# Patient Record
Sex: Female | Born: 1955 | Race: White | Hispanic: No | Smoking: Never smoker
Health system: Southern US, Community
[De-identification: ages and names within clinical notes are randomized; demographics above are authoritative.]

## PROBLEM LIST (undated history)

## (undated) DIAGNOSIS — K219 Gastro-esophageal reflux disease without esophagitis: Secondary | ICD-10-CM

## (undated) DIAGNOSIS — Z8619 Personal history of other infectious and parasitic diseases: Secondary | ICD-10-CM

## (undated) DIAGNOSIS — R000 Tachycardia, unspecified: Secondary | ICD-10-CM

## (undated) DIAGNOSIS — J45909 Unspecified asthma, uncomplicated: Secondary | ICD-10-CM

## (undated) DIAGNOSIS — K449 Diaphragmatic hernia without obstruction or gangrene: Secondary | ICD-10-CM

## (undated) DIAGNOSIS — E079 Disorder of thyroid, unspecified: Secondary | ICD-10-CM

## (undated) DIAGNOSIS — B269 Mumps without complication: Secondary | ICD-10-CM

## (undated) DIAGNOSIS — I471 Supraventricular tachycardia, unspecified: Secondary | ICD-10-CM

## (undated) DIAGNOSIS — G2581 Restless legs syndrome: Secondary | ICD-10-CM

## (undated) DIAGNOSIS — E663 Overweight: Secondary | ICD-10-CM

## (undated) DIAGNOSIS — F419 Anxiety disorder, unspecified: Secondary | ICD-10-CM

## (undated) DIAGNOSIS — D649 Anemia, unspecified: Secondary | ICD-10-CM

## (undated) DIAGNOSIS — N816 Rectocele: Secondary | ICD-10-CM

## (undated) DIAGNOSIS — B019 Varicella without complication: Secondary | ICD-10-CM

## (undated) DIAGNOSIS — F32A Depression, unspecified: Secondary | ICD-10-CM

## (undated) DIAGNOSIS — K5790 Diverticulosis of intestine, part unspecified, without perforation or abscess without bleeding: Secondary | ICD-10-CM

## (undated) DIAGNOSIS — E739 Lactose intolerance, unspecified: Secondary | ICD-10-CM

## (undated) DIAGNOSIS — E782 Mixed hyperlipidemia: Secondary | ICD-10-CM

## (undated) DIAGNOSIS — F329 Major depressive disorder, single episode, unspecified: Secondary | ICD-10-CM

## (undated) DIAGNOSIS — R05 Cough: Secondary | ICD-10-CM

## (undated) DIAGNOSIS — A7740 Ehrlichiosis, unspecified: Secondary | ICD-10-CM

## (undated) DIAGNOSIS — E559 Vitamin D deficiency, unspecified: Secondary | ICD-10-CM

## (undated) DIAGNOSIS — K8020 Calculus of gallbladder without cholecystitis without obstruction: Secondary | ICD-10-CM

## (undated) DIAGNOSIS — G4700 Insomnia, unspecified: Secondary | ICD-10-CM

## (undated) DIAGNOSIS — C4492 Squamous cell carcinoma of skin, unspecified: Secondary | ICD-10-CM

## (undated) DIAGNOSIS — K635 Polyp of colon: Secondary | ICD-10-CM

## (undated) DIAGNOSIS — L659 Nonscarring hair loss, unspecified: Secondary | ICD-10-CM

## (undated) DIAGNOSIS — F4323 Adjustment disorder with mixed anxiety and depressed mood: Secondary | ICD-10-CM

## (undated) DIAGNOSIS — F418 Other specified anxiety disorders: Secondary | ICD-10-CM

## (undated) HISTORY — DX: Anxiety disorder, unspecified: F41.9

## (undated) HISTORY — DX: Nonscarring hair loss, unspecified: L65.9

## (undated) HISTORY — DX: Calculus of gallbladder without cholecystitis without obstruction: K80.20

## (undated) HISTORY — DX: Depression, unspecified: F32.A

## (undated) HISTORY — PX: SKIN SURGERY: SHX2413

## (undated) HISTORY — DX: Cough: R05

## (undated) HISTORY — DX: Diaphragmatic hernia without obstruction or gangrene: K44.9

## (undated) HISTORY — DX: Diverticulosis of intestine, part unspecified, without perforation or abscess without bleeding: K57.90

## (undated) HISTORY — DX: Gastro-esophageal reflux disease without esophagitis: K21.9

## (undated) HISTORY — DX: Mumps without complication: B26.9

## (undated) HISTORY — DX: Lactose intolerance, unspecified: E73.9

## (undated) HISTORY — DX: Anemia, unspecified: D64.9

## (undated) HISTORY — DX: Unspecified asthma, uncomplicated: J45.909

## (undated) HISTORY — DX: Squamous cell carcinoma of skin, unspecified: C44.92

## (undated) HISTORY — DX: Restless legs syndrome: G25.81

## (undated) HISTORY — DX: Adjustment disorder with mixed anxiety and depressed mood: F43.23

## (undated) HISTORY — DX: Personal history of other infectious and parasitic diseases: Z86.19

## (undated) HISTORY — PX: OTHER SURGICAL HISTORY: SHX169

## (undated) HISTORY — DX: Varicella without complication: B01.9

## (undated) HISTORY — DX: Overweight: E66.3

## (undated) HISTORY — DX: Ehrlichiosis, unspecified: A77.40

## (undated) HISTORY — PX: APPENDECTOMY: SHX54

## (undated) HISTORY — DX: Other specified anxiety disorders: F41.8

## (undated) HISTORY — DX: Polyp of colon: K63.5

## (undated) HISTORY — DX: Rectocele: N81.6

## (undated) HISTORY — PX: WISDOM TOOTH EXTRACTION: SHX21

## (undated) HISTORY — PX: INCONTINENCE SURGERY: SHX676

## (undated) HISTORY — DX: Insomnia, unspecified: G47.00

## (undated) HISTORY — DX: Major depressive disorder, single episode, unspecified: F32.9

## (undated) HISTORY — DX: Tachycardia, unspecified: R00.0

## (undated) HISTORY — DX: Disorder of thyroid, unspecified: E07.9

## (undated) HISTORY — DX: Mixed hyperlipidemia: E78.2

## (undated) HISTORY — PX: CHOLECYSTECTOMY: SHX55

## (undated) HISTORY — DX: Vitamin D deficiency, unspecified: E55.9

---
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
## 2004-12-04 HISTORY — PX: ESOPHAGOGASTRODUODENOSCOPY: SHX1529

## 2004-12-04 HISTORY — PX: COLONOSCOPY: SHX174

## 2005-02-28 ENCOUNTER — Other Ambulatory Visit: Admission: RE | Admit: 2005-02-28 | Discharge: 2005-02-28 | Payer: Self-pay | Admitting: Obstetrics and Gynecology

## 2005-12-04 DIAGNOSIS — K449 Diaphragmatic hernia without obstruction or gangrene: Secondary | ICD-10-CM

## 2005-12-04 HISTORY — DX: Diaphragmatic hernia without obstruction or gangrene: K44.9

## 2005-12-04 LAB — HM COLONOSCOPY

## 2006-03-22 ENCOUNTER — Encounter (INDEPENDENT_AMBULATORY_CARE_PROVIDER_SITE_OTHER): Payer: Self-pay | Admitting: *Deleted

## 2006-03-22 ENCOUNTER — Ambulatory Visit (HOSPITAL_BASED_OUTPATIENT_CLINIC_OR_DEPARTMENT_OTHER): Admission: RE | Admit: 2006-03-22 | Discharge: 2006-03-22 | Payer: Self-pay | Admitting: Orthopedic Surgery

## 2006-03-28 ENCOUNTER — Other Ambulatory Visit: Admission: RE | Admit: 2006-03-28 | Discharge: 2006-03-28 | Payer: Self-pay | Admitting: Obstetrics and Gynecology

## 2006-04-09 ENCOUNTER — Ambulatory Visit: Payer: Self-pay | Admitting: Internal Medicine

## 2006-04-18 ENCOUNTER — Ambulatory Visit (HOSPITAL_COMMUNITY): Payer: Self-pay | Admitting: Oncology

## 2006-04-18 ENCOUNTER — Encounter: Admission: RE | Admit: 2006-04-18 | Discharge: 2006-04-18 | Payer: Self-pay | Admitting: Oncology

## 2006-05-04 ENCOUNTER — Ambulatory Visit: Payer: Self-pay | Admitting: Internal Medicine

## 2006-05-04 ENCOUNTER — Observation Stay (HOSPITAL_COMMUNITY): Admission: AD | Admit: 2006-05-04 | Discharge: 2006-05-04 | Payer: Self-pay | Admitting: Internal Medicine

## 2006-05-28 ENCOUNTER — Ambulatory Visit: Payer: Self-pay | Admitting: Internal Medicine

## 2006-06-20 ENCOUNTER — Encounter: Admission: RE | Admit: 2006-06-20 | Discharge: 2006-06-20 | Payer: Self-pay | Admitting: Oncology

## 2006-07-30 ENCOUNTER — Observation Stay (HOSPITAL_COMMUNITY): Admission: EM | Admit: 2006-07-30 | Discharge: 2006-07-30 | Payer: Self-pay | Admitting: Emergency Medicine

## 2006-09-26 ENCOUNTER — Ambulatory Visit: Payer: Self-pay | Admitting: Internal Medicine

## 2006-11-30 ENCOUNTER — Encounter (INDEPENDENT_AMBULATORY_CARE_PROVIDER_SITE_OTHER): Payer: Self-pay | Admitting: *Deleted

## 2006-11-30 ENCOUNTER — Ambulatory Visit (HOSPITAL_COMMUNITY): Admission: RE | Admit: 2006-11-30 | Discharge: 2006-11-30 | Payer: Self-pay | Admitting: Obstetrics and Gynecology

## 2006-12-26 ENCOUNTER — Encounter (HOSPITAL_COMMUNITY): Admission: RE | Admit: 2006-12-26 | Discharge: 2007-01-25 | Payer: Self-pay | Admitting: Oncology

## 2006-12-26 ENCOUNTER — Ambulatory Visit (HOSPITAL_COMMUNITY): Payer: Self-pay | Admitting: Oncology

## 2007-07-23 ENCOUNTER — Encounter: Admission: RE | Admit: 2007-07-23 | Discharge: 2007-07-23 | Payer: Self-pay | Admitting: Internal Medicine

## 2007-12-16 ENCOUNTER — Encounter: Admission: RE | Admit: 2007-12-16 | Discharge: 2007-12-16 | Payer: Self-pay | Admitting: Rheumatology

## 2008-10-12 ENCOUNTER — Ambulatory Visit: Payer: Self-pay | Admitting: Internal Medicine

## 2008-11-03 HISTORY — PX: ABDOMINAL HYSTERECTOMY: SHX81

## 2008-11-10 ENCOUNTER — Ambulatory Visit (HOSPITAL_COMMUNITY): Admission: RE | Admit: 2008-11-10 | Discharge: 2008-11-11 | Payer: Self-pay | Admitting: Obstetrics and Gynecology

## 2008-11-10 ENCOUNTER — Encounter (INDEPENDENT_AMBULATORY_CARE_PROVIDER_SITE_OTHER): Payer: Self-pay | Admitting: Obstetrics and Gynecology

## 2008-11-19 ENCOUNTER — Telehealth (INDEPENDENT_AMBULATORY_CARE_PROVIDER_SITE_OTHER): Payer: Self-pay | Admitting: *Deleted

## 2008-11-25 ENCOUNTER — Encounter: Payer: Self-pay | Admitting: Pulmonary Disease

## 2009-02-04 ENCOUNTER — Ambulatory Visit: Payer: Self-pay | Admitting: Pulmonary Disease

## 2009-02-04 DIAGNOSIS — G2581 Restless legs syndrome: Secondary | ICD-10-CM | POA: Insufficient documentation

## 2009-02-04 DIAGNOSIS — G4710 Hypersomnia, unspecified: Secondary | ICD-10-CM | POA: Insufficient documentation

## 2009-02-04 DIAGNOSIS — G4726 Circadian rhythm sleep disorder, shift work type: Secondary | ICD-10-CM | POA: Insufficient documentation

## 2009-02-09 LAB — CONVERTED CEMR LAB
ALT: 18 units/L (ref 0–35)
AST: 19 units/L (ref 0–37)
Bilirubin, Direct: 0.1 mg/dL (ref 0.0–0.3)
CO2: 28 meq/L (ref 19–32)
Calcium: 9.4 mg/dL (ref 8.4–10.5)
Chloride: 107 meq/L (ref 96–112)
Eosinophils Relative: 1.1 % (ref 0.0–5.0)
Ferritin: 44.2 ng/mL (ref 10.0–291.0)
Folate: 8.6 ng/mL
GFR calc non Af Amer: 112 mL/min
Iron: 64 ug/dL (ref 42–145)
Monocytes Relative: 4.3 % (ref 3.0–12.0)
Neutrophils Relative %: 51.2 % (ref 43.0–77.0)
Platelets: 271 10*3/uL (ref 150–400)
RDW: 12.3 % (ref 11.5–14.6)
Sodium: 142 meq/L (ref 135–145)
Total Bilirubin: 0.7 mg/dL (ref 0.3–1.2)
Transferrin: 265.3 mg/dL (ref 212.0–360.0)
Vitamin B-12: 428 pg/mL (ref 211–911)
WBC: 7.8 10*3/uL (ref 4.5–10.5)

## 2009-02-10 ENCOUNTER — Encounter: Payer: Self-pay | Admitting: Pulmonary Disease

## 2009-02-10 ENCOUNTER — Ambulatory Visit (HOSPITAL_BASED_OUTPATIENT_CLINIC_OR_DEPARTMENT_OTHER): Admission: RE | Admit: 2009-02-10 | Discharge: 2009-02-10 | Payer: Self-pay | Admitting: Pulmonary Disease

## 2009-02-24 ENCOUNTER — Ambulatory Visit: Payer: Self-pay | Admitting: Pulmonary Disease

## 2009-03-16 ENCOUNTER — Telehealth (INDEPENDENT_AMBULATORY_CARE_PROVIDER_SITE_OTHER): Payer: Self-pay | Admitting: *Deleted

## 2011-04-18 NOTE — Procedures (Signed)
NAME:  [name] Jensen, KEERAN NO.:  [id number]   MEDICAL RECORD NO.:  [id number]          PATIENT TYPE:  OUT   LOCATION:  SLEEP CENTER                 FACILITY:  [facility]   PHYSICIAN:  Coralyn Helling, MD        DATE OF BIRTH:  11-Jan-1956   DATE OF STUDY:  02/10/2009                            NOCTURNAL POLYSOMNOGRAM   REFERRING PHYSICIAN:  Coralyn Helling, MD   INDICATIONS:  [name] is a 55 year old female who has a history of  sleep disruption, excessive daytime sleepiness, and restless leg  syndrome.  She is referred to the sleep lab for evaluation of  hypersomnia with obstructive sleep apnea.   Height is 5 feet 5 inches, weight is 157 pounds, BMI is 26, and neck  size is 13.5 inches.   MEDICATIONS:  Restoril, Klonopin, nadolol, Estrace, vitamin D, fish oil,  VESIcare, and prenatal vitamins.  The patient took a Restoril, Klonopin,  nadolol, and Estrace on the night of the study at 8 o'clock.   EPWORTH SCORE:  10.   SLEEP ARCHITECTURE:  Total recording time is 377 minutes.  Total sleep  time is 331 minutes.  Sleep efficiency was 88%.  Sleep latency is 27  minutes.  REM latency is 134 minutes.  The study was notable for lack of  slow wave sleep and a reduction in the percentage of REM sleep.  The  patient slept in both supine and nonsupine positions.   RESPIRATORY DATA:  The average respiratory rate was 14.  Occasional  snoring was noted by the technician.  The overall apnea-hypopnea index  was 1.3.  The respiratory disturbance index was 5.1.  There was 1  central apneic event.  The remainder event were obstructive in nature.  The REM apnea-hypopnea index was 5.  The non-REM apnea-hypopnea index  was 1.  The supine apnea-hypopnea index was 0.6.  The non-supine apnea-  hypopnea index was 1.6.   OXYGEN DATA:  The baseline oxygenation was 90%.  The oxygen saturation  nadir was 89%.  The patient spent a total of 0.7 minutes with an oxygen  saturation less than 90%.   CARDIAC DATA:  The average heart rate was 79 and the rhythm strip showed  normal sinus rhythm with occasional PVCs.   MOVEMENT/PARASOMNIA:  The periodic limb movement index was 0.7.  The  patient had no resting trips.   IMPRESSION:  This study shows evidence for mild sleep-disordered  breathing with a respiratory disturbance index of 5.1.  This did appear  to be somewhat more prominent during REM sleep.   What I would recommend is that the patient to be counseled with regard  to the importance of diet, exercise, and weight reduction.  If these  interventions were unsuccessful, then consideration could be given to  having her undergo additional therapy for sleep disordered breathing  with either CPAP therapy, oral appliance or surgical intervention.   She had a increase in the spindle densities seen on the EEG and this may  be related to her medication use.   If the patient continues to have difficulties with excessive daytime  sleepiness, then consideration may need to be given to having  a return  to the sleep lab to undergo a multiple sleep latency test.      Coralyn Helling, MD  Diplomat, American Board of Sleep Medicine  Electronically Signed     VS/MEDQ  D:  02/24/2009 11:17:06  T:  02/25/2009 01:31:25  Job:  [id number]

## 2011-04-18 NOTE — Op Note (Signed)
[name] Jensen, [name] Jensen           ACCOUNT NO.:  [id number]   MEDICAL RECORD NO.:  [id number]          PATIENT TYPE:  OIB   LOCATION:  9315                          FACILITY:  [facility]   PHYSICIAN:  Martina Sinner, MD DATE OF BIRTH:  09-16-1956   DATE OF PROCEDURE:  DATE OF DISCHARGE:                               OPERATIVE REPORT   PREOPERATIVE DIAGNOSES:  1. Uterine prolapse.  2. Mild cystocele.  3. Rectocele.  4. Stress urinary incontinence.   POSTOPERATIVE DIAGNOSES:  1. Uterine prolapse.  2. Mild cystocele.  3. Rectocele.  4. Stress urinary incontinence.   PROCEDURE:  1. Primary surgeon; Martina Sinner, MD, on sling cystourethropexy      plus cystoscopy; assistant, Stann Mainland. Vincente Poli, MD  2. Laparoscopic-assisted transvaginal hysterectomy; primary surgeon,      Marcelino Duster L. Vincente Poli, MD; I did not assist.  3. Rectocele repair; Marcelle Overlie, MD, primary surgeon; assistant,      Alfredo Martinez, MD   [name] underwent a laparoscopically assisted transvaginal  hysterectomy by Dr. Marcelle Overlie.  Under anesthesia, her cuff was  quite well supported, and there was a mild grade 2 cystocele.  After the  uterus was removed, which was rather bulky, it did not surprise me that  she had very minimal defect.  There was no question she has elasticity  to her tissues, but she did not have a central defect to imbricate.  We  therefore closed the cuff, and Dr. Vincente Poli did a culdoplasty, which even  reduced any potential cystocele further anteriorly.   Following this, I emptied the bladder.  I made two 1-cm incisions 1.5 cm  lateral to the midline in the suprapubic area.  They were made one  fingerbreadth above the symphysis pubis.  I made a 2-cm incision  overlying the mid urethra after instilling a lidocaine-epinephrine  mixture.  I made a thick flap and dissected sharply to urethrovesical  angle bilaterally.   With the bladder emptying, I passed the [facility]  needle on top of along the  back of symphysis pubis, parallel to the midline and onto the pulp of my  index finger.  I then cystoscoped the patient.  There was no entrance  into the bladder.  There was no deflection of the bladder with movement  of the needles.  There was no injury to the urethra.  There was  excellent efflux bilaterally.   The [facility] sling was attached and with the bladder emptied brought up to  the retropubic space.  I tensioned over the fat part of the Kelly clamp  using my usual technique.  I cut below the blue dot to irrigate the  sheaths and removed the sheaths.  I double-checked and was very happy  with the position and tension of the [facility] sling.  I closed the vaginal  wall incision with running 2-0 Vicryl on a CT1 needle.  I cut the sling  below the skin and closed the skin with 4-0 Vicryl.   Following this once again, there was excellent support anteriorly.  She  did not require cystocele repair.  The rectocele repair was  then  performed and will be dictated by Dr. Marcelle Overlie.  I assisted in  this.  Vaginal pack was inserted.  Leg position was good.           ______________________________  Martina Sinner, MD  Electronically Signed     SAM/MEDQ  D:  11/10/2008  T:  11/10/2008  Job:  [id number]

## 2011-04-18 NOTE — H&P (Signed)
[name] Jensen, [name] Jensen           ACCOUNT NO.:  [id number]   MEDICAL RECORD NO.:  [id number]          PATIENT TYPE:   LOCATION:                                FACILITY:  [facility]   PHYSICIAN:  Michelle L. Grewal, M.D.DATE OF BIRTH:  1956-03-29   DATE OF ADMISSION:  11/10/2008  DATE OF DISCHARGE:                              HISTORY & PHYSICAL   This is for a procedure on November 10, 2008 at [facility] at 7:30  in the morning.   HISTORY OF PRESENT ILLNESS:  This patient is a 55 year old gravida 0  para 2, who presents for LAVH, BSO and anterior and posterior repair.  Dr. McDiarmid is going to perform anterior repair and a vaginal sling.  She complains of past symptomatic cystocele and rectocele if she  increases pressure.  She has a history of endometrial ablation.   On exam, the patient was noted to have pelvic relaxation and  subsequently is here for surgery.  She also reports splinting with bowel  movements.   Medical history is unremarkable.   SURGICAL HISTORY:  She had tonsillectomy and adenoidectomy in 1972,  appendectomy and uterine ablation in 2007, and ablation for SVT in 2007.   Medications include;  1. Nadolol 20 mg daily.  2. Restoril 30 mg at bedtime.  3. Clonazepam 1.5 mg at bedtime.  4. Vitamin D 1000 units b.i.d.   She is allergic to IVP DYE.   SOCIAL HISTORY:  She is not a smoker.  She denies alcohol.   Review of systems is positive for fatigue, insomnia and hot flashes.   On exam, height 5 feet 4-3/4 inches, weight 171.2, and BP 122/80.  Lungs  clear to auscultation bilaterally.  Cardiac regular rate and rhythm.  Breast soft, nontender, no masses.  Pelvic exam, external genitalia  within normal limits.  Vagina, she has a grade 2 to 3 cystocele, grade 2  to 3 cervical/uterine prolapse, grade 2 to 3 rectocele.  Uterus is  small.   IMPRESSION:  Pelvic relaxation.   PLAN:  We will proceed with LAVH, BSO and anterior and posterior repair.  Dr.  McDiarmid will perform anterior repair with pubovaginal sling.  Risk  and benefits were reviewed with the patient.      Michelle L. Vincente Poli, M.D.  Electronically Signed     MLG/MEDQ  D:  11/09/2008  T:  11/10/2008  Job:  [id number]

## 2011-04-18 NOTE — Op Note (Signed)
[name] Jensen, [name] Jensen           ACCOUNT NO.:  [id number]   MEDICAL RECORD NO.:  [id number]          PATIENT TYPE:  OIB   LOCATION:  9315                          FACILITY:  [facility]   PHYSICIAN:  Michelle L. Grewal, M.D.DATE OF BIRTH:  26-Jan-1956   DATE OF PROCEDURE:  11/10/2008  DATE OF DISCHARGE:                               OPERATIVE REPORT   PREOPERATIVE DIAGNOSES:  Pelvic relaxation with uterine prolapse and  symptomatic rectocele.   POSTOPERATIVE DIAGNOSES:  Pelvic relaxation with uterine prolapse and  symptomatic rectocele.   PROCEDURE:  laparoscopic-assisted vaginal hysterectomy and bilateral  salpingo-oophorectomy, posterior repair, and removal of the skin tag.   SURGEON:  Michelle L. Vincente Poli, MD   ASSISTANTLorin Picket A. MacDiarmid, MD   ANESTHESIA:  General.   FINDINGS:  Grade 2 rectocele and grade 2 uterine prolapse.   SPECIMENS:  Uterus, cervix, tubes, and ovaries sent to Pathology.   ESTIMATED BLOOD LOSS:  500 mL.   COMPLICATIONS:  None.   DRAINS:  Foley catheter and vaginal packing.   DESCRIPTION OF PROCEDURE:  The patient was taken to the operating room.  She was intubated.  She was prepped and draped in the usual sterile  fashion.  Foley catheter was inserted and the uterine manipulator was  inserted.  The patient was noted to have a grade 2 rectocele and a grade  2 uterine prolapse.  Attention was turned to the abdomen.  A small  incision was made with a scalpel down the umbilicus.  The Veress needle  was inserted.  Pneumoperitoneum was performed.  An 11-mm trocar was  inserted and the laparoscope was introduced through the trocar sheath.  The patient was placed in Trendelenburg position.  A 5-mm trocar was  placed directly under suprapubic visualization.  The pelvis was  inspected.  The uterus was appeared to be normal size.  Ovaries and  tubes were normal.  No adhesions or no evidence of endometriosis was  noted.  An atraumatic grasper was used to elevate  the right tube and  ovary.  The ureter was identified in normal location.  Peristalsis was  noted.  The MCL instrument was placed just beneath the infundibular  pelvic ligament and carried down to the round ligament.  This was done  with good hemostasis.  Careful attention was used to note that the  ureter was well away from our clamps.  This was done on the left side in  identical fashion.  Bilateral ureteral peristalsis was noted after we  did this.  We then converted vaginally.  At this point, Dr. Sherron Monday  scrubbed in as my assistant.  A weighted speculum was placed in the  vagina.  A circumferential incision was made around the cervix.  The  posterior cul-de-sac was entered sharply using Mayo scissors and the  anterior cul-de-sac was entered sharply using Metzenbaum scissors.  The  uterosacral cardinal ligaments were clamped on either side.  Each  pedicle was clamped and suture ligated using 0-Vicryl suture.  We walked  our way up the broad ligament staying close beside the cervix and the  uterus.  Each pedicle was clamped  and suture ligated using 0-Vicryl  suture.  Once I reached the level of the fundus, the uterus was  retroflexed, the remainder of the broad ligament was clamped on either  side using curved Heaney clamps, and they were suture ligated using 0-  Vicryl suture.  The specimen was removed.  There was one small bleeder  near the left side of the vaginal cuff in the 3 o'clock position around  right-angle clamp, which was used to elevate the bleeder and it was  secured with a free tie of 0-Vicryl suture.  The vaginal cuff was closed  from 3-9 o'clock in a running-locked stitch.  A McCall culdoplasty was  performed using 0-Vicryl suture.  The remainder of the cuff was closed  anterior to posterior using 0-Vicryl running-locked stitch.  Hemostasis  was excellent.  At this point, Dr. Sherron Monday and I switched places.  He  examined the patient and felt she did not need  anterior repair because  she had excellent suspension of her vaginal cuff.  He performed a  pubovaginal sling and cystoscopy and that will be dictated by him.  At  the conclusion of this, we then changed and I became the primary surgeon  again.  The patient had a symptomatic grade 2 rectocele.  There was a  small skin tag that the patient wanted to have removed near the right  side of her rectal opening about 2 cm away from the rectum.  I did  remove that with Metzenbaum, as it appeared to be just a small benign  skin tag.  The Allis clamps were placed at the introitus at 5 and 7  o'clock.  A V-shaped incision was made with a scalpel.  Using Strully  scissors, I made a midline incision at the posterior wall of the vagina.  The rectocele was reduced using sharp and blunt dissection.  An 0-Vicryl  suture was used to reapproximate the rectovaginal fascia in the midline  using interrupted.  The redundant vaginal epithelium was trimmed and  then the vaginal epithelium was closed using 0-Vicryl in a running-  locked stitch.  The perineum was then closed using same 0-Vicryl.  At  this point, vaginal packing with Estrace cream was inserted into the  vagina.  I then changed my gloves and went back up into the abdomen and  irrigated the pelvis.  There was one small area of oozing near the right  side of the vaginal cuff, which then was hemostatic after I used  Kleppingers and cauterized the area.  I observed this area under low-  intraperitoneal pressure.  Interceed was placed at the vaginal cuff  hopefully to help with minimizing adhesion formation in the future.  The  pneumoperitoneum was released.  The incisions were closed using  Dermabond skin adhesive.  All sponge, lap, and instrument counts were  correct x2.  The patient went to the recovery room in excellent  condition.      Michelle L. Vincente Poli, M.D.  Electronically Signed     MLG/MEDQ  D:  11/10/2008  T:  11/10/2008  Job:  [id number]

## 2011-04-18 NOTE — Assessment & Plan Note (Signed)
[facility]                         ELECTROPHYSIOLOGY OFFICE NOTE   AVONNA, IRIBE                  MRN:          [id number]  DATE:10/12/2008                            DOB:          March 13, 1956    HISTORY OF PRESENT ILLNESS:  Ms. [name] Jensen returns today for followup.  She is a very pleasant middle-aged woman with a history of symptomatic  SVT, status post ablation as well as hypertension and noncardiac chest  pain who returns today for followup.  The patient had recurrent  palpitations following her ablation, which was found to be sinus  tachycardia.  She has been otherwise stable and I have not had to see  her for 2 years.  She continues to be very active and walks on a daily  basis.  She has pending hysterectomy in the next several weeks and is  here today for preoperative evaluation.  She also notes that she has had  some increase snoring according to her daughter and there is a question  of sleep apnea.   PHYSICAL EXAMINATION:  VITAL SIGNS:  Her blood pressure is 140/90, the  pulse 103 and regular, respirations were 18, and the weight was 171  pounds NECK:  Revealed no jugular venous distention.  LUNGS:  Clear bilaterally to auscultation.  No wheezes, rales, or  rhonchi were present.  CARDIOVASCULAR:  Revealed regular rate and rhythm.  Normal S1 and S2.  EXTREMITIES:  Demonstrated no edema.  ABDOMEN:  Soft and nontender.   EKG demonstrates sinus tachycardia at 103 beats per minute.  This is not  appreciably different from prior ECGs.   IMPRESSION:  1. Supraventricular tachycardia status post ablation.  2. Hypertension, now well-controlled.  3. 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   DISCUSSION:  Overall, Ms. Barman is stable.  Her palpitations are well  controlled.  At the present time she would be considered at low risk for  major cardiovascular complications from pending  hysterectomy.     Doylene Canning. Ladona Ridgel, MD  Electronically Signed    GWT/MedQ  DD: 10/12/2008  DT: 10/13/2008  Job #: [id number]   cc:   Marcelino Duster L. Vincente Poli, M.D.

## 2011-04-21 NOTE — Op Note (Signed)
[name] Jensen, [name] Jensen           ACCOUNT NO.:  [id number]   MEDICAL RECORD NO.:  [id number]          PATIENT TYPE:  AMB   LOCATION:  SDC                           FACILITY:  [facility]   PHYSICIAN:  Michelle L. Grewal, M.D.DATE OF BIRTH:  06-05-1956   DATE OF PROCEDURE:  11/30/2006  DATE OF DISCHARGE:                               OPERATIVE REPORT   PREOPERATIVE DIAGNOSIS:  Menorrhagia, endometrial polyp.   POSTOPERATIVE DIAGNOSIS:  Menorrhagia, endometrial polyp.   PROCEDURE:  Dilatation and curettage, diagnostic hysteroscopy,  ThermaChoice endometrial ablation.   SURGEON:  Dr. Vincente Poli   ANESTHESIA:  MAC with local.   SPECIMENS:  Uterine curettings.   ESTIMATED BLOOD LOSS:  Was minimal.   COMPLICATIONS:  None.   PROCEDURE:  The patient was taken to the operating room. She was given  anesthesia.  She was placed in the lithotomy position.  She was prepped  and draped in the usual sterile fashion.  In-and-out catheter was used  to empty the bladder.  The speculum was inserted to the vagina and  cervix was grasped with tenaculum.  Paracervical block was performed in  standard fashion. Cervical internal os was gently dilated with Shawnie Pons  dilators.  The diagnostic hysteroscope was inserted with excellent  visualization the entire endometrium. Endometrial cavity was visualized.  There is some polypoid areas emanating from the posterior wall of the  uterus.  The hysteroscope was removed and sharp curette was inserted and  the uterus was thoroughly curetted of all tissue which appeared to be  polypoid in nature.  It was sent to pathology.  The hysteroscope was  inserted back into the uterus and the uterine cavity was clean.  The  hysteroscope was removed.  A ThermaChoice III was inserted and a  ThermaChoice endometrial ablation was performed for 8 minutes according  to the manufacturer's specifications. At the end of the procedure the  intact balloon was removed. All sponge, lap and  instrument counts were  correct x2.  There was minimal bleeding noted.  The patient is given  Toradol and went to recovery room in stable condition.      Michelle L. Vincente Poli, M.D.  Electronically Signed     MLG/MEDQ  D:  11/30/2006  T:  11/30/2006  Job:  [id number]

## 2011-04-21 NOTE — Discharge Summary (Signed)
[name], [name] NO.:  [id number]   MEDICAL RECORD NO.:  [id number]          PATIENT TYPE:  INP   LOCATION:  2023                         FACILITY:  [facility]   PHYSICIAN:  Doylene Canning. Ladona Ridgel, M.D.  DATE OF BIRTH:  [birth year]   DATE OF ADMISSION:  05/04/2006  DATE OF DISCHARGE:  05/04/2006                                 DISCHARGE SUMMARY   ALLERGIES:  IV DYE, which has caused in the past anaphylaxis.   PRINCIPAL DIAGNOSES:  1.  Discharging day of electrophysiology study radiofrequency catheter      ablation of atrioventricular nodal reentry tachycardia.  2.  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      1.  Increased frequency and symptomatology in the last few months.      2.  Sudden onset/chest pain/shortness of breath/anxiety/presyncope.  3.  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  4.  Stress study June 2006:  No ischemia, ejection fraction 64%.   SECONDARY DIAGNOSES:  1.  History of anemia.  2.  History of skin biopsies.  3.  Status post appendectomy.   PROCEDURE:  May 04, 2006:  Electrophysiology study radiofrequency catheter  ablation of easily-inducible AV nodal reentry tachycardia, successful slow P-  wave modification with single radiofrequency catheter ablation.  The patient  observed 30 minutes without recurrence of SVT.   BRIEF HISTORY:  [name] Jensen is a 55 year old female.  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   She has history of tachypalpitations that have begun when she was in her  late 96s.  Over the last couple of years her episodes have become  increasingly frequent and last about an hour.  They typically start and stop  suddenly.  In the past she has had Cardizem to terminate them.   The patient notes that when she goes into supraventricular tachycardia she  has chest pressure, shortness of breath, and feels somewhat anxious.   She  also notes near-syncope but has not had frank syncope.   The patient has alopecia with beta blockers, especially marked with atenolol  which was discontinued.  She is presently on Cardizem and has added back her  Toprol-XL due to increased frequency and marked symptomatology with these  episodes.   HOSPITAL COURSE:  The patient entered [facility] electively May 04, 2006.  She underwent successful electrophysiology study radiofrequency  catheter ablation of her AV nodal reentry tachycardia.  She has had no post  procedural complications and wishes to discharge the same day.  This can be  done.  She will discharge at about 1900 hours this evening.  Her medications  have been modified.  She will stop taking diltiazem.  She continues on  Toprol-XL 100 mg daily but she will call the office in the next 2-3 weeks in  an effort to have the dosage reduced even further.  She also takes a  Restoril 30 mg at bedtime and Tandem Plus twice daily.  She is to start  enteric-coated aspirin 325 mg  daily for a 6-week period.  She is asked not  to drive for the next 2 days, not to lift anything heavier than 10 pounds  for the next 2 weeks.  Discharge diet is a low sodium and low cholesterol  diet.  If she plans dental work, even teeth cleaning, up through October  2007 she is to call [phone] for antibiotic coverage.  She has followup at  [facility], [address] Thursday, May 31, 2006, at  10:30 a.m. to see Dr. Ladona Ridgel.   LABORATORY STUDIES THIS ADMISSION:  Complete blood count:  White cells 7.5,  hemoglobin 11.8, hematocrit 35.9, platelets of 331.  The PT is 13.7, INR  1.0, PTT is 28.  Serum electrolytes:  Sodium 138, potassium 3.9, chloride  106, carbonate 26, glucose 105, BUN is 10, creatinine is 0.8.      [name] Jensen, P.A.    ______________________________  Doylene Canning. Ladona Ridgel, M.D.    GM/MEDQ  D:  05/04/2006  T:  05/05/2006  Job:  [id number]   cc:    [facility]

## 2011-04-21 NOTE — Op Note (Signed)
NAMEZEPHYR, SAUSEDO           ACCOUNT NO.:  [id number]   MEDICAL RECORD NO.:  [id number]          PATIENT TYPE:  AMB   LOCATION:  DSC                          FACILITY:  [facility]   PHYSICIAN:  Katy Fitch. Sypher, M.D. DATE OF BIRTH:  1956-03-15   DATE OF PROCEDURE:  03/22/2006  DATE OF DISCHARGE:                                 OPERATIVE REPORT   PREOPERATIVE DIAGNOSES:  1.  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  2.  Three-millimeter-wide melanotic streak, left small toenail, lateral      aspect.   POSTOPERATIVE DIAGNOSES:  1.  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  2.  Three-millimeter-wide melanotic streak, left small toenail, lateral      aspect.   OPERATION:  1.  En bloc resection of right long finger dorsal intermediate and ventral      nail fold for biopsy of melanotic lesion.  2.  Lateral flap slide reconstruction of right long finger nail, that is,      adjacent tissue transfer and reconstruction of right long finger nail.  3.  Resection of lateral 40% of left small toenail including nail plate,      dorsal intermediate and ventral nail fold for en bloc biopsy of      melanotic nail lesion, left small toe.   OPERATING SURGEON:  Katy Fitch. Sypher, M.D.   ASSISTANT:  Molly Maduro Dasnoit P.A.-C.   ANESTHESIA:  General by LMA.   SUPERVISING ANESTHESIOLOGIST:  Zenon Mayo, M.D.   INDICATIONS:  [name] Jensen is a 55 year old woman referred by Dr. Campbell Stall for evaluation of melanotic streaks in her right long fingernail and  left small toe.   While reading the [facility] Journal, she was informed that the new onset  of melanotic streaks with a cause for concern of possible melanoma in the  nail matrix.   Mrs. Brandner identified the development of melanotic streaks in her right  long fingernail and her left  small toe during the past 2 years.  She  consulted Dr. 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  She had no  typical-appearing macule or papule at the nail fold consistent with a deep  melanoma.  She had a similar 3-mm-wide streak in her left small toenail.  There was no macule or papule present in the nail fold.   Dr. Danella Deis recommended a full-thickness biopsy, in that it has been shown  the dermatologic ligature that one cannot discern a benign verses a  malignant melanotic lesion.   Mrs. Sinning was sent for detailed upper extremity orthopedic consult and  requested that we also biopsy her toe.   We agreed to proceed with en bloc resection of her dorsal intermediate and  ventral nail fold of the right long finger and her left small  toe.  We  recommended immediate reconstruction of her long fingernail, given the fact  that should this turn out to be a melanotic lesion, we will need to perform  a wide resection and ablate the nail.   Preoperatively, we a lengthy discussion regarding the likelihood that she  would have a chronic nail deformity of the right long finger.  With  reconstruction, we will hope that this is no more than a thin streak;  however, at times an unsightly ridge or gap in the nail plate can occur,  despite our best efforts at reconstruction, given the width of her biopsy  required.   With respect to the toe as the melanotic lesion involved the lateral 40% of  the toenail, I recommended simple ablation of that portion of the small  toenail.   After informed consent, she is brought to the operating room at this time.   Preoperatively, questions were invited and answered.   PROCEDURE:  Zniyah Midkiff was brought to the operating room and placed in  supine position upon the operating table.   Following the  induction of general anesthesia by LMA technique, the right  hand and arm and left leg and foot were prepped with Betadine soaping  solution and sterilely draped.  A pneumatic tourniquet was applied to the  proximal calf on the left lower extremity and a pneumatic tourniquet was  applied to the proximal right brachium.   Following elevation and exsanguination of the left foot, the arterial  tourniquet on the proximal calf was inflated to 250 mmHg.  The procedure  commenced with tracing of an en bloc resection including the entire dorsal  intermediate and ventral nail fold proximally to level of the distal  interphalangeal joint of the small toe.  In the toe biopsy, we resected the  nail plate along with the matrix the full length of the nail plate.   A wedge resection of the distal toe pulp allowed primary closure of the  defect created.  Care was taken to remove the biopsy specimen as a single  block all the way to the subperiosteal level on the toe distal phalanx.   The specimen was passed off in formalin and the wound margins undermined,  followed by primary repair of the defect utilizing multiple interrupted  mattress sutures of 4-0 nylon.   A cosmetic-appearing small toenail was achieved.   The toe was then dressed with Xeroflo, sterile gauze and a Coban dressing  secured around the ankle.  0.25% Marcaine was infiltrated at the base of the  toe for postoperative analgesia.   The tourniquet was released with immediate capillary refill to all toes.   Attention was then directed to the right upper extremity biopsy.   We changed gloves and used a new set of instruments for the second  procedure.   The right long finger was exsanguinated with a gauze wrap and a half-inch  Penrose drain was used over the proximal phalangeal segment as a digital  tourniquet.  We did not remove the nail plate, but mapped out a full- thickness biopsy of the dorsal intermediate and ventral nail fold  extending  distally approximately 60% the length of the ventral nail fold.  These  incisions were fashioned with about a 0.5-mm margin through normal tissue.   These were taken sharply with a fresh 15 blade and followed distally.  Once  the region of the lesion was outlined and incised to the level of the distal  phalangeal subperiosteal  region, the nail plate was removed with a Market researcher and the distal portion of the ventral nail matrix resected to the  level of the distal phalanx.   An en bloc biopsy of the entire nail fold and ventral nail matrix was  subsequently passed off in formalin and labeled as a proper finger biopsy.   The ulnar aspect of the nail matrix was then mobilized as a lateral slid  flap with a relaxing incision on the ulnar aspect of the fingertip just  above the mid-lateral line and the entire nail mechanism was transposed just  short of 4 mm in a radial direction, followed by primary closure of the  ventral intermediate and dorsal nail fold in layers.  6-0 chromic was  utilized to reconstruct the nail fold.   A cosmetic-appearing nail was achieved.   The nail plate was appropriately thinned, cleaned of all soft tissues and  replaced to prevent scarring of the dorsal and ventral nail folds.   The finger was then dressed with Xeroflo, sterile gauze and Coban.  A 0.25%  Marcaine block was placed at the metacarpal head level for postoperative  analgesia.   Ms. Rewis tolerated surgery and anesthesia well.  After release of the  tourniquet, her foot was elevated above her heart while we performed the  finger biopsy.   Her hand will be elevated postoperatively.   For aftercare, she is provided prescriptions for Percocet 5 mg one or two  tablets p.o. q.4-6 h. p.r.n. pain, 24 tablets without refill.  Also, she is  provided Keflex 500 mg one p.o. q.8 h. x4 days as a prophylactic antibiotic  to prevent postoperative infection.      Katy Fitch Sypher,  M.D.  Electronically Signed     RVS/MEDQ  D:  03/22/2006  T:  03/23/2006  Job:  [id number]   cc:   Hope M. Danella Deis, M.D.  Fax: [phone]

## 2011-04-21 NOTE — Discharge Summary (Signed)
[name] Jensen, [name] Jensen NO.:  [id number]   MEDICAL RECORD NO.:  [id number]          PATIENT TYPE:  INP   LOCATION:  4703                         FACILITY:  [facility]   PHYSICIAN:  Madaline Savage, M.D.DATE OF BIRTH:  12/26/1955   DATE OF ADMISSION:  07/30/2006  DATE OF DISCHARGE:  07/30/2006                                 DISCHARGE SUMMARY   DISCHARGE DIAGNOSES:  1. Chest pain, negative myocardial infarction.      a.     Patent coronary arteries with ejection fraction 55% to 65%.  2. History of paroxysmal supraventricular tachycardia with syncope, status      post ablation.  3. History of IVP DYE ALLERGY.   DISCHARGE CONDITION:  Improved.   PROCEDURES:  July 30, 2006:  Combined left heart cath by Dr. Madaline Savage.   DISCHARGE MEDICATIONS:  Same as admit medicines.  Zetia 10 daily, Restoril  15 h.s. p.r.n., Digitek 0.125 daily, verapamil 240 daily.   DISCHARGE INSTRUCTIONS:  Diet:  As desired.  Activity:  Right groin cath site care.  Follow up with Dr. Elsie Lincoln.   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  It had improved by the 27th but continued chest tightness.  She took  chewable aspirin and felt a little better.  She has no known history of  cardiac disease, electrical yes, but not coronary artery disease, and never  had chest pains such as this previously.  She does have a history of PSVT  with near syncope.  Recent PSVT ablation by Dr. Lewayne Bunting.  She also has  hypertension.   Because of the severe ness of her chest pain and the continued tightness,  she had been seen in the office by Dr. Elsie Lincoln.  Was admitted to [facility] through the ER for a cardiac catheterization on July 30, 2006.   PAST MEDICAL HISTORY:  As stated.   FAMILY HISTORY:  Father died at 78 suddenly.  Mother and brothers are alive  and well.   SOCIAL HISTORY:   REVIEW OF SYSTEMS:  See H&P.   PHYSICAL EXAMINATION:  At discharge, blood pressure 124/64, pulse 54, temp  96.7.  Right groin stable without hematoma.   LABORATORY DATA:  Hemoglobin 12.0, hematocrit 34.8, wbc 11.9, platelets 361,  neutrophils 55, lymphs 39, monos 5, eosinophils 1.  __________  Pro time  15.3, INR of 1.2, PTT 24, sodium 140, potassium 3.0.  Chloride 108, CO2 of  23, glucose 87, BUN 11, creatinine 0.7, calcium 8.8, total protein 6.3,  albumin 3.5, AST 20, ALT 24, ALP 59, total bili 0.9, CK 93, MB 2.4, troponin  I 0.01.   HOSPITAL COURSE:  Patient was admitted from the office for chest pain.  She  was started on Heparin and nitroglycerin, was given Solu-Medrol prior to the  cath to protect her  with her IV dye allergy.  Underwent cardiac cath by Dr.  Elsie Lincoln, was found to have patent course.  She had been admitted to the  hospital but stayed less than 24 hours going from the cath lab to short, I  believe, short-stay center, where she was discharged from later on the same  day.  She was stable and would followup with Dr. Elsie Lincoln and Dr. Ladona Ridgel as  outpatient.      Darcella Gasman. Annie Paras, N.P.    ______________________________  Madaline Savage, M.D.    LRI/MEDQ  D:  09/11/2006  T:  09/12/2006  Job:  [id number]   cc:   Doylene Canning. Ladona Ridgel, MD

## 2011-04-21 NOTE — Cardiovascular Report (Signed)
[name] Jensen, [name] Jensen NO.:  [id number]   MEDICAL RECORD NO.:  [id number]          PATIENT TYPE:  INP   LOCATION:  2864                         FACILITY:  [facility]   PHYSICIAN:  Madaline Savage, M.D.DATE OF BIRTH:  18-Jun-1956   DATE OF PROCEDURE:  07/30/2006  DATE OF DISCHARGE:  07/30/2006                              CARDIAC CATHETERIZATION   PROCEDURES PERFORMED:  1. Selective coronary angiography by Judkins technique.  2. Retrograde left heart catheterization.  3. Left ventricular angiography.  4. Abdominal aortography with nonselective visualization of both renal      arteries.  5. Successful Angio-Seal closure of the right femoral artery.   COMPLICATIONS:  None.   ENTRY SITE:  Right femoral.   DYE USED:  Omnipaque (The patient had a history of DYE allergy dating back  25 years.  She was premedicated with IV hydration and Mucomyst prior to the  procedure.)   PATIENT PROFILE:  The patient is a 55 year old divorced white female who has  had chest pain since yesterday, severe, radiating up into her jaw and  throughout her anterior chest, pressure-like in nature, better after she  took five aspirin.  This morning the pain has not been quite so severe but  there is a residual tightness in the chest and the patient came to work as  she normally did today and complained of these symptoms.  An EKG was done  and showed no evidence of ischemic change and was basically normal.  The  patient has risk factors that include a father who died at age 24  unexpectedly and the patient has a history of some hyperlipidemia.  The  patient was transferred from the [place] office of [facility] to [facility] where she entered as an outpatient  and was ultimately brought to the catheterization laboratory.  This  procedure was performed electively through the right femoral artery without  complications.  Because of the results noted, the  patient will be eligible  for outpatient discharge in approximately 2 hours after she reaches the  holding area.   RESULTS:  Pressures:  The central aortic pressure was 130/70, mean of 100.  Left ventricular pressure 130/4, end-diastolic pressure 14.  No aortic valve  gradient was noted by pullback technique.   ANGIOGRAPHIC RESULTS:  The patient had no visible pericardial, coronary or  valvular calcifications.  The left ventricular angiogram showed normal  contractility of all wall segments.  I estimate ejection fraction at between  55% and 65%.  No mitral regurgitation was seen.  Aortic root looked normal.   Left main coronary artery was medium in length, medium in diameter, and  showed no evidence of any irregularities or stenosis.   LAD coursed to the cardiac apex.  A septal perforator branch arose  proximally adjacent to a medium-size diagonal branch.  There was a second  diagonal branch more distally along the course of the LAD.  No lesions were  seen in the LAD system including septals,  LAD itself, or two diagonals.   Left circumflex coronary artery gave rise to one large obtuse marginal  branch which bifurcated distally.  There was a second, much smaller obtuse  marginal branch and the circumflex itself terminated as a posterolateral  branch.  No lesions were seen throughout the entirety of the left circumflex  coronary arterial tree.   Right coronary artery was large and dominant.  It was entered with an no-  torque catheter and was difficult to intubate with a standard Judkins  catheter or with a multipurpose A2 catheter.  The no-torque catheter did  seem to seek out the vessel quite easily.   The right coronary artery was large and dominant, giving rise to a pulmonary  conus sinus node branch and two acute marginal branches.  The entirety of  this very large RCA was normal including a medium-size posterior descending  branch and a smaller but still sizable posterolateral  branch.   Abdominal aortography showed no evidence of abdominal aorta pathology and  both renal arteries were widely patent.  Abdominal aortography was performed  because of the history of hypertension.  The superior mesenteric artery also  looked fairly normal.  The common iliacs appeared unremarkable.   FINAL DIAGNOSES:  1. Chest pain of a little over 24 hours duration, sounding anginal.  2. History of paroxysmal supraventricular tachycardia requiring SVT      ablation earlier this year by Dr. Lewayne Bunting.  3. History of hypertension.  4. Angiographically patent coronary arteries.  5. Normal left ventricular systolic function.  6. Hypertension with normal renal arteries.   PLAN:  The patient's antihypertensive agent will be up-titrated as needed.  She is reassured about her coronary artery status.  The patient has a  history of reflux and this episode may relate to reflux so a proton pump  inhibitor will be advised.           ______________________________  Madaline Savage, M.D.     WHG/MEDQ  D:  07/30/2006  T:  07/31/2006  Job:  [id number]   cc:   [facility]  Doylene Canning. Ladona Ridgel, MD  Cardiac Cath Lab, [facility]

## 2011-04-21 NOTE — Assessment & Plan Note (Signed)
[name] Jensen                           ELECTROPHYSIOLOGY OFFICE NOTE   VALTA, DILLON                  MRN:          [id number]  DATE:09/26/2006                            DOB:          March 18, 1956    [name] Jensen returns today for follow-up.  She is a very pleasant 55 year old  woman with a history of SVT who underwent electrophysiologic study and  catheter ablation several months ago.  Since then, she has been bothered by  sinus tachycardia and has had to undergo increasing doses of sinus and AV  nodal blocking drugs.  She is now on digoxin 0.125 daily and Verapamil 240  daily.  The patient notes that she has been exercising regularly and as a  consequence, has lost almost 15 pounds.  She states that her palpitations  have improved, although not completely gone.  She would like to decrease  some of her medications.   PHYSICAL EXAMINATION:  GENERAL APPEARANCE:  She is a pleasant, well-  appearing woman in no acute distress.  VITAL SIGNS:  Blood pressure was stable at 134/85, pulse 83 and regular,  respirations 18, weight 165 pounds.  NECK:  No jugular venous distension.  LUNGS:  Clear to auscultation bilaterally.  CARDIOVASCULAR:  Regular rate and rhythm with normal S1 and S2.  There were  no murmurs, rubs, or gallops.  EXTREMITIES:  No clubbing, cyanosis, or edema   The EKG demonstrates sinus rhythm with normal axis and intervals.   IMPRESSION:  1. Supraventricular tachycardia status post catheter ablation.  2. Symptomatic palpitations and sinus tachycardia.  3. Hypertension.   DISCUSSION:  [name] Jensen is actually stable today and is improved since we  last saw her.  Today I have asked that she discontinue her digoxin.  Following this, she will be allowed to decrease her dose of Verapamil and I  have asked that she decrease this on a slow basis, going  from 240 to 180 to 120 daily.  If she is tolerant of all of this, then will  plan  on seeing her back on a p.r.n. basis.  If she has recurrent problems,  then I will be happy to see her after that.            ______________________________  Doylene Canning. Ladona Ridgel, MD   GWT/MedQ DD:  09/26/2006 DT:  09/27/2006 Job #:  [id number]   cc:   Madaline Savage, M.D.

## 2011-04-21 NOTE — Op Note (Signed)
[name] Jensen, CHANDRAN NO.:  [id number]   MEDICAL RECORD NO.:  [id number]          PATIENT TYPE:  INP   LOCATION:  2023                         FACILITY:  [facility]   PHYSICIAN:  Doylene Canning. Ladona Ridgel, M.D.  DATE OF BIRTH:  04-12-1956   DATE OF PROCEDURE:  05/04/2006  DATE OF DISCHARGE:                                 OPERATIVE REPORT   PROCEDURE PERFORMED:  Electrophysiologic study and RF catheter ablation of  AV node reentrant tachycardia.   INTRODUCTION:  The patient is a 55 year old woman with a history of  recurrent tachy palpitations for many years who, despite medical therapy,  had recurrent symptoms.  She had documented SVT at rates of up to 200 beats  per minute.  She is now referred for electrophysiologic study and catheter  ablation.   PROCEDURE:  After informed consent was obtained, the patient was taken  diagnostic EP lab in the fasting state.  After the usual preparation and  draping, intravenous fentanyl and midazolam was given for sedation.  A 6-  [place] hexapolar catheter was inserted percutaneously into the right jugular  vein and advanced to the coronary sinus.  A 5-French quadripolar catheter  was inserted percutaneously in the right femoral vein and advanced to the  His bundle region.  A 5-French quadripolar catheter was inserted  percutaneously in the right femoral vein advanced through RV apex.  A 7-  French quadripolar catheter was inserted percutaneously in the right femoral  vein and advanced to right atrium.  After measuring the basic intervals,  rapid ventricular pacing was carried out from the RV apex and stepwise  decreased down to 330 milliseconds where VA Wenckebach was observed.  During  rapid ventricular pacing, the atrial activation was midline and decremental.  Next, programmed ventricular stimulation was carried out from the RV apex  with a base drive cycle length of 098 milliseconds.  The S1 and S2 interval  was stepwise decreased  down to 260 milliseconds where ventricular  refractoriness was observed.  During programmed ventricular stimulation, the  atrial activation was again midline and decremental.  Next, programmed  atrial stimulation was carried out from the coronary sinus at a base drive  cycle 119 milliseconds.  At S1 and S2 coupling interval of 500/300 there was  inducible SVT.  Mapping was subsequently carried out and this demonstrated  AV node reentrant tachycardia at a cycle length of 370 milliseconds.  This  tachycardia was characterized by a midline atrial activation with the His A  being activated earliest, followed by the CS Prox, followed by the CS mid  and distal.  PVCs were placed the time of His bundle refractoriness and this  did not pre-excite the atrium.  Ventricular pacing demonstrated VAV  conduction sequence.  All the above was diagnostic of AV node reentrant  tachycardia.  Tachycardia could be terminated either with rapid ventricular  pacing at 300 milliseconds or rapid atrial pacing.  At this point, the 7-  [place] quadripolar ablation catheter was maneuvered into the region of  Koch's triangle.  At [facility] triangle site 9 approximately parallel with the  CS os, a  single RF energy application was delivered to this region.  During  RF energy application, there was accelerated junctional rhythm.  Following  the catheter ablation, additional rapid atrial and ventricular pacing was  carried out both from the coronary sinus as well as from the high right  atrium which resulted in no inducible SVT.  There were intermittent echo  beats noted following ablation.  The patient was observed 30 minutes and  during this time had recurrent attempts to re-induce the tachycardia which  demonstrated no inducibility.  The catheter was then removed.  Hemostasis  was assured and the patient was returned to her room in satisfactory  condition.   COMPLICATIONS:  There were no immediate procedure  complications.   RESULTS:  A.  Baseline ECG:  The baseline ECG demonstrates normal sinus  rhythm with normal axis and intervals.  There was no ventricular pre-  excitation noted on the baseline ECG.  B.  Baseline intervals:  Sinus node cycle length was 628 milliseconds, the  AH interval 87 milliseconds, the HV interval 44 milliseconds.  Following  ablation there were no change in the intervals.  C.  Rapid ventricular pacing:  Rapid atrial pacing was carried out from the  RV apex, demonstrating a VA Wenckebach cycle length of 330 milliseconds.  During rapid ventricular pacing, the atrial activation was midline and  decremental and there was no inducible SVT.  D.  Programmed ventricular stimulation:  Programmed ventricular fibrillation  was carried out from the RV apex at base drive cycle length 045  milliseconds.  The S1 and S2 interval was stepwise decreased down to 260  milliseconds where ventricular refractoriness was observed.  During  programmed ventricular stimulation the atrial activation was again midline  and decremental and there was no inducible SVT.  E.  Rapid atrial pacing:  Rapid atrial pacing was carried out from the  coronary sinus as well as the heart atrium at pacing  cycle length of 600  milliseconds and stepwise decreased down to 370 milliseconds where AV  Wenckebach was observed.  This was following catheter ablation.  Prior to  this, rapid atrial pacing resulted in the initiation of SVT at pacing cycle  length of 360 milliseconds.  F.  Programmed atrial stimulation:  Programmed atrial stimulation was  carried out from the coronary sinus as well as the right atrium at base  drive cycle length of 409 milliseconds.  The S1, S2 interval was stepwise  decreased from 440 milliseconds down to 300 milliseconds where SVT was  induced.  Following catheter ablation, additional programmed atrial stimulation was carried out down to 320 milliseconds resulting in the AV  node ERP.   During programmed atrial stimulation following ablation, there  are rare intermittent echo beats but no inducible SVT.  G.  Arrhythmias observed:  1.  AV node reentry tachycardia initiation rapid atrial pacing, programmed      atrial stimulation, and catheter manipulation, the duration was      sustained, cycle length 370 milliseconds, method of termination was with      rapid ventricular pacing.      1.  Mapping:  Mapping of Koch's triangle demonstrated the usual size and          orientation.          1.  RF energy application:  A single RF energy application was              delivered to site 9 and Koch's triangle resulting in accelerated  junctional rhythm.  Following catheter ablation, there was no              residual inducible SVT.   CONCLUSION:  This study demonstrates successful electrophysiology study and  RF catheter ablation of AV node reentrant tachycardia with successful slow  pathway modification of which was carried out in Koch's triangle rendering  the tachycardia not inducible and resulting in very minimal, if any,  residual slow pathway conduction.           ______________________________  Doylene Canning. Ladona Ridgel, M.D.     GWT/MEDQ  D:  05/04/2006  T:  05/04/2006  Job:  [id number]   cc:   Dani Gobble, MD  Fax: [phone]   Madaline Savage, M.D.  Fax: [phone]

## 2011-09-08 LAB — CBC
HCT: 31.4 % — ABNORMAL LOW (ref 36.0–46.0)
Hemoglobin: 10.4 g/dL — ABNORMAL LOW (ref 12.0–15.0)
MCHC: 34.5 g/dL (ref 30.0–36.0)
MCHC: 35.2 g/dL (ref 30.0–36.0)
MCV: 85.7 fL (ref 78.0–100.0)
MCV: 87.2 fL (ref 78.0–100.0)
Platelets: 279 10*3/uL (ref 150–400)
Platelets: 289 10*3/uL (ref 150–400)
RBC: 3.42 MIL/uL — ABNORMAL LOW (ref 3.87–5.11)
RBC: 4.53 MIL/uL (ref 3.87–5.11)
RDW: 12.9 % (ref 11.5–15.5)
WBC: 21.1 10*3/uL — ABNORMAL HIGH (ref 4.0–10.5)
WBC: 8.3 10*3/uL (ref 4.0–10.5)

## 2011-09-08 LAB — COMPREHENSIVE METABOLIC PANEL
AST: 35 U/L (ref 0–37)
Albumin: 3.2 g/dL — ABNORMAL LOW (ref 3.5–5.2)
BUN: 8 mg/dL (ref 6–23)
Calcium: 7.9 mg/dL — ABNORMAL LOW (ref 8.4–10.5)
Chloride: 102 mEq/L (ref 96–112)
Creatinine, Ser: 0.59 mg/dL (ref 0.4–1.2)
GFR calc Af Amer: >60 mL/min (ref >60)
Total Bilirubin: 0.7 mg/dL (ref 0.3–1.2)
Total Protein: 5.8 g/dL — ABNORMAL LOW (ref 6.0–8.3)

## 2011-09-08 LAB — CARDIAC PANEL(CRET KIN+CKTOT+MB+TROPI)
CK, MB: 1.4 ng/mL (ref 0.3–4.0)
Relative Index: INVALID (ref 0.0–2.5)
Total CK: 67 U/L (ref 7–177)

## 2011-09-08 LAB — TYPE AND SCREEN
ABO/RH(D): A POS
Antibody Screen: NEGATIVE

## 2011-09-08 LAB — PROTIME-INR: Prothrombin Time: 13.2 seconds (ref 11.6–15.2)

## 2011-09-08 LAB — APTT: aPTT: 26 seconds (ref 24–37)

## 2011-09-23 ENCOUNTER — Emergency Department (INDEPENDENT_AMBULATORY_CARE_PROVIDER_SITE_OTHER): Payer: No Typology Code available for payment source

## 2011-09-23 ENCOUNTER — Encounter: Payer: Self-pay | Admitting: Emergency Medicine

## 2011-09-23 ENCOUNTER — Emergency Department (HOSPITAL_BASED_OUTPATIENT_CLINIC_OR_DEPARTMENT_OTHER)
Admission: EM | Admit: 2011-09-23 | Discharge: 2011-09-23 | Disposition: A | Discharge status: Home / Self Care | Payer: No Typology Code available for payment source | Attending: Emergency Medicine | Admitting: Emergency Medicine

## 2011-09-23 DIAGNOSIS — IMO0002 Reserved for concepts with insufficient information to code with codable children: Secondary | ICD-10-CM | POA: Insufficient documentation

## 2011-09-23 DIAGNOSIS — Y92410 Unspecified street and highway as the place of occurrence of the external cause: Secondary | ICD-10-CM | POA: Insufficient documentation

## 2011-09-23 DIAGNOSIS — S46919A Strain of unspecified muscle, fascia and tendon at shoulder and upper arm level, unspecified arm, initial encounter: Secondary | ICD-10-CM

## 2011-09-23 DIAGNOSIS — S161XXA Strain of muscle, fascia and tendon at neck level, initial encounter: Secondary | ICD-10-CM

## 2011-09-23 DIAGNOSIS — M542 Cervicalgia: Secondary | ICD-10-CM

## 2011-09-23 DIAGNOSIS — M25559 Pain in unspecified hip: Secondary | ICD-10-CM

## 2011-09-23 DIAGNOSIS — Z79899 Other long term (current) drug therapy: Secondary | ICD-10-CM | POA: Insufficient documentation

## 2011-09-23 DIAGNOSIS — M5030 Other cervical disc degeneration, unspecified cervical region: Secondary | ICD-10-CM

## 2011-09-23 DIAGNOSIS — S139XXA Sprain of joints and ligaments of unspecified parts of neck, initial encounter: Secondary | ICD-10-CM | POA: Insufficient documentation

## 2011-09-23 DIAGNOSIS — M25519 Pain in unspecified shoulder: Secondary | ICD-10-CM

## 2011-09-23 HISTORY — DX: Supraventricular tachycardia, unspecified: I47.10

## 2011-09-23 HISTORY — DX: Supraventricular tachycardia: I47.1

## 2011-09-23 MED ORDER — CYCLOBENZAPRINE HCL 10 MG PO TABS: 10.0000 mg | ORAL_TABLET | Freq: Two times a day (BID) | ORAL | Status: AC | PRN

## 2011-09-23 MED ORDER — IBUPROFEN 800 MG PO TABS: 800.0000 mg | ORAL_TABLET | Freq: Once | ORAL | Status: DC

## 2011-09-23 MED ORDER — KETOROLAC TROMETHAMINE 60 MG/2ML IM SOLN
60.0000 mg | Freq: Once | INTRAMUSCULAR | Status: AC
  Administered 2011-09-23: 60 mg via INTRAMUSCULAR
  Filled 2011-09-23: qty 2

## 2011-09-23 MED ORDER — OXYCODONE-ACETAMINOPHEN 5-325 MG PO TABS: 2.0000 | ORAL_TABLET | Freq: Once | ORAL | Status: DC
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
## 2011-09-23 NOTE — ED Notes (Signed)
D/ced ambulatory with friend(stated it felt better to walk)

## 2011-12-05 LAB — HM MAMMOGRAPHY

## 2012-12-04 DIAGNOSIS — A7740 Ehrlichiosis, unspecified: Secondary | ICD-10-CM

## 2012-12-04 HISTORY — DX: Ehrlichiosis, unspecified: A77.40

## 2013-05-18 ENCOUNTER — Emergency Department (HOSPITAL_COMMUNITY)
Admission: EM | Admit: 2013-05-18 | Discharge: 2013-05-18 | Disposition: A | Discharge status: Home / Self Care | Payer: No Typology Code available for payment source | Attending: Emergency Medicine | Admitting: Emergency Medicine

## 2013-05-18 ENCOUNTER — Encounter (HOSPITAL_COMMUNITY): Payer: Self-pay | Admitting: Emergency Medicine

## 2013-05-18 DIAGNOSIS — R5383 Other fatigue: Secondary | ICD-10-CM | POA: Insufficient documentation

## 2013-05-18 DIAGNOSIS — R51 Headache: Secondary | ICD-10-CM | POA: Insufficient documentation

## 2013-05-18 DIAGNOSIS — R5381 Other malaise: Secondary | ICD-10-CM | POA: Insufficient documentation

## 2013-05-18 DIAGNOSIS — A6920 Lyme disease, unspecified: Secondary | ICD-10-CM | POA: Insufficient documentation

## 2013-05-18 DIAGNOSIS — Z7982 Long term (current) use of aspirin: Secondary | ICD-10-CM | POA: Insufficient documentation

## 2013-05-18 DIAGNOSIS — R110 Nausea: Secondary | ICD-10-CM | POA: Insufficient documentation

## 2013-05-18 LAB — CBC WITH DIFFERENTIAL/PLATELET
Basophils Relative: 5 % — ABNORMAL HIGH (ref 0–1)
Eosinophils Relative: 1 % (ref 0–5)
Hemoglobin: 12.3 g/dL (ref 12.0–15.0)
Lymphs Abs: 3.7 10*3/uL (ref 0.7–4.0)
MCH: 28.3 pg (ref 26.0–34.0)
MCV: 82 fL (ref 78.0–100.0)
Monocytes Relative: 14 % — ABNORMAL HIGH (ref 3–12)
Neutrophils Relative %: 30 % — ABNORMAL LOW (ref 43–77)
Platelets: 172 10*3/uL (ref 150–400)
RBC: 4.34 MIL/uL (ref 3.87–5.11)
WBC: 7.6 10*3/uL (ref 4.0–10.5)

## 2013-05-18 LAB — COMPREHENSIVE METABOLIC PANEL
AST: 289 U/L — ABNORMAL HIGH (ref 0–37)
Albumin: 3.5 g/dL (ref 3.5–5.2)
BUN: 8 mg/dL (ref 6–23)
CO2: 26 mEq/L (ref 19–32)
Glucose, Bld: 116 mg/dL — ABNORMAL HIGH (ref 70–99)
Potassium: 3.6 mEq/L (ref 3.5–5.1)
Sodium: 138 mEq/L (ref 135–145)
Total Protein: 6.7 g/dL (ref 6.0–8.3)

## 2013-05-18 LAB — URINALYSIS, ROUTINE W REFLEX MICROSCOPIC
Glucose, UA: NEGATIVE mg/dL
Hgb urine dipstick: NEGATIVE
Specific Gravity, Urine: 1.006 (ref 1.005–1.030)

## 2013-05-18 LAB — RAPID HIV SCREEN (WH-MAU): Rapid HIV Screen: NONREACTIVE

## 2013-05-18 LAB — CG4 I-STAT (LACTIC ACID): Lactic Acid, Venous: 1.15 mmol/L (ref 0.5–2.2)

## 2013-05-18 MED ORDER — KETOROLAC TROMETHAMINE 30 MG/ML IJ SOLN
15.0000 mg | Freq: Once | INTRAMUSCULAR | Status: AC
  Administered 2013-05-18: 15 mg via INTRAVENOUS
  Filled 2013-05-18: qty 1

## 2013-05-18 MED ORDER — OXYCODONE HCL 5 MG PO CAPS: 5.0000 mg | ORAL_CAPSULE | Freq: Four times a day (QID) | ORAL | Status: DC | PRN

## 2013-05-18 MED ORDER — ONDANSETRON HCL 4 MG PO TABS: 4.0000 mg | ORAL_TABLET | Freq: Four times a day (QID) | ORAL | Status: DC

## 2013-05-18 MED ORDER — DOXYCYCLINE HYCLATE 100 MG PO CAPS: 100.0000 mg | ORAL_CAPSULE | Freq: Two times a day (BID) | ORAL | Status: DC

## 2013-05-18 MED ORDER — DOXYCYCLINE HYCLATE 100 MG PO TABS
100.0000 mg | ORAL_TABLET | Freq: Once | ORAL | Status: AC
  Administered 2013-05-18: 100 mg via ORAL
  Filled 2013-05-18: qty 1

## 2013-05-18 MED ORDER — SODIUM CHLORIDE 0.9 % IV BOLUS (SEPSIS)
1000.0000 mL | Freq: Once | INTRAVENOUS | Status: AC
  Administered 2013-05-18: 1000 mL via INTRAVENOUS

## 2013-05-18 MED ORDER — ONDANSETRON HCL 4 MG/2ML IJ SOLN
4.0000 mg | Freq: Once | INTRAMUSCULAR | Status: AC
  Administered 2013-05-18: 4 mg via INTRAVENOUS
  Filled 2013-05-18: qty 2

## 2013-05-18 NOTE — ED Notes (Signed)
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 She was bitten by a tick mid-May. Tmax was 102.5 on Monday night

## 2013-05-18 NOTE — ED Provider Notes (Signed)
History     CSN: [id number]  Arrival date & time 05/18/13  0930   First MD Initiated Contact with Patient 05/18/13 [phone]      Chief Complaint  Patient presents with  . Fever    (Consider location/radiation/quality/duration/timing/severity/associated sxs/prior treatment) HPI Comments: Pt saw her PCP earlier this week and had labs done for routine health maintenance and found to have leukopenia with WBC count of 2.4 but o/w normal labs.  However fever has persisted for the last 7 days usually 2 episodes of fever per day.  Myalgias and HA worsening.  Patient is a 57 y.o. female presenting with fever. The history is provided by the patient.  Fever Max temp prior to arrival:  102.4 Temp source:  Oral Severity:  Moderate Onset quality:  Gradual Duration:  7 days Timing:  Intermittent Progression:  Waxing and waning Chronicity:  New Relieved by:  Acetaminophen Worsened by:  Nothing tried Associated symptoms: chills, headaches, myalgias and nausea   Associated symptoms: no chest pain, no confusion, no congestion, no cough, no dysuria, no rash, no rhinorrhea, no sore throat and no vomiting   Associated symptoms comment:  One episode of loose stool 5 days ago and none since Risk factors: no hx of cancer, no immunosuppression and no recent travel   Risk factors comment:  Toward the end of may pt found a tick on her leg and pulled it off.  was not there longer than 12 hours.   Past Medical History  Diagnosis Date  . SVT (supraventricular tachycardia)     Past Surgical History  Procedure Laterality Date  . Abdominal hysterectomy      History reviewed. No pertinent family history.  History  Substance Use Topics  . Smoking status: Not on file  . Smokeless tobacco: Not on file  . Alcohol Use: No    OB History   Grav Para Term Preterm Abortions TAB SAB Ect Mult Living                  Review of Systems  Constitutional: Positive for fever, chills and fatigue. Negative for  appetite change.  HENT: Negative for congestion, sore throat and rhinorrhea.   Eyes: Negative for photophobia and redness.  Respiratory: Negative for cough and shortness of breath.   Cardiovascular: Negative for chest pain.  Gastrointestinal: Positive for nausea. Negative for vomiting and abdominal pain.  Genitourinary: Negative for dysuria.  Musculoskeletal: Positive for myalgias. Negative for joint swelling.  Skin: Negative for rash.  Neurological: Positive for headaches.  Psychiatric/Behavioral: Negative for confusion.  All other systems reviewed and are negative.    Allergies  Dilaudid  Home Medications   Current Outpatient Rx  Name  Route  Sig  Dispense  Refill  . aspirin (ASPIRIN EC) 81 MG EC tablet   Oral   Take 81 mg by mouth daily.           Marland Kitchen estrogen-methylTESTOSTERone (ESTRATEST HS) 0.625-1.25 MG per tablet   Oral   Take 1 tablet by mouth daily.           . nadolol (CORGARD) 40 MG tablet   Oral   Take 40 mg by mouth daily.           . vitamin E 400 UNIT capsule   Oral   Take 400 Units by mouth daily.             BP 146/76  Pulse 74  Temp(Src) 98.6 F (37 C) (Oral)  Resp 23  SpO2 98%  Physical Exam  Nursing note and vitals reviewed. Constitutional: She is oriented to person, place, and time. She appears well-developed and well-nourished. No distress.  HENT:  Head: Normocephalic and atraumatic.  Mouth/Throat: Oropharynx is clear and moist.  Eyes: Conjunctivae and EOM are normal. Pupils are equal, round, and reactive to light.  Neck: Normal range of motion. Neck supple. No Brudzinski's sign and no Kernig's sign noted.  Cardiovascular: Normal rate, regular rhythm and intact distal pulses.   No murmur heard. Pulmonary/Chest: Effort normal and breath sounds normal. No respiratory distress. She has no wheezes. She has no rales.  Abdominal: Soft. She exhibits no distension. There is no tenderness. There is no rebound and no guarding.   Musculoskeletal: Normal range of motion. She exhibits no edema and no tenderness.  Neurological: She is alert and oriented to person, place, and time.  Skin: Skin is warm and dry. No rash noted. No erythema. There is pallor.  Psychiatric: She has a normal mood and affect. Her behavior is normal.    ED Course  Procedures (including critical care time)  Labs Reviewed  CBC WITH DIFFERENTIAL - Abnormal; Notable for the following:    HCT 35.6 (*)    Neutrophils Relative % 30 (*)    Lymphocytes Relative 50 (*)    Monocytes Relative 14 (*)    Basophils Relative 5 (*)    Monocytes Absolute 1.1 (*)    Basophils Absolute 0.4 (*)    All other components within normal limits  COMPREHENSIVE METABOLIC PANEL - Abnormal; Notable for the following:    Glucose, Bld 116 (*)    AST 289 (*)    ALT 267 (*)    Alkaline Phosphatase 416 (*)    All other components within normal limits  URINALYSIS, ROUTINE W REFLEX MICROSCOPIC  RAPID HIV SCREEN ([facility])  LYME DISEASE DNA BY PCR([facility])  HEPATITIS PANEL, ACUTE  CG4 I-STAT ([facility])   No results found.   1. Lyme disease       MDM   Patient presents with one week of fever up to 102 with headache, chills and fatigue. She states approximately 3-4 weeks ago she was in [place] and found to take on her leg. She was fine and 2-3 weeks later is when the fever developed.  She saw her PCP last week and had a CBC, CMP thyroid studies and cholesterol that was routine per her doctor's office and was tapped found to have a leukopenia of 2.4 but otherwise normal labs. She also had one day of loose stool and that was tested and only showed rare white blood cells.  Patient states she's been tested for HIV approximately 4 years ago and it was normal. She is not sexually active and has had no needle sticks. She presented today due to fever being persistent and worsening headache. She denies any nausea or vomiting or rash. Vital signs are stable. Concern for  a non-spotted fever versus Lyme disease as patient was in [place]. No meningeal signs on exam full range of motion of her neck and normal mental status. Currently afebrile.  CBC, CMP, UA, Lyme titers sent. Patient given IV fluids.  3:11 PM Normal except for new transaminitis and atypical lymphocytes.  HIV and hepatitis sent.  Discussed LP but pt refused.  Pt given doxy and IVF.  Vomited after having doxy.  Pt denies any hx of herpes and no signs of encephalitis due to altered mental status.      [employer] [facility],  MD 05/18/13 1606

## 2013-05-18 NOTE — ED Notes (Signed)
MD at bedside. 

## 2013-05-18 NOTE — ED Notes (Signed)
Phlebotomy at bedside.

## 2013-05-19 LAB — HEPATITIS PANEL, ACUTE
HCV Ab: NEGATIVE
Hep A IgM: NEGATIVE
Hepatitis B Surface Ag: NEGATIVE

## 2013-05-21 ENCOUNTER — Telehealth (HOSPITAL_COMMUNITY): Payer: Self-pay | Admitting: Emergency Medicine

## 2013-05-22 ENCOUNTER — Telehealth (HOSPITAL_COMMUNITY): Payer: Self-pay | Admitting: Emergency Medicine

## 2013-07-08 ENCOUNTER — Telehealth (HOSPITAL_COMMUNITY): Payer: Self-pay | Admitting: *Deleted

## 2013-07-24 ENCOUNTER — Encounter (HOSPITAL_COMMUNITY): Payer: Self-pay | Admitting: Emergency Medicine

## 2013-07-24 ENCOUNTER — Emergency Department (HOSPITAL_COMMUNITY)
Admission: EM | Admit: 2013-07-24 | Discharge: 2013-07-24 | Disposition: A | Discharge status: Home / Self Care | Payer: No Typology Code available for payment source | Attending: Emergency Medicine | Admitting: Emergency Medicine

## 2013-07-24 DIAGNOSIS — Z79899 Other long term (current) drug therapy: Secondary | ICD-10-CM | POA: Insufficient documentation

## 2013-07-24 DIAGNOSIS — Z7982 Long term (current) use of aspirin: Secondary | ICD-10-CM | POA: Insufficient documentation

## 2013-07-24 DIAGNOSIS — R000 Tachycardia, unspecified: Secondary | ICD-10-CM | POA: Insufficient documentation

## 2013-07-24 DIAGNOSIS — R5381 Other malaise: Secondary | ICD-10-CM | POA: Insufficient documentation

## 2013-07-24 DIAGNOSIS — R42 Dizziness and giddiness: Secondary | ICD-10-CM | POA: Insufficient documentation

## 2013-07-24 DIAGNOSIS — T426X1A Poisoning by other antiepileptic and sedative-hypnotic drugs, accidental (unintentional), initial encounter: Secondary | ICD-10-CM | POA: Insufficient documentation

## 2013-07-24 DIAGNOSIS — Y939 Activity, unspecified: Secondary | ICD-10-CM | POA: Insufficient documentation

## 2013-07-24 DIAGNOSIS — R51 Headache: Secondary | ICD-10-CM | POA: Insufficient documentation

## 2013-07-24 DIAGNOSIS — Y929 Unspecified place or not applicable: Secondary | ICD-10-CM | POA: Insufficient documentation

## 2013-07-24 MED ORDER — SODIUM CHLORIDE 0.9 % IV BOLUS (SEPSIS)
500.0000 mL | Freq: Once | INTRAVENOUS | Status: AC
  Administered 2013-07-24: 500 mL via INTRAVENOUS

## 2013-07-24 NOTE — ED Notes (Signed)
Per EMS - pt took 2 lamictal pills at 9am then again at 11am on accident, pt became tachycardic and anxious. EMS administered 50 of charcoal. HR 102 BP 122/84 100% on room air.

## 2013-07-24 NOTE — ED Notes (Signed)
Pt reports the initial dizziness and blurred vision has cleared up.

## 2013-07-24 NOTE — ED Provider Notes (Signed)
CSN: [id number]     Arrival date & time 07/24/13  1234 History     First MD Initiated Contact with Patient 07/24/13 1242     Chief Complaint  Patient presents with  . Drug Overdose   (Consider location/radiation/quality/duration/timing/severity/associated sxs/prior Treatment) HPI 57 year old female with a history of anxiety and questionable mood disorder presents following ingestion of additional Lamictal.  Patient reports that she took 2, 200 mg Lamictal tablets this morning and in inadvertently took 2 additional ones at 11 AM.  She reports that she felt weak, was unable to walk, had headache and dizziness, and felt tachycardic.  She then called EMS.  She was given charcoal in route with some improvement symptoms.  In ED, patient continued to endorse the above symptoms.  She reported she felt "ataxic" and dizzy. she denied chest pain, shortness of breath, Abdominal pain.   Past Medical History  Diagnosis Date  . SVT (supraventricular tachycardia)    Past Surgical History  Procedure Laterality Date  . Abdominal hysterectomy    . Cholecystectomy    . Appendectomy     No family history on file. History  Substance Use Topics  . Smoking status: Never Smoker   . Smokeless tobacco: Not on file  . Alcohol Use: No   OB History   Grav Para Term Preterm Abortions TAB SAB Ect Mult Living                 Review of Systems  Constitutional: Negative for fever and chills.  Respiratory: Negative for shortness of breath.   Cardiovascular: Negative for chest pain.  Gastrointestinal: Negative for nausea, vomiting and abdominal pain.  Genitourinary: Negative.   Skin: Negative for rash.  Neurological: Positive for dizziness and weakness.  Psychiatric/Behavioral:       Anxiety    Allergies  Dilaudid  Home Medications   Current Outpatient Rx  Name  Route  Sig  Dispense  Refill  . albuterol (PROVENTIL HFA;VENTOLIN HFA) 108 (90 BASE) MCG/ACT inhaler   Inhalation   Inhale 2 puffs  into the lungs every 6 (six) hours as needed for wheezing.         Marland Kitchen aspirin (ASPIRIN EC) 81 MG EC tablet   Oral   Take 81 mg by mouth at bedtime.          . beclomethasone (QVAR) 80 MCG/ACT inhaler   Inhalation   Inhale 1 puff into the lungs as needed (shotness of breath).          . clonazePAM (KLONOPIN) 1 MG tablet   Oral   Take 1 mg by mouth at bedtime as needed for anxiety.          . lamoTRIgine (LAMICTAL) 200 MG tablet   Oral   Take 400 mg by mouth every morning.          . nadolol (CORGARD) 20 MG tablet   Oral   Take 20 mg by mouth at bedtime.          . temazepam (RESTORIL) 30 MG capsule   Oral   Take 30 mg by mouth at bedtime as needed for sleep.          BP 118/65  Pulse 117  Temp(Src) 98.5 F (36.9 C)  Resp 26  SpO2 95% Physical Exam  Constitutional: She is oriented to person, place, and time. She appears well-developed and well-nourished.  HENT:  Head: Normocephalic and atraumatic.  Eyes: EOM are normal.  Cardiovascular: Normal rate and  regular rhythm.   No murmur heard. Pulmonary/Chest: Effort normal and breath sounds normal. No respiratory distress. She has no wheezes. She has no rales.  Abdominal: Soft. Bowel sounds are normal. She exhibits no distension. There is no tenderness.  Musculoskeletal: She exhibits no edema.  Neurological: She is alert and oriented to person, place, and time.  Skin: Skin is warm and dry.  Psychiatric:  Appears anxious   ED Course   Procedures (including critical care time) EKG  Date: 07/24/2013  Rate: 101  Rhythm: sinus tachycardia  QRS Axis: normal  Intervals: normal  ST/T Wave abnormalities: normal  Conduction Disutrbances:none  Old EKG Reviewed: None available  Labs Reviewed - No data to display No results found. No diagnosis found.  MDM  57 year old female who presents following ingestion of 800 mg of Lamictal.   - EKG normal. - Patient currently having symptoms, primarily dizziness and  ataxia. - Will give NS bolus x 1. - Will contact Poison control.   1600 - Patient feeling better.  Patient able to ambulate without assistance. Patient stable; will discharge home with close follow up. I spoke with poison control who recommended support care and discharge home once patient was back at baseline.    Tommie Sams, DO 07/24/13 1612

## 2013-07-24 NOTE — ED Provider Notes (Signed)
I saw and evaluated the patient, reviewed the resident's note and I agree with the findings and plan. Pt with accidentally took twice her normal dose of Lamictal with ataxia and vertigo after. Symptoms have now resolved mostly. Discussed with poison control. Supportive treatment recommended. Return precautions given.   Loren Racer, MD 07/24/13 [phone]

## 2013-07-24 NOTE — ED Notes (Signed)
Pt reports she took her lamictal this morning and then couldn't remember if she took it so went ahead and took it again, reports she has been very stressed out because her horse just died. Reports immediately she got blurred vision and dizziness then call ems. sts now she is having a bad HA. Pt in nad, skin warm and dry, resp e/u. Denies vomiting after taking the charcoal.

## 2013-07-24 NOTE — ED Notes (Signed)
Patient verbalizes understanding of discharge instructions 

## 2013-08-08 ENCOUNTER — Telehealth (HOSPITAL_COMMUNITY): Payer: Self-pay | Admitting: Emergency Medicine

## 2013-08-08 NOTE — ED Notes (Signed)
Call from pt regarding being billed for EKG and states didn't have machine brought in to room to be done.  Wanted to know if monitors in Pod E had that capability.  Talked w/Charge RN Lequita Halt and she stated the monitors did have the capability.  EKG for specified date in EPIC.  Pt informed.

## 2014-02-13 ENCOUNTER — Ambulatory Visit: Payer: PRIVATE HEALTH INSURANCE | Admitting: Cardiovascular Disease

## 2014-06-12 ENCOUNTER — Encounter: Payer: Self-pay | Admitting: Family Medicine

## 2014-06-12 ENCOUNTER — Ambulatory Visit (INDEPENDENT_AMBULATORY_CARE_PROVIDER_SITE_OTHER): Payer: 59 | Admitting: Family Medicine

## 2014-06-12 VITALS — BP 102/78 | HR 82 | Temp 98.5°F | Ht 65.0 in | Wt 182.1 lb

## 2014-06-12 DIAGNOSIS — E739 Lactose intolerance, unspecified: Secondary | ICD-10-CM | POA: Insufficient documentation

## 2014-06-12 DIAGNOSIS — J45909 Unspecified asthma, uncomplicated: Secondary | ICD-10-CM | POA: Insufficient documentation

## 2014-06-12 DIAGNOSIS — G4726 Circadian rhythm sleep disorder, shift work type: Secondary | ICD-10-CM

## 2014-06-12 DIAGNOSIS — B019 Varicella without complication: Secondary | ICD-10-CM | POA: Insufficient documentation

## 2014-06-12 DIAGNOSIS — R5383 Other fatigue: Secondary | ICD-10-CM

## 2014-06-12 DIAGNOSIS — K219 Gastro-esophageal reflux disease without esophagitis: Secondary | ICD-10-CM

## 2014-06-12 DIAGNOSIS — N816 Rectocele: Secondary | ICD-10-CM

## 2014-06-12 DIAGNOSIS — F4323 Adjustment disorder with mixed anxiety and depressed mood: Secondary | ICD-10-CM

## 2014-06-12 DIAGNOSIS — Z0000 Encounter for general adult medical examination without abnormal findings: Secondary | ICD-10-CM

## 2014-06-12 DIAGNOSIS — R000 Tachycardia, unspecified: Secondary | ICD-10-CM

## 2014-06-12 DIAGNOSIS — B269 Mumps without complication: Secondary | ICD-10-CM | POA: Insufficient documentation

## 2014-06-12 DIAGNOSIS — C4492 Squamous cell carcinoma of skin, unspecified: Secondary | ICD-10-CM

## 2014-06-12 DIAGNOSIS — R5381 Other malaise: Secondary | ICD-10-CM

## 2014-06-12 DIAGNOSIS — N8111 Cystocele, midline: Secondary | ICD-10-CM

## 2014-06-12 DIAGNOSIS — Z8619 Personal history of other infectious and parasitic diseases: Secondary | ICD-10-CM | POA: Insufficient documentation

## 2014-06-12 DIAGNOSIS — G4730 Sleep apnea, unspecified: Secondary | ICD-10-CM

## 2014-06-12 DIAGNOSIS — F418 Other specified anxiety disorders: Secondary | ICD-10-CM

## 2014-06-12 DIAGNOSIS — R635 Abnormal weight gain: Secondary | ICD-10-CM

## 2014-06-12 DIAGNOSIS — R945 Abnormal results of liver function studies: Secondary | ICD-10-CM

## 2014-06-12 DIAGNOSIS — E079 Disorder of thyroid, unspecified: Secondary | ICD-10-CM

## 2014-06-12 DIAGNOSIS — R519 Headache, unspecified: Secondary | ICD-10-CM | POA: Insufficient documentation

## 2014-06-12 DIAGNOSIS — R51 Headache: Secondary | ICD-10-CM

## 2014-06-12 DIAGNOSIS — IMO0002 Reserved for concepts with insufficient information to code with codable children: Secondary | ICD-10-CM

## 2014-06-12 DIAGNOSIS — G2581 Restless legs syndrome: Secondary | ICD-10-CM

## 2014-06-12 DIAGNOSIS — E063 Autoimmune thyroiditis: Secondary | ICD-10-CM | POA: Insufficient documentation

## 2014-06-12 DIAGNOSIS — D649 Anemia, unspecified: Secondary | ICD-10-CM | POA: Insufficient documentation

## 2014-06-12 DIAGNOSIS — K769 Liver disease, unspecified: Secondary | ICD-10-CM

## 2014-06-12 DIAGNOSIS — G4733 Obstructive sleep apnea (adult) (pediatric): Secondary | ICD-10-CM

## 2014-06-12 HISTORY — DX: Other specified anxiety disorders: F41.8

## 2014-06-12 HISTORY — DX: Lactose intolerance, unspecified: E73.9

## 2014-06-12 HISTORY — DX: Adjustment disorder with mixed anxiety and depressed mood: F43.23

## 2014-06-12 HISTORY — DX: Tachycardia, unspecified: R00.0

## 2014-06-12 HISTORY — DX: Squamous cell carcinoma of skin, unspecified: C44.92

## 2014-06-12 HISTORY — DX: Rectocele: N81.6

## 2014-06-12 MED ORDER — ESCITALOPRAM OXALATE 10 MG PO TABS: ORAL_TABLET | ORAL | Status: DC

## 2014-06-12 MED ORDER — CLONAZEPAM 1 MG PO TABS: 1.0000 mg | ORAL_TABLET | Freq: Every evening | ORAL | Status: DC | PRN

## 2014-06-12 MED ORDER — TEMAZEPAM 30 MG PO CAPS: 30.0000 mg | ORAL_CAPSULE | Freq: Every evening | ORAL | Status: DC | PRN

## 2014-06-12 MED ORDER — OMEPRAZOLE 20 MG PO CPDR: 20.0000 mg | DELAYED_RELEASE_CAPSULE | Freq: Every day | ORAL | Status: DC

## 2014-06-12 NOTE — Progress Notes (Signed)
Pre visit review using our clinic review tool, if applicable. No additional management support is needed unless otherwise documented below in the visit note. 

## 2014-06-12 NOTE — Assessment & Plan Note (Signed)
Recurrent s/p TAH and cystocele repair as well

## 2014-06-12 NOTE — Patient Instructions (Signed)
Preventive Care for Adults A healthy lifestyle and preventive care can promote health and wellness. Preventive health guidelines for women include the following key practices.  A routine yearly physical is a good way to check with your health care provider about your health and preventive screening. It is a chance to share any concerns and updates on your health and to receive a thorough exam.  Visit your dentist for a routine exam and preventive care every 6 months. Brush your teeth twice a day and floss once a day. Good oral hygiene prevents tooth decay and gum disease.  The frequency of eye exams is based on your age, health, family medical history, use of contact lenses, and other factors. Follow your health care provider's recommendations for frequency of eye exams.  Eat a healthy diet. Foods like vegetables, fruits, whole grains, low-fat dairy products, and lean protein foods contain the nutrients you need without too many calories. Decrease your intake of foods high in solid fats, added sugars, and salt. Eat the right amount of calories for you.Get information about a proper diet from your health care provider, if necessary.  Regular physical exercise is one of the most important things you can do for your health. Most adults should get at least 150 minutes of moderate-intensity exercise (any activity that increases your heart rate and causes you to sweat) each week. In addition, most adults need muscle-strengthening exercises on 2 or more days a week.  Maintain a healthy weight. The body mass index (BMI) is a screening tool to identify possible weight problems. It provides an estimate of body fat based on height and weight. Your health care provider can find your BMI, and can help you achieve or maintain a healthy weight.For adults 20 years and older:  A BMI below 18.5 is considered underweight.  A BMI of 18.5 to 24.9 is normal.  A BMI of 25 to 29.9 is considered overweight.  A BMI of  30 and above is considered obese.  Maintain normal blood lipids and cholesterol levels by exercising and minimizing your intake of saturated fat. Eat a balanced diet with plenty of fruit and vegetables. Blood tests for lipids and cholesterol should begin at age 52 and be repeated every 5 years. If your lipid or cholesterol levels are high, you are over 50, or you are at high risk for heart disease, you may need your cholesterol levels checked more frequently.Ongoing high lipid and cholesterol levels should be treated with medicines if diet and exercise are not working.  If you smoke, find out from your health care provider how to quit. If you do not use tobacco, do not start.  Lung cancer screening is recommended for adults aged 37-80 years who are at high risk for developing lung cancer because of a history of smoking. A yearly low-dose CT scan of the lungs is recommended for people who have at least a 30-pack-year history of smoking and are a current smoker or have quit within the past 15 years. A pack year of smoking is smoking an average of 1 pack of cigarettes a day for 1 year (for example: 1 pack a day for 30 years or 2 packs a day for 15 years). Yearly screening should continue until the smoker has stopped smoking for at least 15 years. Yearly screening should be stopped for people who develop a health problem that would prevent them from having lung cancer treatment.  If you are pregnant, do not drink alcohol. If you are breastfeeding,  be very cautious about drinking alcohol. If you are not pregnant and choose to drink alcohol, do not have more than 1 drink per day. One drink is considered to be 12 ounces (355 mL) of beer, 5 ounces (148 mL) of wine, or 1.5 ounces (44 mL) of liquor.  Avoid use of street drugs. Do not share needles with anyone. Ask for help if you need support or instructions about stopping the use of drugs.  High blood pressure causes heart disease and increases the risk of  stroke. Your blood pressure should be checked at least every 1 to 2 years. Ongoing high blood pressure should be treated with medicines if weight loss and exercise do not work.  If you are 75-52 years old, ask your health care provider if you should take aspirin to prevent strokes.  Diabetes screening involves taking a blood sample to check your fasting blood sugar level. This should be done once every 3 years, after age 15, if you are within normal weight and without risk factors for diabetes. Testing should be considered at a younger age or be carried out more frequently if you are overweight and have at least 1 risk factor for diabetes.  Breast cancer screening is essential preventive care for women. You should practice "breast self-awareness." This means understanding the normal appearance and feel of your breasts and may include breast self-examination. Any changes detected, no matter how small, should be reported to a health care provider. Women in their 58s and 30s should have a clinical breast exam (CBE) by a health care provider as part of a regular health exam every 1 to 3 years. After age 16, women should have a CBE every year. Starting at age 53, women should consider having a mammogram (breast X-ray test) every year. Women who have a family history of breast cancer should talk to their health care provider about genetic screening. Women at a high risk of breast cancer should talk to their health care providers about having an MRI and a mammogram every year.  Breast cancer gene (BRCA)-related cancer risk assessment is recommended for women who have family members with BRCA-related cancers. BRCA-related cancers include breast, ovarian, tubal, and peritoneal cancers. Having family members with these cancers may be associated with an increased risk for harmful changes (mutations) in the breast cancer genes BRCA1 and BRCA2. Results of the assessment will determine the need for genetic counseling and  BRCA1 and BRCA2 testing.  Routine pelvic exams to screen for cancer are no longer recommended for nonpregnant women who are considered low risk for cancer of the pelvic organs (ovaries, uterus, and vagina) and who do not have symptoms. Ask your health care provider if a screening pelvic exam is right for you.  If you have had past treatment for cervical cancer or a condition that could lead to cancer, you need Pap tests and screening for cancer for at least 20 years after your treatment. If Pap tests have been discontinued, your risk factors (such as having a new sexual partner) need to be reassessed to determine if screening should be resumed. Some women have medical problems that increase the chance of getting cervical cancer. In these cases, your health care provider may recommend more frequent screening and Pap tests.  The HPV test is an additional test that may be used for cervical cancer screening. The HPV test looks for the virus that can cause the cell changes on the cervix. The cells collected during the Pap test can be  tested for HPV. The HPV test could be used to screen women aged 47 years and older, and should be used in women of any age who have unclear Pap test results. After the age of 36, women should have HPV testing at the same frequency as a Pap test.  Colorectal cancer can be detected and often prevented. Most routine colorectal cancer screening begins at the age of 38 years and continues through age 58 years. However, your health care provider may recommend screening at an earlier age if you have risk factors for colon cancer. On a yearly basis, your health care provider may provide home test kits to check for hidden blood in the stool. Use of a small camera at the end of a tube, to directly examine the colon (sigmoidoscopy or colonoscopy), can detect the earliest forms of colorectal cancer. Talk to your health care provider about this at age 64, when routine screening begins. Direct  exam of the colon should be repeated every 5-10 years through age 21 years, unless early forms of pre-cancerous polyps or small growths are found.  People who are at an increased risk for hepatitis B should be screened for this virus. You are considered at high risk for hepatitis B if:  You were born in a country where hepatitis B occurs often. Talk with your health care provider about which countries are considered high risk.  Your parents were born in a high-risk country and you have not received a shot to protect against hepatitis B (hepatitis B vaccine).  You have HIV or AIDS.  You use needles to inject street drugs.  You live with, or have sex with, someone who has Hepatitis B.  You get hemodialysis treatment.  You take certain medicines for conditions like cancer, organ transplantation, and autoimmune conditions.  Hepatitis C blood testing is recommended for all people born from [birth year] through 1965 and any individual with known risks for hepatitis C.  Practice safe sex. Use condoms and avoid high-risk sexual practices to reduce the spread of sexually transmitted infections (STIs). STIs include gonorrhea, chlamydia, syphilis, trichomonas, herpes, HPV, and human immunodeficiency virus (HIV). Herpes, HIV, and HPV are viral illnesses that have no cure. They can result in disability, cancer, and death.  You should be screened for sexually transmitted illnesses (STIs) including gonorrhea and chlamydia if:  You are sexually active and are younger than 24 years.  You are older than 24 years and your health care provider tells you that you are at risk for this type of infection.  Your sexual activity has changed since you were last screened and you are at an increased risk for chlamydia or gonorrhea. Ask your health care provider if you are at risk.  If you are at risk of being infected with HIV, it is recommended that you take a prescription medicine daily to prevent HIV infection. This is  called preexposure prophylaxis (PrEP). You are considered at risk if:  You are a heterosexual woman, are sexually active, and are at increased risk for HIV infection.  You take drugs by injection.  You are sexually active with a partner who has HIV.  Talk with your health care provider about whether you are at high risk of being infected with HIV. If you choose to begin PrEP, you should first be tested for HIV. You should then be tested every 3 months for as long as you are taking PrEP.  Osteoporosis is a disease in which the bones lose minerals and strength  with aging. This can result in serious bone fractures or breaks. The risk of osteoporosis can be identified using a bone density scan. Women ages 65 years and over and women at risk for fractures or osteoporosis should discuss screening with their health care providers. Ask your health care provider whether you should take a calcium supplement or vitamin D to reduce the rate of osteoporosis.  Menopause can be associated with physical symptoms and risks. Hormone replacement therapy is available to decrease symptoms and risks. You should talk to your health care provider about whether hormone replacement therapy is right for you.  Use sunscreen. Apply sunscreen liberally and repeatedly throughout the day. You should seek shade when your shadow is shorter than you. Protect yourself by wearing long sleeves, pants, a wide-brimmed hat, and sunglasses year round, whenever you are outdoors.  Once a month, do a whole body skin exam, using a mirror to look at the skin on your back. Tell your health care provider of new moles, moles that have irregular borders, moles that are larger than a pencil eraser, or moles that have changed in shape or color.  Stay current with required vaccines (immunizations).  Influenza vaccine. All adults should be immunized every year.  Tetanus, diphtheria, and acellular pertussis (Td, Tdap) vaccine. Pregnant women should  receive 1 dose of Tdap vaccine during each pregnancy. The dose should be obtained regardless of the length of time since the last dose. Immunization is preferred during the 27th-36th week of gestation. An adult who has not previously received Tdap or who does not know her vaccine status should receive 1 dose of Tdap. This initial dose should be followed by tetanus and diphtheria toxoids (Td) booster doses every 10 years. Adults with an unknown or incomplete history of completing a 3-dose immunization series with Td-containing vaccines should begin or complete a primary immunization series including a Tdap dose. Adults should receive a Td booster every 10 years.  Varicella vaccine. An adult without evidence of immunity to varicella should receive 2 doses or a second dose if she has previously received 1 dose. Pregnant females who do not have evidence of immunity should receive the first dose after pregnancy. This first dose should be obtained before leaving the health care facility. The second dose should be obtained 4-8 weeks after the first dose.  Human papillomavirus (HPV) vaccine. Females aged 13-26 years who have not received the vaccine previously should obtain the 3-dose series. The vaccine is not recommended for use in pregnant females. However, pregnancy testing is not needed before receiving a dose. If a female is found to be pregnant after receiving a dose, no treatment is needed. In that case, the remaining doses should be delayed until after the pregnancy. Immunization is recommended for any person with an immunocompromised condition through the age of 26 years if she did not get any or all doses earlier. During the 3-dose series, the second dose should be obtained 4-8 weeks after the first dose. The third dose should be obtained 24 weeks after the first dose and 16 weeks after the second dose.  Zoster vaccine. One dose is recommended for adults aged 60 years or older unless certain conditions are  present.  Measles, mumps, and rubella (MMR) vaccine. Adults born before 1957 generally are considered immune to measles and mumps. Adults born in 1957 or later should have 1 or more doses of MMR vaccine unless there is a contraindication to the vaccine or there is laboratory evidence of immunity to   each of the three diseases. A routine second dose of MMR vaccine should be obtained at least 28 days after the first dose for students attending postsecondary schools, health care workers, or international travelers. People who received inactivated measles vaccine or an unknown type of measles vaccine during 1963-1967 should receive 2 doses of MMR vaccine. People who received inactivated mumps vaccine or an unknown type of mumps vaccine before 1979 and are at high risk for mumps infection should consider immunization with 2 doses of MMR vaccine. For females of childbearing age, rubella immunity should be determined. If there is no evidence of immunity, females who are not pregnant should be vaccinated. If there is no evidence of immunity, females who are pregnant should delay immunization until after pregnancy. Unvaccinated health care workers born before 1957 who lack laboratory evidence of measles, mumps, or rubella immunity or laboratory confirmation of disease should consider measles and mumps immunization with 2 doses of MMR vaccine or rubella immunization with 1 dose of MMR vaccine.  Pneumococcal 13-valent conjugate (PCV13) vaccine. When indicated, a person who is uncertain of her immunization history and has no record of immunization should receive the PCV13 vaccine. An adult aged 19 years or older who has certain medical conditions and has not been previously immunized should receive 1 dose of PCV13 vaccine. This PCV13 should be followed with a dose of pneumococcal polysaccharide (PPSV23) vaccine. The PPSV23 vaccine dose should be obtained at least 8 weeks after the dose of PCV13 vaccine. An adult aged 19  years or older who has certain medical conditions and previously received 1 or more doses of PPSV23 vaccine should receive 1 dose of PCV13. The PCV13 vaccine dose should be obtained 1 or more years after the last PPSV23 vaccine dose.  Pneumococcal polysaccharide (PPSV23) vaccine. When PCV13 is also indicated, PCV13 should be obtained first. All adults aged 65 years and older should be immunized. An adult younger than age 65 years who has certain medical conditions should be immunized. Any person who resides in a nursing home or long-term care facility should be immunized. An adult smoker should be immunized. People with an immunocompromised condition and certain other conditions should receive both PCV13 and PPSV23 vaccines. People with human immunodeficiency virus (HIV) infection should be immunized as soon as possible after diagnosis. Immunization during chemotherapy or radiation therapy should be avoided. Routine use of PPSV23 vaccine is not recommended for American Indians, [place] Natives, or people younger than 65 years unless there are medical conditions that require PPSV23 vaccine. When indicated, people who have unknown immunization and have no record of immunization should receive PPSV23 vaccine. One-time revaccination 5 years after the first dose of PPSV23 is recommended for people aged 19-64 years who have chronic kidney failure, nephrotic syndrome, asplenia, or immunocompromised conditions. People who received 1-2 doses of PPSV23 before age 65 years should receive another dose of PPSV23 vaccine at age 65 years or later if at least 5 years have passed since the previous dose. Doses of PPSV23 are not needed for people immunized with PPSV23 at or after age 65 years.  Meningococcal vaccine. Adults with asplenia or persistent complement component deficiencies should receive 2 doses of quadrivalent meningococcal conjugate (MenACWY-D) vaccine. The doses should be obtained at least 2 months apart.  Microbiologists working with certain meningococcal bacteria, military recruits, people at risk during an outbreak, and people who travel to or live in countries with a high rate of meningitis should be immunized. A first-year college student up through age   21 years who is living in a residence hall should receive a dose if she did not receive a dose on or after her 16th birthday. Adults who have certain high-risk conditions should receive one or more doses of vaccine.  Hepatitis A vaccine. Adults who wish to be protected from this disease, have certain high-risk conditions, work with hepatitis A-infected animals, work in hepatitis A research labs, or travel to or work in countries with a high rate of hepatitis A should be immunized. Adults who were previously unvaccinated and who anticipate close contact with an international adoptee during the first 60 days after arrival in the [place] from a country with a high rate of hepatitis A should be immunized.  Hepatitis B vaccine. Adults who wish to be protected from this disease, have certain high-risk conditions, may be exposed to blood or other infectious body fluids, are household contacts or sex partners of hepatitis B positive people, are clients or workers in certain care facilities, or travel to or work in countries with a high rate of hepatitis B should be immunized.  Haemophilus influenzae type b (Hib) vaccine. A previously unvaccinated person with asplenia or sickle cell disease or having a scheduled splenectomy should receive 1 dose of Hib vaccine. Regardless of previous immunization, a recipient of a hematopoietic stem cell transplant should receive a 3-dose series 6-12 months after her successful transplant. Hib vaccine is not recommended for adults with HIV infection. Preventive Services / Frequency Ages 43 to 39years  Blood pressure check.** / Every 1 to 2 years.  Lipid and cholesterol check.** / Every 5 years beginning at age  75.  Clinical breast exam.** / Every 3 years for women in their 32s and 74s.  BRCA-related cancer risk assessment.** / For women who have family members with a BRCA-related cancer (breast, ovarian, tubal, or peritoneal cancers).  Pap test.** / Every 2 years from ages 65 through 91. Every 3 years starting at age 34 through age 93 or 72 with a history of 3 consecutive normal Pap tests.  HPV screening.** / Every 3 years from ages 46 through ages 53 to 26 with a history of 3 consecutive normal Pap tests.  Hepatitis C blood test.** / For any individual with known risks for hepatitis C.  Skin self-exam. / Monthly.  Influenza vaccine. / Every year.  Tetanus, diphtheria, and acellular pertussis (Tdap, Td) vaccine.** / Consult your health care provider. Pregnant women should receive 1 dose of Tdap vaccine during each pregnancy. 1 dose of Td every 10 years.  Varicella vaccine.** / Consult your health care provider. Pregnant females who do not have evidence of immunity should receive the first dose after pregnancy.  HPV vaccine. / 3 doses over 6 months, if 70 and younger. The vaccine is not recommended for use in pregnant females. However, pregnancy testing is not needed before receiving a dose.  Measles, mumps, rubella (MMR) vaccine.** / You need at least 1 dose of MMR if you were born in 1957 or later. You may also need a 2nd dose. For females of childbearing age, rubella immunity should be determined. If there is no evidence of immunity, females who are not pregnant should be vaccinated. If there is no evidence of immunity, females who are pregnant should delay immunization until after pregnancy.  Pneumococcal 13-valent conjugate (PCV13) vaccine.** / Consult your health care provider.  Pneumococcal polysaccharide (PPSV23) vaccine.** / 1 to 2 doses if you smoke cigarettes or if you have certain conditions.  Meningococcal vaccine.** /  1 dose if you are age 70 to 51 years and a [place] living in a residence hall, or have one of several medical conditions, you need to get vaccinated against meningococcal disease. You may also need additional booster doses.  Hepatitis A vaccine.** / Consult your health care provider.  Hepatitis B vaccine.** / Consult your health care provider.  Haemophilus influenzae type b (Hib) vaccine.** / Consult your health care provider. Ages 40 to 64years  Blood pressure check.** / Every 1 to 2 years.  Lipid and cholesterol check.** / Every 5 years beginning at age 58 years.  Lung cancer screening. / Every year if you are aged 56-80 years and have a 30-pack-year history of smoking and currently smoke or have quit within the past 15 years. Yearly screening is stopped once you have quit smoking for at least 15 years or develop a health problem that would prevent you from having lung cancer treatment.  Clinical breast exam.** / Every year after age 35 years.  BRCA-related cancer risk assessment.** / For women who have family members with a BRCA-related cancer (breast, ovarian, tubal, or peritoneal cancers).  Mammogram.** / Every year beginning at age 109 years and continuing for as long as you are in good health. Consult with your health care provider.  Pap test.** / Every 3 years starting at age 44 years through age 94 or 70 years with a history of 3 consecutive normal Pap tests.  HPV screening.** / Every 3 years from ages 109 years through ages 50 to 30 years with a history of 3 consecutive normal Pap tests.  Fecal occult blood test (FOBT) of stool. / Every year beginning at age 73 years and continuing until age 59 years. You may not need to do this test if you get a colonoscopy every 10 years.  Flexible sigmoidoscopy or colonoscopy.** / Every 5 years for a flexible sigmoidoscopy or every 10 years for a colonoscopy beginning at age 68 years and continuing until age 12 years.  Hepatitis C blood test.** / For all people born from [birth year] through  1965 and any individual with known risks for hepatitis C.  Skin self-exam. / Monthly.  Influenza vaccine. / Every year.  Tetanus, diphtheria, and acellular pertussis (Tdap/Td) vaccine.** / Consult your health care provider. Pregnant women should receive 1 dose of Tdap vaccine during each pregnancy. 1 dose of Td every 10 years.  Varicella vaccine.** / Consult your health care provider. Pregnant females who do not have evidence of immunity should receive the first dose after pregnancy.  Zoster vaccine.** / 1 dose for adults aged 2 years or older.  Measles, mumps, rubella (MMR) vaccine.** / You need at least 1 dose of MMR if you were born in 1957 or later. You may also need a 2nd dose. For females of childbearing age, rubella immunity should be determined. If there is no evidence of immunity, females who are not pregnant should be vaccinated. If there is no evidence of immunity, females who are pregnant should delay immunization until after pregnancy.  Pneumococcal 13-valent conjugate (PCV13) vaccine.** / Consult your health care provider.  Pneumococcal polysaccharide (PPSV23) vaccine.** / 1 to 2 doses if you smoke cigarettes or if you have certain conditions.  Meningococcal vaccine.** / Consult your health care provider.  Hepatitis A vaccine.** / Consult your health care provider.  Hepatitis B vaccine.** / Consult your health care provider.  Haemophilus influenzae type b (Hib) vaccine.** / Consult your health care provider. Ages 48 years  and over  Blood pressure check.** / Every 1 to 2 years.  Lipid and cholesterol check.** / Every 5 years beginning at age 84 years.  Lung cancer screening. / Every year if you are aged 50-80 years and have a 30-pack-year history of smoking and currently smoke or have quit within the past 15 years. Yearly screening is stopped once you have quit smoking for at least 15 years or develop a health problem that would prevent you from having lung cancer  treatment.  Clinical breast exam.** / Every year after age 24 years.  BRCA-related cancer risk assessment.** / For women who have family members with a BRCA-related cancer (breast, ovarian, tubal, or peritoneal cancers).  Mammogram.** / Every year beginning at age 14 years and continuing for as long as you are in good health. Consult with your health care provider.  Pap test.** / Every 3 years starting at age 17 years through age 31 or 74 years with 3 consecutive normal Pap tests. Testing can be stopped between 65 and 70 years with 3 consecutive normal Pap tests and no abnormal Pap or HPV tests in the past 10 years.  HPV screening.** / Every 3 years from ages 30 years through ages 70 or 28 years with a history of 3 consecutive normal Pap tests. Testing can be stopped between 65 and 70 years with 3 consecutive normal Pap tests and no abnormal Pap or HPV tests in the past 10 years.  Fecal occult blood test (FOBT) of stool. / Every year beginning at age 64 years and continuing until age 92 years. You may not need to do this test if you get a colonoscopy every 10 years.  Flexible sigmoidoscopy or colonoscopy.** / Every 5 years for a flexible sigmoidoscopy or every 10 years for a colonoscopy beginning at age 73 years and continuing until age 39 years.  Hepatitis C blood test.** / For all people born from [birth year] through 1963 and any individual with known risks for hepatitis C.  Osteoporosis screening.** / A one-time screening for women ages 35 years and over and women at risk for fractures or osteoporosis.  Skin self-exam. / Monthly.  Influenza vaccine. / Every year.  Tetanus, diphtheria, and acellular pertussis (Tdap/Td) vaccine.** / 1 dose of Td every 10 years.  Varicella vaccine.** / Consult your health care provider.  Zoster vaccine.** / 1 dose for adults aged 59 years or older.  Pneumococcal 13-valent conjugate (PCV13) vaccine.** / Consult your health care provider.  Pneumococcal  polysaccharide (PPSV23) vaccine.** / 1 dose for all adults aged 8 years and older.  Meningococcal vaccine.** / Consult your health care provider.  Hepatitis A vaccine.** / Consult your health care provider.  Hepatitis B vaccine.** / Consult your health care provider.  Haemophilus influenzae type b (Hib) vaccine.** / Consult your health care provider. ** Family history and personal history of risk and conditions may change your health care provider's recommendations. Document Released: 01/16/2002 Document Revised: 11/25/2013 Document Reviewed: 04/17/2011 [facility] Patient Information 2015 [place], [place]. This information is not intended to replace advice given to you by your health care provider. Make sure you discuss any questions you have with your health care provider.

## 2014-06-12 NOTE — Assessment & Plan Note (Signed)
Worsening snoring, apnea, fatigue, depression and weight gain, will refer to pulmonology for consideration

## 2014-06-12 NOTE — Assessment & Plan Note (Addendum)
Check TSH today C/l hair loss, dry skin, fatigue, emotional lability, IBS, weight fluctuations

## 2014-06-12 NOTE — Assessment & Plan Note (Signed)
Needs surveillance Referred to derm

## 2014-06-12 NOTE — Assessment & Plan Note (Signed)
recurrent

## 2014-06-12 NOTE — Assessment & Plan Note (Signed)
Avoids dairy

## 2014-06-12 NOTE — Assessment & Plan Note (Signed)
Will start Lexapro and reassess at next visit.

## 2014-06-12 NOTE — Assessment & Plan Note (Signed)
Intermittent episodes at night with some sweating has set herself up with cardiology has an appt with Dr Harrington Challenger next month. Minimize caffeine and stressors

## 2014-06-12 NOTE — Assessment & Plan Note (Signed)
Avoid offending foods, start probiotics. Do not eat large meals in late evening and consider raising head of bed. Doing well with Omeprazole

## 2014-06-12 NOTE — Assessment & Plan Note (Signed)
May use Temazepam prn

## 2014-06-12 NOTE — Assessment & Plan Note (Signed)
Worse with anemia and low ferritin, using Clonazepam as needed

## 2014-06-12 NOTE — Progress Notes (Signed)
Patient ID: [name] Jensen, female   DOB: 06-30-1956, 58 y.o.   MRN: [id number] SHERI GATCHEL [id number] [birth year] 06/12/2014      Progress Note-Follow Up  Subjective  Chief Complaint  Chief Complaint  Patient presents with  . Establish Care    new patient    HPI  Patient is a 58 year old female in today for routine medical care. She is in today to establish care and has a very, daily medical history. She has a history of Hashimoto's thyroiditis. She reports normal thyroid blood work but fine-needle aspiration confirmed Hashimoto's. Last ultrasound was about 4 years ago. Over the last year she has lost her course of 23 years and is struggling with severe depression and anxiety. Has 6 significant fatigue and anhedonia. Denies suicidal ideation. She's been struggling with intermittent episodes of weight loss and weight gain. His hair loss dry skin and irritable bowel symptoms. She was taking Lamictal but stopped because it was not helpful. She's been struggling with tachycardia and episodes of diaphoresis at night. Has a long history of intermittent restless leg syndrome which is worse when she has apnea. She notes she's been snoring worse, struggling with headaches, struggling with sore throat, fatigue and malaise. Is using Prilosec for heartburn. Sees Dr Willey Blade for gastroenterology  Past Medical History  Diagnosis Date  . SVT (supraventricular tachycardia)   . Thyroid disease   . Chicken pox as a child  . Mumps as a child  . Hiatal hernia 2007  . Rectocele 06/12/2014  . 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 RLS (restless legs syndrome)   . Anemia     h/o low ferritin  . Anxiety   . Depression   . Asthma     environmental triggers  . GERD (gastroesophageal reflux disease)   . History of viral illness   . Lactose intolerance 06/12/2014  . Mild obstructive sleep apnea 06/12/2014    Patient reports this was diagnosed years ago after a  respiratory arrest s/p sedation for TAH  . Tachycardia 06/12/2014  . Adjustment reaction with anxiety and depression 06/12/2014  . Depression with anxiety 06/12/2014    Past Surgical History  Procedure Laterality Date  . Cholecystectomy    . Appendectomy    . Tonsillectomy  1972    2ith adenoid  . Wisdom tooth extraction  58 yrs old  . Abdominal hysterectomy  11-2008    total with cystocele, rectocele repair  . Skin surgery      right arm scc    Family History  Problem Relation Age of Onset  . Hypertension Mother   . Hypertension Father   . Hypertension Sister   . Diabetes Sister     type 2  . Fibromyalgia Sister   . GI Bleed Maternal Grandmother   . Atrial fibrillation Maternal Grandmother   . Heart disease Maternal Grandfather   . Diabetes Paternal Grandmother   . Stroke Paternal Grandfather   . Proteinuria Sister     History   Social History  . Marital Status: Single    Spouse Name: N/A    Number of Children: N/A  . Years of Education: N/A   Occupational History  . Not on file.   Social History Main Topics  . Smoking status: Never Smoker   . Smokeless tobacco: Not on file  . Alcohol Use: No  . Drug Use: No  . Sexual Activity: Not on file  Comment: lives by self with 4 dogs works at [employer], avoids dairy   Other Topics Concern  . Not on file   Social History Narrative  . No narrative on file    Current Outpatient Prescriptions on File Prior to Visit  Medication Sig Dispense Refill  . albuterol (PROVENTIL HFA;VENTOLIN HFA) 108 (90 BASE) MCG/ACT inhaler Inhale 2 puffs into the lungs every 6 (six) hours as needed for wheezing.      Marland Kitchen aspirin (ASPIRIN EC) 81 MG EC tablet Take 81 mg by mouth at bedtime.       . beclomethasone (QVAR) 80 MCG/ACT inhaler Inhale 1 puff into the lungs as needed (shotness of breath).       . lamoTRIgine (LAMICTAL) 200 MG tablet Take 300 mg by mouth every morning.        No current facility-administered medications on file  prior to visit.    Allergies  Allergen Reactions  . Dilaudid [Hydromorphone Hcl]     Respiratory , cardiac     Review of Systems  Review of Systems  Constitutional: Positive for malaise/fatigue and diaphoresis. Negative for fever and chills.  HENT: Negative for congestion, hearing loss and nosebleeds.   Eyes: Negative for discharge.  Respiratory: Negative for cough, sputum production, shortness of breath and wheezing.   Cardiovascular: Positive for palpitations. Negative for chest pain and leg swelling.  Gastrointestinal: Positive for diarrhea and constipation. Negative for heartburn, nausea, vomiting, abdominal pain and blood in stool.  Genitourinary: Negative for dysuria, urgency, frequency and hematuria.  Musculoskeletal: Negative for back pain, falls and myalgias.  Skin: Negative for rash.  Neurological: Positive for headaches. Negative for dizziness, tremors, sensory change, focal weakness, loss of consciousness and weakness.  Endo/Heme/Allergies: Negative for polydipsia. Does not bruise/bleed easily.  Psychiatric/Behavioral: Negative for depression and suicidal ideas. The patient is not nervous/anxious and does not have insomnia.     Objective  BP 102/78  Pulse 82  Temp(Src) 98.5 F (36.9 C) (Oral)  Ht 5\' 5"  (1.651 m)  Wt 182 lb 1.3 oz (82.591 kg)  BMI 30.30 kg/m2  SpO2 97%  Physical Exam  Physical Exam  Constitutional: She is oriented to person, place, and time and well-developed, well-nourished, and in no distress. No distress.  HENT:  Head: Normocephalic and atraumatic.  Right Ear: External ear normal.  Left Ear: External ear normal.  Nose: Nose normal.  Mouth/Throat: Oropharynx is clear and moist. No oropharyngeal exudate.  Eyes: Conjunctivae are normal. Pupils are equal, round, and reactive to light. Right eye exhibits no discharge. Left eye exhibits no discharge. No scleral icterus.  Neck: Normal range of motion. Neck supple. No thyromegaly present.   Cardiovascular: Normal rate, regular rhythm, normal heart sounds and intact distal pulses.   No murmur heard. Pulmonary/Chest: Effort normal and breath sounds normal. No respiratory distress. She has no wheezes. She has no rales.  Abdominal: Soft. Bowel sounds are normal. She exhibits no distension and no mass. There is no tenderness.  Musculoskeletal: Normal range of motion. She exhibits no edema and no tenderness.  Lymphadenopathy:    She has no cervical adenopathy.  Neurological: She is alert and oriented to person, place, and time. She has normal reflexes. No cranial nerve deficit. Coordination normal.  Skin: Skin is warm and dry. No rash noted. She is not diaphoretic.  Psychiatric: Mood, memory and affect normal.    No results found for this basename: TSH   Lab Results  Component Value Date   WBC 7.6 05/18/2013  HGB 12.3 05/18/2013   HCT 35.6* 05/18/2013   MCV 82.0 05/18/2013   PLT 172 05/18/2013   Lab Results  Component Value Date   CREATININE 0.54 05/18/2013   BUN 8 05/18/2013   NA 138 05/18/2013   K 3.6 05/18/2013   CL 102 05/18/2013   CO2 26 05/18/2013   Lab Results  Component Value Date   ALT 267* 05/18/2013   AST 289* 05/18/2013   ALKPHOS 416* 05/18/2013   BILITOT 0.5 05/18/2013     Assessment & Plan  SCC (squamous cell carcinoma) Needs surveillance Referred to derm  Cystocele recurrent  RESTLESS LEG SYNDROME Worse with anemia and low ferritin, using Clonazepam as needed   Thyroid disease Check TSH today C/l hair loss, dry skin, fatigue, emotional lability, IBS, weight fluctuations  Lactose intolerance Avoids dairy  Mild obstructive sleep apnea Worsening snoring, apnea, fatigue, depression and weight gain, will refer to pulmonology for consideration  Rectocele Recurrent s/p TAH and cystocele repair as well  Tachycardia Intermittent episodes at night with some sweating has set herself up with cardiology has an appt with Dr Harrington Challenger next month. 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   GERD (gastroesophageal reflux disease) Avoid offending foods, start probiotics. Do not eat large meals in late evening and consider raising head of bed. Doing well with Omeprazole

## 2014-06-17 ENCOUNTER — Ambulatory Visit (HOSPITAL_BASED_OUTPATIENT_CLINIC_OR_DEPARTMENT_OTHER)
Admission: RE | Admit: 2014-06-17 | Discharge: 2014-06-17 | Disposition: A | Discharge status: Home / Self Care | Payer: 59 | Source: Ambulatory Visit | Attending: Family Medicine | Admitting: Family Medicine

## 2014-06-17 ENCOUNTER — Other Ambulatory Visit (INDEPENDENT_AMBULATORY_CARE_PROVIDER_SITE_OTHER): Payer: 59

## 2014-06-17 DIAGNOSIS — R945 Abnormal results of liver function studies: Secondary | ICD-10-CM

## 2014-06-17 DIAGNOSIS — R635 Abnormal weight gain: Secondary | ICD-10-CM

## 2014-06-17 DIAGNOSIS — Z0000 Encounter for general adult medical examination without abnormal findings: Secondary | ICD-10-CM

## 2014-06-17 DIAGNOSIS — R51 Headache: Secondary | ICD-10-CM

## 2014-06-17 DIAGNOSIS — R5381 Other malaise: Secondary | ICD-10-CM

## 2014-06-17 DIAGNOSIS — K769 Liver disease, unspecified: Secondary | ICD-10-CM | POA: Insufficient documentation

## 2014-06-17 DIAGNOSIS — E042 Nontoxic multinodular goiter: Secondary | ICD-10-CM | POA: Insufficient documentation

## 2014-06-17 DIAGNOSIS — L659 Nonscarring hair loss, unspecified: Secondary | ICD-10-CM | POA: Insufficient documentation

## 2014-06-17 DIAGNOSIS — E063 Autoimmune thyroiditis: Secondary | ICD-10-CM | POA: Insufficient documentation

## 2014-06-17 DIAGNOSIS — G4730 Sleep apnea, unspecified: Secondary | ICD-10-CM | POA: Insufficient documentation

## 2014-06-17 DIAGNOSIS — R5383 Other fatigue: Secondary | ICD-10-CM

## 2014-06-17 DIAGNOSIS — R000 Tachycardia, unspecified: Secondary | ICD-10-CM | POA: Insufficient documentation

## 2014-06-17 LAB — RENAL FUNCTION PANEL
Albumin: 4.4 g/dL (ref 3.5–5.2)
BUN: 12 mg/dL (ref 6–23)
CHLORIDE: 104 meq/L (ref 96–112)
CO2: 25 mEq/L (ref 19–32)
Calcium: 9.5 mg/dL (ref 8.4–10.5)
Creatinine, Ser: 0.6 mg/dL (ref 0.4–1.2)
GFR: 112.11 mL/min (ref >60.00)
GLUCOSE: 112 mg/dL — AB (ref 70–99)
PHOSPHORUS: 3 mg/dL (ref 2.3–4.6)
POTASSIUM: 3.8 meq/L (ref 3.5–5.1)
SODIUM: 140 meq/L (ref 135–145)

## 2014-06-17 LAB — HEPATIC FUNCTION PANEL
ALK PHOS: 92 U/L (ref 39–117)
ALT: 27 U/L (ref 0–35)
AST: 23 U/L (ref 0–37)
Albumin: 4.4 g/dL (ref 3.5–5.2)
BILIRUBIN DIRECT: 0.1 mg/dL (ref 0.0–0.3)
BILIRUBIN TOTAL: 0.5 mg/dL (ref 0.2–1.2)
Total Protein: 7.7 g/dL (ref 6.0–8.3)

## 2014-06-17 LAB — CBC
HCT: 39.2 % (ref 36.0–46.0)
Hemoglobin: 13.4 g/dL (ref 12.0–15.0)
MCHC: 34 g/dL (ref 30.0–36.0)
MCV: 83.7 fl (ref 78.0–100.0)
Platelets: 310 10*3/uL (ref 150.0–400.0)
RBC: 4.69 Mil/uL (ref 3.87–5.11)
RDW: 13.2 % (ref 11.5–15.5)
WBC: 6.7 10*3/uL (ref 4.0–10.5)

## 2014-06-17 LAB — LIPID PANEL
CHOL/HDL RATIO: 4
Cholesterol: 216 mg/dL — ABNORMAL HIGH (ref 0–200)
HDL: 54.3 mg/dL (ref >39.00)
LDL Cholesterol: 124 mg/dL — ABNORMAL HIGH (ref 0–99)
NonHDL: 161.7
TRIGLYCERIDES: 187 mg/dL — AB (ref 0.0–149.0)
VLDL: 37.4 mg/dL (ref 0.0–40.0)

## 2014-06-17 LAB — TSH: TSH: 1.09 u[IU]/mL (ref 0.35–4.50)

## 2014-06-19 ENCOUNTER — Telehealth: Payer: Self-pay | Admitting: Physician Assistant

## 2014-06-19 DIAGNOSIS — E063 Autoimmune thyroiditis: Secondary | ICD-10-CM

## 2014-06-19 NOTE — Telephone Encounter (Signed)
Message copied by Raiford Noble on Fri Jun 19, 2014  3:46 PM ------      Message from: Rockwell Germany      Created: Fri Jun 19, 2014  3:40 PM      Regarding: Referral       Patient informed, understood & agreed; she is waiting on her records from [place] to get to our clinic, she has Hashimoto's Disease & has had [3] Needle Aspiration Bx[s] prior and would like to request appointment with Dr. Cruzita Lederer at [facility] preference per patient request]/SLS                  ----- Message -----         From: Leeanne Rio, PA-C         Sent: 06/17/2014   6:27 PM           To: Rockwell Germany, [place]            Answering for Dr. Charlett Blake who is out of town.  Labs are good overall.  Triglycerides are mildly elevated.  She should take a fish oil supplement if she is not already doing so.  Her thyroid function was normal.  Her US shows a multinodular goiter.  I know she has a history of thyroid disease but no details are in the chart.  I recommend she let us set her up with an Endocrinologist so that she can get further evaluation and treatment.  Please let me know if she is amenable to this.       ------

## 2014-06-19 NOTE — Telephone Encounter (Signed)
Referral placed.

## 2014-06-22 ENCOUNTER — Telehealth: Payer: Self-pay | Admitting: Family Medicine

## 2014-06-22 NOTE — Telephone Encounter (Signed)
Patient left message requesting to have Hemoglobin A1C added to her blood work from last week so that she will not have to be stuck again, states diabetes runs in her family and she would like to have this checked ASAP

## 2014-06-22 NOTE — Telephone Encounter (Signed)
Please advise. If ok to add, what dx code do we use?

## 2014-06-22 NOTE — Telephone Encounter (Signed)
Ok dx is hyperglycemia

## 2014-06-22 NOTE — Telephone Encounter (Signed)
Add on request form faxed to [facility] lab.

## 2014-06-23 ENCOUNTER — Ambulatory Visit (INDEPENDENT_AMBULATORY_CARE_PROVIDER_SITE_OTHER): Payer: 59 | Admitting: Internal Medicine

## 2014-06-23 ENCOUNTER — Ambulatory Visit: Payer: 59

## 2014-06-23 VITALS — BP 142/72 | HR 98 | Ht 65.0 in | Wt 181.0 lb

## 2014-06-23 DIAGNOSIS — R7309 Other abnormal glucose: Secondary | ICD-10-CM

## 2014-06-23 DIAGNOSIS — R079 Chest pain, unspecified: Secondary | ICD-10-CM

## 2014-06-23 LAB — HEMOGLOBIN A1C: Hgb A1c MFr Bld: 6.1 % (ref 4.6–6.5)

## 2014-06-23 NOTE — Patient Instructions (Signed)
Your physician has requested that you have a stress echocardiogram. For further information please visit [email or web address]. Please follow instruction sheet as given.  Your physician recommends that you continue on your current medications as directed. Please refer to the Current Medication list given to you today.  Your physician recommends that you schedule a follow-up appointment in: TBD  Thank you for choosing [facility]!!

## 2014-06-23 NOTE — Progress Notes (Signed)
HPI Patient is a 58 yo who presents for first time  Hx of SVT  S/p ablation 5 years aog   Remained tachy after  Inappropriate ST  Recomm b blocker On [place]   Found to have multinodular goiter ? Hashimotos Went to [place] chest tightness Here at work had some squeezing at work  Stressful About 1 month ago.      Took NTG at work  [employer]  2nd one went away  Occasionally has had at home  Walking  Had squeezing WIll stop after exercise will feel squeezing and fatigue  Was doing kickboxing.   Has woken up at night  Uncomfortable  Lasted  Last spell of tightness was walking dog briskly.     Verapamil for PVCs/ trigeminy  (LBB morphology)  Has not helped   Having daily    Allergies  Allergen Reactions  . Dilaudid [Hydromorphone Hcl]     Respiratory , cardiac     Current Outpatient Prescriptions  Medication Sig Dispense Refill  . albuterol (PROVENTIL HFA;VENTOLIN HFA) 108 (90 BASE) MCG/ACT inhaler Inhale 2 puffs into the lungs every 6 (six) hours as needed for wheezing.      Marland Kitchen aspirin (ASPIRIN EC) 81 MG EC tablet Take 81 mg by mouth at bedtime.       . beclomethasone (QVAR) 80 MCG/ACT inhaler Inhale 1 puff into the lungs as needed (shotness of breath).       . clonazePAM (KLONOPIN) 1 MG tablet Take 1 tablet (1 mg total) by mouth at bedtime as needed for anxiety.  30 tablet  1  . escitalopram (LEXAPRO) 10 MG tablet 1/2 tabs daily x 7 days then 1 tab po daily  30 tablet  3  . omeprazole (PRILOSEC) 20 MG capsule Take 1 capsule (20 mg total) by mouth daily.  90 capsule  3  . temazepam (RESTORIL) 30 MG capsule Take 1 capsule (30 mg total) by mouth at bedtime as needed for sleep.  30 capsule  1  . VERAPAMIL HCL PO Take 30 mg by mouth 2 (two) times daily.       No current facility-administered medications for this visit.    Past Medical History  Diagnosis Date  . SVT (supraventricular tachycardia)   . Thyroid disease   . Chicken pox as a child  . Mumps as a child  . Hiatal  hernia 2007  . Rectocele 06/12/2014  . 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 RLS (restless legs syndrome)   . Anemia     h/o low ferritin  . Anxiety   . Depression   . Asthma     environmental triggers  . GERD (gastroesophageal reflux disease)   . History of viral illness   . Lactose intolerance 06/12/2014  . 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 Tachycardia 06/12/2014  . Adjustment reaction with anxiety and depression 06/12/2014  . Depression with anxiety 06/12/2014    Past Surgical History  Procedure Laterality Date  . Cholecystectomy    . Appendectomy    . Tonsillectomy  1972    2ith adenoid  . Wisdom tooth extraction  58 yrs old  . Abdominal hysterectomy  11-2008    total with cystocele, rectocele repair  . Skin surgery      right arm scc    Family History  Problem Relation Age  of Onset  . Hypertension Mother   . Hypertension Father   . Hypertension Sister   . Diabetes Sister     type 2  . Fibromyalgia Sister   . GI Bleed Maternal Grandmother   . Atrial fibrillation Maternal Grandmother   . Heart disease Maternal Grandfather   . Diabetes Paternal Grandmother   . Stroke Paternal Grandfather   . Proteinuria Sister     History   Social History  . Marital Status: Divorced    Spouse Name: N/A    Number of Children: N/A  . Years of Education: N/A   Occupational History  . Not on file.   Social History Main Topics  . Smoking status: Never Smoker   . Smokeless tobacco: Not on file  . Alcohol Use: No  . Drug Use: No  . Sexual Activity: Not on file     Comment: lives by self with 4 dogs works at [employer], avoids dairy   Other Topics Concern  . Not on file   Social History Narrative  . No narrative on file    Review of Systems:  All systems reviewed.  They are negative to the above problem except as previously  stated.  Vital Signs: BP 142/72  Pulse 98  Ht 5\' 5"  (1.651 m)  Wt 181 lb (82.101 kg)  BMI 30.12 kg/m2  Physical Exam patinet is a 58 yo in NAD HEENT:  Normocephalic, atraumatic. EOMI, PERRLA.  Neck: JVP is normal.  No bruits.  Lungs: clear to auscultation. No rales no wheezes.  Heart: Regular rate and rhythm. Normal S1, S2. No S3.   No significant murmurs. PMI not displaced.  Abdomen:  Supple, nontender. Normal bowel sounds. No masses. No hepatomegaly.  Extremities:   Good distal pulses throughout. No lower extremity edema.  Musculoskeletal :moving all extremities.  Neuro:   alert and oriented x3.  CN II-XII grossly intact.  EKG:  March 9 :  ST 112 bpm  Occasional PVC  Nonspecific st t wave changes   Rhythm strip:  Trigeminy (LBBB morphology) Assessment and Plan: Patient is a 47 oy who presents for evaluation of palpitaotns.  She has had SVT and is s/p ablation.  After, she had  inapprop ST and was treated with b blocker Now with palpitations and tele showing PVCs (LBB morphology) ALso she is having spells of chest tightness which is new.  Not associated with palpitaitons.  In additon she has a multinodular goiter  Endocrine appt is pending  Recomm  WOuld recomm a stress echo to evaluate baseline LV size and function  Look at diastolic properties  Treadmill will see if ectopy increases Immed post stress echo to eval wall motion and also diastolic properties.  Patint again has appt with endo  WIll go next wk.

## 2014-06-24 ENCOUNTER — Encounter (HOSPITAL_COMMUNITY): Payer: Self-pay

## 2014-06-24 ENCOUNTER — Ambulatory Visit (HOSPITAL_COMMUNITY)
Admission: RE | Admit: 2014-06-24 | Discharge: 2014-06-24 | Disposition: A | Discharge status: Home / Self Care | Payer: 59 | Source: Ambulatory Visit | Attending: Internal Medicine | Admitting: Internal Medicine

## 2014-06-24 DIAGNOSIS — R072 Precordial pain: Secondary | ICD-10-CM

## 2014-06-24 DIAGNOSIS — R079 Chest pain, unspecified: Secondary | ICD-10-CM | POA: Insufficient documentation

## 2014-06-24 NOTE — Progress Notes (Signed)
Stress Lab Nurses Notes - [name] Jensen  [name] Jensen 06/24/2014 Reason for doing test: Arrhythmia/Palpitations and Chest Pain Type of test: Stress Echo Nurse performing test: Carvel Getting, RN Nuclear Medicine Tech: Not Applicable Echo Tech: Frederich Balding MD performing test: Wardell Heath Family MD: [facility] [address] Test explained and consent signed: Yes.   IV started: No IV started Symptoms: None Treatment/Intervention: None Reason test stopped: protocol completed and reached target HR After recovery IV was: No IV Patient to return to Nuc. Med at : Patient discharged: Home Patient's Condition upon discharge was: stable Comments: Patient walked 10 minutes on TM during stress echo. Rest HR was 101 and resting BP 128/79[place] Hr 171 and BP was 149/54 . Symptoms resolved during recovery [name]

## 2014-06-24 NOTE — Progress Notes (Signed)
*  PRELIMINARY RESULTS* Echocardiogram 2D Echocardiogram has been performed.  [name] 06/24/2014, 11:46 AM

## 2014-06-26 ENCOUNTER — Other Ambulatory Visit: Payer: Self-pay | Admitting: *Deleted

## 2014-06-26 ENCOUNTER — Telehealth: Payer: Self-pay | Admitting: *Deleted

## 2014-06-26 MED ORDER — METOPROLOL SUCCINATE ER 50 MG PO TB24: 50.0000 mg | ORAL_TABLET | Freq: Every day | ORAL | Status: DC

## 2014-06-26 NOTE — Telephone Encounter (Signed)
Spoke to patient concerning lab/test results/instructions from provider. Patient understood.    

## 2014-06-26 NOTE — Telephone Encounter (Signed)
Message copied by Truett Mainland on Fri Jun 26, 2014  9:23 AM ------      Message from: Dorris Carnes V      Created: Fri Jun 26, 2014  8:32 AM       Spoke to patient about echo      Very good exercise capacity      Mild diastolic dysfunction at baseline  Did not change significantly.      No signif arrhythmia.            Recom: Wait for endo visit  If thyroid not way off would sched for Holter monitor to quantify PVC burden.      She says she felt better on metoprolol in past.  Would recomm Toprol XL 50         Take 1/2 to start  D/C diltiazem ------

## 2014-06-29 ENCOUNTER — Ambulatory Visit (INDEPENDENT_AMBULATORY_CARE_PROVIDER_SITE_OTHER): Payer: 59 | Admitting: Internal Medicine

## 2014-06-29 ENCOUNTER — Encounter: Payer: Self-pay | Admitting: Internal Medicine

## 2014-06-29 VITALS — BP 136/90 | HR 103 | Temp 98.4°F | Resp 12 | Ht 64.5 in | Wt 185.0 lb

## 2014-06-29 DIAGNOSIS — E063 Autoimmune thyroiditis: Secondary | ICD-10-CM

## 2014-06-29 DIAGNOSIS — R000 Tachycardia, unspecified: Secondary | ICD-10-CM

## 2014-06-29 DIAGNOSIS — E042 Nontoxic multinodular goiter: Secondary | ICD-10-CM | POA: Insufficient documentation

## 2014-06-29 DIAGNOSIS — R635 Abnormal weight gain: Secondary | ICD-10-CM

## 2014-06-29 NOTE — Patient Instructions (Signed)
Please collect the 24 h urine for the cortisol level.  Patient information (Up-to-Date): Collection of a 24-hour urine specimen  - You should collect every drop of urine during each 24-hour period. It does not matter how much or little urine is passed each time, as long as every drop is collected. - Begin the urine collection in the morning after you wake up, after you have emptied your bladder for the first time. - Urinate (empty the bladder) for the first time and flush it down the toilet. Note the exact time (eg, 6:15 AM). You will begin the urine collection at this time. - Collect every drop of urine during the day and night in an empty collection bottle. Store the bottle at room temperature or in the refrigerator. - If you need to have a bowel movement, any urine passed with the bowel movement should be collected. Try not to include feces with the urine collection. If feces does get mixed in, do not try to remove the feces from the urine collection bottle. - Finish by collecting the first urine passed the next morning, adding it to the collection bottle. This should be within ten minutes before or after the time of the first morning void on the first day (which was flushed). In this example, you would try to void between 6:05 and 6:25 on the second day. - If you need to urinate one hour before the final collection time, drink a full glass of water so that you can void again at the appropriate time. If you have to urinate 20 minutes before, try to hold the urine until the proper time. - Please note the exact time of the final collection, even if it is not the same time as when collection began on day 1. - The bottle(s) may be kept at room temperature for a day or two, but should be kept cool or refrigerated for longer periods of time.  Please consider the following ways to cut down carbs and fat and increase fiber and micronutrients in your diet:  - substitute whole grain for white bread or  pasta - substitute brown rice for white rice - substitute 90-calorie flat bread pieces for slices of bread when possible - substitute sweet potatoes or yams for white potatoes - substitute humus for margarine - substitute tofu for cheese when possible - substitute almond or rice milk for regular milk (would not drink soy milk daily due to concern for soy estrogen influence on breast cancer risk) - substitute dark chocolate for other sweets when possible - substitute water - can add lemon or orange slices for taste - for diet sodas (artificial sweeteners will trick your body that you can eat sweets without getting calories and will lead you to overeating and weight gain in the long run) - do not skip breakfast or other meals (this will slow down the metabolism and will result in more weight gain over time)  - can try smoothies made from fruit and almond/rice milk in am instead of regular breakfast - can also try old-fashioned (not instant) oatmeal made with almond/rice milk in am - order the dressing on the side when eating salad at a restaurant (pour less than half of the dressing on the salad) - eat as little meat as possible - can try juicing, but should not forget that juicing will get rid of the fiber, so would alternate with eating raw veg./fruits or drinking smoothies - use as little oil as possible, even when using  olive oil - can dress a salad with a mix of balsamic vinegar and lemon juice, for e.g. - use agave nectar, stevia sugar, or regular sugar rather than artificial sweateners - steam or broil/roast veggies  - snack on veggies/fruit/nuts (unsalted, preferably) when possible, rather than processed foods - reduce or eliminate aspartame in diet (it is in diet sodas, chewing gum, etc) Read the labels!  Try to read Dr. Janene Harvey book: "Program for Reversing Diabetes" for the vegan concept and other ideas for healthy eating.  Plant-based diet materials: - Lectures (you  tube):  Alyssa Grove: "Breaking the Food Seduction"  Doug Lisle: "How to Lose Weight, without Losing Your Mind"  Shari Heritage: "What is Insulin Resistance" https://www.woods-mathews.com/ - Documentaries:  [place] over [employer], Sick and Nearly Dead  The [employer] of the [employer] and undernourished - Books:  Alyssa Grove: "Program for Reversing Diabetes"  Heath Gold: "The [place] Study"  Norma Fredrickson: "Supermarket Vegan" (cookbook) - Facebook pages:   Forks versus Knives  Vegucated  [place] Matters - Healthy nutrition info websites:  https://www.martin.info/

## 2014-06-29 NOTE — Progress Notes (Signed)
Patient ID: [name] Jensen, female   DOB: [birth year], 58 y.o.   MRN: [id number]   HPI  [name] Jensen is a 58 y.o.-year-old female, referred by Dr. Dorris Carnes, in consultation for Hashimoto thyroiditis (? If pt's tachycardia can be related to her thyroid ds).  Pt. has been dx with Hashimoto's disease in 2010. She had a thyroid U/S >> small thyroid nodules >> one was Bx'ed >> lymphocytic. The thyroid U/S was repeated (06/17/2014) >> small nodules were again noticed.  She was on Levothyroxine >> for the Hashimoto's ds >> stopped some time ago. TFTs have always been normal.  I reviewed pt's thyroid tests: Lab Results  Component Value Date   TSH 1.09 06/17/2014    Pt denies feeling nodules in neck, hoarseness, has some dysphagia/no odynophagia, SOB with lying down.  Pt describes: - + weight gain and loss, but mostly weight gain - + tachycardia - up to 135 bpm - + hot flushes at night (head and neck) - + fatigue - + constipation/+ diarrhea - with dairy - + dry skin - + hair falling - + anxiety/ +depression She has no FH of thyroid disorders. No FH of thyroid cancer.  No h/o radiation tx to head or neck. No recent use of iodine supplements.  I reviewed her chart and she also has a history of ehrlichiosis - tx with 21 days of Doxycycline. She had TAH + BSO 5 years ago. She had ablation for SVT (Dr Lovena Le). Last week she had a stress echogram >> diastolic dysfunction. She had a 24h cortisol level that was normal 5 years ago.  She will have a sleep study soon.  ROS: Constitutional: see HPI Eyes: no blurry vision, no xerophthalmia ENT: + sore throat, no nodules palpated in throat, + dysphagia/no odynophagia, no hoarseness Cardiovascular: no CP/+ SOB/+ palpitations/no leg swelling Respiratory: + cough/+ SOB/+ wheezing Gastrointestinal: + N/no V/no D/+C/+ heartburn Musculoskeletal: no muscle/+ joint aches (fingers) Skin: no rashes, + hair loss Neurological: no  tremors/numbness/tingling/dizziness Psychiatric: no depression/anxiety, + HA + low libido  Past Medical History  Diagnosis Date  . SVT (supraventricular tachycardia)   . Thyroid disease   . Chicken pox as a child  . Mumps as a child  . Hiatal hernia 2007  . Rectocele 06/12/2014  . 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 RLS (restless legs syndrome)   . Anemia     h/o low ferritin  . Anxiety   . Depression   . Asthma     environmental triggers  . GERD (gastroesophageal reflux disease)   . History of viral illness   . Lactose intolerance 06/12/2014  . 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 Tachycardia 06/12/2014  . Adjustment reaction with anxiety and depression 06/12/2014  . Depression with anxiety 06/12/2014   Past Surgical History  Procedure Laterality Date  . Cholecystectomy    . Appendectomy    . Tonsillectomy  1972    2ith adenoid  . Wisdom tooth extraction  58 yrs old  . Abdominal hysterectomy  11-2008    total with cystocele, rectocele repair  . Skin surgery      right arm scc   History   Social History  . Marital Status: Divorced    Spouse Name: N/A    Number of Children: 2   Occupational History  . RN cardiology -  [employer]   Social History Main Topics  . Smoking status: Never Smoker   . Smokeless tobacco: Not on file  . Alcohol Use: No  . Drug Use: No  . Sexual Activity: Not on file     Comment: lives by self with 4 dogs works at [employer], avoids dairy   Current Outpatient Prescriptions on File Prior to Visit  Medication Sig Dispense Refill  . albuterol (PROVENTIL HFA;VENTOLIN HFA) 108 (90 BASE) MCG/ACT inhaler Inhale 2 puffs into the lungs every 6 (six) hours as needed for wheezing.      Marland Kitchen aspirin (ASPIRIN EC) 81 MG EC tablet Take 81 mg by mouth at bedtime.       . beclomethasone (QVAR) 80 MCG/ACT inhaler Inhale  1 puff into the lungs as needed (shotness of breath).       . clonazePAM (KLONOPIN) 1 MG tablet Take 1 tablet (1 mg total) by mouth at bedtime as needed for anxiety.  30 tablet  1  . escitalopram (LEXAPRO) 10 MG tablet 1/2 tabs daily x 7 days then 1 tab po daily  30 tablet  3  . metoprolol succinate (TOPROL-XL) 50 MG 24 hr tablet Take 1 tablet (50 mg total) by mouth daily. Take with or immediately following a meal.  90 tablet  3  . omeprazole (PRILOSEC) 20 MG capsule Take 1 capsule (20 mg total) by mouth daily.  90 capsule  3  . temazepam (RESTORIL) 30 MG capsule Take 1 capsule (30 mg total) by mouth at bedtime as needed for sleep.  30 capsule  1   No current facility-administered medications on file prior to visit.   Allergies  Allergen Reactions  . Dilaudid [Hydromorphone Hcl]     Respiratory , cardiac    Family History  Problem Relation Age of Onset  . Hypertension Mother   . Hypertension Father   . Hypertension Sister   . Diabetes Sister     type 2  . Fibromyalgia Sister   . GI Bleed Maternal Grandmother   . Atrial fibrillation Maternal Grandmother   . Heart disease Maternal Grandfather   . Diabetes Paternal Grandmother   . Stroke Paternal Grandfather   . Proteinuria Sister    PE: BP 136/90  Pulse 103  Temp(Src) 98.4 F (36.9 C) (Oral)  Resp 12  Ht 5' 4.5" (1.638 m)  Wt 185 lb (83.915 kg)  BMI 31.28 kg/m2  SpO2 97% Wt Readings from Last 3 Encounters:  06/29/14 185 lb (83.915 kg)  06/23/14 181 lb (82.101 kg)  06/12/14 182 lb 1.3 oz (82.591 kg)   Constitutional: overweight, in NAD Eyes: PERRLA, EOMI, no exophthalmos ENT: moist mucous membranes, + symmetric thyromegaly, no cervical lymphadenopathy Cardiovascular: tachycardia, RR, No MRG Respiratory: CTA B Gastrointestinal: abdomen soft, NT, ND, BS+ Musculoskeletal: no deformities, strength intact in all 4 Skin: moist, warm, no rashes Neurological: no tremor with outstretched hands, DTR normal in all  4  ASSESSMENT: 1. Euthyroid Hashimoto's thyroiditis  2. Multiple thyroid nodules:  Right thyroid lobe Measurements: Normal in size measuring 5.0 x 1.1 x 1.7 cm.  Right, mid, posterior - 0.4 x 0.4 x 0.4 cm - hypoechoic, likely solid.  Right, inferior, medial - 0.3 x 0.3 x 0.2 cm - hypoechoic, likely solid.   Left thyroid lobe Measurements: Normal in size measuring 4.3 x 1.1 x 1.3 cm.  Left, superior, lateral - 0.3 x 0.2 x 0.2 cm - hypoechoic, solid  Left, inferior- 0.3 x 0.2 x 0.2 cm-hypoechoic, likely  solid   Isthmus Thickness: Normal in size measuring 0.3 cm in diameter. No discrete nodules are identified within the thyroid isthmus.   Lymphadenopathy None visualized.  IMPRESSION:  Findings compatible with multi nodular goiter. None of the measured  thyroid nodules currently meet imaging criteria to recommend  percutaneous sampling.  Electronically Signed  By: Sandi Mariscal M.D.  On: 06/17/2014 10:40   PLAN:  1. Patient with Hashimoto's thyroiditis, with persistently normal thyroid function tests; not on tx with Levothyroxine - We discussed about what the dx of Hashimoto's thyroiditis entails and the fact that having a higher titer of the antibodies can trigger periods in which she may feel slightly hypothyroid or have dysphagia, but unusual to have hyperthyroid sxs. She appears to have both hypo- and hyper-thyroid sxs but, with normal TFTs I doubt that they are related. We discussed that, since her TFTs are normal, there is no tx other than Ibuprofen when the thyroid becomes more inflamed. - we discussed about other possible causes for her weight gain, tachycardia, hot flushes: menopause is definitely the most obvious culprit, but we can also recheck her for Cushing's sd., although this is lower yield. Anxiety is another possibility - she appears anxious today. She is taking medication for this.  - we also discussed about diet and I suggested to try a vegan diet for at least 3 weeks for  a cleanse and see if she feels better after this - she agrees to try - given instructions - I will let her know about the urinary cortisol (this is apparently not covered by her insurance...) - she will follow with her PCP Re: at least annual thyroid tests and return as needed.  2. Multiple thyroid nodules - we reviewed the U/S report - none of the nodules are worrisome or large enough to be Bx'ed. - I assured the pt that these nodule would not be able to cause her sxs.

## 2014-07-13 ENCOUNTER — Ambulatory Visit: Payer: PRIVATE HEALTH INSURANCE | Admitting: Internal Medicine

## 2014-08-05 ENCOUNTER — Ambulatory Visit: Payer: 59 | Admitting: Internal Medicine

## 2014-08-05 ENCOUNTER — Encounter: Payer: Self-pay | Admitting: Pulmonary Disease

## 2014-08-05 ENCOUNTER — Encounter: Payer: Self-pay | Admitting: Internal Medicine

## 2014-08-05 ENCOUNTER — Ambulatory Visit (INDEPENDENT_AMBULATORY_CARE_PROVIDER_SITE_OTHER): Payer: 59 | Admitting: Pulmonary Disease

## 2014-08-05 VITALS — BP 140/94 | HR 120 | Temp 98.7°F | Ht 65.0 in | Wt 187.8 lb

## 2014-08-05 DIAGNOSIS — G4733 Obstructive sleep apnea (adult) (pediatric): Secondary | ICD-10-CM

## 2014-08-05 NOTE — Assessment & Plan Note (Signed)
The patient is having significant sleep disruption on top of her usual chronic insomnia. She feels that her restless leg syndrome is well controlled on her current medications, and her history is very suspicious for clinically significant obstructive sleep apnea. She has gained 40 pounds over the last 9 months, and describes significant daytime sleepiness. She will need to have another sleep study for diagnosis, and the patient is agreeable to this approach.

## 2014-08-05 NOTE — Patient Instructions (Signed)
Will schedule for a sleep study, and arrange followup once the results are available.  

## 2014-08-05 NOTE — Progress Notes (Signed)
Subjective:    Patient ID: [name] Jensen, female    DOB: 11/18/1956, 58 y.o.   MRN: [id number]  HPI The patient is a 58 year old female who I've been asked to see for worsening sleep issues. The patient has a history of chronic insomnia that has been long-standing but stable, as well as the restless leg syndrome that she feels is well controlled currently. Most recently, her daughter has noticed that she is having choking and gasping arousals, as well as loud snoring. The patient has also had choking arousals when catnapping, and has frequent awakenings at night. She is not rested in the mornings upon arising. She notes some sleep pressure at work with inactivity, but stays extremely busy overall. However, she has profound sleepiness when she gets home in the evening and sits down. The patient states that her weight is up 30 pounds over the last 9 months, and her Epworth score today is 13. It should be noted that she had a sleep study in 2009 that did not show sleep apnea, with an AHI of 1.4 events per hour. She also had a sleep study in 2010 that was also normal with an AHI of 1.3. However, she has gained significant weight since that time, and is now much more symptomatic.   Sleep Questionnaire What time do you typically go to bed?( Between what hours) 9-10pm 9-10pm at 1506 on 08/05/14 by Lilli Few, CMA How long does it take you to fall asleep? 2 hours 2 hours at 1506 on 08/05/14 by Lilli Few, CMA How many times during the night do you wake up? What time do you get out of bed to start your day? 0630 0630 at 1506 on 08/05/14 by Lilli Few, CMA Do you drive or operate heavy machinery in your occupation? No No at 1506 on 08/05/14 by Lilli Few, CMA How much has your weight changed (up or down) over the past two years? (In pounds) 40 lb (18.144 kg) 40 lb (18.144 kg) at 1506 on 08/05/14 by Lilli Few, CMA Have you ever had a sleep study  before? Yes Yes at 1506 on 08/05/14 by Lilli Few, CMA If yes, [facility] 1506 on 08/05/14 by Lilli Few, CMA If yes, date of study? 2008 2008 at 1506 on 08/05/14 by Lilli Few, CMA Do you currently use CPAP? No No at 1506 on 08/05/14 by Lilli Few, CMA If so, what pressure? Do you wear oxygen at any time? No No at 1506 on 08/05/14 by Lilli Few, CMA   Review of Systems  Constitutional: Positive for unexpected weight change. Negative for fever.  HENT: Positive for sore throat. Negative for congestion, dental problem, ear pain, nosebleeds, postnasal drip, rhinorrhea, sinus pressure, sneezing and trouble swallowing.   Eyes: Negative for redness and itching.  Respiratory: Positive for cough. Negative for chest tightness, shortness of breath and wheezing.   Cardiovascular: Negative for palpitations and leg swelling.  Gastrointestinal: Negative for nausea and vomiting.  Genitourinary: Negative for dysuria.  Musculoskeletal: Negative for joint swelling.  Skin: Negative for rash.  Neurological: Positive for headaches.  Hematological: Does not bruise/bleed easily.  Psychiatric/Behavioral: Negative for dysphoric mood. The patient is nervous/anxious.        Objective:   Physical Exam Constitutional:  Overweight female, no acute distress  HENT:  Nares patent without discharge  Oropharynx without exudate, palate and uvula are normal  Eyes:  Perrla, eomi, no scleral icterus  Neck:  No JVD, no TMG  Cardiovascular:  Tachy noted, regular rhythm, no rubs or gallops.  No murmurs        Intact distal pulses  Pulmonary :  Normal breath sounds, no stridor or respiratory distress   No rales, rhonchi, or wheezing  Abdominal:  Soft, nondistended, bowel sounds present.  No tenderness noted.   Musculoskeletal:  No lower extremity edema noted.  Lymph Nodes:  No cervical lymphadenopathy  noted  Skin:  No cyanosis noted  Neurologic:  Alert, appropriate, moves all 4 extremities without obvious deficit.         Assessment & Plan:

## 2014-08-19 ENCOUNTER — Encounter: Payer: Self-pay | Admitting: Family

## 2014-08-19 ENCOUNTER — Other Ambulatory Visit: Payer: Self-pay | Admitting: *Deleted

## 2014-08-19 ENCOUNTER — Encounter: Payer: 59 | Admitting: Family

## 2014-08-19 ENCOUNTER — Ambulatory Visit (HOSPITAL_BASED_OUTPATIENT_CLINIC_OR_DEPARTMENT_OTHER)
Admission: RE | Admit: 2014-08-19 | Discharge: 2014-08-19 | Disposition: A | Discharge status: Home / Self Care | Payer: 59 | Source: Ambulatory Visit | Attending: Family | Admitting: Family

## 2014-08-19 ENCOUNTER — Ambulatory Visit (INDEPENDENT_AMBULATORY_CARE_PROVIDER_SITE_OTHER): Payer: 59 | Admitting: Family

## 2014-08-19 VITALS — BP 124/78 | HR 68 | Temp 97.9°F | Resp 16 | Ht 65.0 in | Wt 189.6 lb

## 2014-08-19 DIAGNOSIS — F418 Other specified anxiety disorders: Secondary | ICD-10-CM

## 2014-08-19 DIAGNOSIS — E669 Obesity, unspecified: Secondary | ICD-10-CM | POA: Insufficient documentation

## 2014-08-19 DIAGNOSIS — Z1231 Encounter for screening mammogram for malignant neoplasm of breast: Secondary | ICD-10-CM | POA: Diagnosis present

## 2014-08-19 DIAGNOSIS — Z0000 Encounter for general adult medical examination without abnormal findings: Secondary | ICD-10-CM

## 2014-08-19 DIAGNOSIS — E663 Overweight: Secondary | ICD-10-CM

## 2014-08-19 DIAGNOSIS — K219 Gastro-esophageal reflux disease without esophagitis: Secondary | ICD-10-CM

## 2014-08-19 DIAGNOSIS — G2581 Restless legs syndrome: Secondary | ICD-10-CM

## 2014-08-19 DIAGNOSIS — H8110 Benign paroxysmal vertigo, unspecified ear: Secondary | ICD-10-CM

## 2014-08-19 DIAGNOSIS — F341 Dysthymic disorder: Secondary | ICD-10-CM

## 2014-08-19 DIAGNOSIS — R635 Abnormal weight gain: Secondary | ICD-10-CM

## 2014-08-19 DIAGNOSIS — H8113 Benign paroxysmal vertigo, bilateral: Secondary | ICD-10-CM

## 2014-08-19 HISTORY — DX: Overweight: E66.3

## 2014-08-19 MED ORDER — BUPROPION HCL ER (XL) 150 MG PO TB24: 150.0000 mg | ORAL_TABLET | Freq: Every day | ORAL | Status: DC

## 2014-08-19 MED ORDER — TEMAZEPAM 30 MG PO CAPS: 30.0000 mg | ORAL_CAPSULE | Freq: Every evening | ORAL | Status: DC | PRN

## 2014-08-19 MED ORDER — OMEPRAZOLE 40 MG PO CPDR: 40.0000 mg | DELAYED_RELEASE_CAPSULE | Freq: Every day | ORAL | Status: DC

## 2014-08-19 NOTE — Assessment & Plan Note (Signed)
Requesting rx for 40mg  once daily.

## 2014-08-19 NOTE — Assessment & Plan Note (Signed)
Uncontrolled. Trial of wellbutrin.

## 2014-08-19 NOTE — Telephone Encounter (Signed)
Pt came back after appt requesting refill of temazepam. Last Rx 06/12/14, #30 x 1 refill. Please advise.

## 2014-08-19 NOTE — Progress Notes (Signed)
Pre visit review using our clinic review tool, if applicable. No additional management support is needed unless otherwise documented below in the visit note. 

## 2014-08-19 NOTE — Patient Instructions (Signed)
You will be contacted about your referral for PT Vestibular rehab for vertigo, GI referral for colonoscopy and bone density. Let us know if you have not heard back in 1 week about these referrals. Complete lab work prior to leaving. Schedule mammogram on the first floor in imaging. Start wellbutrin. Follow up with Dr. Charlett Blake in 1 month.

## 2014-08-19 NOTE — Telephone Encounter (Signed)
Rx called to [facility].

## 2014-08-19 NOTE — Assessment & Plan Note (Signed)
Hx of thyroiditis, but her TFT's are normal and she is following with Endo. I actually think she is probably not eating enough calories a day. Advise 1200 calories/day, continue regular exercise.

## 2014-08-19 NOTE — Telephone Encounter (Signed)
OK to send 30 tabs zero refills.  

## 2014-08-19 NOTE — Assessment & Plan Note (Signed)
Uncontrolled. She is agreeable to referral to vestibular rehab.

## 2014-08-19 NOTE — Addendum Note (Signed)
Addended by: Modena Morrow D on: 08/19/2014 04:01 PM   Modules accepted: Orders

## 2014-08-19 NOTE — Progress Notes (Addendum)
Subjective:    Patient ID: [name] Jensen, female    DOB: 30-Jan-1956, 58 y.o.   MRN: [id number]  HPI  Ms. Brinkman is a 58 yr old female who presents today for cpx.  Patient presents today for complete physical. She is also due for follow up of her depression.  She has some concerns today as well.  Immunizations: will obtain flu shot from work. Diet: reports healthy diet, has cut out carbs Exercise:  3-4 times a week Colonoscopy: was told 5 yr follow up in 2007 Dexa: due Pap Smear: total hysterectomy Mammogram: due  Depression with anxiety- last visit she was started on lexapro. Did not note a significant improvement in her symptoms on lexapro and felt that it was causing her to have weight gain so she stopped.  Has hx inappropriate tachycardia. Reports that she had been having palpitations which she felt were anxiety related but she is now on extended release diltiazem. Reports HR is now normal and she is no longer having palpitations.  Having a lot of stress at work.  Not sleeping well. She is scheduled to have a sleep study. Also has RLS. + irritability. Reports that she was on lamictal in the past. She was also taking klonopin as needed. Denies SI/HI.   Vertigo- reports that she has vertigo with laying and sitting. No significant improvement with meclizine. Wants to know what else she can do for this.   Weight gain- eating 900 calories a day.    Wt Readings from Last 3 Encounters:  08/19/14 189 lb 9.6 oz (86.002 kg)  08/05/14 187 lb 12.8 oz (85.186 kg)  06/29/14 185 lb (83.915 kg)    Review of Systems    see HPI  Past Medical History  Diagnosis Date  . SVT (supraventricular tachycardia)   . Thyroid disease   . Chicken pox as a child  . Mumps as a child  . Hiatal hernia 2007  . Rectocele 06/12/2014  . 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 RLS (restless legs syndrome)   . Anemia     h/o low ferritin  . Anxiety   . Depression   .  Asthma     environmental triggers  . GERD (gastroesophageal reflux disease)   . History of viral illness   . Lactose intolerance 06/12/2014  . Tachycardia 06/12/2014  . Adjustment reaction with anxiety and depression 06/12/2014  . Depression with anxiety 06/12/2014    History   Social History  . Marital Status: Divorced    Spouse Name: N/A    Number of Children: N/A  . Years of Education: N/A   Occupational History  . RN [employer]   Social History Main Topics  . Smoking status: Never Smoker   . Smokeless tobacco: Not on file  . Alcohol Use: No  . Drug Use: No  . Sexual Activity: Not on file     Comment: lives by self with 4 dogs works at [employer], avoids dairy   Other Topics Concern  . Not on file   Social History Narrative  . No narrative on file    Past Surgical History  Procedure Laterality Date  . Cholecystectomy    . Appendectomy    . Tonsillectomy  1972    2ith adenoid  . Wisdom tooth extraction  58 yrs old  . Abdominal hysterectomy  11-2008    total with cystocele, rectocele repair  . Skin surgery  right arm scc    Family History  Problem Relation Age of Onset  . Hypertension Mother   . Hypertension Father   . Hypertension Sister   . Diabetes Sister     type 2  . Fibromyalgia Sister   . GI Bleed Maternal Grandmother   . Atrial fibrillation Maternal Grandmother   . Heart disease Maternal Grandfather   . Diabetes Paternal Grandmother   . Stroke Paternal Grandfather   . Proteinuria Sister     Allergies  Allergen Reactions  . Dilaudid [Hydromorphone Hcl]     Respiratory , cardiac     Current Outpatient Prescriptions on File Prior to Visit  Medication Sig Dispense Refill  . albuterol (PROVENTIL HFA;VENTOLIN HFA) 108 (90 BASE) MCG/ACT inhaler Inhale 2 puffs into the lungs every 6 (six) hours as needed for wheezing.      Marland Kitchen aspirin (ASPIRIN EC) 81 MG EC tablet Take 81 mg by mouth at bedtime.       . beclomethasone (QVAR) 80 MCG/ACT inhaler  Inhale 1 puff into the lungs as needed (shotness of breath).       . clonazePAM (KLONOPIN) 1 MG tablet Take 1 tablet (1 mg total) by mouth at bedtime as needed for anxiety.  30 tablet  1  . diltiazem (CARDIZEM CD) 120 MG 24 hr capsule Take 120 mg by mouth daily.      Marland Kitchen omeprazole (PRILOSEC) 20 MG capsule Take 1 capsule (20 mg total) by mouth daily.  90 capsule  3  . temazepam (RESTORIL) 30 MG capsule Take 1 capsule (30 mg total) by mouth at bedtime as needed for sleep.  30 capsule  1   No current facility-administered medications on file prior to visit.    BP 124/78  Pulse 68  Temp(Src) 97.9 F (36.6 C) (Oral)  Resp 16  Ht 5\' 5"  (1.651 m)  Wt 189 lb 9.6 oz (86.002 kg)  BMI 31.55 kg/m2  SpO2 99%    Objective:   Physical Exam  Physical Exam  Constitutional: She is oriented to person, place, and time. She appears well-developed and well-nourished. No distress.  HENT:  Head: Normocephalic and atraumatic.  Right Ear: Tympanic membrane and ear canal normal.  Left Ear: Tympanic membrane and ear canal normal.  Mouth/Throat: Oropharynx is clear and moist.  Eyes: Pupils are equal, round, and reactive to light. No scleral icterus.  Neck: Normal range of motion. No thyromegaly present.  Cardiovascular: Normal rate and regular rhythm.   No murmur heard. Pulmonary/Chest: Effort normal and breath sounds normal. No respiratory distress. He has no wheezes. She has no rales. She exhibits no tenderness.  Abdominal: Soft. Bowel sounds are normal. He exhibits no distension and no mass. There is no tenderness. There is no rebound and no guarding.  Musculoskeletal: She exhibits no edema.  Lymphadenopathy:    She has no cervical adenopathy.  Neurological: She is alert and oriented to person, place, and time.  She exhibits normal muscle tone. Coordination normal.  Skin: Skin is warm and dry.  Psychiatric: She has a normal mood and affect. Her behavior is normal. Judgment and thought content normal.    Breasts: Examined lying Right: Without masses, retractions, discharge or axillary adenopathy.  Left: Without masses, retractions, discharge or axillary adenopathy.  Pelvic: deferred.  (s/p TAH/BSO)        Assessment & Plan:         Assessment & Plan:  Pt with hx of vitamin D deficiency and at increased risk for  osteoporosis.  Will obtain dexa to evaluate bone density.

## 2014-08-19 NOTE — Assessment & Plan Note (Signed)
Discussed healthy diet, exercise, weight loss.  Refer for bone density, mammogram, colo,

## 2014-08-20 ENCOUNTER — Telehealth: Payer: Self-pay | Admitting: Family

## 2014-08-20 DIAGNOSIS — E559 Vitamin D deficiency, unspecified: Secondary | ICD-10-CM

## 2014-08-20 LAB — URINALYSIS, ROUTINE W REFLEX MICROSCOPIC
BILIRUBIN URINE: NEGATIVE
KETONES UR: NEGATIVE
Leukocytes, UA: NEGATIVE
Nitrite: NEGATIVE
PH: 7 (ref 5.0–8.0)
Specific Gravity, Urine: 1.015 (ref 1.000–1.030)
TOTAL PROTEIN, URINE-UPE24: NEGATIVE
URINE GLUCOSE: NEGATIVE
Urobilinogen, UA: 0.2 (ref 0.0–1.0)

## 2014-08-20 LAB — VITAMIN D 25 HYDROXY (VIT D DEFICIENCY, FRACTURES): VITD: 25.95 ng/mL — ABNORMAL LOW (ref 30.00–100.00)

## 2014-08-20 MED ORDER — VITAMIN D (ERGOCALCIFEROL) 1.25 MG (50000 UNIT) PO CAPS: 50000.0000 [IU] | ORAL_CAPSULE | ORAL | Status: DC

## 2014-08-20 NOTE — Telephone Encounter (Signed)
Please contact pt and let her know that vitamin D is low.  Please start weekly vitamin D rx and plan to repeat vitamin D level in 3 months, dx vit d deficiency.

## 2014-08-21 NOTE — Telephone Encounter (Signed)
Notified pt and she voices understanding. She will call back in 3 months to schedule lab appt. Future lab order entered.

## 2014-08-21 NOTE — Telephone Encounter (Signed)
Pt called, call back @ [phone] and call around 10:00 am

## 2014-08-21 NOTE — Telephone Encounter (Signed)
Left message on voicemail to return my call.  

## 2014-08-24 ENCOUNTER — Ambulatory Visit (INDEPENDENT_AMBULATORY_CARE_PROVIDER_SITE_OTHER)
Admission: RE | Admit: 2014-08-24 | Discharge: 2014-08-24 | Disposition: A | Discharge status: Home / Self Care | Payer: 59 | Source: Ambulatory Visit | Attending: Family Medicine | Admitting: Family Medicine

## 2014-08-24 DIAGNOSIS — E559 Vitamin D deficiency, unspecified: Secondary | ICD-10-CM

## 2014-08-24 DIAGNOSIS — Z13820 Encounter for screening for osteoporosis: Secondary | ICD-10-CM

## 2014-08-24 DIAGNOSIS — Z0000 Encounter for general adult medical examination without abnormal findings: Secondary | ICD-10-CM

## 2014-08-31 ENCOUNTER — Encounter: Payer: Self-pay | Admitting: Family

## 2014-09-15 ENCOUNTER — Other Ambulatory Visit: Payer: Self-pay | Admitting: Family

## 2014-09-15 NOTE — Telephone Encounter (Signed)
Temazepam rx printed and forwarded to PRovider for signature.

## 2014-09-22 ENCOUNTER — Telehealth: Payer: Self-pay | Admitting: Internal Medicine

## 2014-09-23 ENCOUNTER — Ambulatory Visit: Payer: 59 | Admitting: Family

## 2014-09-25 ENCOUNTER — Encounter: Payer: Self-pay | Admitting: Nurse Practitioner

## 2014-09-28 ENCOUNTER — Ambulatory Visit (INDEPENDENT_AMBULATORY_CARE_PROVIDER_SITE_OTHER): Payer: 59 | Admitting: Family Medicine

## 2014-09-28 ENCOUNTER — Encounter: Payer: Self-pay | Admitting: Family Medicine

## 2014-09-28 VITALS — BP 126/59 | HR 93 | Temp 98.2°F | Ht 70.0 in | Wt 186.0 lb

## 2014-09-28 DIAGNOSIS — R197 Diarrhea, unspecified: Secondary | ICD-10-CM

## 2014-09-28 DIAGNOSIS — R1084 Generalized abdominal pain: Secondary | ICD-10-CM

## 2014-09-28 DIAGNOSIS — K219 Gastro-esophageal reflux disease without esophagitis: Secondary | ICD-10-CM

## 2014-09-28 DIAGNOSIS — R000 Tachycardia, unspecified: Secondary | ICD-10-CM

## 2014-09-28 DIAGNOSIS — G4733 Obstructive sleep apnea (adult) (pediatric): Secondary | ICD-10-CM

## 2014-09-28 LAB — CBC
HCT: 41.3 % (ref 36.0–46.0)
HEMOGLOBIN: 13.6 g/dL (ref 12.0–15.0)
MCHC: 33 g/dL (ref 30.0–36.0)
MCV: 84.1 fl (ref 78.0–100.0)
PLATELETS: 343 10*3/uL (ref 150.0–400.0)
RBC: 4.91 Mil/uL (ref 3.87–5.11)
RDW: 13.4 % (ref 11.5–15.5)
WBC: 8.8 10*3/uL (ref 4.0–10.5)

## 2014-09-28 LAB — RENAL FUNCTION PANEL
Albumin: 4 g/dL (ref 3.5–5.2)
BUN: 11 mg/dL (ref 6–23)
CHLORIDE: 106 meq/L (ref 96–112)
CO2: 18 meq/L — AB (ref 19–32)
Calcium: 9.6 mg/dL (ref 8.4–10.5)
Creatinine, Ser: 0.7 mg/dL (ref 0.4–1.2)
GFR: 91.94 mL/min (ref >60.00)
Glucose, Bld: 95 mg/dL (ref 70–99)
Phosphorus: 3.9 mg/dL (ref 2.3–4.6)
Potassium: 3.8 mEq/L (ref 3.5–5.1)
Sodium: 141 mEq/L (ref 135–145)

## 2014-09-28 LAB — HEPATIC FUNCTION PANEL
ALK PHOS: 109 U/L (ref 39–117)
ALT: 24 U/L (ref 0–35)
AST: 22 U/L (ref 0–37)
Albumin: 4 g/dL (ref 3.5–5.2)
Bilirubin, Direct: 0 mg/dL (ref 0.0–0.3)
Total Bilirubin: 0.3 mg/dL (ref 0.2–1.2)
Total Protein: 8.3 g/dL (ref 6.0–8.3)

## 2014-09-28 MED ORDER — RANITIDINE HCL 300 MG PO TABS: 300.0000 mg | ORAL_TABLET | Freq: Every evening | ORAL | Status: DC | PRN

## 2014-09-28 MED ORDER — PANTOPRAZOLE SODIUM 40 MG PO TBEC: 40.0000 mg | DELAYED_RELEASE_TABLET | Freq: Every day | ORAL | Status: DC

## 2014-09-28 MED ORDER — MONTELUKAST SODIUM 10 MG PO TABS: 10.0000 mg | ORAL_TABLET | Freq: Every evening | ORAL | Status: DC | PRN

## 2014-09-28 MED ORDER — HYOSCYAMINE SULFATE 0.125 MG SL SUBL: 0.1250 mg | SUBLINGUAL_TABLET | SUBLINGUAL | Status: DC | PRN

## 2014-09-28 NOTE — Patient Instructions (Signed)
Encouraged increased rest and hydration, add probiotics Digestive Advantage by SchiffUpper Respiratory Infection, Adult An upper respiratory infection (URI) is also sometimes known as the common cold. The upper respiratory tract includes the nose, sinuses, throat, trachea, and bronchi. Bronchi are the airways leading to the lungs. Most people improve within 1 week, but symptoms can last up to 2 weeks. A residual cough may last even longer.  CAUSES Many different viruses can infect the tissues lining the upper respiratory tract. The tissues become irritated and inflamed and often become very moist. Mucus production is also common. A cold is contagious. You can easily spread the virus to others by oral contact. This includes kissing, sharing a glass, coughing, or sneezing. Touching your mouth or nose and then touching a surface, which is then touched by another person, can also spread the virus. SYMPTOMS  Symptoms typically develop 1 to 3 days after you come in contact with a cold virus. Symptoms vary from person to person. They may include:  Runny nose.  Sneezing.  Nasal congestion.  Sinus irritation.  Sore throat.  Loss of voice (laryngitis).  Cough.  Fatigue.  Muscle aches.  Loss of appetite.  Headache.  Low-grade fever. DIAGNOSIS  You might diagnose your own cold based on familiar symptoms, since most people get a cold 2 to 3 times a year. Your caregiver can confirm this based on your exam. Most importantly, your caregiver can check that your symptoms are not due to another disease such as strep throat, sinusitis, pneumonia, asthma, or epiglottitis. Blood tests, throat tests, and X-rays are not necessary to diagnose a common cold, but they may sometimes be helpful in excluding other more serious diseases. Your caregiver will decide if any further tests are required. RISKS AND COMPLICATIONS  You may be at risk for a more severe case of the common cold if you smoke cigarettes, have  chronic heart disease (such as heart failure) or lung disease (such as asthma), or if you have a weakened immune system. The very young and very old are also at risk for more serious infections. Bacterial sinusitis, middle ear infections, and bacterial pneumonia can complicate the common cold. The common cold can worsen asthma and chronic obstructive pulmonary disease (COPD). Sometimes, these complications can require emergency medical care and may be life-threatening. PREVENTION  The best way to protect against getting a cold is to practice good hygiene. Avoid oral or hand contact with people with cold symptoms. Wash your hands often if contact occurs. There is no clear evidence that vitamin C, vitamin E, echinacea, or exercise reduces the chance of developing a cold. However, it is always recommended to get plenty of rest and practice good nutrition. TREATMENT  Treatment is directed at relieving symptoms. There is no cure. Antibiotics are not effective, because the infection is caused by a virus, not by bacteria. Treatment may include:  Increased fluid intake. Sports drinks offer valuable electrolytes, sugars, and fluids.  Breathing heated mist or steam (vaporizer or shower).  Eating chicken soup or other clear broths, and maintaining good nutrition.  Getting plenty of rest.  Using gargles or lozenges for comfort.  Controlling fevers with ibuprofen or acetaminophen as directed by your caregiver.  Increasing usage of your inhaler if you have asthma. Zinc gel and zinc lozenges, taken in the first 24 hours of the common cold, can shorten the duration and lessen the severity of symptoms. Pain medicines may help with fever, muscle aches, and throat pain. A variety of non-prescription medicines  are available to treat congestion and runny nose. Your caregiver can make recommendations and may suggest nasal or lung inhalers for other symptoms.  HOME CARE INSTRUCTIONS   Only take over-the-counter or  prescription medicines for pain, discomfort, or fever as directed by your caregiver.  Use a warm mist humidifier or inhale steam from a shower to increase air moisture. This may keep secretions moist and make it easier to breathe.  Drink enough water and fluids to keep your urine clear or pale yellow.  Rest as needed.  Return to work when your temperature has returned to normal or as your caregiver advises. You may need to stay home longer to avoid infecting others. You can also use a face mask and careful hand washing to prevent spread of the virus. SEEK MEDICAL CARE IF:   After the first few days, you feel you are getting worse rather than better.  You need your caregiver's advice about medicines to control symptoms.  You develop chills, worsening shortness of breath, or brown or red sputum. These may be signs of pneumonia.  You develop yellow or brown nasal discharge or pain in the face, especially when you bend forward. These may be signs of sinusitis.  You develop a fever, swollen neck glands, pain with swallowing, or white areas in the back of your throat. These may be signs of strep throat. SEEK IMMEDIATE MEDICAL CARE IF:   You have a fever.  You develop severe or persistent headache, ear pain, sinus pain, or chest pain.  You develop wheezing, a prolonged cough, cough up blood, or have a change in your usual mucus (if you have chronic lung disease).  You develop sore muscles or a stiff neck. Document Released: 05/16/2001 Document Revised: 02/12/2012 Document Reviewed: 02/25/2014 [facility] Patient Information 2015 [place], [place]. This information is not intended to replace advice given to you by your health care provider. Make sure you discuss any questions you have with your health care provider. , zinc such as Coldeze or Xicam. Treat fevers as needed. Consider Elderberry liquid,

## 2014-09-28 NOTE — Progress Notes (Signed)
Patient ID: [name] Jensen, female   DOB: Jul 29, 1956, 58 y.o.   MRN: [id number] DIANNIE WILLNER [id number] [birth year] 09/28/2014      Progress Note-Follow Up  Subjective  Chief Complaint  Chief Complaint  Patient presents with  . Follow-up    6 month    HPI  Patient is a 58 year old female in today for routine medical care. She has been struggling with diarrhea over this past month, saw Dr Tami Lin on October 12 and is improving. Has proceedw ith pulmonogy and sleep evaluation. No new complaints or illness. Denies CP/palp/SOB/HA/congestion/fevers/GI or GU c/o. Taking meds as prescribed  Past Medical History  Diagnosis Date  . SVT (supraventricular tachycardia)   . Thyroid disease   . Chicken pox as a child  . Mumps as a child  . Hiatal hernia 2007  . Rectocele 06/12/2014  . 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 RLS (restless legs syndrome)   . Anemia     h/o low ferritin  . Anxiety   . Depression   . Asthma     environmental triggers  . GERD (gastroesophageal reflux disease)   . History of viral illness   . Lactose intolerance 06/12/2014  . Tachycardia 06/12/2014  . Adjustment reaction with anxiety and depression 06/12/2014  . Depression with anxiety 06/12/2014    Past Surgical History  Procedure Laterality Date  . Cholecystectomy    . Appendectomy    . Tonsillectomy  1972    2ith adenoid  . Wisdom tooth extraction  58 yrs old  . Abdominal hysterectomy  11-2008    total with cystocele, rectocele repair  . Skin surgery      right arm scc    Family History  Problem Relation Age of Onset  . Hypertension Mother   . Hypertension Father   . Hypertension Sister   . Diabetes Sister     type 2  . Fibromyalgia Sister   . GI Bleed Maternal Grandmother   . Atrial fibrillation Maternal Grandmother   . Heart disease Maternal Grandfather   . Diabetes Paternal Grandmother   . Stroke Paternal Grandfather   . Proteinuria Sister      History   Social History  . Marital Status: Divorced    Spouse Name: N/A    Number of Children: N/A  . Years of Education: N/A   Occupational History  . RN [employer]   Social History Main Topics  . Smoking status: Never Smoker   . Smokeless tobacco: Not on file  . Alcohol Use: No  . Drug Use: No  . Sexual Activity: Not on file     Comment: lives by self with 4 dogs works at [employer], avoids dairy   Other Topics Concern  . Not on file   Social History Narrative  . No narrative on file    Current Outpatient Prescriptions on File Prior to Visit  Medication Sig Dispense Refill  . albuterol (PROVENTIL HFA;VENTOLIN HFA) 108 (90 BASE) MCG/ACT inhaler Inhale 2 puffs into the lungs every 6 (six) hours as needed for wheezing.      Marland Kitchen aspirin (ASPIRIN EC) 81 MG EC tablet Take 81 mg by mouth at bedtime.       . beclomethasone (QVAR) 80 MCG/ACT inhaler Inhale 1 puff into the lungs as needed (shotness of breath).       Marland Kitchen buPROPion (WELLBUTRIN XL) 150 MG 24 hr tablet TAKE 1 TABLET BY MOUTH DAILY.  30 tablet  2  . clonazePAM (KLONOPIN) 1 MG tablet Take 1 tablet (1 mg total) by mouth at bedtime as needed for anxiety.  30 tablet  1  . diltiazem (CARDIZEM CD) 120 MG 24 hr capsule Take 120 mg by mouth daily.      Marland Kitchen diltiazem (CARDIZEM) 60 MG tablet Take 60 mg by mouth 2 (two) times daily.      Marland Kitchen omeprazole (PRILOSEC) 40 MG capsule Take 1 capsule (40 mg total) by mouth daily.  30 capsule  3  . temazepam (RESTORIL) 30 MG capsule TAKE 1 CAPSULE BY MOUTH DAILY AT BEDTIME  30 capsule  0  . Vitamin D, Ergocalciferol, (DRISDOL) 50000 UNITS CAPS capsule Take 1 capsule (50,000 Units total) by mouth every 7 (seven) days.  12 capsule  0   No current facility-administered medications on file prior to visit.    Allergies  Allergen Reactions  . Dilaudid [Hydromorphone Hcl]     Respiratory , cardiac     Review of Systems  Review of Systems  Constitutional: Positive for malaise/fatigue.  Negative for fever.  HENT: Negative for congestion.   Eyes: Negative for discharge.  Respiratory: Negative for shortness of breath.   Cardiovascular: Negative for chest pain, palpitations and leg swelling.  Gastrointestinal: Negative for nausea, abdominal pain and diarrhea.  Genitourinary: Negative for dysuria.  Musculoskeletal: Negative for falls.  Skin: Negative for rash.  Neurological: Negative for loss of consciousness and headaches.  Endo/Heme/Allergies: Negative for polydipsia.  Psychiatric/Behavioral: Positive for depression. Negative for suicidal ideas. The patient is not nervous/anxious and does not have insomnia.     Objective  BP 126/59  Pulse 93  Temp(Src) 98.2 F (36.8 C) (Oral)  Ht 5\' 10"  (1.778 m)  Wt 186 lb (84.369 kg)  BMI 26.69 kg/m2  SpO2 93%  Physical Exam  Physical Exam  Constitutional: She is oriented to person, place, and time and well-developed, well-nourished, and in no distress. No distress.  HENT:  Head: Normocephalic and atraumatic.  Eyes: Conjunctivae are normal.  Neck: Neck supple. No thyromegaly present.  Cardiovascular: Normal rate, regular rhythm and normal heart sounds.   No murmur heard. Pulmonary/Chest: Effort normal and breath sounds normal. She has no wheezes.  Abdominal: She exhibits no distension and no mass.  Musculoskeletal: She exhibits no edema.  Lymphadenopathy:    She has no cervical adenopathy.  Neurological: She is alert and oriented to person, place, and time.  Skin: Skin is warm and dry. No rash noted. She is not diaphoretic.  Psychiatric: Memory, affect and judgment normal.    Lab Results  Component Value Date   TSH 1.09 06/17/2014   Lab Results  Component Value Date   WBC 6.7 06/17/2014   HGB 13.4 06/17/2014   HCT 39.2 06/17/2014   MCV 83.7 06/17/2014   PLT 310.0 06/17/2014   Lab Results  Component Value Date   CREATININE 0.6 06/17/2014   BUN 12 06/17/2014   NA 140 06/17/2014   K 3.8 06/17/2014   CL 104 06/17/2014    CO2 25 06/17/2014   Lab Results  Component Value Date   ALT 27 06/17/2014   AST 23 06/17/2014   ALKPHOS 92 06/17/2014   BILITOT 0.5 06/17/2014   Lab Results  Component Value Date   CHOL 216* 06/17/2014   Lab Results  Component Value Date   HDL 54.30 06/17/2014   Lab Results  Component Value Date   LDLCALC 124* 06/17/2014   Lab Results  Component Value Date  TRIG 187.0* 06/17/2014   Lab Results  Component Value Date   CHOLHDL 4 06/17/2014     Assessment & Plan  Tachycardia Mild, avoid caffeine and continue current meds.   GERD (gastroesophageal reflux disease) Avoid offending foods, start probiotics. Do not eat large meals in late evening and consider raising head of bed.   Diarrhea Continue with gastroenterology, sees Dr Tami Lin. Encouraged Benefiber and probiotics.  OSA (obstructive sleep apnea) Encouraged ongoing use of CPAP

## 2014-09-28 NOTE — Progress Notes (Signed)
Pre visit review using our clinic review tool, if applicable. No additional management support is needed unless otherwise documented below in the visit note. 

## 2014-10-02 ENCOUNTER — Encounter (HOSPITAL_BASED_OUTPATIENT_CLINIC_OR_DEPARTMENT_OTHER): Payer: 59

## 2014-10-04 ENCOUNTER — Encounter: Payer: Self-pay | Admitting: Internal Medicine

## 2014-10-04 DIAGNOSIS — Z800 Family history of malignant neoplasm of digestive organs: Secondary | ICD-10-CM | POA: Insufficient documentation

## 2014-10-04 DIAGNOSIS — K5900 Constipation, unspecified: Secondary | ICD-10-CM | POA: Insufficient documentation

## 2014-10-04 DIAGNOSIS — R197 Diarrhea, unspecified: Secondary | ICD-10-CM | POA: Insufficient documentation

## 2014-10-04 NOTE — Assessment & Plan Note (Signed)
Continue with gastroenterology, sees Dr Tami Lin. Encouraged Benefiber and probiotics.

## 2014-10-04 NOTE — Assessment & Plan Note (Signed)
Encouraged ongoing use of CPAP

## 2014-10-04 NOTE — Assessment & Plan Note (Signed)
Avoid offending foods, start probiotics. Do not eat large meals in late evening and consider raising head of bed.  

## 2014-10-04 NOTE — Assessment & Plan Note (Signed)
Mild, avoid caffeine and continue current meds.

## 2014-10-07 ENCOUNTER — Encounter: Payer: Self-pay | Admitting: Nurse Practitioner

## 2014-10-07 ENCOUNTER — Ambulatory Visit (INDEPENDENT_AMBULATORY_CARE_PROVIDER_SITE_OTHER): Payer: 59 | Admitting: Nurse Practitioner

## 2014-10-07 VITALS — BP 120/70 | HR 84 | Ht 65.0 in | Wt 189.2 lb

## 2014-10-07 DIAGNOSIS — Z8371 Family history of colonic polyps: Secondary | ICD-10-CM

## 2014-10-07 DIAGNOSIS — R109 Unspecified abdominal pain: Secondary | ICD-10-CM | POA: Insufficient documentation

## 2014-10-07 DIAGNOSIS — R1033 Periumbilical pain: Secondary | ICD-10-CM

## 2014-10-07 DIAGNOSIS — K219 Gastro-esophageal reflux disease without esophagitis: Secondary | ICD-10-CM

## 2014-10-07 DIAGNOSIS — R194 Change in bowel habit: Secondary | ICD-10-CM

## 2014-10-07 DIAGNOSIS — E739 Lactose intolerance, unspecified: Secondary | ICD-10-CM

## 2014-10-07 DIAGNOSIS — Z83719 Family history of colon polyps, unspecified: Secondary | ICD-10-CM

## 2014-10-07 DIAGNOSIS — R197 Diarrhea, unspecified: Secondary | ICD-10-CM

## 2014-10-07 MED ORDER — MOVIPREP 100 G PO SOLR: 1.0000 | Freq: Once | ORAL | Status: DC

## 2014-10-07 NOTE — Patient Instructions (Signed)
You have been scheduled for an endoscopy and colonoscopy. Please follow the written instructions given to you at your visit today. Please pick up your prep at the pharmacy within the next 1-3 days. If you use inhalers (even only as needed), please bring them with you on the day of your procedure. Your physician has requested that you go to www.startemmi.com(SENT EMAIL) and enter the access code given to you at your visit today. This web site gives a general overview about your procedure. However, you should still follow specific instructions given to you by our office regarding your preparation for the procedure.  Your physician has requested that you go to the basement for the following lab work before leaving today: [place] on reflux is below for your review _____________________________________________________________________  Gastroesophageal Reflux Disease, Adult Gastroesophageal reflux disease (GERD) happens when acid from your stomach flows up into the esophagus. When acid comes in contact with the esophagus, the acid causes soreness (inflammation) in the esophagus. Over time, GERD may create small holes (ulcers) in the lining of the esophagus. CAUSES   Increased body weight. This puts pressure on the stomach, making acid rise from the stomach into the esophagus.  Smoking. This increases acid production in the stomach.  Drinking alcohol. This causes decreased pressure in the lower esophageal sphincter (valve or ring of muscle between the esophagus and stomach), allowing acid from the stomach into the esophagus.  Late evening meals and a full stomach. This increases pressure and acid production in the stomach.  A malformed lower esophageal sphincter. Sometimes, no cause is found. SYMPTOMS   Burning pain in the lower part of the mid-chest behind the breastbone and in the mid-stomach area. This may occur twice a week or more often.  Trouble swallowing.  Sore  throat.  Dry cough.  Asthma-like symptoms including chest tightness, shortness of breath, or wheezing. DIAGNOSIS  Your caregiver may be able to diagnose GERD based on your symptoms. In some cases, X-rays and other tests may be done to check for complications or to check the condition of your stomach and esophagus. TREATMENT  Your caregiver may recommend over-the-counter or prescription medicines to help decrease acid production. Ask your caregiver before starting or adding any new medicines.  HOME CARE INSTRUCTIONS   Change the factors that you can control. Ask your caregiver for guidance concerning weight loss, quitting smoking, and alcohol consumption.  Avoid foods and drinks that make your symptoms worse, such as:  Caffeine or alcoholic drinks.  Chocolate.  Peppermint or mint flavorings.  Garlic and onions.  Spicy foods.  Citrus fruits, such as oranges, lemons, or limes.  Tomato-based foods such as sauce, chili, salsa, and pizza.  Fried and fatty foods.  Avoid lying down for the 3 hours prior to your bedtime or prior to taking a nap.  Eat small, frequent meals instead of large meals.  Wear loose-fitting clothing. Do not wear anything tight around your waist that causes pressure on your stomach.  Raise the head of your bed 6 to 8 inches with wood blocks to help you sleep. Extra pillows will not help.  Only take over-the-counter or prescription medicines for pain, discomfort, or fever as directed by your caregiver.  Do not take aspirin, ibuprofen, or other nonsteroidal anti-inflammatory drugs (NSAIDs). SEEK IMMEDIATE MEDICAL CARE IF:   You have pain in your arms, neck, jaw, teeth, or back.  Your pain increases or changes in intensity or duration.  You develop nausea, vomiting, or  sweating (diaphoresis).  You develop shortness of breath, or you faint.  Your vomit is green, yellow, black, or looks like coffee grounds or blood.  Your stool is red, bloody, or  black. These symptoms could be signs of other problems, such as heart disease, gastric bleeding, or esophageal bleeding. MAKE SURE YOU:   Understand these instructions.  Will watch your condition.  Will get help right away if you are not doing well or get worse. Document Released: 08/30/2005 Document Revised: 02/12/2012 Document Reviewed: 06/09/2011 [facility] Patient Information 2015 [place], [place]. This information is not intended to replace advice given to you by your health care provider. Make sure you discuss any questions you have with your health care provider.

## 2014-10-07 NOTE — Progress Notes (Signed)
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 Patient is here for evaluation of bowel changes, possibly irritable bowel syndrome. She gives a long history of frequent loose stools influenced by diet. She has been lactose intolerance since age 89. Consuming large portions of food often causes loose stool. Lately patient has been having intermittent loose stool despite avoidance of dairy products and meal portion control. Stool is not really malodorous, it is yellow without blood. No nocturnal diarrhea. She does have normal bowel movements as well. In addition to bowel changes patient reports mid lower abdominal pain unrelieved with defecation. Patient has always had some generalized abdominal pain prior to defecation but this pain is different and does not resolve with bowel movements. PCP tried her on Levsin but it doesn't help. Patient had a colonoscopy by [facility] December 2006. The exam was done for evaluation of constipation. Findings included internal and external hemorrhoids, a few diverticula, exam was otherwise normal. Patient was advised to have follow-up colonoscopy in 5 years but did not do so.  Patient also gives a history of long-standing GERD. After gaining an excessive amount of weight over last year her GERD symptoms have worsened. She took Prilosec for years, PCP recently changed her to Protonix and GERD symptoms are improving. She describes heartburn but is mainly concerned about regurgitation of gastric contents, including food particles. She goes to bed on an empty stomach. She has developed a cough, felt to be secondary to reflux. Patient had an upper endoscopy at the time of colonoscopy in 2006. Findings included a small hiatal hernia, otherwise normal exam  Past Medical History  Diagnosis Date  . SVT (supraventricular tachycardia)   . Thyroid disease   . Chicken pox as a child  . Mumps as a child  . Hiatal hernia  2007  . Rectocele 06/12/2014  . 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 RLS (restless legs syndrome)   . Anemia     h/o low ferritin  . Anxiety   . Depression   . Asthma     environmental triggers  . GERD (gastroesophageal reflux disease)   . History of viral illness   . Lactose intolerance 06/12/2014  . Tachycardia 06/12/2014  . Adjustment reaction with anxiety and depression 06/12/2014  . Depression with anxiety 06/12/2014  . Gallstones   . Tick bite [id number]    erhliciosis    Family History  Problem Relation Age of Onset  . Hypertension Mother   . Hypertension Father   . Hypertension Sister   . Diabetes Sister     type 2  . Fibromyalgia Sister   . GI Bleed Maternal Grandmother   . Atrial fibrillation Maternal Grandmother   . Heart disease Maternal Grandfather   . Diabetes Paternal Grandmother   . Stroke Paternal Grandfather   . Proteinuria Sister    History  Substance Use Topics  . Smoking status: Never Smoker   . Smokeless tobacco: Never Used  . Alcohol Use: No   Current Outpatient Prescriptions  Medication Sig Dispense Refill  . albuterol (PROVENTIL HFA;VENTOLIN HFA) 108 (90 BASE) MCG/ACT inhaler Inhale 2 puffs into the lungs every 6 (six) hours as needed for wheezing.    Marland Kitchen aspirin (ASPIRIN EC) 81 MG EC tablet Take 81 mg by mouth at bedtime.     . beclomethasone (QVAR) 80 MCG/ACT inhaler Inhale 1 puff into the lungs as needed (shotness of  breath).     . clonazePAM (KLONOPIN) 1 MG tablet Take 1 tablet (1 mg total) by mouth at bedtime as needed for anxiety. 30 tablet 1  . diltiazem (CARDIZEM CD) 120 MG 24 hr capsule Take 120 mg by mouth daily.    Marland Kitchen diltiazem (CARDIZEM) 60 MG tablet Take 60 mg by mouth 2 (two) times daily.    . hyoscyamine (LEVSIN SL) 0.125 MG SL tablet Place 1 tablet (0.125 mg total) under the tongue every 4 (four) hours as needed. 30 tablet 1  . montelukast (SINGULAIR) 10 MG tablet Take 1 tablet (10 mg total) by mouth at  bedtime as needed. 30 tablet 3  . pantoprazole (PROTONIX) 40 MG tablet Take 1 tablet (40 mg total) by mouth daily. 30 tablet 3  . ranitidine (ZANTAC) 300 MG tablet Take 1 tablet (300 mg total) by mouth at bedtime as needed for heartburn. 30 tablet 3  . temazepam (RESTORIL) 30 MG capsule TAKE 1 CAPSULE BY MOUTH DAILY AT BEDTIME 30 capsule 0  . Vitamin D, Ergocalciferol, (DRISDOL) 50000 UNITS CAPS capsule Take 1 capsule (50,000 Units total) by mouth every 7 (seven) days. 12 capsule 0   No current facility-administered medications for this visit.   Allergies  Allergen Reactions  . Dilaudid [Hydromorphone Hcl]     Respiratory , cardiac    Review of Systems: Positive for cough, fatigue, heart rhythm changes, night sweats and urine leakage. All other systems reviewed and negative except where noted in HPI.   Physical Exam: BP 120/70 mmHg  Pulse 84  Ht 5\' 5"  (1.651 m)  Wt 189 lb 3.2 oz (85.821 kg)  BMI 31.48 kg/m2 Constitutional: Pleasant,well-developed, white female in no acute distress. HEENT: Normocephalic and atraumatic. Conjunctivae are normal. No scleral icterus. Neck supple.  Cardiovascular: Normal rate, regular rhythm.  Pulmonary/chest: Effort normal and breath sounds normal. No wheezing, rales or rhonchi. Abdominal: Soft, nondistended, nontender. Bowel sounds active throughout. There are no masses palpable. No hepatomegaly. Extremities: no edema Lymphadenopathy: No cervical adenopathy noted. Neurological: Alert and oriented to person place and time. Skin: Skin is warm and dry. No rashes noted. Psychiatric: Normal mood and affect. Behavior is normal.   ASSESSMENT AND PLAN: 33. 58 year old female with long-standing intermittent cramp diarrhea, especially with consumption of large meals. she was diagnosed with lactose intolerance at age 62. Patient has had increased frequency of loose stools despite meal portion control and avoidance of lactose. She has mid lower abdominal pain  unrelieved with defecation and that is a new problem  No recent antibiotics. Doubt infectious etiology but still need to exclude C. Difficile. Will check also check O&P (she has several dogs).  For further evaluation patient will also be scheduled for colonoscopy. The risks, benefits, and alternatives to colonoscopy with possible biopsy and possible polypectomy were discussed with the patient and she consents to proceed.   2. Long-standing GERD. She has been quite symptomatic since gaining excessive weight over the last year. In addition to pyrosis patient complains of regurgitation of food particles, especially when lying flat. Symptoms have improved after changing from Prilosec to Protonix last week, She also takes an H2 blocker at night. We discussed antireflux measures, written literature given. We discussed need for weight loss to help improve GERD. Patient is concerned she may have an enlarging hiatal hernia (small hernia in 2006). For further evaluation of GERD will proceed with upper endoscopy. The benefits, risks, and potential complications of EGD with possible biopsies were discussed with the patient and she  agrees to proceed.   3. Family history of adenomatous colon polyps in mother.

## 2014-10-12 NOTE — Progress Notes (Signed)
Reviewed and agree with management plan.  Tegan Britain T. Cassidee Deats, MD FACG 

## 2014-10-22 ENCOUNTER — Other Ambulatory Visit: Payer: Self-pay | Admitting: Family Medicine

## 2014-10-22 NOTE — Telephone Encounter (Signed)
Last rx 09-15-14 quantity 30 with 0 refills  rx printed for md to sign and fax

## 2014-11-12 ENCOUNTER — Ambulatory Visit (AMBULATORY_SURGERY_CENTER): Payer: 59 | Admitting: Gastroenterology

## 2014-11-12 ENCOUNTER — Encounter: Payer: 59 | Admitting: Gastroenterology

## 2014-11-12 ENCOUNTER — Encounter: Payer: Self-pay | Admitting: Gastroenterology

## 2014-11-12 VITALS — BP 137/57 | HR 77 | Temp 99.3°F | Resp 18 | Ht 65.0 in | Wt 189.0 lb

## 2014-11-12 DIAGNOSIS — K219 Gastro-esophageal reflux disease without esophagitis: Secondary | ICD-10-CM

## 2014-11-12 DIAGNOSIS — K2950 Unspecified chronic gastritis without bleeding: Secondary | ICD-10-CM

## 2014-11-12 DIAGNOSIS — D122 Benign neoplasm of ascending colon: Secondary | ICD-10-CM

## 2014-11-12 DIAGNOSIS — R197 Diarrhea, unspecified: Secondary | ICD-10-CM

## 2014-11-12 DIAGNOSIS — R1033 Periumbilical pain: Secondary | ICD-10-CM

## 2014-11-12 DIAGNOSIS — R194 Change in bowel habit: Secondary | ICD-10-CM

## 2014-11-12 DIAGNOSIS — Z8371 Family history of colonic polyps: Secondary | ICD-10-CM

## 2014-11-12 DIAGNOSIS — D131 Benign neoplasm of stomach: Secondary | ICD-10-CM

## 2014-11-12 MED ORDER — SODIUM CHLORIDE 0.9 % IV SOLN: 500.0000 mL | INTRAVENOUS | Status: DC

## 2014-11-12 NOTE — Patient Instructions (Signed)
YOU HAD AN ENDOSCOPIC PROCEDURE TODAY AT THE [facility]: Refer to the procedure report that was given to you for any specific questions about what was found during the examination.  If the procedure report does not answer your questions, please call your gastroenterologist to clarify.  If you requested that your care partner not be given the details of your procedure findings, then the procedure report has been included in a sealed envelope for you to review at your convenience later.  YOU SHOULD EXPECT: Some feelings of bloating in the abdomen. Passage of more gas than usual.  Walking can help get rid of the air that was put into your GI tract during the procedure and reduce the bloating. If you had a lower endoscopy (such as a colonoscopy or flexible sigmoidoscopy) you may notice spotting of blood in your stool or on the toilet paper. If you underwent a bowel prep for your procedure, then you may not have a normal bowel movement for a few days.  DIET: Your first meal following the procedure should be a light meal and then it is ok to progress to your normal diet.  A half-sandwich or bowl of soup is an example of a good first meal.  Heavy or fried foods are harder to digest and may make you feel nauseous or bloated.  Likewise meals heavy in dairy and vegetables can cause extra gas to form and this can also increase the bloating.  Drink plenty of fluids but you should avoid alcoholic beverages for 24 hours.  ACTIVITY: Your care partner should take you home directly after the procedure.  You should plan to take it easy, moving slowly for the rest of the day.  You can resume normal activity the day after the procedure however you should NOT DRIVE or use heavy machinery for 24 hours (because of the sedation medicines used during the test).    SYMPTOMS TO REPORT IMMEDIATELY: A gastroenterologist can be reached at any hour.  During normal business hours, 8:30 AM to 5:00 PM Monday through Friday,  call [phone].  After hours and on weekends, please call the GI answering service at [phone] who will take a message and have the physician on call contact you.   Following lower endoscopy (colonoscopy or flexible sigmoidoscopy):  Excessive amounts of blood in the stool  Significant tenderness or worsening of abdominal pains  Swelling of the abdomen that is new, acute  Fever of 100F or higher  Following upper endoscopy (EGD)  Vomiting of blood or coffee ground material  New chest pain or pain under the shoulder blades  Painful or persistently difficult swallowing  New shortness of breath  Fever of 100F or higher  Black, tarry-looking stools  FOLLOW UP: If any biopsies were taken you will be contacted by phone or by letter within the next 1-3 weeks.  Call your gastroenterologist if you have not heard about the biopsies in 3 weeks.  Our staff will call the home number listed on your records the next business day following your procedure to check on you and address any questions or concerns that you may have at that time regarding the information given to you following your procedure. This is a courtesy call and so if there is no answer at the home number and we have not heard from you through the emergency physician on call, we will assume that you have returned to your regular daily activities without incident.  SIGNATURES/CONFIDENTIALITY: You and/or your care   partner have signed paperwork which will be entered into your electronic medical record.  These signatures attest to the fact that that the information above on your After Visit Summary has been reviewed and is understood.  Full responsibility of the confidentiality of this discharge information lies with you and/or your care-partner.   Resume medications. Information given given on polyps, diverticulosis,hemorrhoids, high fiber diet, Gerd and FODMAP DIET with discharge instructions.

## 2014-11-12 NOTE — Op Note (Signed)
[place]  [address] [place], [id number]   COLONOSCOPY PROCEDURE REPORT  PATIENT: [name] Jensen, [name] Jensen  MR#: [id number] BIRTHDATE: 03-21-1956 , 58  yrs. old GENDER: female ENDOSCOPIST: Ladene Artist, MD, [facility] REFERRED SL:HTDSK, Stacy PROCEDURE DATE:  11/12/2014 PROCEDURE:   Colonoscopy with snare polypectomy First Screening Colonoscopy - Avg.  risk and is 50 yrs.  old or older - No.  Prior Negative Screening - Now for repeat screening. N/A  History of Adenoma - Now for follow-up colonoscopy & has been > or = to 3 yrs.  N/A  Polyps Removed Today? Yes. ASA CLASS:   Class III INDICATIONS:unexplained diarrhea, periumbilical abdominal pain, and patient's family history of colon polyps. MEDICATIONS: Monitored anesthesia care and Propofol 450 mg IV DESCRIPTION OF PROCEDURE:   After the risks benefits and alternatives of the procedure were thoroughly explained, informed consent was obtained.  The digital rectal exam revealed no abnormalities of the rectum.   The LB [id number] and LB [id number]  endoscope was introduced through the anus and advanced to the terminal ileum which was intubated for a short distance. No adverse events experienced.   The quality of the prep was good, using MoviPrep  The instrument was then slowly withdrawn as the colon was fully examined.  COLON FINDINGS: A sessile polyp measuring 6 mm in size was found in the ascending colon.  A polypectomy was performed with a cold snare.  The resection was complete, the polyp tissue was completely retrieved and sent to histology.   There was mild diverticulosis noted in the sigmoid colon. The sigmoid colon was tortuous, possible adhesions.   The examined terminal ileum appeared to be normal.   The examination was otherwise normal.  Retroflexed views revealed internal Grade I hemorrhoids. The time to cecum=2 minutes 55 seconds.  Withdrawal time=11 minutes 40 seconds.  The scope  was withdrawn and the procedure completed. COMPLICATIONS: There were no immediate complications.  ENDOSCOPIC IMPRESSION: 1.   Sessile polyp in the ascending colon; polypectomy performed with a cold snare 2.   Mild diverticulosis in the sigmoid colon 3.   The examined terminal ileum appeared to be normal 4.   Grade l internal hemorrhoids  RECOMMENDATIONS: 1.  High fiber diet with liberal fluid intake. 2.  Repeat Colonoscopy in 5 years. 3.  Levsin 1-2 ac regularly and then q4h prn 4.  FODMAP diet 5.  Office appt in 1 month  eSigned:  Ladene Artist, MD, [facility] 2014-11-12 72:62:03.559

## 2014-11-12 NOTE — Op Note (Addendum)
[place]  [address] [place], [id number]   ENDOSCOPY PROCEDURE REPORT  PATIENT: Mystery, Schrupp  MR#: [id number] BIRTHDATE: September 07, 1956 , 58  yrs. old GENDER: female ENDOSCOPIST: Ladene Artist, MD, [facility] REFERRED BY:  Willette Alma PROCEDURE DATE:  11/12/2014 PROCEDURE:  EGD w/ biopsy and EGD w/ snare polypectomy ASA CLASS:     Class III INDICATIONS:  history of esophageal reflux, periumbilical abdominal pain and diarrhea. MEDICATIONS: Monitored anesthesia care, Residual sedation present, and Propofol 200 mg IV TOPICAL ANESTHETIC: none DESCRIPTION OF PROCEDURE: After the risks benefits and alternatives of the procedure were thoroughly explained, informed consent was obtained.  The LB [id number] endoscope was introduced through the mouth and advanced to the second portion of the duodenum , Without limitations.  The instrument was slowly withdrawn as the mucosa was fully examined.    STOMACH: A sessile polyp measuring 5 mm in size was found in the gastric antrum.  A polypectomy was performed with a cold snare. The resection was complete and the polyp tissue was completely retrieved.   The stomach otherwise appeared normal. DUODENUM: The duodenal mucosa showed no abnormalities in the bulb and 2nd part of the duodenum.  Cold forceps biopsies were taken in the bulb and second portion. ESOPHAGUS: The mucosa of the esophagus appeared normal.  Retroflexed views revealed a small hiatal hernia.  The scope was then withdrawn from the patient and the procedure completed.  COMPLICATIONS: There were no immediate complications.  ENDOSCOPIC IMPRESSION: 1.   Sessile polyp in the gastric antrum; polypectomy performed 2.   Small hiatal hernia 3.    Gastric antrum polyp; polypectomy by cold snare  RECOMMENDATIONS: 1.  Anti-reflux regimen long term 2.  Continue PPI and H2RA hs 3.  Await pathology results [  eSigned:  Ladene Artist, MD, [facility] 2014-11-12  81:82:[id number] Revised: 2014-11-12 15:52:00.268

## 2014-11-12 NOTE — Progress Notes (Signed)
Called to room to assist during endoscopic procedure.  Patient ID and intended procedure confirmed with present staff. Received instructions for my participation in the procedure from the performing physician.  

## 2014-11-12 NOTE — Progress Notes (Signed)
Patient awakening,vss,report to rn 

## 2014-11-13 ENCOUNTER — Telehealth: Payer: Self-pay | Admitting: *Deleted

## 2014-11-13 NOTE — Telephone Encounter (Signed)
No answer, message left for the patient on voicemail.

## 2014-11-18 ENCOUNTER — Encounter: Payer: Self-pay | Admitting: Gastroenterology

## 2014-11-19 ENCOUNTER — Encounter: Payer: 59 | Admitting: Internal Medicine

## 2014-11-30 ENCOUNTER — Other Ambulatory Visit: Payer: Self-pay | Admitting: Family Medicine

## 2014-12-01 ENCOUNTER — Ambulatory Visit (HOSPITAL_COMMUNITY)
Admission: RE | Admit: 2014-12-01 | Discharge: 2014-12-01 | Disposition: A | Discharge status: Home / Self Care | Payer: 59 | Source: Ambulatory Visit | Attending: Internal Medicine | Admitting: Internal Medicine

## 2014-12-01 ENCOUNTER — Telehealth: Payer: Self-pay | Admitting: *Deleted

## 2014-12-01 DIAGNOSIS — R000 Tachycardia, unspecified: Secondary | ICD-10-CM | POA: Diagnosis not present

## 2014-12-01 NOTE — Telephone Encounter (Signed)
Requesting Klonopin 1mg -Take 1 tablet by mouth at bedtime as needed for anxiety. Last refill:06/12/14;#30,1 Last OV:09/28/14 UDS;Not one on file Please advise.//AB/CMA

## 2014-12-01 NOTE — Progress Notes (Signed)
48 hour holter monitor in progress.

## 2014-12-01 NOTE — Telephone Encounter (Signed)
Per Dr. Harrington Challenger. Ordered 48 hr holter monitor for tachycardia. Orders in

## 2014-12-03 NOTE — Telephone Encounter (Signed)
Rx printed and signed and faxed to the pharmacy.//AB/CMA

## 2014-12-14 NOTE — Telephone Encounter (Signed)
Pt seen

## 2014-12-22 ENCOUNTER — Encounter (HOSPITAL_BASED_OUTPATIENT_CLINIC_OR_DEPARTMENT_OTHER): Payer: 59

## 2015-01-06 ENCOUNTER — Ambulatory Visit: Payer: 59 | Admitting: Gastroenterology

## 2015-01-13 ENCOUNTER — Ambulatory Visit (HOSPITAL_BASED_OUTPATIENT_CLINIC_OR_DEPARTMENT_OTHER): Payer: 59 | Attending: Pulmonary Disease | Admitting: Radiology

## 2015-01-13 VITALS — Ht 65.0 in | Wt 189.0 lb

## 2015-01-13 DIAGNOSIS — G4733 Obstructive sleep apnea (adult) (pediatric): Secondary | ICD-10-CM | POA: Insufficient documentation

## 2015-01-27 DIAGNOSIS — G4733 Obstructive sleep apnea (adult) (pediatric): Secondary | ICD-10-CM

## 2015-01-27 NOTE — Progress Notes (Signed)
Called pt and appt scheduled for 02/03/15

## 2015-01-27 NOTE — Progress Notes (Signed)
Pt needs ov to review sleep study results.  

## 2015-01-27 NOTE — Sleep Study (Signed)
   NAME: [name] Jensen DATE OF BIRTH:  05-15-1956 MEDICAL RECORD NUMBER [id number]  LOCATION: [facility]  PHYSICIAN: [place] OF STUDY: 01/13/2015  SLEEP STUDY TYPE: Nocturnal Polysomnogram               REFERRING PHYSICIAN: Clance, Armando Reichert, MD  INDICATION FOR STUDY: Hypersomnia with sleep apnea  EPWORTH SLEEPINESS SCORE:  12 HEIGHT: 5\' 5"  (165.1 cm)  WEIGHT: 189 lb (85.73 kg)    Body mass index is 31.45 kg/(m^2).  NECK SIZE: 16 in.  MEDICATIONS: Reviewed in the sleep record  SLEEP ARCHITECTURE: The patient had a total sleep time of 326 minutes with no slow-wave sleep and only 1.5 minutes of REM. Sleep onset latency was mildly prolonged at 38 minutes, and REM onset was greatly prolonged at 262 minutes. Sleep efficiency was mildly reduced at 85%.  RESPIRATORY DATA: The patient was found to have 5 apneas and 36 obstructive hypopneas, giving her an AHI of 7.5 events per hour. The events occurred in all body positions, and there was moderate snoring noted throughout.  OXYGEN DATA: There was transient oxygen desaturation as low as 84% with the patient's obstructive events  CARDIAC DATA: No clinically significant arrhythmias were noted  MOVEMENT/PARASOMNIA: The patient had 224 periodic limb movements, but only 1.3 per hour resulted in arousal or awakening. There were no abnormal behaviors seen.  IMPRESSION/ RECOMMENDATION:    1) mild obstructive sleep apnea/hypopnea syndrome, with an AHI of 8 events per hour and transient oxygen desaturation as low as 84%. Repeat minutes for this degree of sleep apnea can include a trial of weight loss alone, upper airway surgery, dental appliance, and finally CPAP. Clinical correlation is suggested.  2) large numbers of periodic limb movements, but very little sleep disruption during the night. Again, clinical correlation is suggested.   [place], American Board of Sleep  Medicine  ELECTRONICALLY SIGNED ON:  01/27/2015, 9:55 AM [place] PH: [phone]   FX: [phone] [place]

## 2015-02-03 ENCOUNTER — Ambulatory Visit: Payer: 59 | Admitting: Gastroenterology

## 2015-02-03 ENCOUNTER — Ambulatory Visit: Payer: 59 | Admitting: Pulmonary Disease

## 2015-02-17 ENCOUNTER — Telehealth: Payer: Self-pay | Admitting: Gastroenterology

## 2015-02-17 NOTE — Telephone Encounter (Signed)
-----   Message from [place] sent at 01/06/2015 10:42 AM EST ----- No bill this time but next time we will bill per Dr. Fuller Plan. ----- Message -----    From: Dow Adolph    Sent: 01/06/2015   8:20 AM      To: Marzella Schlein, CMA  Pt canceled because she just fell and hurt her foot.  Will CB to resch'd

## 2015-02-19 ENCOUNTER — Other Ambulatory Visit: Payer: Self-pay | Admitting: Family Medicine

## 2015-02-19 NOTE — Telephone Encounter (Signed)
Faxed hardcopy to [place] for clonazepam to [facility].  Did scheduled patient for 04/06/15 at 5:45.

## 2015-02-19 NOTE — Telephone Encounter (Signed)
I will let her have a one month supply but she has not been seen since October but will need appt for more

## 2015-04-06 ENCOUNTER — Encounter: Payer: Self-pay | Admitting: Family Medicine

## 2015-04-06 ENCOUNTER — Ambulatory Visit (INDEPENDENT_AMBULATORY_CARE_PROVIDER_SITE_OTHER): Payer: 59 | Admitting: Family Medicine

## 2015-04-06 VITALS — BP 132/70 | HR 83 | Temp 98.2°F | Ht 65.0 in | Wt 190.0 lb

## 2015-04-06 DIAGNOSIS — G4733 Obstructive sleep apnea (adult) (pediatric): Secondary | ICD-10-CM | POA: Diagnosis not present

## 2015-04-06 DIAGNOSIS — R000 Tachycardia, unspecified: Secondary | ICD-10-CM

## 2015-04-06 DIAGNOSIS — E782 Mixed hyperlipidemia: Secondary | ICD-10-CM | POA: Diagnosis not present

## 2015-04-06 DIAGNOSIS — K219 Gastro-esophageal reflux disease without esophagitis: Secondary | ICD-10-CM

## 2015-04-06 DIAGNOSIS — R739 Hyperglycemia, unspecified: Secondary | ICD-10-CM

## 2015-04-06 DIAGNOSIS — E063 Autoimmune thyroiditis: Secondary | ICD-10-CM

## 2015-04-06 MED ORDER — VENLAFAXINE HCL ER 75 MG PO CP24: 75.0000 mg | ORAL_CAPSULE | Freq: Every day | ORAL | Status: DC

## 2015-04-06 MED ORDER — VENLAFAXINE HCL ER 37.5 MG PO CP24: 37.5000 mg | ORAL_CAPSULE | Freq: Every day | ORAL | Status: DC

## 2015-04-06 MED ORDER — TEMAZEPAM 30 MG PO CAPS: ORAL_CAPSULE | ORAL | Status: DC

## 2015-04-06 MED ORDER — METHYLPREDNISOLONE 4 MG PO TBPK
ORAL_TABLET | ORAL | Status: DC
Start: 2015-04-06 — End: 2015-04-14

## 2015-04-06 MED ORDER — ARIPIPRAZOLE 2 MG PO TABS: 2.0000 mg | ORAL_TABLET | Freq: Every day | ORAL | Status: DC

## 2015-04-06 NOTE — Patient Instructions (Signed)
Salon Pas patches, or gel   Back Pain, Adult Low back pain is very common. About 1 in 5 people have back pain.The cause of low back pain is rarely dangerous. The pain often gets better over time.About half of people with a sudden onset of back pain feel better in just 2 weeks. About 8 in 10 people feel better by 6 weeks.  CAUSES Some common causes of back pain include:  Strain of the muscles or ligaments supporting the spine.  Wear and tear (degeneration) of the spinal discs.  Arthritis.  Direct injury to the back. DIAGNOSIS Most of the time, the direct cause of low back pain is not known.However, back pain can be treated effectively even when the exact cause of the pain is unknown.Answering your caregiver's questions about your overall health and symptoms is one of the most accurate ways to make sure the cause of your pain is not dangerous. If your caregiver needs more information, he or she may order lab work or imaging tests (X-rays or MRIs).However, even if imaging tests show changes in your back, this usually does not require surgery. HOME CARE INSTRUCTIONS For many people, back pain returns.Since low back pain is rarely dangerous, it is often a condition that people can learn to [facility] their own.   Remain active. It is stressful on the back to sit or stand in one place. Do not sit, drive, or stand in one place for more than 30 minutes at a time. Take short walks on level surfaces as soon as pain allows.Try to increase the length of time you walk each day.  Do not stay in bed.Resting more than 1 or 2 days can delay your recovery.  Do not avoid exercise or work.Your body is made to move.It is not dangerous to be active, even though your back may hurt.Your back will likely heal faster if you return to being active before your pain is gone.  Pay attention to your body when you bend and lift. Many people have less discomfortwhen lifting if they bend their knees, keep the  load close to their bodies,and avoid twisting. Often, the most comfortable positions are those that put less stress on your recovering back.  Find a comfortable position to sleep. Use a firm mattress and lie on your side with your knees slightly bent. If you lie on your back, put a pillow under your knees.  Only take over-the-counter or prescription medicines as directed by your caregiver. Over-the-counter medicines to reduce pain and inflammation are often the most helpful.Your caregiver may prescribe muscle relaxant drugs.These medicines help dull your pain so you can more quickly return to your normal activities and healthy exercise.  Put ice on the injured area.  Put ice in a plastic bag.  Place a towel between your skin and the bag.  Leave the ice on for 15-20 minutes, 03-04 times a day for the first 2 to 3 days. After that, ice and heat may be alternated to reduce pain and spasms.  Ask your caregiver about trying back exercises and gentle massage. This may be of some benefit.  Avoid feeling anxious or stressed.Stress increases muscle tension and can worsen back pain.It is important to recognize when you are anxious or stressed and learn ways to manage it.Exercise is a great option. SEEK MEDICAL CARE IF:  You have pain that is not relieved with rest or medicine.  You have pain that does not improve in 1 week.  You have new symptoms.  You are generally not feeling well. SEEK IMMEDIATE MEDICAL CARE IF:   You have pain that radiates from your back into your legs.  You develop new bowel or bladder control problems.  You have unusual weakness or numbness in your arms or legs.  You develop nausea or vomiting.  You develop abdominal pain.  You feel faint. Document Released: 11/20/2005 Document Revised: 05/21/2012 Document Reviewed: 03/24/2014 [facility] Patient Information 2015 [place], [place]. This information is not intended to replace advice given to you by your health  care provider. Make sure you discuss any questions you have with your health care provider.

## 2015-04-06 NOTE — Progress Notes (Signed)
Pre visit review using our clinic review tool, if applicable. No additional management support is needed unless otherwise documented below in the visit note. 

## 2015-04-09 ENCOUNTER — Telehealth: Payer: Self-pay | Admitting: Family Medicine

## 2015-04-09 NOTE — Telephone Encounter (Signed)
The patient has had one day of steroids and 1 abilify.  Her back pain is much better, But this morning BP is 150/92, heart rate 121 and experiencing SOB, she just feels bad.  She states she does see cardiology for tachycardia.  She suspects its the steroid causing this.  Advise please

## 2015-04-09 NOTE — Telephone Encounter (Signed)
Called the patient back informed of PCP's verbal instructions.  To take no steroids today 04/09/15, no caffeine and if SOB/chest pain needs to be seen and HR needs to come down.  Informed per PCP instructions to try in the morning 1 steroid pill and 1 in the evening.  Patient did verbalize an understanding of all instruction.

## 2015-04-11 ENCOUNTER — Encounter: Payer: Self-pay | Admitting: Family Medicine

## 2015-04-11 DIAGNOSIS — R739 Hyperglycemia, unspecified: Secondary | ICD-10-CM | POA: Insufficient documentation

## 2015-04-11 DIAGNOSIS — E782 Mixed hyperlipidemia: Secondary | ICD-10-CM

## 2015-04-11 HISTORY — DX: Mixed hyperlipidemia: E78.2

## 2015-04-11 NOTE — Assessment & Plan Note (Signed)
Rate controlled 

## 2015-04-11 NOTE — Assessment & Plan Note (Signed)
Avoid offending foods, start probiotics. Do not eat large meals in late evening and consider raising head of bed.  

## 2015-04-11 NOTE — Assessment & Plan Note (Signed)
hgba1c acceptable, minimize simple carbs. Increase exercise as tolerated. Continue current meds 

## 2015-04-11 NOTE — Assessment & Plan Note (Signed)
Encouraged heart healthy diet, increase exercise, avoid trans fats, consider a krill oil cap daily 

## 2015-04-11 NOTE — Assessment & Plan Note (Signed)
Using CPAP daily 

## 2015-04-11 NOTE — Progress Notes (Signed)
[name] Jensen  [id number] 1956/05/30 04/11/2015      Progress Note-Follow Up  Subjective  Chief Complaint  Chief Complaint  Patient presents with  . Follow-up    HPI  Patient is a 59 y.o. female in today for routine medical care. Patient is in today for follow-up. Is struggling with increased congestion and itchy watery eyes from allergies. Also notes she fell off a horse a couple of days ago and has had some low back pain ever since. No recent illness. Continues to struggle with restless leg syndrome at night but benzodiazepines seem to be helpful. Denies CP/palp/SOB/HA/congestion/fevers/GI or GU c/o. Taking meds as prescribed  Past Medical History  Diagnosis Date  . SVT (supraventricular tachycardia)   . Thyroid disease   . Chicken pox as a child  . Mumps as a child  . Hiatal hernia 2007  . Rectocele 06/12/2014  . 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 RLS (restless legs syndrome)   . Anemia     h/o low ferritin  . Anxiety   . Depression   . Asthma     environmental triggers  . GERD (gastroesophageal reflux disease)   . History of viral illness   . Lactose intolerance 06/12/2014  . Tachycardia 06/12/2014  . Adjustment reaction with anxiety and depression 06/12/2014  . Depression with anxiety 06/12/2014  . Gallstones   . Ehrlichiosis 1761  . Diverticulosis   . Sessile colonic polyp   . Hyperlipidemia, mixed 04/11/2015    Past Surgical History  Procedure Laterality Date  . Cholecystectomy    . Appendectomy    . Tonsillectomy  1972    2ith adenoid  . Wisdom tooth extraction  59 yrs old  . Abdominal hysterectomy  11-2008    total with cystocele, rectocele repair  . Skin surgery      right arm scc  . Colonoscopy  2006    diverticulosis, hemorrhoids ([place])  . Esophagogastroduodenoscopy  2006    small hiatus hernia ([place])  . Svt ablation    . Incontinence surgery      Family History  Problem Relation Age of Onset  .  Hypertension Mother   . Hypertension Father   . Hypertension Sister   . Diabetes Sister     type 2  . Fibromyalgia Sister   . GI Bleed Maternal Grandmother   . Atrial fibrillation Maternal Grandmother   . Heart disease Maternal Grandfather   . Diabetes Paternal Grandmother   . Stroke Paternal Grandfather   . Proteinuria Sister     History   Social History  . Marital Status: Divorced    Spouse Name: N/A  . Number of Children: 2  . Years of Education: N/A   Occupational History  . RN [employer]   Social History Main Topics  . Smoking status: Never Smoker   . Smokeless tobacco: Never Used  . Alcohol Use: No  . Drug Use: No  . Sexual Activity: Not on file     Comment: lives by self with 4 dogs works at [employer], avoids dairy   Other Topics Concern  . Not on file   Social History Narrative    Current Outpatient Prescriptions on File Prior to Visit  Medication Sig Dispense Refill  . albuterol (PROVENTIL HFA;VENTOLIN HFA) 108 (90 BASE) MCG/ACT inhaler Inhale 2 puffs into the lungs every 6 (six) hours as needed for wheezing.    Marland Kitchen aspirin (ASPIRIN EC) 81 MG  EC tablet Take 81 mg by mouth at bedtime.     . beclomethasone (QVAR) 80 MCG/ACT inhaler Inhale 1 puff into the lungs as needed (shotness of breath).     . clonazePAM (KLONOPIN) 1 MG tablet TAKE 1 TABLET BY MOUTH AT BEDTIME AS NEEDED FOR ANXIETY 30 tablet 0  . diltiazem (CARDIZEM CD) 120 MG 24 hr capsule Take 120 mg by mouth daily.    Marland Kitchen diltiazem (CARDIZEM) 60 MG tablet Take 60 mg by mouth 2 (two) times daily.    . pantoprazole (PROTONIX) 40 MG tablet TAKE 1 TABLET (40 MG TOTAL) BY MOUTH DAILY. 30 tablet 3  . ranitidine (ZANTAC) 300 MG tablet TAKE 1 TABLET (300 MG TOTAL) BY MOUTH AT BEDTIME AS NEEDED FOR HEARTBURN. 30 tablet 3   No current facility-administered medications on file prior to visit.    Allergies  Allergen Reactions  . Dilaudid [Hydromorphone Hcl]     Respiratory , cardiac     Review of  Systems  Review of Systems  Constitutional: Negative for fever and malaise/fatigue.  HENT: Negative for congestion.   Eyes: Negative for discharge.  Respiratory: Negative for shortness of breath.   Cardiovascular: Negative for chest pain, palpitations and leg swelling.  Gastrointestinal: Negative for nausea, abdominal pain and diarrhea.  Genitourinary: Negative for dysuria.  Musculoskeletal: Positive for back pain. Negative for falls.  Skin: Negative for rash.  Neurological: Negative for loss of consciousness and headaches.  Endo/Heme/Allergies: Negative for polydipsia.  Psychiatric/Behavioral: Negative for suicidal ideas. The patient is nervous/anxious. The patient does not have insomnia.     Objective  BP 132/70 mmHg  Pulse 83  Temp(Src) 98.2 F (36.8 C) (Oral)  Ht 5\' 5"  (1.651 m)  Wt 190 lb (86.183 kg)  BMI 31.62 kg/m2  SpO2 96%  Physical Exam  Physical Exam  Constitutional: She is oriented to person, place, and time and well-developed, well-nourished, and in no distress. No distress.  HENT:  Head: Normocephalic and atraumatic.  Eyes: Conjunctivae are normal.  Neck: Neck supple. No thyromegaly present.  Cardiovascular: Normal rate, regular rhythm and normal heart sounds.   No murmur heard. Pulmonary/Chest: Effort normal and breath sounds normal. She has no wheezes.  Abdominal: She exhibits no distension and no mass.  Musculoskeletal: She exhibits no edema.  Lymphadenopathy:    She has no cervical adenopathy.  Neurological: She is alert and oriented to person, place, and time.  Skin: Skin is warm and dry. No rash noted. She is not diaphoretic.  Psychiatric: Memory, affect and judgment normal.    Lab Results  Component Value Date   TSH 1.09 06/17/2014   Lab Results  Component Value Date   WBC 8.8 09/28/2014   HGB 13.6 09/28/2014   HCT 41.3 09/28/2014   MCV 84.1 09/28/2014   PLT 343.0 09/28/2014   Lab Results  Component Value Date   CREATININE 0.7  09/28/2014   BUN 11 09/28/2014   NA 141 09/28/2014   K 3.8 09/28/2014   CL 106 09/28/2014   CO2 18* 09/28/2014   Lab Results  Component Value Date   ALT 24 09/28/2014   AST 22 09/28/2014   ALKPHOS 109 09/28/2014   BILITOT 0.3 09/28/2014   Lab Results  Component Value Date   CHOL 216* 06/17/2014   Lab Results  Component Value Date   HDL 54.30 06/17/2014   Lab Results  Component Value Date   LDLCALC 124* 06/17/2014   Lab Results  Component Value Date   TRIG 187.0*  06/17/2014   Lab Results  Component Value Date   CHOLHDL 4 06/17/2014     Assessment & Plan  OSA (obstructive sleep apnea) Using CPAP daily   GERD (gastroesophageal reflux disease) Avoid offending foods, start probiotics. Do not eat large meals in late evening and consider raising head of bed.    Tachycardia Rate controlled    Hyperglycemia hgba1c acceptable, minimize simple carbs. Increase exercise as tolerated. Continue current meds   Hyperlipidemia, mixed Encouraged heart healthy diet, increase exercise, avoid trans fats, consider a krill oil cap daily

## 2015-04-14 ENCOUNTER — Encounter: Payer: Self-pay | Admitting: Physician Assistant

## 2015-04-14 ENCOUNTER — Ambulatory Visit (INDEPENDENT_AMBULATORY_CARE_PROVIDER_SITE_OTHER): Payer: 59 | Admitting: Physician Assistant

## 2015-04-14 VITALS — BP 140/80 | HR 108 | Temp 98.0°F

## 2015-04-14 DIAGNOSIS — M546 Pain in thoracic spine: Secondary | ICD-10-CM | POA: Diagnosis not present

## 2015-04-14 DIAGNOSIS — F418 Other specified anxiety disorders: Secondary | ICD-10-CM

## 2015-04-14 MED ORDER — ESCITALOPRAM OXALATE 20 MG PO TABS: ORAL_TABLET | ORAL | Status: DC

## 2015-04-14 MED ORDER — CELECOXIB 200 MG PO CAPS: 200.0000 mg | ORAL_CAPSULE | Freq: Two times a day (BID) | ORAL | Status: DC

## 2015-04-14 NOTE — Assessment & Plan Note (Addendum)
Discontinue Effexor. We'll begin Lexapro 10 mg daily for one week, before titrating up to 20 mg daily. Follow-up with PCP in 3-4 weeks. Potential side effects of SSRI medications discussed with patient.

## 2015-04-14 NOTE — Patient Instructions (Signed)
Please stop the Effexor.  Start the Lexapro, taking as directed. Stay well hydrated and get rest. Take the Celebrex as directed with food. Alternate ice and heat to area. Follow-up with Dr. Charlett Blake in 3-4 weeks.  Return sooner if needed.

## 2015-04-14 NOTE — Assessment & Plan Note (Signed)
Patient unable to tolerate Medrol Dosepak due to side effects. We'll stop this medication. Rx Celebrex twice daily. Avoid heavy lifting or over exertion. Topical Aspercreme or icy hot. Follow-up if symptoms are not improving.

## 2015-04-14 NOTE — Progress Notes (Signed)
Patient presents to clinic today c/o continued stabbing/burning back pain with muscle spasms. Was given Medrol pack at last visit but cannot tolerate due to palpitations. Denies trauma or injury.  Denies numbness, tingling or weakness.  Patient also having body spasms since starting the Effexor for depressed mood.   Past Medical History  Diagnosis Date  . SVT (supraventricular tachycardia)   . Thyroid disease   . Chicken pox as a child  . Mumps as a child  . Hiatal hernia 2007  . Rectocele 06/12/2014  . 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 RLS (restless legs syndrome)   . Anemia     h/o low ferritin  . Anxiety   . Depression   . Asthma     environmental triggers  . GERD (gastroesophageal reflux disease)   . History of viral illness   . Lactose intolerance 06/12/2014  . Tachycardia 06/12/2014  . Adjustment reaction with anxiety and depression 06/12/2014  . Depression with anxiety 06/12/2014  . Gallstones   . Ehrlichiosis 9211  . Diverticulosis   . Sessile colonic polyp   . Hyperlipidemia, mixed 04/11/2015    Current Outpatient Prescriptions on File Prior to Visit  Medication Sig Dispense Refill  . albuterol (PROVENTIL HFA;VENTOLIN HFA) 108 (90 BASE) MCG/ACT inhaler Inhale 2 puffs into the lungs every 6 (six) hours as needed for wheezing.    . ARIPiprazole (ABILIFY) 2 MG tablet Take 1 tablet (2 mg total) by mouth daily. 30 tablet 2  . aspirin (ASPIRIN EC) 81 MG EC tablet Take 81 mg by mouth at bedtime.     . beclomethasone (QVAR) 80 MCG/ACT inhaler Inhale 1 puff into the lungs as needed (shotness of breath).     . clonazePAM (KLONOPIN) 1 MG tablet TAKE 1 TABLET BY MOUTH AT BEDTIME AS NEEDED FOR ANXIETY 30 tablet 0  . diltiazem (CARDIZEM CD) 120 MG 24 hr capsule Take 120 mg by mouth daily.    Marland Kitchen diltiazem (CARDIZEM) 60 MG tablet Take 60 mg by mouth 2 (two) times daily.    . pantoprazole (PROTONIX) 40 MG tablet TAKE 1 TABLET (40 MG TOTAL) BY MOUTH  DAILY. 30 tablet 3  . ranitidine (ZANTAC) 300 MG tablet TAKE 1 TABLET (300 MG TOTAL) BY MOUTH AT BEDTIME AS NEEDED FOR HEARTBURN. 30 tablet 3  . temazepam (RESTORIL) 30 MG capsule TAKE 1 CAPSULE BY MOUTH DAILY AT BEDTIME 30 capsule 2   No current facility-administered medications on file prior to visit.    Allergies  Allergen Reactions  . Dilaudid [Hydromorphone Hcl]     Respiratory , cardiac     Family History  Problem Relation Age of Onset  . Hypertension Mother   . Hypertension Father   . Hypertension Sister   . Diabetes Sister     type 2  . Fibromyalgia Sister   . GI Bleed Maternal Grandmother   . Atrial fibrillation Maternal Grandmother   . Heart disease Maternal Grandfather   . Diabetes Paternal Grandmother   . Stroke Paternal Grandfather   . Proteinuria Sister     History   Social History  . Marital Status: Divorced    Spouse Name: N/A  . Number of Children: 2  . Years of Education: N/A   Occupational History  . RN [employer]   Social History Main Topics  . Smoking status: Never Smoker   . Smokeless tobacco: Never Used  . Alcohol Use: No  . Drug Use:  No  . Sexual Activity: Not on file     Comment: lives by self with 4 dogs works at [employer], avoids dairy   Other Topics Concern  . None   Social History Narrative   Review of Systems - See HPI.  All other ROS are negative.  BP 140/80 mmHg  Pulse 108  Temp(Src) 98 F (36.7 C)  Wt   SpO2 99%  Physical Exam  Constitutional: She is oriented to person, place, and time and well-developed, well-nourished, and in no distress.  HENT:  Head: Normocephalic and atraumatic.  Eyes: Conjunctivae are normal.  Cardiovascular: Normal rate, regular rhythm, normal heart sounds and intact distal pulses.   Pulmonary/Chest: Effort normal and breath sounds normal. No respiratory distress. She has no wheezes. She has no rales. She exhibits no tenderness.  Musculoskeletal: She exhibits tenderness.  Neurological: She  is alert and oriented to person, place, and time.  Skin: Skin is warm and dry. No rash noted.  Psychiatric: Affect normal.  Vitals reviewed.  No results found for this or any previous visit (from the past 2160 hour(s)).  Assessment/Plan: Depression with anxiety Discontinue Effexor. We'll begin Lexapro 10 mg daily for one week, before titrating up to 20 mg daily. Follow-up with PCP in 3-4 weeks. Potential side effects of SSRI medications discussed with patient.   Left-sided thoracic back pain Patient unable to tolerate Medrol Dosepak due to side effects. We'll stop this medication. Rx Celebrex twice daily. Avoid heavy lifting or over exertion. Topical Aspercreme or icy hot. Follow-up if symptoms are not improving.

## 2015-04-14 NOTE — Progress Notes (Signed)
Pre visit review using our clinic review tool, if applicable. No additional management support is needed unless otherwise documented below in the visit note. 

## 2015-04-15 ENCOUNTER — Telehealth: Payer: Self-pay | Admitting: Family Medicine

## 2015-04-15 ENCOUNTER — Other Ambulatory Visit (INDEPENDENT_AMBULATORY_CARE_PROVIDER_SITE_OTHER): Payer: 59

## 2015-04-15 DIAGNOSIS — R739 Hyperglycemia, unspecified: Secondary | ICD-10-CM | POA: Diagnosis not present

## 2015-04-15 DIAGNOSIS — I471 Supraventricular tachycardia: Secondary | ICD-10-CM | POA: Diagnosis not present

## 2015-04-15 DIAGNOSIS — E785 Hyperlipidemia, unspecified: Secondary | ICD-10-CM | POA: Diagnosis not present

## 2015-04-15 LAB — COMPREHENSIVE METABOLIC PANEL
ALT: 23 U/L (ref 0–35)
AST: 19 U/L (ref 0–37)
Albumin: 4.4 g/dL (ref 3.5–5.2)
Alkaline Phosphatase: 125 U/L — ABNORMAL HIGH (ref 39–117)
BUN: 14 mg/dL (ref 6–23)
CO2: 29 meq/L (ref 19–32)
CREATININE: 0.77 mg/dL (ref 0.40–1.20)
Calcium: 10 mg/dL (ref 8.4–10.5)
Chloride: 104 mEq/L (ref 96–112)
GFR: 82.21 mL/min (ref >60.00)
GLUCOSE: 115 mg/dL — AB (ref 70–99)
Potassium: 4.2 mEq/L (ref 3.5–5.1)
Sodium: 140 mEq/L (ref 135–145)
TOTAL PROTEIN: 7.5 g/dL (ref 6.0–8.3)
Total Bilirubin: 0.6 mg/dL (ref 0.2–1.2)

## 2015-04-15 LAB — LIPID PANEL
Cholesterol: 206 mg/dL — ABNORMAL HIGH (ref 0–200)
HDL: 72.4 mg/dL (ref >39.00)
LDL Cholesterol: 100 mg/dL — ABNORMAL HIGH (ref 0–99)
NONHDL: 133.6
TRIGLYCERIDES: 168 mg/dL — AB (ref 0.0–149.0)
Total CHOL/HDL Ratio: 3
VLDL: 33.6 mg/dL (ref 0.0–40.0)

## 2015-04-15 LAB — HEMOGLOBIN A1C: Hgb A1c MFr Bld: 6.2 % (ref 4.6–6.5)

## 2015-04-15 LAB — TSH: TSH: 1.19 u[IU]/mL (ref 0.35–4.50)

## 2015-04-15 NOTE — Telephone Encounter (Signed)
Patient was in the lab yesterday 04/14/15 and had blood drawn, but no order.  Please let me/Angie know what to order.thanks

## 2015-04-15 NOTE — Telephone Encounter (Signed)
Labs entered by the lab.

## 2015-04-17 ENCOUNTER — Encounter: Payer: Self-pay | Admitting: Family Medicine

## 2015-04-19 ENCOUNTER — Telehealth: Payer: Self-pay | Admitting: Family Medicine

## 2015-04-19 MED ORDER — BECLOMETHASONE DIPROPIONATE 80 MCG/ACT IN AERS: 1.0000 | INHALATION_SPRAY | RESPIRATORY_TRACT | Status: DC | PRN

## 2015-04-19 MED ORDER — ALBUTEROL SULFATE HFA 108 (90 BASE) MCG/ACT IN AERS: 2.0000 | INHALATION_SPRAY | Freq: Four times a day (QID) | RESPIRATORY_TRACT | Status: DC | PRN

## 2015-04-19 NOTE — Telephone Encounter (Signed)
Sent in inhalers, but no allergy eye drops on medication list.

## 2015-04-19 NOTE — Telephone Encounter (Signed)
Caller: [place], [place], at [facility] Ph#: [phone] or [phone]  Pt came in asking for refills on the following medications. They have never filled and have no information. albuterol (PROVENTIL HFA;VENTOLIN HFA) 108 (90 BASE) MCG/ACT inhaler beclomethasone (QVAR) 80 MCG/ACT inhaler & Allergy eye drop

## 2015-04-20 ENCOUNTER — Other Ambulatory Visit: Payer: Self-pay | Admitting: Family Medicine

## 2015-04-20 DIAGNOSIS — Z5181 Encounter for therapeutic drug level monitoring: Secondary | ICD-10-CM

## 2015-04-20 DIAGNOSIS — R109 Unspecified abdominal pain: Secondary | ICD-10-CM

## 2015-06-15 ENCOUNTER — Ambulatory Visit (INDEPENDENT_AMBULATORY_CARE_PROVIDER_SITE_OTHER): Payer: 59 | Admitting: Family Medicine

## 2015-06-15 ENCOUNTER — Encounter: Payer: Self-pay | Admitting: Family Medicine

## 2015-06-15 VITALS — BP 130/69 | HR 98 | Temp 96.0°F | Ht 65.0 in | Wt 203.1 lb

## 2015-06-15 DIAGNOSIS — R739 Hyperglycemia, unspecified: Secondary | ICD-10-CM

## 2015-06-15 DIAGNOSIS — E782 Mixed hyperlipidemia: Secondary | ICD-10-CM | POA: Diagnosis not present

## 2015-06-15 DIAGNOSIS — R000 Tachycardia, unspecified: Secondary | ICD-10-CM

## 2015-06-15 DIAGNOSIS — F418 Other specified anxiety disorders: Secondary | ICD-10-CM

## 2015-06-15 DIAGNOSIS — K219 Gastro-esophageal reflux disease without esophagitis: Secondary | ICD-10-CM | POA: Diagnosis not present

## 2015-06-15 DIAGNOSIS — G4733 Obstructive sleep apnea (adult) (pediatric): Secondary | ICD-10-CM

## 2015-06-15 MED ORDER — ESCITALOPRAM OXALATE 20 MG PO TABS: 10.0000 mg | ORAL_TABLET | Freq: Every day | ORAL | Status: DC

## 2015-06-15 MED ORDER — PANTOPRAZOLE SODIUM 40 MG PO TBEC: DELAYED_RELEASE_TABLET | ORAL | Status: DC

## 2015-06-15 MED ORDER — CLONAZEPAM 1 MG PO TABS: 0.5000 mg | ORAL_TABLET | Freq: Two times a day (BID) | ORAL | Status: DC | PRN

## 2015-06-15 MED ORDER — TEMAZEPAM 30 MG PO CAPS: ORAL_CAPSULE | ORAL | Status: DC

## 2015-06-15 MED ORDER — DESVENLAFAXINE SUCCINATE ER 50 MG PO TB24: 50.0000 mg | ORAL_TABLET | Freq: Every day | ORAL | Status: DC

## 2015-06-15 MED ORDER — RANITIDINE HCL 300 MG PO TABS: ORAL_TABLET | ORAL | Status: DC

## 2015-06-15 NOTE — Patient Instructions (Signed)

## 2015-06-15 NOTE — Progress Notes (Signed)
Pre visit review using our clinic review tool, if applicable. No additional management support is needed unless otherwise documented below in the visit note. 

## 2015-06-27 ENCOUNTER — Encounter: Payer: Self-pay | Admitting: Family Medicine

## 2015-06-27 NOTE — Assessment & Plan Note (Signed)
Encouraged heart healthy diet, increase exercise, avoid trans fats, consider a krill oil cap daily 

## 2015-06-27 NOTE — Progress Notes (Signed)
[name] Jensen  [id number] [birth year] 06/27/2015      Progress Note-Follow Up  Subjective  Chief Complaint  Chief Complaint  Patient presents with  . Follow-up    HPI  Patient is a 59 y.o. female in today for routine medical care. Patient is in today for follow up, is frustrated that Venlafaxine has not helped. No concerning side effects just no response. No suicidal or homicidal ideation. No recent illness. Well controlled, no changes to meds. Encouraged heart healthy diet such as the DASH diet and exercise as tolerated.   Past Medical History  Diagnosis Date  . SVT (supraventricular tachycardia)   . Thyroid disease   . Chicken pox as a child  . Mumps as a child  . Hiatal hernia 2007  . Rectocele 06/12/2014  . 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 RLS (restless legs syndrome)   . Anemia     h/o low ferritin  . Anxiety   . Depression   . Asthma     environmental triggers  . GERD (gastroesophageal reflux disease)   . History of viral illness   . Lactose intolerance 06/12/2014  . Tachycardia 06/12/2014  . Adjustment reaction with anxiety and depression 06/12/2014  . Depression with anxiety 06/12/2014  . Gallstones   . Ehrlichiosis 2831  . Diverticulosis   . Sessile colonic polyp   . Hyperlipidemia, mixed 04/11/2015    Past Surgical History  Procedure Laterality Date  . Cholecystectomy    . Appendectomy    . Tonsillectomy  1972    2ith adenoid  . Wisdom tooth extraction  59 yrs old  . Abdominal hysterectomy  11-2008    total with cystocele, rectocele repair  . Skin surgery      right arm scc  . Colonoscopy  2006    diverticulosis, hemorrhoids ([facility])  . Esophagogastroduodenoscopy  2006    small hiatus hernia ([facility])  . Svt ablation    . Incontinence surgery      Family History  Problem Relation Age of Onset  . Hypertension Mother   . Hypertension Father   . Hypertension Sister   . Diabetes Sister     type 2  .  Fibromyalgia Sister   . GI Bleed Maternal Grandmother   . Atrial fibrillation Maternal Grandmother   . Heart disease Maternal Grandfather   . Diabetes Paternal Grandmother   . Stroke Paternal Grandfather   . Proteinuria Sister     History   Social History  . Marital Status: Divorced    Spouse Name: N/A  . Number of Children: 2  . Years of Education: N/A   Occupational History  . RN [employer]   Social History Main Topics  . Smoking status: Never Smoker   . Smokeless tobacco: Never Used  . Alcohol Use: No  . Drug Use: No  . Sexual Activity: Not on file     Comment: lives by self with 4 dogs works at [employer], avoids dairy   Other Topics Concern  . Not on file   Social History Narrative    Current Outpatient Prescriptions on File Prior to Visit  Medication Sig Dispense Refill  . albuterol (PROVENTIL HFA;VENTOLIN HFA) 108 (90 BASE) MCG/ACT inhaler Inhale 2 puffs into the lungs every 6 (six) hours as needed for wheezing. 1 Inhaler 6  . aspirin (ASPIRIN EC) 81 MG EC tablet Take 81 mg by mouth at bedtime.     . beclomethasone (  QVAR) 80 MCG/ACT inhaler Inhale 1 puff into the lungs as needed (shotness of breath). 1 Inhaler 6  . diltiazem (CARDIZEM CD) 120 MG 24 hr capsule Take 120 mg by mouth daily.    Marland Kitchen diltiazem (CARDIZEM) 60 MG tablet Take 60 mg by mouth 2 (two) times daily.     No current facility-administered medications on file prior to visit.    Allergies  Allergen Reactions  . Dilaudid [Hydromorphone Hcl]     Respiratory , cardiac   . Effexor [Venlafaxine] Other (See Comments)    myalgia  . Abilify [Aripiprazole] Palpitations    Review of Systems  Review of Systems  Constitutional: Positive for malaise/fatigue. Negative for fever.  HENT: Negative for congestion.   Eyes: Negative for discharge.  Respiratory: Negative for shortness of breath.   Cardiovascular: Negative for chest pain, palpitations and leg swelling.  Gastrointestinal: Negative for nausea,  abdominal pain and diarrhea.  Genitourinary: Negative for dysuria.  Musculoskeletal: Positive for myalgias. Negative for falls.  Skin: Negative for rash.  Neurological: Negative for loss of consciousness and headaches.  Endo/Heme/Allergies: Negative for polydipsia.  Psychiatric/Behavioral: Positive for depression. Negative for suicidal ideas. The patient is nervous/anxious. The patient does not have insomnia.     Objective  BP 130/69 mmHg  Pulse 98  Temp(Src) 96 F (35.6 C) (Oral)  Ht 5\' 5"  (1.651 m)  Wt 203 lb 2 oz (92.137 kg)  BMI 33.80 kg/m2  SpO2 96%  Physical Exam  Physical Exam  Constitutional: She is oriented to person, place, and time and well-developed, well-nourished, and in no distress. No distress.  HENT:  Head: Normocephalic and atraumatic.  Eyes: Conjunctivae are normal.  Neck: Neck supple. No thyromegaly present.  Cardiovascular: Normal rate, regular rhythm and normal heart sounds.   No murmur heard. Pulmonary/Chest: Effort normal and breath sounds normal. She has no wheezes.  Abdominal: She exhibits no distension and no mass.  Musculoskeletal: She exhibits no edema.  Lymphadenopathy:    She has no cervical adenopathy.  Neurological: She is alert and oriented to person, place, and time.  Skin: Skin is warm and dry. No rash noted. She is not diaphoretic.  Psychiatric: Memory, affect and judgment normal.    Lab Results  Component Value Date   TSH 1.19 04/15/2015   Lab Results  Component Value Date   WBC 8.8 09/28/2014   HGB 13.6 09/28/2014   HCT 41.3 09/28/2014   MCV 84.1 09/28/2014   PLT 343.0 09/28/2014   Lab Results  Component Value Date   CREATININE 0.77 04/15/2015   BUN 14 04/15/2015   NA 140 04/15/2015   K 4.2 04/15/2015   CL 104 04/15/2015   CO2 29 04/15/2015   Lab Results  Component Value Date   ALT 23 04/15/2015   AST 19 04/15/2015   ALKPHOS 125* 04/15/2015   BILITOT 0.6 04/15/2015   Lab Results  Component Value Date   CHOL  206* 04/15/2015   Lab Results  Component Value Date   HDL 72.40 04/15/2015   Lab Results  Component Value Date   LDLCALC 100* 04/15/2015   Lab Results  Component Value Date   TRIG 168.0* 04/15/2015   Lab Results  Component Value Date   CHOLHDL 3 04/15/2015     Assessment & Plan  OSA (obstructive sleep apnea) Using CPAP routinely  Hyperlipidemia, mixed Encouraged heart healthy diet, increase exercise, avoid trans fats, consider a krill oil cap daily  GERD (gastroesophageal reflux disease) Avoid offending foods, start probiotics. Do  not eat large meals in late evening and consider raising head of bed.   Depression with anxiety Did not respond to Effexor agrees to try Pristiq. May use clonazepam prn

## 2015-06-27 NOTE — Assessment & Plan Note (Signed)
Did not respond to Effexor agrees to try Pristiq. May use clonazepam prn

## 2015-06-27 NOTE — Assessment & Plan Note (Signed)
Using CPAP routinely 

## 2015-06-27 NOTE — Assessment & Plan Note (Signed)
Avoid offending foods, start probiotics. Do not eat large meals in late evening and consider raising head of bed.  

## 2015-07-14 ENCOUNTER — Other Ambulatory Visit: Payer: Self-pay | Admitting: Internal Medicine

## 2015-07-28 ENCOUNTER — Other Ambulatory Visit (INDEPENDENT_AMBULATORY_CARE_PROVIDER_SITE_OTHER): Payer: 59

## 2015-07-28 ENCOUNTER — Telehealth: Payer: Self-pay

## 2015-07-28 DIAGNOSIS — R000 Tachycardia, unspecified: Secondary | ICD-10-CM | POA: Diagnosis not present

## 2015-07-28 DIAGNOSIS — R739 Hyperglycemia, unspecified: Secondary | ICD-10-CM | POA: Diagnosis not present

## 2015-07-28 DIAGNOSIS — R109 Unspecified abdominal pain: Secondary | ICD-10-CM

## 2015-07-28 DIAGNOSIS — Z5181 Encounter for therapeutic drug level monitoring: Secondary | ICD-10-CM | POA: Diagnosis not present

## 2015-07-28 DIAGNOSIS — E782 Mixed hyperlipidemia: Secondary | ICD-10-CM | POA: Diagnosis not present

## 2015-07-28 DIAGNOSIS — K219 Gastro-esophageal reflux disease without esophagitis: Secondary | ICD-10-CM

## 2015-07-28 DIAGNOSIS — R7989 Other specified abnormal findings of blood chemistry: Secondary | ICD-10-CM

## 2015-07-28 DIAGNOSIS — F418 Other specified anxiety disorders: Secondary | ICD-10-CM

## 2015-07-28 LAB — COMPREHENSIVE METABOLIC PANEL
ALT: 18 U/L (ref 0–35)
AST: 14 U/L (ref 0–37)
Albumin: 4.5 g/dL (ref 3.5–5.2)
Alkaline Phosphatase: 102 U/L (ref 39–117)
BUN: 17 mg/dL (ref 6–23)
CALCIUM: 9.7 mg/dL (ref 8.4–10.5)
CHLORIDE: 104 meq/L (ref 96–112)
CO2: 25 mEq/L (ref 19–32)
Creatinine, Ser: 0.71 mg/dL (ref 0.40–1.20)
GFR: 90.18 mL/min (ref >60.00)
Glucose, Bld: 116 mg/dL — ABNORMAL HIGH (ref 70–99)
POTASSIUM: 4.2 meq/L (ref 3.5–5.1)
Sodium: 139 mEq/L (ref 135–145)
Total Bilirubin: 0.3 mg/dL (ref 0.2–1.2)
Total Protein: 7.7 g/dL (ref 6.0–8.3)

## 2015-07-28 LAB — CBC
HEMATOCRIT: 42.3 % (ref 36.0–46.0)
Hemoglobin: 14.4 g/dL (ref 12.0–15.0)
MCHC: 34 g/dL (ref 30.0–36.0)
MCV: 83.3 fl (ref 78.0–100.0)
Platelets: 341 10*3/uL (ref 150.0–400.0)
RBC: 5.07 Mil/uL (ref 3.87–5.11)
RDW: 13.4 % (ref 11.5–15.5)
WBC: 7.6 10*3/uL (ref 4.0–10.5)

## 2015-07-28 LAB — LIPID PANEL
CHOL/HDL RATIO: 4
CHOLESTEROL: 189 mg/dL (ref 0–200)
HDL: 47.2 mg/dL (ref >39.00)
NonHDL: 142.01
Triglycerides: 227 mg/dL — ABNORMAL HIGH (ref 0.0–149.0)
VLDL: 45.4 mg/dL — AB (ref 0.0–40.0)

## 2015-07-28 LAB — LDL CHOLESTEROL, DIRECT: LDL DIRECT: 96 mg/dL

## 2015-07-28 LAB — MAGNESIUM: MAGNESIUM: 2.2 mg/dL (ref 1.5–2.5)

## 2015-07-28 LAB — VITAMIN D 25 HYDROXY (VIT D DEFICIENCY, FRACTURES): VITD: 15.88 ng/mL — ABNORMAL LOW (ref 30.00–100.00)

## 2015-07-28 LAB — HEMOGLOBIN A1C: Hgb A1c MFr Bld: 6 % (ref 4.6–6.5)

## 2015-07-28 LAB — TSH: TSH: 0.98 u[IU]/mL (ref 0.35–4.50)

## 2015-07-28 MED ORDER — VITAMIN D (ERGOCALCIFEROL) 1.25 MG (50000 UNIT) PO CAPS: 50000.0000 [IU] | ORAL_CAPSULE | ORAL | Status: DC

## 2015-07-28 NOTE — Telephone Encounter (Signed)
-----   Message from [name], MD sent at 07/28/2015 12:13 PM EDT ----- Labs look pretty good except vit D lower, have her start Vit D 50000 IU caps 1 cap po weekly x 12 weeks, disp #4 with 3 rf. Also start OTC 2000 IU daily.

## 2015-07-29 NOTE — Telephone Encounter (Signed)
Spoke with patient and she verbalized understanding to the below instructions, she has agreed to start the Vitamin D 50,000 and also take the 2,000 units as well. She wanted you to know that she has Mold in her office they have been moved to [employer], she thinks the cough that she has had is related to [facility] and she is requesting a Cxr. She has an apt next week.       KP

## 2015-08-03 ENCOUNTER — Encounter: Payer: Self-pay | Admitting: Family Medicine

## 2015-08-03 ENCOUNTER — Ambulatory Visit (INDEPENDENT_AMBULATORY_CARE_PROVIDER_SITE_OTHER): Payer: 59 | Admitting: Family Medicine

## 2015-08-03 VITALS — BP 132/84 | HR 82 | Temp 98.2°F | Ht 65.0 in | Wt 194.5 lb

## 2015-08-03 DIAGNOSIS — R05 Cough: Secondary | ICD-10-CM | POA: Diagnosis not present

## 2015-08-03 DIAGNOSIS — E782 Mixed hyperlipidemia: Secondary | ICD-10-CM

## 2015-08-03 DIAGNOSIS — R059 Cough, unspecified: Secondary | ICD-10-CM

## 2015-08-03 DIAGNOSIS — R739 Hyperglycemia, unspecified: Secondary | ICD-10-CM | POA: Diagnosis not present

## 2015-08-03 DIAGNOSIS — E559 Vitamin D deficiency, unspecified: Secondary | ICD-10-CM | POA: Diagnosis not present

## 2015-08-03 NOTE — Progress Notes (Signed)
Pre visit review using our clinic review tool, if applicable. No additional management support is needed unless otherwise documented below in the visit note. 

## 2015-08-03 NOTE — Patient Instructions (Addendum)
NOW company probiotics 10 strain daily, can order at [email or web address]  Cholesterol Cholesterol is a white, waxy, fat-like substance needed by your body in small amounts. The liver makes all the cholesterol you need. Cholesterol is carried from the liver by the blood through the blood vessels. Deposits of cholesterol (plaque) may build up on blood vessel walls. These make the arteries narrower and stiffer. Cholesterol plaques increase the risk for heart attack and stroke.  You cannot feel your cholesterol level even if it is very high. The only way to know it is high is with a blood test. Once you know your cholesterol levels, you should keep a record of the test results. Work with your health care provider to keep your levels in the desired range.  WHAT DO THE RESULTS MEAN?  Total cholesterol is a rough measure of all the cholesterol in your blood.   LDL is the so-called bad cholesterol. This is the type that deposits cholesterol in the walls of the arteries. You want this level to be low.   HDL is the good cholesterol because it cleans the arteries and carries the LDL away. You want this level to be high.  Triglycerides are fat that the body can either burn for energy or store. High levels are closely linked to heart disease.  WHAT ARE THE DESIRED LEVELS OF CHOLESTEROL?  Total cholesterol below 200.   LDL below 100 for people at risk, below 70 for those at very high risk.   HDL above 50 is good, above 60 is best.   Triglycerides below 150.  HOW CAN I LOWER MY CHOLESTEROL?  Diet. Follow your diet programs as directed by your health care provider.   Choose fish or white meat chicken and [place], roasted or baked. Limit fatty cuts of red meat, fried foods, and processed meats, such as sausage and lunch meats.   Eat lots of fresh fruits and vegetables.  Choose whole grains, beans, pasta, potatoes, and cereals.   Use only small amounts of olive, corn, or canola oils.   Avoid  butter, mayonnaise, shortening, or palm kernel oils.  Avoid foods with trans fats.   Drink skim or nonfat milk and eat low-fat or nonfat yogurt and cheeses. Avoid whole milk, cream, ice cream, egg yolks, and full-fat cheeses.   Healthy desserts include angel food cake, ginger snaps, animal crackers, hard candy, popsicles, and low-fat or nonfat frozen yogurt. Avoid pastries, cakes, pies, and cookies.   Exercise. Follow your exercise programs as directed by your health care provider.   A regular program helps decrease LDL and raise HDL.   A regular program helps with weight control.   Do things that increase your activity level like gardening, walking, or taking the stairs. Ask your health care provider about how you can be more active in your daily life.   Medicine. Take medicine only as directed by your health care provider.   Medicine may be prescribed by your health care provider to help lower cholesterol and decrease the risk for heart disease.   If you have several risk factors, you may need medicine even if your levels are normal. Document Released: 08/15/2001 Document Revised: 04/06/2014 Document Reviewed: 09/03/2013 [facility] Patient Information 2015 [place], [place]. This information is not intended to replace advice given to you by your health care provider. Make sure you discuss any questions you have with your health care provider.

## 2015-08-10 ENCOUNTER — Other Ambulatory Visit: Payer: Self-pay | Admitting: Family Medicine

## 2015-08-10 NOTE — Telephone Encounter (Signed)
Called left message to call back 

## 2015-08-10 NOTE — Telephone Encounter (Signed)
Can have to pick up by end of week. I will print

## 2015-08-10 NOTE — Telephone Encounter (Signed)
Patient unable to pickup on Friday.  Will hold on my desk until Friday 08/13/15 and will then fax to [facility].

## 2015-08-10 NOTE — Telephone Encounter (Signed)
Requesting: Clonazepam Contract none UDS  None Last OV  08/03/15 Last Refill   06/15/15 #60 with 1 refill  Please Advise

## 2015-08-11 ENCOUNTER — Other Ambulatory Visit: Payer: Self-pay | Admitting: Family Medicine

## 2015-08-11 NOTE — Telephone Encounter (Signed)
LOV 07/28/15  clonazePAM (KLONOPIN) 1 MG tablet [Pharmacy Med Name: CLONAZEPAM 1 MG TABLET 1 MG TAB]  TAKE 1/2-1 TABLET BY MOUTH 2 TIMES A DAY AS NEEDED FOR ANXIETY, Disp-60 tablet, R-1, Normal     Please advise.

## 2015-08-22 ENCOUNTER — Encounter: Payer: Self-pay | Admitting: Family Medicine

## 2015-08-22 DIAGNOSIS — E559 Vitamin D deficiency, unspecified: Secondary | ICD-10-CM

## 2015-08-22 DIAGNOSIS — R059 Cough, unspecified: Secondary | ICD-10-CM

## 2015-08-22 DIAGNOSIS — R05 Cough: Secondary | ICD-10-CM | POA: Insufficient documentation

## 2015-08-22 HISTORY — DX: Cough, unspecified: R05.9

## 2015-08-22 HISTORY — DX: Vitamin D deficiency, unspecified: E55.9

## 2015-08-22 NOTE — Assessment & Plan Note (Signed)
Encouraged heart healthy diet, increase exercise, avoid trans fats, consider a krill oil cap daily 

## 2015-08-22 NOTE — Assessment & Plan Note (Signed)
Likely multifactorial, encouraged to minimize heartburn. Avoid offending foods, start probiotics. Do not eat large meals in late evening and consider raising head of bed. Also check sputum cultures. Use Albuterol prn.

## 2015-08-22 NOTE — Progress Notes (Signed)
Subjective:    Patient ID: [name] Jensen, female    DOB: Jul 18, 1956, 59 y.o.   MRN: [id number]  Chief Complaint  Patient presents with  . Follow-up    8 week    HPI Patient is in today for follow-up. Continues to struggle with heartburn and a cough. She reports reflux symptoms of the throat roughly 5 times a week. No vomiting. Has a cough which is productive of clear phlegm intermittently. No fevers or chills. No other recent illness. Continues to struggle with stress and anxiety but finds it tolerable. Denies CP/palp/SOB/HA/congestion/fevers/GI or GU c/o. Taking meds as prescribed  Past Medical History  Diagnosis Date  . SVT (supraventricular tachycardia)   . Thyroid disease   . Chicken pox as a child  . Mumps as a child  . Hiatal hernia 2007  . Rectocele 06/12/2014  . 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 RLS (restless legs syndrome)   . Anemia     h/o low ferritin  . Anxiety   . Depression   . Asthma     environmental triggers  . GERD (gastroesophageal reflux disease)   . History of viral illness   . Lactose intolerance 06/12/2014  . Tachycardia 06/12/2014  . Adjustment reaction with anxiety and depression 06/12/2014  . Depression with anxiety 06/12/2014  . Gallstones   . Ehrlichiosis 7353  . Diverticulosis   . Sessile colonic polyp   . Hyperlipidemia, mixed 04/11/2015  . Vitamin D deficiency 08/22/2015  . Cough 08/22/2015    Past Surgical History  Procedure Laterality Date  . Cholecystectomy    . Appendectomy    . Tonsillectomy  1972    2ith adenoid  . Wisdom tooth extraction  59 yrs old  . Abdominal hysterectomy  11-2008    total with cystocele, rectocele repair  . Skin surgery      right arm scc  . Colonoscopy  2006    diverticulosis, hemorrhoids ([facility])  . Esophagogastroduodenoscopy  2006    small hiatus hernia ([facility])  . Svt ablation    . Incontinence surgery      Family History  Problem Relation Age of Onset  .  Hypertension Mother   . Hypertension Father   . Hypertension Sister   . Diabetes Sister     type 2  . Fibromyalgia Sister   . GI Bleed Maternal Grandmother   . Atrial fibrillation Maternal Grandmother   . Heart disease Maternal Grandfather   . Diabetes Paternal Grandmother   . Stroke Paternal Grandfather   . Proteinuria Sister     Social History   Social History  . Marital Status: Divorced    Spouse Name: N/A  . Number of Children: 2  . Years of Education: N/A   Occupational History  . RN [employer]   Social History Main Topics  . Smoking status: Never Smoker   . Smokeless tobacco: Never Used  . Alcohol Use: No  . Drug Use: No  . Sexual Activity: Not on file     Comment: lives by self with 4 dogs works at [employer], avoids dairy   Other Topics Concern  . Not on file   Social History Narrative    Outpatient Prescriptions Prior to Visit  Medication Sig Dispense Refill  . albuterol (PROVENTIL HFA;VENTOLIN HFA) 108 (90 BASE) MCG/ACT inhaler Inhale 2 puffs into the lungs every 6 (six) hours as needed for wheezing. 1 Inhaler 6  . aspirin (ASPIRIN  EC) 81 MG EC tablet Take 81 mg by mouth at bedtime.     . beclomethasone (QVAR) 80 MCG/ACT inhaler Inhale 1 puff into the lungs as needed (shotness of breath). 1 Inhaler 6  . CARTIA XT 120 MG 24 hr capsule TAKE 1 CAPSULE BY MOUTH ONCE DAILY 90 capsule 3  . cholecalciferol (VITAMIN D) 1000 UNITS tablet Take 1,000 Units by mouth daily.    Marland Kitchen desvenlafaxine (PRISTIQ) 50 MG 24 hr tablet Take 1 tablet (50 mg total) by mouth daily. 30 tablet 3  . diltiazem (CARDIZEM) 60 MG tablet Take 60 mg by mouth 2 (two) times daily.    Marland Kitchen escitalopram (LEXAPRO) 20 MG tablet Take 0.5 tablets (10 mg total) by mouth daily. Please take 1/2 tablet daily. 30 tablet 2  . pantoprazole (PROTONIX) 40 MG tablet TAKE 1 TABLET (40 MG TOTAL) BY MOUTH DAILY. 30 tablet 6  . ranitidine (ZANTAC) 300 MG tablet TAKE 1 TABLET (300 MG TOTAL) BY MOUTH AT BEDTIME AS NEEDED  FOR HEARTBURN. 30 tablet 6  . temazepam (RESTORIL) 30 MG capsule TAKE 1 CAPSULE BY MOUTH DAILY AT BEDTIME 30 capsule 2  . Vitamin D, Ergocalciferol, (DRISDOL) 50000 UNITS CAPS capsule Take 1 capsule (50,000 Units total) by mouth every 7 (seven) days. 4 capsule 3  . clonazePAM (KLONOPIN) 1 MG tablet Take 0.5-1 tablets (0.5-1 mg total) by mouth 2 (two) times daily as needed for anxiety. 60 tablet 1   No facility-administered medications prior to visit.    Allergies  Allergen Reactions  . Dilaudid [Hydromorphone Hcl]     Respiratory , cardiac   . Effexor [Venlafaxine] Other (See Comments)    myalgia  . Abilify [Aripiprazole] Palpitations    Review of Systems  Constitutional: Negative for fever and malaise/fatigue.  HENT: Negative for congestion.   Eyes: Negative for discharge.  Respiratory: Positive for cough. Negative for shortness of breath.   Cardiovascular: Negative for chest pain, palpitations and leg swelling.  Gastrointestinal: Negative for nausea and abdominal pain.  Genitourinary: Negative for dysuria.  Musculoskeletal: Negative for falls.  Skin: Negative for rash.  Neurological: Negative for loss of consciousness and headaches.  Endo/Heme/Allergies: Negative for environmental allergies.  Psychiatric/Behavioral: Negative for depression. The patient is nervous/anxious.        Objective:    Physical Exam  Constitutional: She is oriented to person, place, and time. She appears well-developed and well-nourished. No distress.  HENT:  Head: Normocephalic and atraumatic.  Nose: Nose normal.  Eyes: Right eye exhibits no discharge. Left eye exhibits no discharge.  Neck: Normal range of motion. Neck supple.  Cardiovascular: Normal rate and regular rhythm.   No murmur heard. Pulmonary/Chest: Effort normal and breath sounds normal.  Abdominal: Soft. Bowel sounds are normal. There is no tenderness.  Musculoskeletal: She exhibits no edema.  Neurological: She is alert and  oriented to person, place, and time.  Skin: Skin is warm and dry.  Psychiatric: She has a normal mood and affect.  Nursing note and vitals reviewed.   BP 132/84 mmHg  Pulse 82  Temp(Src) 98.2 F (36.8 C) (Oral)  Ht 5\' 5"  (1.651 m)  Wt 194 lb 8 oz (88.225 kg)  BMI 32.37 kg/m2  SpO2 97% Wt Readings from Last 3 Encounters:  08/03/15 194 lb 8 oz (88.225 kg)  06/15/15 203 lb 2 oz (92.137 kg)  04/06/15 190 lb (86.183 kg)     Lab Results  Component Value Date   WBC 7.6 07/28/2015   HGB 14.4 07/28/2015  HCT 42.3 07/28/2015   PLT 341.0 07/28/2015   GLUCOSE 116* 07/28/2015   CHOL 189 07/28/2015   TRIG 227.0* 07/28/2015   HDL 47.20 07/28/2015   LDLDIRECT 96.0 07/28/2015   LDLCALC 100* 04/15/2015   ALT 18 07/28/2015   AST 14 07/28/2015   NA 139 07/28/2015   K 4.2 07/28/2015   CL 104 07/28/2015   CREATININE 0.71 07/28/2015   BUN 17 07/28/2015   CO2 25 07/28/2015   TSH 0.98 07/28/2015   INR 1.0 11/09/2008   HGBA1C 6.0 07/28/2015    Lab Results  Component Value Date   TSH 0.98 07/28/2015   Lab Results  Component Value Date   WBC 7.6 07/28/2015   HGB 14.4 07/28/2015   HCT 42.3 07/28/2015   MCV 83.3 07/28/2015   PLT 341.0 07/28/2015   Lab Results  Component Value Date   NA 139 07/28/2015   K 4.2 07/28/2015   CO2 25 07/28/2015   GLUCOSE 116* 07/28/2015   BUN 17 07/28/2015   CREATININE 0.71 07/28/2015   BILITOT 0.3 07/28/2015   ALKPHOS 102 07/28/2015   AST 14 07/28/2015   ALT 18 07/28/2015   PROT 7.7 07/28/2015   ALBUMIN 4.5 07/28/2015   CALCIUM 9.7 07/28/2015   GFR 90.18 07/28/2015   Lab Results  Component Value Date   CHOL 189 07/28/2015   Lab Results  Component Value Date   HDL 47.20 07/28/2015   Lab Results  Component Value Date   LDLCALC 100* 04/15/2015   Lab Results  Component Value Date   TRIG 227.0* 07/28/2015   Lab Results  Component Value Date   CHOLHDL 4 07/28/2015   Lab Results  Component Value Date   HGBA1C 6.0 07/28/2015        Assessment & Plan:   Problem List Items Addressed This Visit    Vitamin D deficiency    Patient started on vit d 50000 IU weekly and 2000 IU daily, recheck in 12 weeks      Hyperlipidemia, mixed    Encouraged heart healthy diet, increase exercise, avoid trans fats, consider a krill oil cap daily      Hyperglycemia    hgba1c acceptable, minimize simple carbs. Increase exercise as tolerated. Continue current meds      Cough - Primary    Likely multifactorial, encouraged to minimize heartburn. Avoid offending foods, start probiotics. Do not eat large meals in late evening and consider raising head of bed. Also check sputum cultures. Use Albuterol prn.      Relevant Orders   Respiratory or Resp and Sputum Culture   DG Chest 2 View      I am having Ms. Starke maintain her aspirin, diltiazem, albuterol, beclomethasone, cholecalciferol, ranitidine, pantoprazole, temazepam, desvenlafaxine, escitalopram, CARTIA XT, and Vitamin D (Ergocalciferol).  No orders of the defined types were placed in this encounter.     Penni Homans, MD

## 2015-08-22 NOTE — Assessment & Plan Note (Signed)
hgba1c acceptable, minimize simple carbs. Increase exercise as tolerated. Continue current meds 

## 2015-08-22 NOTE — Assessment & Plan Note (Signed)
Patient started on vit d 50000 IU weekly and 2000 IU daily, recheck in 12 weeks

## 2015-09-15 ENCOUNTER — Ambulatory Visit (INDEPENDENT_AMBULATORY_CARE_PROVIDER_SITE_OTHER)
Admission: RE | Admit: 2015-09-15 | Discharge: 2015-09-15 | Disposition: A | Discharge status: Home / Self Care | Payer: 59 | Source: Ambulatory Visit | Attending: Family Medicine | Admitting: Family Medicine

## 2015-09-15 DIAGNOSIS — R05 Cough: Secondary | ICD-10-CM | POA: Diagnosis not present

## 2015-09-15 DIAGNOSIS — R059 Cough, unspecified: Secondary | ICD-10-CM

## 2015-09-16 ENCOUNTER — Other Ambulatory Visit: Payer: Self-pay | Admitting: Internal Medicine

## 2015-09-21 ENCOUNTER — Ambulatory Visit (INDEPENDENT_AMBULATORY_CARE_PROVIDER_SITE_OTHER): Payer: 59 | Admitting: Family Medicine

## 2015-09-21 ENCOUNTER — Other Ambulatory Visit: Payer: Self-pay

## 2015-09-21 ENCOUNTER — Encounter: Payer: Self-pay | Admitting: Family Medicine

## 2015-09-21 VITALS — BP 134/86 | HR 89 | Temp 98.0°F | Ht 65.0 in | Wt 198.4 lb

## 2015-09-21 DIAGNOSIS — R635 Abnormal weight gain: Secondary | ICD-10-CM

## 2015-09-21 DIAGNOSIS — E782 Mixed hyperlipidemia: Secondary | ICD-10-CM | POA: Diagnosis not present

## 2015-09-21 DIAGNOSIS — R739 Hyperglycemia, unspecified: Secondary | ICD-10-CM

## 2015-09-21 DIAGNOSIS — R059 Cough, unspecified: Secondary | ICD-10-CM

## 2015-09-21 DIAGNOSIS — K219 Gastro-esophageal reflux disease without esophagitis: Secondary | ICD-10-CM

## 2015-09-21 DIAGNOSIS — J4520 Mild intermittent asthma, uncomplicated: Secondary | ICD-10-CM

## 2015-09-21 DIAGNOSIS — E559 Vitamin D deficiency, unspecified: Secondary | ICD-10-CM | POA: Diagnosis not present

## 2015-09-21 DIAGNOSIS — R05 Cough: Secondary | ICD-10-CM

## 2015-09-21 MED ORDER — ALBUTEROL SULFATE HFA 108 (90 BASE) MCG/ACT IN AERS: 2.0000 | INHALATION_SPRAY | Freq: Four times a day (QID) | RESPIRATORY_TRACT | Status: DC | PRN

## 2015-09-21 MED ORDER — DILTIAZEM HCL 60 MG PO TABS: 60.0000 mg | ORAL_TABLET | Freq: Two times a day (BID) | ORAL | Status: DC

## 2015-09-21 MED ORDER — BECLOMETHASONE DIPROPIONATE 80 MCG/ACT IN AERS: 1.0000 | INHALATION_SPRAY | RESPIRATORY_TRACT | Status: DC | PRN

## 2015-09-21 MED ORDER — LORCASERIN HCL 10 MG PO TABS: 10.0000 mg | ORAL_TABLET | Freq: Two times a day (BID) | ORAL | Status: DC

## 2015-09-21 NOTE — Progress Notes (Signed)
Pre visit review using our clinic review tool, if applicable. No additional management support is needed unless otherwise documented below in the visit note. 

## 2015-09-21 NOTE — Patient Instructions (Signed)
DASH Eating Plan  DASH stands for "Dietary Approaches to Stop Hypertension." The DASH eating plan is a healthy eating plan that has been shown to reduce high blood pressure (hypertension). Additional health benefits may include reducing the risk of type 2 diabetes mellitus, heart disease, and stroke. The DASH eating plan may also help with weight loss.  WHAT DO I NEED TO KNOW ABOUT THE DASH EATING PLAN?  For the DASH eating plan, you will follow these general guidelines:  · Choose foods with a percent daily value for sodium of less than 5% (as listed on the food label).  · Use salt-free seasonings or herbs instead of table salt or sea salt.  · Check with your health care provider or pharmacist before using salt substitutes.  · Eat lower-sodium products, often labeled as "lower sodium" or "no salt added."  · Eat fresh foods.  · Eat more vegetables, fruits, and low-fat dairy products.  · Choose whole grains. Look for the word "whole" as the first word in the ingredient list.  · Choose fish and skinless chicken or turkey more often than red meat. Limit fish, poultry, and meat to 6 oz (170 g) each day.  · Limit sweets, desserts, sugars, and sugary drinks.  · Choose heart-healthy fats.  · Limit cheese to 1 oz (28 g) per day.  · Eat more home-cooked food and less restaurant, buffet, and fast food.  · Limit fried foods.  · Cook foods using methods other than frying.  · Limit canned vegetables. If you do use them, rinse them well to decrease the sodium.  · When eating at a restaurant, ask that your food be prepared with less salt, or no salt if possible.  WHAT FOODS CAN I EAT?  Seek help from a dietitian for individual calorie needs.  Grains  Whole grain or whole wheat bread. Brown rice. Whole grain or whole wheat pasta. Quinoa, bulgur, and whole grain cereals. Low-sodium cereals. Corn or whole wheat flour tortillas. Whole grain cornbread. Whole grain crackers. Low-sodium crackers.  Vegetables  Fresh or frozen vegetables  (raw, steamed, roasted, or grilled). Low-sodium or reduced-sodium tomato and vegetable juices. Low-sodium or reduced-sodium tomato sauce and paste. Low-sodium or reduced-sodium canned vegetables.   Fruits  All fresh, canned (in natural juice), or frozen fruits.  Meat and Other Protein Products  Ground beef (85% or leaner), grass-fed beef, or beef trimmed of fat. Skinless chicken or turkey. Ground chicken or turkey. Pork trimmed of fat. All fish and seafood. Eggs. Dried beans, peas, or lentils. Unsalted nuts and seeds. Unsalted canned beans.  Dairy  Low-fat dairy products, such as skim or 1% milk, 2% or reduced-fat cheeses, low-fat ricotta or cottage cheese, or plain low-fat yogurt. Low-sodium or reduced-sodium cheeses.  Fats and Oils  Tub margarines without trans fats. Light or reduced-fat mayonnaise and salad dressings (reduced sodium). Avocado. Safflower, olive, or canola oils. Natural peanut or almond butter.  Other  Unsalted popcorn and pretzels.  The items listed above may not be a complete list of recommended foods or beverages. Contact your dietitian for more options.  WHAT FOODS ARE NOT RECOMMENDED?  Grains  White bread. White pasta. White rice. Refined cornbread. Bagels and croissants. Crackers that contain trans fat.  Vegetables  Creamed or fried vegetables. Vegetables in a cheese sauce. Regular canned vegetables. Regular canned tomato sauce and paste. Regular tomato and vegetable juices.  Fruits  Dried fruits. Canned fruit in light or heavy syrup. Fruit juice.  Meat and Other Protein   Products  Fatty cuts of meat. Ribs, chicken wings, bacon, sausage, bologna, salami, chitterlings, fatback, hot dogs, bratwurst, and packaged luncheon meats. Salted nuts and seeds. Canned beans with salt.  Dairy  Whole or 2% milk, cream, half-and-half, and cream cheese. Whole-fat or sweetened yogurt. Full-fat cheeses or blue cheese. Nondairy creamers and whipped toppings. Processed cheese, cheese spreads, or cheese  curds.  Condiments  Onion and garlic salt, seasoned salt, table salt, and sea salt. Canned and packaged gravies. Worcestershire sauce. Tartar sauce. Barbecue sauce. Teriyaki sauce. Soy sauce, including reduced sodium. Steak sauce. Fish sauce. Oyster sauce. Cocktail sauce. Horseradish. Ketchup and mustard. Meat flavorings and tenderizers. Bouillon cubes. Hot sauce. Tabasco sauce. Marinades. Taco seasonings. Relishes.  Fats and Oils  Butter, stick margarine, lard, shortening, ghee, and bacon fat. Coconut, palm kernel, or palm oils. Regular salad dressings.  Other  Pickles and olives. Salted popcorn and pretzels.  The items listed above may not be a complete list of foods and beverages to avoid. Contact your dietitian for more information.  WHERE CAN I FIND MORE INFORMATION?  National Heart, Lung, and [employer] Institute: www.nhlbi.nih.gov/health/health-topics/topics/dash/     This information is not intended to replace advice given to you by your health care provider. Make sure you discuss any questions you have with your health care provider.     Document Released: 11/09/2011 Document Revised: 12/11/2014 Document Reviewed: 09/24/2013  Elsevier Interactive Patient Education ©2016 Elsevier Inc.

## 2015-09-22 ENCOUNTER — Telehealth: Payer: Self-pay

## 2015-09-22 NOTE — Telephone Encounter (Signed)
Manuela Schwartz from Pharmacy called,states Patient order for Qvar needs to be changed from PRN because it is not designed to be a PRN medication it is maintenance.

## 2015-09-22 NOTE — Telephone Encounter (Signed)
Please change prescription on QVar to ready 1 puff po qd and resubmit to pharmacy.

## 2015-09-23 MED ORDER — BECLOMETHASONE DIPROPIONATE 80 MCG/ACT IN AERS: 1.0000 | INHALATION_SPRAY | Freq: Every day | RESPIRATORY_TRACT | Status: DC

## 2015-09-23 NOTE — Addendum Note (Signed)
Addended by: Sharon Seller B on: 09/23/2015 07:20 AM   Modules accepted: Orders

## 2015-09-28 ENCOUNTER — Telehealth: Payer: Self-pay | Admitting: *Deleted

## 2015-09-28 NOTE — Telephone Encounter (Signed)
PA for Belviq initiated. Awaiting determination. JG//CMA

## 2015-09-30 NOTE — Telephone Encounter (Signed)
PA approved effective 09/28/2015 through 12/28/2015. Approval letter sent for scanning. JG//CMA

## 2015-10-03 ENCOUNTER — Encounter: Payer: Self-pay | Admitting: Family Medicine

## 2015-10-03 NOTE — Assessment & Plan Note (Signed)
hgba1c acceptable, minimize simple carbs. Increase exercise as tolerated.  

## 2015-10-03 NOTE — Assessment & Plan Note (Signed)
Encouraged DASH diet, decrease po intake and increase exercise as tolerated. Needs 7-8 hours of sleep nightly. Avoid trans fats, eat small, frequent meals every 4-5 hours with lean proteins, complex carbs and healthy fats. Minimize simple carbs, GMO foods. 

## 2015-10-03 NOTE — Assessment & Plan Note (Signed)
Encouraged heart healthy diet, increase exercise, avoid trans fats, consider a krill oil cap daily 

## 2015-10-03 NOTE — Assessment & Plan Note (Signed)
Tolerating hi dose supplements. Continue to monitor

## 2015-10-03 NOTE — Assessment & Plan Note (Signed)
Avoid offending foods, start probiotics. Do not eat large meals in late evening and consider raising head of bed.  

## 2015-10-03 NOTE — Assessment & Plan Note (Signed)
Refill given on Albuterol

## 2015-10-03 NOTE — Assessment & Plan Note (Signed)
Improving with treatments will refer if worsens once again

## 2015-10-03 NOTE — Progress Notes (Signed)
Subjective:    Patient ID: [name] Jensen, female    DOB: 1956-04-07, 59 y.o.   MRN: [id number]  Chief Complaint  Patient presents with  . Follow-up    HPI Patient is in today for follow-up. Is doing fairly well. Denies any new or acute complaints and cough is improving with multifactorial treatment. No recent illness. Notes some arthritic aches and pains but no new complaints. No redness or warmth, injury. Is tolerating high-dose vitamin D. No recent flare in asthma but is in need of an albuterol refill. Denies CP/palp/SOB/HA/congestion/fevers/GI or GU c/o. Taking meds as prescribed  Past Medical History  Diagnosis Date  . SVT (supraventricular tachycardia) ([place])   . Thyroid disease   . Chicken pox as a child  . Mumps as a child  . Hiatal hernia 2007  . Rectocele 06/12/2014  . 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 RLS (restless legs syndrome)   . Anemia     h/o low ferritin  . Anxiety   . Depression   . Asthma     environmental triggers  . GERD (gastroesophageal reflux disease)   . History of viral illness   . Lactose intolerance 06/12/2014  . Tachycardia 06/12/2014  . Adjustment reaction with anxiety and depression 06/12/2014  . Depression with anxiety 06/12/2014  . Gallstones   . Ehrlichiosis 4132  . Diverticulosis   . Sessile colonic polyp   . Hyperlipidemia, mixed 04/11/2015  . Vitamin D deficiency 08/22/2015  . Cough 08/22/2015    Past Surgical History  Procedure Laterality Date  . Cholecystectomy    . Appendectomy    . Tonsillectomy  1972    2ith adenoid  . Wisdom tooth extraction  59 yrs old  . Abdominal hysterectomy  11-2008    total with cystocele, rectocele repair  . Skin surgery      right arm scc  . Colonoscopy  2006    diverticulosis, hemorrhoids ([place])  . Esophagogastroduodenoscopy  2006    small hiatus hernia ([place])  . Svt ablation    . Incontinence surgery      Family History  Problem Relation Age of Onset   . Hypertension Mother   . Hypertension Father   . Hypertension Sister   . Diabetes Sister     type 2  . Fibromyalgia Sister   . GI Bleed Maternal Grandmother   . Atrial fibrillation Maternal Grandmother   . Heart disease Maternal Grandfather   . Diabetes Paternal Grandmother   . Stroke Paternal Grandfather   . Proteinuria Sister     Social History   Social History  . Marital Status: Divorced    Spouse Name: N/A  . Number of Children: 2  . Years of Education: N/A   Occupational History  . RN [employer]   Social History Main Topics  . Smoking status: Never Smoker   . Smokeless tobacco: Never Used  . Alcohol Use: No  . Drug Use: No  . Sexual Activity: Not on file     Comment: lives by self with 4 dogs works at [employer], avoids dairy   Other Topics Concern  . Not on file   Social History Narrative    Outpatient Prescriptions Prior to Visit  Medication Sig Dispense Refill  . aspirin (ASPIRIN EC) 81 MG EC tablet Take 81 mg by mouth at bedtime.     Marland Kitchen CARTIA XT 120 MG 24 hr capsule TAKE 1 CAPSULE BY MOUTH ONCE  DAILY 90 capsule 3  . cholecalciferol (VITAMIN D) 1000 UNITS tablet Take 1,000 Units by mouth daily.    . clonazePAM (KLONOPIN) 1 MG tablet TAKE 1/2-1 TABLET BY MOUTH 2 TIMES A DAY AS NEEDED FOR ANXIETY 60 tablet 1  . desvenlafaxine (PRISTIQ) 50 MG 24 hr tablet Take 1 tablet (50 mg total) by mouth daily. 30 tablet 3  . diltiazem (CARDIZEM) 60 MG tablet Take 1 tablet (60 mg total) by mouth 2 (two) times daily. 180 tablet 3  . escitalopram (LEXAPRO) 20 MG tablet Take 0.5 tablets (10 mg total) by mouth daily. Please take 1/2 tablet daily. 30 tablet 2  . ranitidine (ZANTAC) 300 MG tablet TAKE 1 TABLET (300 MG TOTAL) BY MOUTH AT BEDTIME AS NEEDED FOR HEARTBURN. 30 tablet 6  . temazepam (RESTORIL) 30 MG capsule TAKE 1 CAPSULE BY MOUTH DAILY AT BEDTIME 30 capsule 2  . Vitamin D, Ergocalciferol, (DRISDOL) 50000 UNITS CAPS capsule Take 1 capsule (50,000 Units total) by  mouth every 7 (seven) days. 4 capsule 3  . albuterol (PROVENTIL HFA;VENTOLIN HFA) 108 (90 BASE) MCG/ACT inhaler Inhale 2 puffs into the lungs every 6 (six) hours as needed for wheezing. 1 Inhaler 6  . beclomethasone (QVAR) 80 MCG/ACT inhaler Inhale 1 puff into the lungs as needed (shotness of breath). 1 Inhaler 6  . pantoprazole (PROTONIX) 40 MG tablet TAKE 1 TABLET (40 MG TOTAL) BY MOUTH DAILY. 30 tablet 6   No facility-administered medications prior to visit.    Allergies  Allergen Reactions  . Dilaudid [Hydromorphone Hcl]     Respiratory , cardiac   . Effexor [Venlafaxine] Other (See Comments)    myalgia  . Abilify [Aripiprazole] Palpitations    Review of Systems  Constitutional: Negative for fever and malaise/fatigue.  HENT: Negative for congestion.   Eyes: Negative for discharge.  Respiratory: Negative for shortness of breath.   Cardiovascular: Negative for chest pain, palpitations and leg swelling.  Gastrointestinal: Negative for nausea and abdominal pain.  Genitourinary: Negative for dysuria.  Musculoskeletal: Negative for falls.  Skin: Negative for rash.  Neurological: Negative for loss of consciousness and headaches.  Endo/Heme/Allergies: Negative for environmental allergies.  Psychiatric/Behavioral: Negative for depression. The patient is not nervous/anxious.        Objective:    Physical Exam  Constitutional: She is oriented to person, place, and time. She appears well-developed and well-nourished. No distress.  HENT:  Head: Normocephalic and atraumatic.  Nose: Nose normal.  Eyes: Right eye exhibits no discharge. Left eye exhibits no discharge.  Neck: Normal range of motion. Neck supple.  Cardiovascular: Normal rate and regular rhythm.   No murmur heard. Pulmonary/Chest: Effort normal and breath sounds normal.  Abdominal: Soft. Bowel sounds are normal. There is no tenderness.  Musculoskeletal: She exhibits no edema.  Neurological: She is alert and oriented to  person, place, and time.  Skin: Skin is warm and dry.  Psychiatric: She has a normal mood and affect.  Nursing note and vitals reviewed.   BP 134/86 mmHg  Pulse 89  Temp(Src) 98 F (36.7 C) (Oral)  Ht 5\' 5"  (1.651 m)  Wt 198 lb 6 oz (89.982 kg)  BMI 33.01 kg/m2  SpO2 98% Wt Readings from Last 3 Encounters:  09/21/15 198 lb 6 oz (89.982 kg)  08/03/15 194 lb 8 oz (88.225 kg)  06/15/15 203 lb 2 oz (92.137 kg)     Lab Results  Component Value Date   WBC 7.6 07/28/2015   HGB 14.4 07/28/2015  HCT 42.3 07/28/2015   PLT 341.0 07/28/2015   GLUCOSE 116* 07/28/2015   CHOL 189 07/28/2015   TRIG 227.0* 07/28/2015   HDL 47.20 07/28/2015   LDLDIRECT 96.0 07/28/2015   LDLCALC 100* 04/15/2015   ALT 18 07/28/2015   AST 14 07/28/2015   NA 139 07/28/2015   K 4.2 07/28/2015   CL 104 07/28/2015   CREATININE 0.71 07/28/2015   BUN 17 07/28/2015   CO2 25 07/28/2015   TSH 0.98 07/28/2015   INR 1.0 11/09/2008   HGBA1C 6.0 07/28/2015    Lab Results  Component Value Date   TSH 0.98 07/28/2015   Lab Results  Component Value Date   WBC 7.6 07/28/2015   HGB 14.4 07/28/2015   HCT 42.3 07/28/2015   MCV 83.3 07/28/2015   PLT 341.0 07/28/2015   Lab Results  Component Value Date   NA 139 07/28/2015   K 4.2 07/28/2015   CO2 25 07/28/2015   GLUCOSE 116* 07/28/2015   BUN 17 07/28/2015   CREATININE 0.71 07/28/2015   BILITOT 0.3 07/28/2015   ALKPHOS 102 07/28/2015   AST 14 07/28/2015   ALT 18 07/28/2015   PROT 7.7 07/28/2015   ALBUMIN 4.5 07/28/2015   CALCIUM 9.7 07/28/2015   GFR 90.18 07/28/2015   Lab Results  Component Value Date   CHOL 189 07/28/2015   Lab Results  Component Value Date   HDL 47.20 07/28/2015   Lab Results  Component Value Date   LDLCALC 100* 04/15/2015   Lab Results  Component Value Date   TRIG 227.0* 07/28/2015   Lab Results  Component Value Date   CHOLHDL 4 07/28/2015   Lab Results  Component Value Date   HGBA1C 6.0 07/28/2015         Assessment & Plan:   Problem List Items Addressed This Visit    Weight gain    Encouraged DASH diet, decrease po intake and increase exercise as tolerated. Needs 7-8 hours of sleep nightly. Avoid trans fats, eat small, frequent meals every 4-5 hours with lean proteins, complex carbs and healthy fats. Minimize simple carbs, GMO foods.      Vitamin D deficiency    Tolerating hi dose supplements. Continue to monitor      Hyperlipidemia, mixed    Encouraged heart healthy diet, increase exercise, avoid trans fats, consider a krill oil cap daily      Hyperglycemia - Primary    hgba1c acceptable, minimize simple carbs. Increase exercise as tolerated.      GERD (gastroesophageal reflux disease)    Avoid offending foods, start probiotics. Do not eat large meals in late evening and consider raising head of bed.       Cough    Improving with treatments will refer if worsens once again      Asthma    Refill given on Albuterol      Relevant Medications   albuterol (PROVENTIL HFA;VENTOLIN HFA) 108 (90 BASE) MCG/ACT inhaler      I have discontinued Ms. Twitty's beclomethasone and pantoprazole. I am also having her start on Lorcaserin HCl. Additionally, I am having her maintain her aspirin, cholecalciferol, ranitidine, temazepam, desvenlafaxine, escitalopram, CARTIA XT, Vitamin D (Ergocalciferol), clonazePAM, diltiazem, and albuterol.  Meds ordered this encounter  Medications  . DISCONTD: beclomethasone (QVAR) 80 MCG/ACT inhaler    Sig: Inhale 1 puff into the lungs as needed (shotness of breath).    Dispense:  1 Inhaler    Refill:  6  . albuterol (PROVENTIL HFA;VENTOLIN HFA)  108 (90 BASE) MCG/ACT inhaler    Sig: Inhale 2 puffs into the lungs every 6 (six) hours as needed for wheezing.    Dispense:  1 Inhaler    Refill:  6  . Lorcaserin HCl (BELVIQ) 10 MG TABS    Sig: Take 10 mg by mouth 2 (two) times daily.    Dispense:  60 tablet    Refill:  2     Penni Homans, MD

## 2015-10-06 ENCOUNTER — Other Ambulatory Visit: Payer: Self-pay | Admitting: Family Medicine

## 2015-10-07 NOTE — Telephone Encounter (Signed)
Faxed hardcopy for Temazepam to [facility]. [place]

## 2015-10-20 ENCOUNTER — Other Ambulatory Visit: Payer: Self-pay | Admitting: Family Medicine

## 2015-10-21 NOTE — Telephone Encounter (Signed)
Faxed hardcopy for clonazepam to [facility].

## 2015-11-10 ENCOUNTER — Other Ambulatory Visit: Payer: Self-pay | Admitting: Family Medicine

## 2015-11-23 ENCOUNTER — Ambulatory Visit: Payer: 59 | Admitting: Family Medicine

## 2015-11-24 ENCOUNTER — Ambulatory Visit (INDEPENDENT_AMBULATORY_CARE_PROVIDER_SITE_OTHER): Payer: 59 | Admitting: Medical

## 2015-11-24 ENCOUNTER — Encounter: Payer: Self-pay | Admitting: Medical

## 2015-11-24 ENCOUNTER — Ambulatory Visit (HOSPITAL_BASED_OUTPATIENT_CLINIC_OR_DEPARTMENT_OTHER)
Admission: RE | Admit: 2015-11-24 | Discharge: 2015-11-24 | Disposition: A | Discharge status: Home / Self Care | Payer: 59 | Source: Ambulatory Visit | Attending: Medical | Admitting: Medical

## 2015-11-24 VITALS — BP 124/80 | HR 82 | Temp 98.0°F | Ht 65.0 in | Wt 193.4 lb

## 2015-11-24 DIAGNOSIS — R252 Cramp and spasm: Secondary | ICD-10-CM | POA: Insufficient documentation

## 2015-11-24 DIAGNOSIS — F411 Generalized anxiety disorder: Secondary | ICD-10-CM

## 2015-11-24 DIAGNOSIS — M79605 Pain in left leg: Secondary | ICD-10-CM | POA: Insufficient documentation

## 2015-11-24 DIAGNOSIS — R7989 Other specified abnormal findings of blood chemistry: Secondary | ICD-10-CM | POA: Diagnosis not present

## 2015-11-24 MED ORDER — ZOLPIDEM TARTRATE 5 MG PO TABS: 5.0000 mg | ORAL_TABLET | Freq: Every evening | ORAL | Status: DC | PRN

## 2015-11-24 NOTE — Progress Notes (Signed)
Subjective:    Patient ID: [name] Jensen, female    DOB: 1956/01/24, 59 y.o.   MRN: [id number]  HPI  Pt in states pt appointment in December was canceled then. Then her appointment was again canceled in January. Then she thought she was going to see Lenna Sciara but that was cancelled.  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 Concern about upcoming surgery for her dogs toe. Concern for mets to the lungs of her dog.   Pt has history of use of restoril at night 30 mg q ha for restless leg.  She states she has been using clonezepam 1mg  twice daily. She is still anxious but what she wants is ability to sleep. Since getting new about her dog she can't sleep.  Also belvique is not working. She is not losing weight.  Lt calf soreness and cramping for one week.  Pt  left calf is cramping for last 5 days on and off.  Faint soreness of calf. No shortness of breath. No chest pain.    Review of Systems  Constitutional: Negative for fever, chills and fatigue.  Respiratory: Negative for cough, chest tightness, shortness of breath and wheezing.   Cardiovascular: Negative for chest pain and palpitations.  Gastrointestinal: Negative for abdominal pain.  Musculoskeletal:       Left calf cramp.  Neurological: Negative for dizziness, facial asymmetry, light-headedness and numbness.  Hematological: Negative for adenopathy. Does not bruise/bleed easily.  Psychiatric/Behavioral: Positive for sleep disturbance. Negative for suicidal ideas, behavioral problems, confusion, self-injury and dysphoric mood. The patient is nervous/anxious.      Past Medical History  Diagnosis Date  . SVT (supraventricular tachycardia) ([place])   . Thyroid disease   . Chicken pox as a child  . Mumps as a child  . Hiatal hernia 2007  . Rectocele 06/12/2014  . 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 RLS (restless legs syndrome)   . Anemia     h/o low  ferritin  . Anxiety   . Depression   . Asthma     environmental triggers  . GERD (gastroesophageal reflux disease)   . History of viral illness   . Lactose intolerance 06/12/2014  . Tachycardia 06/12/2014  . Adjustment reaction with anxiety and depression 06/12/2014  . Depression with anxiety 06/12/2014  . Gallstones   . Ehrlichiosis [id number]  . Diverticulosis   . Sessile colonic polyp   . Hyperlipidemia, mixed 04/11/2015  . Vitamin D deficiency 08/22/2015  . Cough 08/22/2015    Social History   Social History  . Marital Status: Divorced    Spouse Name: N/A  . Number of Children: 2  . Years of Education: N/A   Occupational History  . RN [employer]   Social History Main Topics  . Smoking status: Never Smoker   . Smokeless tobacco: Never Used  . Alcohol Use: No  . Drug Use: No  . Sexual Activity: Not on file     Comment: lives by self with 4 dogs works at [employer], avoids dairy   Other Topics Concern  . Not on file   Social History Narrative    Past Surgical History  Procedure Laterality Date  . Cholecystectomy    . Appendectomy    . Tonsillectomy  1972    2ith adenoid  . Wisdom tooth extraction  59 yrs old  . Abdominal hysterectomy  11-2008    total with  cystocele, rectocele repair  . Skin surgery      right arm scc  . Colonoscopy  2006    diverticulosis, hemorrhoids ([place])  . Esophagogastroduodenoscopy  2006    small hiatus hernia ([place])  . Svt ablation    . Incontinence surgery      Family History  Problem Relation Age of Onset  . Hypertension Mother   . Hypertension Father   . Hypertension Sister   . Diabetes Sister     type 2  . Fibromyalgia Sister   . GI Bleed Maternal Grandmother   . Atrial fibrillation Maternal Grandmother   . Heart disease Maternal Grandfather   . Diabetes Paternal Grandmother   . Stroke Paternal Grandfather   . Proteinuria Sister     Allergies  Allergen Reactions  . Dilaudid [Hydromorphone Hcl]     Respiratory ,  cardiac   . Effexor [Venlafaxine] Other (See Comments)    myalgia  . Abilify [Aripiprazole] Palpitations    Current Outpatient Prescriptions on File Prior to Visit  Medication Sig Dispense Refill  . albuterol (PROVENTIL HFA;VENTOLIN HFA) 108 (90 BASE) MCG/ACT inhaler Inhale 2 puffs into the lungs every 6 (six) hours as needed for wheezing. 1 Inhaler 6  . aspirin (ASPIRIN EC) 81 MG EC tablet Take 81 mg by mouth at bedtime.     . beclomethasone (QVAR) 80 MCG/ACT inhaler Inhale 1 puff into the lungs daily. 1 Inhaler 6  . CARTIA XT 120 MG 24 hr capsule TAKE 1 CAPSULE BY MOUTH ONCE DAILY 90 capsule 3  . cholecalciferol (VITAMIN D) 1000 UNITS tablet Take 1,000 Units by mouth daily.    . clonazePAM (KLONOPIN) 1 MG tablet TAKE 1/2-1 TABLET BY MOUTH 2 TIMES DAILY AS NEEDED FOR ANXIETY 60 tablet 1  . diltiazem (CARDIZEM) 60 MG tablet Take 1 tablet (60 mg total) by mouth 2 (two) times daily. 180 tablet 3  . escitalopram (LEXAPRO) 20 MG tablet Take 0.5 tablets (10 mg total) by mouth daily. Please take 1/2 tablet daily. 30 tablet 2  . Lorcaserin HCl (BELVIQ) 10 MG TABS Take 10 mg by mouth 2 (two) times daily. 60 tablet 2  . PRISTIQ 50 MG 24 hr tablet TAKE 1 TABLET BY MOUTH DAILY. 30 tablet 11  . ranitidine (ZANTAC) 300 MG tablet TAKE 1 TABLET (300 MG TOTAL) BY MOUTH AT BEDTIME AS NEEDED FOR HEARTBURN. 30 tablet 6  . temazepam (RESTORIL) 30 MG capsule TAKE 1 CAPSULE BY MOUTH NIGHTLY AT BEDTIME 30 capsule 2  . Vitamin D, Ergocalciferol, (DRISDOL) 50000 UNITS CAPS capsule Take 1 capsule (50,000 Units total) by mouth every 7 (seven) days. 4 capsule 3   No current facility-administered medications on file prior to visit.    BP 124/80 mmHg  Pulse 82  Temp(Src) 98 F (36.7 C) (Oral)  Ht 5\' 5"  (1.651 m)  Wt 193 lb 6.4 oz (87.726 kg)  BMI 32.18 kg/m2  SpO2 98%       Objective:   Physical Exam  General Mental Status- Alert. General Appearance- tear and anxious when desribes her dogs health. After  talking with her she seems to lighten up and become more optimistic.  Skin General: Color- Normal Color. Moisture- Normal Moisture.  Neck Carotid Arteries- Normal color. Moisture- Normal Moisture. No carotid bruits. No JVD.  Chest and Lung Exam Auscultation: Breath Sounds:-Normal. CTA.  Cardiovascular Auscultation:Rythm- Regular,Rate and Rhythm. Murmurs & Other Heart Sounds:Auscultation of the heart reveals- No Murmurs.  Abdomen Inspection:-Inspeection Normal. Palpation/Percussion:Note:No mass. Palpation and Percussion of  the abdomen reveal- Non Tender, Non Distended + BS, no rebound or guarding.   Neurologic Cranial Nerve exam:- CN III-XII intact(No nystagmus), symmetric smile. Strength:- 5/5 equal and symmetric strength both upper and lower extremities.  Left lower ext- calf mild sore negative.       Assessment & Plan:  For your anxiety and recent stress related to your dog serious diagnosis, I would recommend continuing your restoril and clonazepam as you have been.   For insomnia recently we could try ambien but would recommend only the 5 mg dose for 10 nights if needed   For your weight loss would ask you call/leave message with Dr. Charlett Blake for advisement.  Follow up in 2 weeks or as needed.  Will get vitamin d level today due to your hx low vitamin D.

## 2015-11-24 NOTE — Patient Instructions (Addendum)
For your anxiety and recent stress related to your dog serious diagnosis, I would recommend continuing your restoril and clonazepam as you have been.   For insomnia recently we could try ambien but would recommend only the 5 mg dose for 10 nights if needed   For your weight loss would ask you call/leave message with Dr. Charlett Blake for advisement.(do not want to rx any other type med for weight loss as this might stimulate your nervous system and make you more anxious)  Follow up in 2 weeks or as needed.  Will get vitamin d level today due to your hx low vitamin D.  For leg cramping cmp and mg level.  For lower ext pain will get lower ext doppler.

## 2015-11-24 NOTE — Progress Notes (Signed)
Pre visit review using our clinic review tool, if applicable. No additional management support is needed unless otherwise documented below in the visit note. 

## 2015-11-25 ENCOUNTER — Encounter: Payer: Self-pay | Admitting: Medical

## 2015-11-25 LAB — COMPREHENSIVE METABOLIC PANEL
ALT: 24 U/L (ref 0–35)
AST: 20 U/L (ref 0–37)
Albumin: 4.6 g/dL (ref 3.5–5.2)
Alkaline Phosphatase: 86 U/L (ref 39–117)
BUN: 12 mg/dL (ref 6–23)
CALCIUM: 9.8 mg/dL (ref 8.4–10.5)
CO2: 27 meq/L (ref 19–32)
Chloride: 105 mEq/L (ref 96–112)
Creatinine, Ser: 0.69 mg/dL (ref 0.40–1.20)
GFR: 93.1 mL/min (ref >60.00)
Glucose, Bld: 92 mg/dL (ref 70–99)
Potassium: 4.3 mEq/L (ref 3.5–5.1)
Sodium: 141 mEq/L (ref 135–145)
Total Bilirubin: 0.6 mg/dL (ref 0.2–1.2)
Total Protein: 7.8 g/dL (ref 6.0–8.3)

## 2015-11-25 LAB — VITAMIN D 25 HYDROXY (VIT D DEFICIENCY, FRACTURES): VITD: 40.57 ng/mL (ref 30.00–100.00)

## 2015-11-25 LAB — MAGNESIUM: MAGNESIUM: 2.3 mg/dL (ref 1.5–2.5)

## 2015-12-14 MED FILL — BELVIQ 10 MG TABLET: 10 | 30 days supply | Qty: 60 | Fill #2

## 2015-12-14 MED FILL — TEMAZEPAM 30 MG CAPSULE: 30 | 30 days supply | Qty: 30 | Fill #2

## 2015-12-14 MED FILL — raNITIdine HCL 300 MG TABS: 300 | 30 days supply | Qty: 30 | Fill #3

## 2015-12-14 MED FILL — PRISTIQ ER 50 MG TABLET: 50 | 30 days supply | Qty: 30 | Fill #1

## 2015-12-22 ENCOUNTER — Encounter: Payer: 59 | Admitting: Medical

## 2015-12-22 ENCOUNTER — Telehealth: Payer: Self-pay | Admitting: Medical

## 2015-12-22 NOTE — Progress Notes (Signed)
This encounter was created in error - please disregard.

## 2015-12-24 NOTE — Telephone Encounter (Signed)
No charge. 

## 2015-12-24 NOTE — Telephone Encounter (Signed)
Pt was no show for acute appt 12/22/15 10:15am, pt has not rescheduled, charge or no charge?

## 2015-12-27 ENCOUNTER — Other Ambulatory Visit: Payer: Self-pay | Admitting: Family Medicine

## 2015-12-27 MED FILL — clonazePAM 1 MG TABS: 1 | 30 days supply | Qty: 60 | Fill #0

## 2015-12-27 NOTE — Telephone Encounter (Signed)
Faxed hardcopy for Clonazepam to [facility].

## 2015-12-27 NOTE — Telephone Encounter (Signed)
Requesting: clonazepam Contract  none UDS  None Last OV  09/21/2015 Last Refill  #60 with 1 refills on 10/10/2015  Please Advise

## 2015-12-28 ENCOUNTER — Ambulatory Visit: Payer: 59 | Admitting: Family Medicine

## 2015-12-29 ENCOUNTER — Ambulatory Visit: Payer: 59 | Admitting: Family

## 2016-01-12 ENCOUNTER — Telehealth: Payer: Self-pay | Admitting: *Deleted

## 2016-01-12 ENCOUNTER — Ambulatory Visit (INDEPENDENT_AMBULATORY_CARE_PROVIDER_SITE_OTHER): Payer: 59 | Admitting: Podiatry

## 2016-01-12 ENCOUNTER — Encounter: Payer: Self-pay | Admitting: Podiatry

## 2016-01-12 VITALS — BP 124/80 | HR 82 | Resp 16 | Ht 65.0 in | Wt 189.0 lb

## 2016-01-12 DIAGNOSIS — L608 Other nail disorders: Secondary | ICD-10-CM | POA: Diagnosis not present

## 2016-01-12 DIAGNOSIS — B351 Tinea unguium: Secondary | ICD-10-CM

## 2016-01-12 DIAGNOSIS — L600 Ingrowing nail: Secondary | ICD-10-CM

## 2016-01-12 DIAGNOSIS — L819 Disorder of pigmentation, unspecified: Secondary | ICD-10-CM | POA: Diagnosis not present

## 2016-01-12 NOTE — Progress Notes (Signed)
   Subjective:    Patient ID: [name] Jensen, female    DOB: [birth year], 58 y.o.   MRN: [id number]  HPI Patient presents with a nail problem in their Right foot; 5th toe; nail discoloration (black line in nail); Great toe-medial. Pt stated, "hurts to touch nail"; x1.5 mo.   Review of Systems  All other systems reviewed and are negative.      Objective:   Physical Exam        Assessment & Plan:

## 2016-01-12 NOTE — Progress Notes (Signed)
Subjective:     Patient ID: [name] Jensen, female   DOB: January 09, 1956, 60 y.o.   MRN: [id number]  HPI patient presents with 2 problems. I small streak of dark nail on the right fifth nail lateral side and pain in the right big toenail with history of wearing very tight shoes over the holidays   Review of Systems  All other systems reviewed and are negative.      Objective:   Physical Exam  Constitutional: She is oriented to person, place, and time.  Cardiovascular: Intact distal pulses.   Musculoskeletal: Normal range of motion.  Neurological: She is oriented to person, place, and time.  Skin: Skin is warm.  Nursing note and vitals reviewed.  neurovascular status found to be intact with muscle strength adequate range of motion within normal limits with patient noted to have on the right hallux pain on the dorsal surface within the bed itself of the nail with mild redness underneath it but no loosening the nail and on the right fifth nail on the lateral side there is a small dark streak that she's had history of on her other foot with negative biopsy results. Patient's found have good digital perfusion and is well oriented 3     Assessment:      probable trauma but cannot rule out the chances for melanoma of the right fifth nail with traumatized right big nail    Plan:      H&P and both conditions discussed. Debrided the lateral side of the right fifth nail and I sent off for pathology and for the big toenail we'll begin soaks and padding usage and if it were to get worse the nail may need to be removed. Discussed we will let her know immediately get results but I do not expect this to be any kind of a malignant condition

## 2016-01-12 NOTE — Telephone Encounter (Addendum)
Right 5th toenail sent to [facility] r/o melanoma.  01/24/2016-PT CALLED FOR BIOPSY OF TOENAIL RESULTS FOR 01/12/2016.  Left message informing pt the results were not back yet and I would call to see if they were available for reviewing and call once Dr. Paulla Dolly had reviewed.  01/25/2016-I SPOKE WITH TURNER AT [facility] and he stated the biopsy results are final and will fax to [facility]

## 2016-01-18 ENCOUNTER — Other Ambulatory Visit: Payer: Self-pay | Admitting: *Deleted

## 2016-01-18 ENCOUNTER — Encounter: Payer: Self-pay | Admitting: Internal Medicine

## 2016-01-18 ENCOUNTER — Encounter: Payer: Self-pay | Admitting: Physician Assistant

## 2016-01-18 ENCOUNTER — Encounter: Payer: Self-pay | Admitting: Adult Health

## 2016-01-18 ENCOUNTER — Ambulatory Visit (INDEPENDENT_AMBULATORY_CARE_PROVIDER_SITE_OTHER): Payer: 59 | Admitting: Physician Assistant

## 2016-01-18 DIAGNOSIS — M542 Cervicalgia: Secondary | ICD-10-CM | POA: Diagnosis not present

## 2016-01-18 MED ORDER — KETOROLAC TROMETHAMINE 60 MG/2ML IM SOLN
60.0000 mg | Freq: Once | INTRAMUSCULAR | Status: AC
  Administered 2016-01-18: 60 mg via INTRAMUSCULAR

## 2016-01-18 MED ORDER — CYCLOBENZAPRINE HCL 10 MG PO TABS: 10.0000 mg | ORAL_TABLET | Freq: Three times a day (TID) | ORAL | Status: DC | PRN

## 2016-01-18 MED ORDER — METOPROLOL SUCCINATE ER 50 MG PO TB24: ORAL_TABLET | ORAL | Status: DC

## 2016-01-18 MED ORDER — METOPROLOL SUCCINATE ER 100 MG PO TB24: ORAL_TABLET | ORAL | Status: DC

## 2016-01-18 MED ORDER — MELOXICAM 15 MG PO TABS: 15.0000 mg | ORAL_TABLET | Freq: Every day | ORAL | Status: DC

## 2016-01-18 MED ORDER — METOPROLOL SUCCINATE ER 50 MG PO TB24: 50.0000 mg | ORAL_TABLET | Freq: Every day | ORAL | Status: DC

## 2016-01-18 MED ORDER — METOPROLOL SUCCINATE ER 100 MG PO TB24: 100.0000 mg | ORAL_TABLET | Freq: Every day | ORAL | Status: DC

## 2016-01-18 MED FILL — MELOXICAM 15 MG TABLET: 15 | 30 days supply | Qty: 30 | Fill #0

## 2016-01-18 MED FILL — METOPROLOL SUCC ER 50 MG TA: 50 | 90 days supply | Qty: 270 | Fill #0 | Status: TO

## 2016-01-18 MED FILL — CYCLOBENZAPRINE 10 MG TAB: 10 | 10 days supply | Qty: 30 | Fill #0

## 2016-01-18 NOTE — Progress Notes (Signed)
Patient presents to clinic today c/o pain in lumbar and thoracic back after injury sustained on Saturday. Patient endorses cantering on her new horse when the horse suddenly changegcourse and she fell off of the horse onto the grown hitting her upper back and neck. Thankfully was wearing her helmet. Denies loss of consciousness. Endorses feeling ok the rest of the day but started with soreness in upper and mid back the next day. Endorses pain is worse today 2 days later. Denies radiation of pain from the back. Denies numbness or tingling. Endorses a mild headache after the incident but none since. Denies AMS or change in vision. Has taken Ibuprofen with some relief in symptoms..   Past Medical History  Diagnosis Date  . SVT (supraventricular tachycardia) ([place])   . Thyroid disease   . Chicken pox as a child  . Mumps as a child  . Hiatal hernia 2007  . Rectocele 06/12/2014  . 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 RLS (restless legs syndrome)   . Anemia     h/o low ferritin  . Anxiety   . Depression   . Asthma     environmental triggers  . GERD (gastroesophageal reflux disease)   . History of viral illness   . Lactose intolerance 06/12/2014  . Tachycardia 06/12/2014  . Adjustment reaction with anxiety and depression 06/12/2014  . Depression with anxiety 06/12/2014  . Gallstones   . Ehrlichiosis [id number]  . Diverticulosis   . Sessile colonic polyp   . Hyperlipidemia, mixed 04/11/2015  . Vitamin D deficiency 08/22/2015  . Cough 08/22/2015    Current Outpatient Prescriptions on File Prior to Visit  Medication Sig Dispense Refill  . albuterol (PROVENTIL HFA;VENTOLIN HFA) 108 (90 BASE) MCG/ACT inhaler Inhale 2 puffs into the lungs every 6 (six) hours as needed for wheezing. 1 Inhaler 6  . aspirin (ASPIRIN EC) 81 MG EC tablet Take 81 mg by mouth at bedtime.     . beclomethasone (QVAR) 80 MCG/ACT inhaler Inhale 1 puff into the lungs daily. 1 Inhaler 6  .  clonazePAM (KLONOPIN) 1 MG tablet TAKE 1/2 TO 1 TABLET BY MOUTH TWICE DAILY AS NEEDED FOR ANXIETY 60 tablet PRN  . escitalopram (LEXAPRO) 20 MG tablet Take 0.5 tablets (10 mg total) by mouth daily. Please take 1/2 tablet daily. 30 tablet 2  . PRISTIQ 50 MG 24 hr tablet TAKE 1 TABLET BY MOUTH DAILY. 30 tablet 11  . ranitidine (ZANTAC) 300 MG tablet TAKE 1 TABLET (300 MG TOTAL) BY MOUTH AT BEDTIME AS NEEDED FOR HEARTBURN. 30 tablet 6  . temazepam (RESTORIL) 30 MG capsule TAKE 1 CAPSULE BY MOUTH NIGHTLY AT BEDTIME 30 capsule 2   No current facility-administered medications on file prior to visit.    Allergies  Allergen Reactions  . Dilaudid [Hydromorphone Hcl]     Respiratory , cardiac   . Effexor [Venlafaxine] Other (See Comments)    myalgia  . Abilify [Aripiprazole] Palpitations    Family History  Problem Relation Age of Onset  . Hypertension Mother   . Hypertension Father   . Hypertension Sister   . Diabetes Sister     type 2  . Fibromyalgia Sister   . GI Bleed Maternal Grandmother   . Atrial fibrillation Maternal Grandmother   . Heart disease Maternal Grandfather   . Diabetes Paternal Grandmother   . Stroke Paternal Grandfather   . Proteinuria Sister     Social History  Social History  . Marital Status: Divorced    Spouse Name: N/A  . Number of Children: 2  . Years of Education: N/A   Occupational History  . RN [employer]   Social History Main Topics  . Smoking status: Never Smoker   . Smokeless tobacco: Never Used  . Alcohol Use: No  . Drug Use: No  . Sexual Activity: Not Asked     Comment: lives by self with 4 dogs works at [employer], avoids dairy   Other Topics Concern  . None   Social History Narrative   Review of Systems - See HPI.  All other ROS are negative.  BP 132/66 mmHg  Pulse 73  Temp(Src) 97.8 F (36.6 C) (Oral)  Ht 5\' 5"  (1.651 m)  Wt 194 lb 9.6 oz (88.27 kg)  BMI 32.38 kg/m2  SpO2 98%  Physical Exam  Constitutional: She is  oriented to person, place, and time and well-developed, well-nourished, and in no distress.  HENT:  Head: Normocephalic and atraumatic.  Right Ear: External ear normal.  Left Ear: External ear normal.  Nose: Nose normal.  Mouth/Throat: Oropharynx is clear and moist.  Eyes: Conjunctivae are normal. Pupils are equal, round, and reactive to light.  Neck: Normal range of motion. Neck supple.  Cardiovascular: Normal rate, regular rhythm, normal heart sounds and intact distal pulses.   Pulmonary/Chest: Effort normal and breath sounds normal. No respiratory distress. She has no wheezes. She has no rales. She exhibits no tenderness.  Musculoskeletal:       Thoracic back: She exhibits tenderness, pain and spasm. She exhibits no bony tenderness.       Lumbar back: She exhibits tenderness and pain. She exhibits no bony tenderness and no spasm.  Neurological: She is alert and oriented to person, place, and time. She has intact cranial nerves. She displays facial symmetry. She has a normal Cerebellar Exam. Coordination and gait normal. GCS score is 15.  Skin: Skin is warm and dry. No rash noted.  Psychiatric: Affect normal.  Vitals reviewed.   Recent Results (from the past 2160 hour(s))  Vitamin D (25 hydroxy)     Status: None   Collection Time: 11/24/15  3:06 PM  Result Value Ref Range   VITD 40.57 30.00 - 100.00 ng/mL  Comprehensive metabolic panel     Status: None   Collection Time: 11/24/15  3:06 PM  Result Value Ref Range   Sodium 141 135 - 145 mEq/L   Potassium 4.3 3.5 - 5.1 mEq/L   Chloride 105 96 - 112 mEq/L   CO2 27 19 - 32 mEq/L   Glucose, Bld 92 70 - 99 mg/dL   BUN 12 6 - 23 mg/dL   Creatinine, Ser 0.69 0.40 - 1.20 mg/dL   Total Bilirubin 0.6 0.2 - 1.2 mg/dL   Alkaline Phosphatase 86 39 - 117 U/L   AST 20 0 - 37 U/L   ALT 24 0 - 35 U/L   Total Protein 7.8 6.0 - 8.3 g/dL   Albumin 4.6 3.5 - 5.2 g/dL   Calcium 9.8 8.4 - 10.5 mg/dL   GFR 93.10 >60.00 mL/min  Magnesium      Status: None   Collection Time: 11/24/15  3:06 PM  Result Value Ref Range   Magnesium 2.3 1.5 - 2.5 mg/dL    Assessment/Plan: Fall from horse 3 days prior. Thankfully patient was wearing protective headgear. No LOC. CN Intact. Neuro exam within normal limits. No bruising noted. There is muscular tenderness  in perispinal musculature of thoracic and lumbar spine with noted spasm. Im Toradol given (60 mg) in office. Will Rx Flexeril to use at bedtime and up to TID when staying at home. Rx mobic once daily. Tylenol for breakthrough pain.

## 2016-01-18 NOTE — Addendum Note (Signed)
Addended by: Harl Bowie on: 01/18/2016 04:49 PM   Modules accepted: Orders

## 2016-01-18 NOTE — Progress Notes (Unsigned)
[name] Jensen has been experiencing rapid heart rates up to 120-130 bpm. She has been taking extra doses of lopressor 50 mg to keep under control, taking TID. This has helped but she has run out. She took diltiazem 240 mg over the weekend and did experience some relief with some bradycardia in the 50's. She states that she took one dose of atenolol 50 mg which she borrowed from her sister which has kept her HR under control. She is requesting increased dose of metoprolol as current dose is not keeping her HR controlled for sustained period of time.  I will increase metoprolol to Metoprolol XL 150 mg daily. She is to follow up with primary cardiologist for ongoing management and response to treatment.

## 2016-01-18 NOTE — Assessment & Plan Note (Signed)
3 days prior. Thankfully patient was wearing protective headgear. No LOC. CN Intact. Neuro exam within normal limits. No bruising noted. There is muscular tenderness in perispinal musculature of thoracic and lumbar spine with noted spasm. Im Toradol given (60 mg) in office. Will Rx Flexeril to use at bedtime and up to TID when staying at home. Rx mobic once daily. Tylenol for breakthrough pain.

## 2016-01-18 NOTE — Patient Instructions (Signed)
Please take Mobic as directed with food. Tylenol for breakthrough pain. Take Flexeril at bedtime nightly. You can take as directed during the day but do not drive while on medication.  Avoid heavy lifting and over exertion. Salon Pas patches or heating pads may also be beneficial. Symptoms will gradually improve and resolve over the next week or so.   Follow-up if anything is not improving.

## 2016-01-18 NOTE — Progress Notes (Signed)
Pre visit review using our clinic review tool, if applicable. No additional management support is needed unless otherwise documented below in the visit note. 

## 2016-01-19 MED FILL — raNITIdine HCL 300 MG TABS: 300 | 30 days supply | Qty: 30 | Fill #4

## 2016-01-19 MED FILL — PRISTIQ ER 50 MG TABLET: 50 | 30 days supply | Qty: 30 | Fill #2

## 2016-01-20 ENCOUNTER — Other Ambulatory Visit: Payer: Self-pay | Admitting: Family Medicine

## 2016-01-20 MED ORDER — TEMAZEPAM 30 MG PO CAPS: ORAL_CAPSULE | ORAL | Status: DC

## 2016-01-20 MED FILL — TEMAZEPAM 30 MG CAPSULE: 30 | 30 days supply | Qty: 30 | Fill #0

## 2016-01-20 NOTE — Telephone Encounter (Signed)
Faxed hardcopy for Temazepam to [facility].

## 2016-01-20 NOTE — Telephone Encounter (Signed)
Requesting:  Temazepam Contract  None UDS  None Last OV  09/21/2015 Last Refill   #30 with 2 refills on 10/06/2015  Please Advise

## 2016-02-01 NOTE — Telephone Encounter (Signed)
Dr. Paulla Dolly reviewed left 5th toenail biopsy 01/12/2016, states pt needs to be evaluated again in 3 months sooner if changes or problems.  I gave pt Dr. Mellody Drown recommendations and results, pt states the specimen for biopsy was of the right 5th toe.  I reviewed our charting and lab order and they were all charted as right 5th toenail.  I called Moses Manners [phone] and informed of the mistake and she states she will correct and fax to [phone].

## 2016-02-08 ENCOUNTER — Ambulatory Visit (INDEPENDENT_AMBULATORY_CARE_PROVIDER_SITE_OTHER): Payer: 59 | Admitting: Family Medicine

## 2016-02-08 ENCOUNTER — Encounter: Payer: Self-pay | Admitting: Family Medicine

## 2016-02-08 VITALS — BP 140/90 | HR 85 | Temp 98.2°F | Ht 65.0 in | Wt 195.1 lb

## 2016-02-08 DIAGNOSIS — R000 Tachycardia, unspecified: Secondary | ICD-10-CM | POA: Diagnosis not present

## 2016-02-08 DIAGNOSIS — E782 Mixed hyperlipidemia: Secondary | ICD-10-CM

## 2016-02-08 DIAGNOSIS — E042 Nontoxic multinodular goiter: Secondary | ICD-10-CM

## 2016-02-08 DIAGNOSIS — K219 Gastro-esophageal reflux disease without esophagitis: Secondary | ICD-10-CM

## 2016-02-08 DIAGNOSIS — D649 Anemia, unspecified: Secondary | ICD-10-CM

## 2016-02-08 DIAGNOSIS — E559 Vitamin D deficiency, unspecified: Secondary | ICD-10-CM | POA: Diagnosis not present

## 2016-02-08 DIAGNOSIS — R739 Hyperglycemia, unspecified: Secondary | ICD-10-CM

## 2016-02-08 MED ORDER — TOPIRAMATE 50 MG PO TABS: 50.0000 mg | ORAL_TABLET | Freq: Every day | ORAL | Status: DC

## 2016-02-08 NOTE — Progress Notes (Signed)
Pre visit review using our clinic review tool, if applicable. No additional management support is needed unless otherwise documented below in the visit note. 

## 2016-02-08 NOTE — Assessment & Plan Note (Signed)
Doing well on the Toprol XL 100 mg in am and 50 mg in pm

## 2016-02-08 NOTE — Patient Instructions (Addendum)
NOW probiotics 10 strain 1-2 caps daily [email or web address]    Post-Concussion Syndrome Post-concussion syndrome describes the symptoms that can occur after a head injury. These symptoms can last from weeks to months. CAUSES  It is not clear why some head injuries cause post-concussion syndrome. It can occur whether your head injury was mild or severe and whether you were wearing head protection or not.  SIGNS AND SYMPTOMS  Memory difficulties.  Dizziness.  Headaches.  Double vision or blurry vision.  Sensitivity to light.  Hearing difficulties.  Depression.  Tiredness.  Weakness.  Difficulty with concentration.  Difficulty sleeping or staying asleep.  Vomiting.  Poor balance or instability on your feet.  Slow reaction time.  Difficulty learning and remembering things you have heard. DIAGNOSIS  There is no test to determine whether you have post-concussion syndrome. Your health care provider may order an imaging scan of your brain, such as a CT scan, to check for other problems that may be causing your symptoms (such as a severe injury inside your skull). TREATMENT  Usually, these problems disappear over time without medical care. Your health care provider may prescribe medicine to help ease your symptoms. It is important to follow up with a neurologist to evaluate your recovery and address any lingering symptoms or issues. HOME CARE INSTRUCTIONS   Take medicines only as directed by your health care provider. Do not take aspirin. Aspirin can slow blood clotting.  Sleep with your head slightly elevated to help with headaches.  Avoid any situation where there is potential for another head injury. This includes football, hockey, soccer, basketball, martial arts, downhill snow sports, and horseback riding. Your condition will get worse every time you experience a concussion. You should avoid these activities until you are evaluated by the appropriate follow-up health care  providers.  Keep all follow-up visits as directed by your health care provider. This is important. SEEK MEDICAL CARE IF:  You have increased problems paying attention or concentrating.  You have increased difficulty remembering or learning new information.  You need more time to complete tasks or assignments than before.  You have increased irritability or decreased ability to cope with stress.  You have more symptoms than before. Seek medical care if you have any of the following symptoms for more than two weeks after your injury:  Lasting (chronic) headaches.  Dizziness or balance problems.  Nausea.  Vision problems.  Increased sensitivity to noise or light.  Depression or mood swings.  Anxiety or irritability.  Memory problems.  Difficulty concentrating or paying attention.  Sleep problems.  Feeling tired all the time. SEEK IMMEDIATE MEDICAL CARE IF:  You have confusion or unusual drowsiness.  Others find it difficult to wake you up.  You have nausea or persistent, forceful vomiting.  You feel like you are moving when you are not (vertigo). Your eyes may move rapidly back and forth.  You have convulsions or faint.  You have severe, persistent headaches that are not relieved by medicine.  You cannot use your arms or legs normally.  One of your pupils is larger than the other.  You have clear or bloody discharge from your nose or ears.  Your problems are getting worse, not better. MAKE SURE YOU:  Understand these instructions.  Will watch your condition.  Will get help right away if you are not doing well or get worse.   This information is not intended to replace advice given to you by your health care provider. Make sure you  discuss any questions you have with your health care provider.   Document Released: 05/12/2002 Document Revised: 12/11/2014 Document Reviewed: 02/25/2014 Elsevier Interactive Patient Education [employer].

## 2016-02-09 ENCOUNTER — Other Ambulatory Visit (INDEPENDENT_AMBULATORY_CARE_PROVIDER_SITE_OTHER): Payer: 59

## 2016-02-09 DIAGNOSIS — E559 Vitamin D deficiency, unspecified: Secondary | ICD-10-CM

## 2016-02-09 DIAGNOSIS — E042 Nontoxic multinodular goiter: Secondary | ICD-10-CM

## 2016-02-09 DIAGNOSIS — E782 Mixed hyperlipidemia: Secondary | ICD-10-CM | POA: Diagnosis not present

## 2016-02-09 DIAGNOSIS — R000 Tachycardia, unspecified: Secondary | ICD-10-CM | POA: Diagnosis not present

## 2016-02-09 DIAGNOSIS — D649 Anemia, unspecified: Secondary | ICD-10-CM

## 2016-02-09 DIAGNOSIS — R739 Hyperglycemia, unspecified: Secondary | ICD-10-CM

## 2016-02-09 LAB — CBC
HEMATOCRIT: 38.5 % (ref 36.0–46.0)
Hemoglobin: 13 g/dL (ref 12.0–15.0)
MCHC: 33.8 g/dL (ref 30.0–36.0)
MCV: 82.9 fl (ref 78.0–100.0)
PLATELETS: 305 10*3/uL (ref 150.0–400.0)
RBC: 4.65 Mil/uL (ref 3.87–5.11)
RDW: 13.1 % (ref 11.5–15.5)
WBC: 8.5 10*3/uL (ref 4.0–10.5)

## 2016-02-09 LAB — COMPREHENSIVE METABOLIC PANEL
ALBUMIN: 4.5 g/dL (ref 3.5–5.2)
ALK PHOS: 84 U/L (ref 39–117)
ALT: 19 U/L (ref 0–35)
AST: 16 U/L (ref 0–37)
BUN: 11 mg/dL (ref 6–23)
CALCIUM: 9.4 mg/dL (ref 8.4–10.5)
CO2: 25 mEq/L (ref 19–32)
CREATININE: 0.72 mg/dL (ref 0.40–1.20)
Chloride: 105 mEq/L (ref 96–112)
GFR: 88.57 mL/min (ref >60.00)
Glucose, Bld: 100 mg/dL — ABNORMAL HIGH (ref 70–99)
Potassium: 4.1 mEq/L (ref 3.5–5.1)
SODIUM: 139 meq/L (ref 135–145)
TOTAL PROTEIN: 7.3 g/dL (ref 6.0–8.3)
Total Bilirubin: 0.3 mg/dL (ref 0.2–1.2)

## 2016-02-09 LAB — LIPID PANEL
CHOLESTEROL: 222 mg/dL — AB (ref 0–200)
HDL: 46 mg/dL (ref >39.00)
LDL Cholesterol: 145 mg/dL — ABNORMAL HIGH (ref 0–99)
NONHDL: 175.94
TRIGLYCERIDES: 156 mg/dL — AB (ref 0.0–149.0)
Total CHOL/HDL Ratio: 5
VLDL: 31.2 mg/dL (ref 0.0–40.0)

## 2016-02-09 LAB — T3, FREE: T3, Free: 4 pg/mL (ref 2.3–4.2)

## 2016-02-09 LAB — HEMOGLOBIN A1C: Hgb A1c MFr Bld: 6.1 % (ref 4.6–6.5)

## 2016-02-09 LAB — TSH: TSH: 2.1 u[IU]/mL (ref 0.35–4.50)

## 2016-02-09 LAB — T4, FREE: Free T4: 0.88 ng/dL (ref 0.60–1.60)

## 2016-02-09 MED FILL — TOPIRAMATE 50 MG TABLET: 50 | 30 days supply | Qty: 30 | Fill #0

## 2016-02-11 MED FILL — clonazePAM 1 MG TABS: 1 | 30 days supply | Qty: 60 | Fill #1

## 2016-02-18 MED FILL — DESVENLAFAXINE ER 50 MG TAB: 50 | 30 days supply | Qty: 30 | Fill #3

## 2016-02-18 MED FILL — TEMAZEPAM 30 MG CAPSULE: 30 | 30 days supply | Qty: 30 | Fill #1

## 2016-02-18 MED FILL — raNITIdine HCL 300 MG TABS: 300 | 30 days supply | Qty: 30 | Fill #5

## 2016-02-20 NOTE — Assessment & Plan Note (Signed)
Encouraged daily supplements ongoing

## 2016-02-20 NOTE — Progress Notes (Signed)
Patient ID: [name] Jensen, female   DOB: [birth year], 58 y.o.   MRN: [id number]   Subjective:    Patient ID: [name] Jensen, female    DOB: November 01, 1956, 60 y.o.   MRN: [id number]  Chief Complaint  Patient presents with  . Follow-up    HPI Patient is in today for follow up. Feeling well today but has had some issues with back pain intermittently. Has responded to Motrin tid to a decent degree denies any falls or trauma. No recent ilness or hospitalization. Denies CP/palp/SOB/HA/congestion/fevers/GI or GU c/o. Taking meds as prescribed  Past Medical History  Diagnosis Date  . SVT (supraventricular tachycardia) ([place])   . Thyroid disease   . Chicken pox as a child  . Mumps as a child  . Hiatal hernia 2007  . Rectocele 06/12/2014  . 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 RLS (restless legs syndrome)   . Anemia     h/o low ferritin  . Anxiety   . Depression   . Asthma     environmental triggers  . GERD (gastroesophageal reflux disease)   . History of viral illness   . Lactose intolerance 06/12/2014  . Tachycardia 06/12/2014  . Adjustment reaction with anxiety and depression 06/12/2014  . Depression with anxiety 06/12/2014  . Gallstones   . Ehrlichiosis [id number]  . Diverticulosis   . Sessile colonic polyp   . Hyperlipidemia, mixed 04/11/2015  . Vitamin D deficiency 08/22/2015  . Cough 08/22/2015    Past Surgical History  Procedure Laterality Date  . Cholecystectomy    . Appendectomy    . Tonsillectomy  1972    2ith adenoid  . Wisdom tooth extraction  60 yrs old  . Abdominal hysterectomy  11-2008    total with cystocele, rectocele repair  . Skin surgery      right arm scc  . Colonoscopy  2006    diverticulosis, hemorrhoids ([facility])  . Esophagogastroduodenoscopy  2006    small hiatus hernia ([facility])  . Svt ablation    . Incontinence surgery      Family History  Problem Relation Age of Onset  . Hypertension Mother   . Hypertension Father    . Hypertension Sister   . Diabetes Sister     type 2  . Fibromyalgia Sister   . GI Bleed Maternal Grandmother   . Atrial fibrillation Maternal Grandmother   . Heart disease Maternal Grandfather   . Diabetes Paternal Grandmother   . Stroke Paternal Grandfather   . Proteinuria Sister     Social History   Social History  . Marital Status: Divorced    Spouse Name: N/A  . Number of Children: 2  . Years of Education: N/A   Occupational History  . RN [employer]   Social History Main Topics  . Smoking status: Never Smoker   . Smokeless tobacco: Never Used  . Alcohol Use: No  . Drug Use: No  . Sexual Activity: Not on file     Comment: lives by self with 4 dogs works at [employer], avoids dairy   Other Topics Concern  . Not on file   Social History Narrative    Outpatient Prescriptions Prior to Visit  Medication Sig Dispense Refill  . albuterol (PROVENTIL HFA;VENTOLIN HFA) 108 (90 BASE) MCG/ACT inhaler Inhale 2 puffs into the lungs every 6 (six) hours as needed for wheezing. 1 Inhaler 6  . aspirin (ASPIRIN EC) 81 MG EC tablet Take  81 mg by mouth at bedtime.     . beclomethasone (QVAR) 80 MCG/ACT inhaler Inhale 1 puff into the lungs daily. 1 Inhaler 6  . Cholecalciferol (VITAMIN D3) 2000 units TABS Take 1 tablet by mouth daily.    . clonazePAM (KLONOPIN) 1 MG tablet TAKE 1/2 TO 1 TABLET BY MOUTH TWICE DAILY AS NEEDED FOR ANXIETY 60 tablet PRN  . metoprolol succinate (TOPROL-XL) 50 MG 24 hr tablet Take 50 mg in Am and Take 100 mg in the PM 270 tablet 3  . Omega-3 Fatty Acids (FISH OIL) 1000 MG CAPS Take 2 capsules by mouth 2 (two) times daily.    Marland Kitchen PRISTIQ 50 MG 24 hr tablet TAKE 1 TABLET BY MOUTH DAILY. 30 tablet 11  . ranitidine (ZANTAC) 300 MG tablet TAKE 1 TABLET (300 MG TOTAL) BY MOUTH AT BEDTIME AS NEEDED FOR HEARTBURN. 30 tablet 6  . temazepam (RESTORIL) 30 MG capsule TAKE 1 CAPSULE BY MOUTH NIGHTLY AT BEDTIME 30 capsule 2  . cyclobenzaprine (FLEXERIL) 10 MG tablet Take  1 tablet (10 mg total) by mouth 3 (three) times daily as needed for muscle spasms. 30 tablet 0  . escitalopram (LEXAPRO) 20 MG tablet Take 0.5 tablets (10 mg total) by mouth daily. Please take 1/2 tablet daily. 30 tablet 2  . meloxicam (MOBIC) 15 MG tablet Take 1 tablet (15 mg total) by mouth daily. 30 tablet 0   No facility-administered medications prior to visit.    Allergies  Allergen Reactions  . Dilaudid [Hydromorphone Hcl]     Respiratory , cardiac   . Effexor [Venlafaxine] Other (See Comments)    myalgia  . Abilify [Aripiprazole] Palpitations    Review of Systems  Constitutional: Negative for fever and malaise/fatigue.  HENT: Negative for congestion.   Eyes: Negative for blurred vision.  Respiratory: Negative for shortness of breath.   Cardiovascular: Negative for chest pain, palpitations and leg swelling.  Gastrointestinal: Negative for nausea, abdominal pain and blood in stool.  Genitourinary: Negative for dysuria and frequency.  Musculoskeletal: Positive for back pain. Negative for falls.  Skin: Negative for rash.  Neurological: Negative for dizziness, loss of consciousness and headaches.  Endo/Heme/Allergies: Negative for environmental allergies.  Psychiatric/Behavioral: Negative for depression. The patient is not nervous/anxious.        Objective:    Physical Exam  Constitutional: She is oriented to person, place, and time. She appears well-developed and well-nourished. No distress.  HENT:  Head: Normocephalic and atraumatic.  Nose: Nose normal.  Eyes: Right eye exhibits no discharge. Left eye exhibits no discharge.  Neck: Normal range of motion. Neck supple.  Cardiovascular: Normal rate and regular rhythm.   No murmur heard. Pulmonary/Chest: Effort normal and breath sounds normal.  Abdominal: Soft. Bowel sounds are normal. There is no tenderness.  Musculoskeletal: She exhibits no edema.  Neurological: She is alert and oriented to person, place, and time.    Skin: Skin is warm and dry.  Psychiatric: She has a normal mood and affect.  Nursing note and vitals reviewed.   BP 140/90 mmHg  Pulse 85  Temp(Src) 98.2 F (36.8 C) (Oral)  Ht 5\' 5"  (1.651 m)  Wt 195 lb 2 oz (88.508 kg)  BMI 32.47 kg/m2  SpO2 99% Wt Readings from Last 3 Encounters:  02/08/16 195 lb 2 oz (88.508 kg)  01/18/16 194 lb 9.6 oz (88.27 kg)  01/12/16 189 lb (85.73 kg)     Lab Results  Component Value Date   WBC 8.5 02/09/2016  HGB 13.0 02/09/2016   HCT 38.5 02/09/2016   PLT 305.0 02/09/2016   GLUCOSE 100* 02/09/2016   CHOL 222* 02/09/2016   TRIG 156.0* 02/09/2016   HDL 46.00 02/09/2016   LDLDIRECT 96.0 07/28/2015   LDLCALC 145* 02/09/2016   ALT 19 02/09/2016   AST 16 02/09/2016   NA 139 02/09/2016   K 4.1 02/09/2016   CL 105 02/09/2016   CREATININE 0.72 02/09/2016   BUN 11 02/09/2016   CO2 25 02/09/2016   TSH 2.10 02/09/2016   INR 1.0 11/09/2008   HGBA1C 6.1 02/09/2016    Lab Results  Component Value Date   TSH 2.10 02/09/2016   Lab Results  Component Value Date   WBC 8.5 02/09/2016   HGB 13.0 02/09/2016   HCT 38.5 02/09/2016   MCV 82.9 02/09/2016   PLT 305.0 02/09/2016   Lab Results  Component Value Date   NA 139 02/09/2016   K 4.1 02/09/2016   CO2 25 02/09/2016   GLUCOSE 100* 02/09/2016   BUN 11 02/09/2016   CREATININE 0.72 02/09/2016   BILITOT 0.3 02/09/2016   ALKPHOS 84 02/09/2016   AST 16 02/09/2016   ALT 19 02/09/2016   PROT 7.3 02/09/2016   ALBUMIN 4.5 02/09/2016   CALCIUM 9.4 02/09/2016   GFR 88.57 02/09/2016   Lab Results  Component Value Date   CHOL 222* 02/09/2016   Lab Results  Component Value Date   HDL 46.00 02/09/2016   Lab Results  Component Value Date   LDLCALC 145* 02/09/2016   Lab Results  Component Value Date   TRIG 156.0* 02/09/2016   Lab Results  Component Value Date   CHOLHDL 5 02/09/2016   Lab Results  Component Value Date   HGBA1C 6.1 02/09/2016       Assessment & Plan:    Problem List Items Addressed This Visit    Anemia   Relevant Medications   topiramate (TOPAMAX) 50 MG tablet   Other Relevant Orders   TSH (Completed)   Hemoglobin A1c (Completed)   Comprehensive metabolic panel (Completed)   Lipid panel (Completed)   T4, free (Completed)   T3, free (Completed)   CBC (Completed)   GERD (gastroesophageal reflux disease)    Avoid offending foods, start probiotics. Do not eat large meals in late evening and consider raising head of bed.       Hyperglycemia    hgba1c acceptable, minimize simple carbs. Increase exercise as tolerated.       Relevant Medications   topiramate (TOPAMAX) 50 MG tablet   Other Relevant Orders   TSH (Completed)   Hemoglobin A1c (Completed)   Comprehensive metabolic panel (Completed)   Lipid panel (Completed)   T4, free (Completed)   T3, free (Completed)   CBC (Completed)   Hyperlipidemia, mixed    Encouraged heart healthy diet, increase exercise, avoid trans fats, consider a krill oil cap daily      Relevant Medications   topiramate (TOPAMAX) 50 MG tablet   Other Relevant Orders   TSH (Completed)   Hemoglobin A1c (Completed)   Comprehensive metabolic panel (Completed)   Lipid panel (Completed)   T4, free (Completed)   T3, free (Completed)   CBC (Completed)   Multinodular goiter (nontoxic) (Chronic)   Relevant Medications   topiramate (TOPAMAX) 50 MG tablet   Other Relevant Orders   TSH (Completed)   Hemoglobin A1c (Completed)   Comprehensive metabolic panel (Completed)   Lipid panel (Completed)   T4, free (Completed)   T3, free (  Completed)   CBC (Completed)   Tachycardia    Doing well on the Toprol XL 100 mg in am and 50 mg in pm       Relevant Medications   topiramate (TOPAMAX) 50 MG tablet   Other Relevant Orders   TSH (Completed)   Hemoglobin A1c (Completed)   Comprehensive metabolic panel (Completed)   Lipid panel (Completed)   T4, free (Completed)   T3, free (Completed)   CBC  (Completed)   Vitamin D deficiency - Primary    Encouraged daily supplements ongoing      Relevant Medications   topiramate (TOPAMAX) 50 MG tablet   Other Relevant Orders   TSH (Completed)   Hemoglobin A1c (Completed)   Comprehensive metabolic panel (Completed)   Lipid panel (Completed)   T4, free (Completed)   T3, free (Completed)   CBC (Completed)      I have discontinued Ms. Pletz's escitalopram, meloxicam, and cyclobenzaprine. I am also having her start on topiramate. Additionally, I am having her maintain her aspirin, ranitidine, albuterol, beclomethasone, PRISTIQ, clonazePAM, metoprolol succinate, Vitamin D3, Fish Oil, and temazepam.  Meds ordered this encounter  Medications  . topiramate (TOPAMAX) 50 MG tablet    Sig: Take 1 tablet (50 mg total) by mouth daily.    Dispense:  30 tablet    Refill:  2     Penni Homans, MD

## 2016-02-20 NOTE — Assessment & Plan Note (Signed)
Encouraged heart healthy diet, increase exercise, avoid trans fats, consider a krill oil cap daily 

## 2016-02-20 NOTE — Assessment & Plan Note (Signed)
hgba1c acceptable, minimize simple carbs. Increase exercise as tolerated.  

## 2016-02-20 NOTE — Assessment & Plan Note (Signed)
Avoid offending foods, start probiotics. Do not eat large meals in late evening and consider raising head of bed.  

## 2016-03-15 MED FILL — clonazePAM 1 MG TABS: 1 | 30 days supply | Qty: 60 | Fill #2

## 2016-03-21 ENCOUNTER — Encounter: Payer: Self-pay | Admitting: Podiatry

## 2016-03-22 MED FILL — raNITIdine HCL 300 MG TABS: 300 | 30 days supply | Qty: 30 | Fill #6

## 2016-03-22 MED FILL — TEMAZEPAM 30 MG CAPSULE: 30 | 30 days supply | Qty: 30 | Fill #2

## 2016-03-22 MED FILL — DESVENLAFAXINE ER 50 MG TAB: 50 | 30 days supply | Qty: 30 | Fill #4

## 2016-04-17 ENCOUNTER — Other Ambulatory Visit: Payer: Self-pay | Admitting: Family Medicine

## 2016-04-17 MED ORDER — TEMAZEPAM 30 MG PO CAPS: ORAL_CAPSULE | ORAL | Status: DC

## 2016-04-17 MED ORDER — RANITIDINE HCL 300 MG PO TABS: ORAL_TABLET | ORAL | Status: DC

## 2016-04-17 MED FILL — DESVENLAFAXINE ER 50 MG TAB: 50 | 30 days supply | Qty: 30 | Fill #5

## 2016-04-17 MED FILL — raNITIdine HCL 300 MG TABS: 300 | 30 days supply | Qty: 30 | Fill #0

## 2016-04-17 MED FILL — METOPROLOL SUCC ER 50 MG TA: 50 | 90 days supply | Qty: 270 | Fill #0

## 2016-04-17 MED FILL — clonazePAM 1 MG TABS: 1 | 30 days supply | Qty: 60 | Fill #3

## 2016-04-17 NOTE — Telephone Encounter (Signed)
Faxed hardcopy to [facility]

## 2016-04-17 NOTE — Addendum Note (Signed)
Addended by: Sharon Seller B on: 04/17/2016 01:10 PM   Modules accepted: Orders

## 2016-04-18 ENCOUNTER — Encounter: Payer: Self-pay | Admitting: Family Medicine

## 2016-04-18 ENCOUNTER — Ambulatory Visit (INDEPENDENT_AMBULATORY_CARE_PROVIDER_SITE_OTHER): Payer: 59 | Admitting: Family Medicine

## 2016-04-18 VITALS — BP 120/76 | HR 78 | Temp 98.6°F | Ht 65.0 in | Wt 207.2 lb

## 2016-04-18 DIAGNOSIS — R739 Hyperglycemia, unspecified: Secondary | ICD-10-CM

## 2016-04-18 DIAGNOSIS — F418 Other specified anxiety disorders: Secondary | ICD-10-CM

## 2016-04-18 DIAGNOSIS — E0500 Thyrotoxicosis with diffuse goiter without thyrotoxic crisis or storm: Secondary | ICD-10-CM

## 2016-04-18 DIAGNOSIS — E559 Vitamin D deficiency, unspecified: Secondary | ICD-10-CM

## 2016-04-18 DIAGNOSIS — R509 Fever, unspecified: Secondary | ICD-10-CM

## 2016-04-18 DIAGNOSIS — R519 Headache, unspecified: Secondary | ICD-10-CM

## 2016-04-18 DIAGNOSIS — G2581 Restless legs syndrome: Secondary | ICD-10-CM

## 2016-04-18 DIAGNOSIS — K219 Gastro-esophageal reflux disease without esophagitis: Secondary | ICD-10-CM

## 2016-04-18 DIAGNOSIS — R51 Headache: Secondary | ICD-10-CM

## 2016-04-18 DIAGNOSIS — R000 Tachycardia, unspecified: Secondary | ICD-10-CM | POA: Diagnosis not present

## 2016-04-18 DIAGNOSIS — R635 Abnormal weight gain: Secondary | ICD-10-CM

## 2016-04-18 DIAGNOSIS — E782 Mixed hyperlipidemia: Secondary | ICD-10-CM

## 2016-04-18 DIAGNOSIS — E063 Autoimmune thyroiditis: Secondary | ICD-10-CM

## 2016-04-18 MED ORDER — RANITIDINE HCL 300 MG PO TABS: ORAL_TABLET | ORAL | Status: DC

## 2016-04-18 MED ORDER — BECLOMETHASONE DIPROPIONATE 80 MCG/ACT IN AERS: 1.0000 | INHALATION_SPRAY | Freq: Every day | RESPIRATORY_TRACT | Status: DC

## 2016-04-18 MED ORDER — PANTOPRAZOLE SODIUM 40 MG PO TBEC: 40.0000 mg | DELAYED_RELEASE_TABLET | Freq: Every day | ORAL | Status: DC

## 2016-04-18 MED ORDER — PHENTERMINE HCL 37.5 MG PO CAPS: 37.5000 mg | ORAL_CAPSULE | ORAL | Status: DC

## 2016-04-18 MED ORDER — ALBUTEROL SULFATE HFA 108 (90 BASE) MCG/ACT IN AERS: 2.0000 | INHALATION_SPRAY | Freq: Four times a day (QID) | RESPIRATORY_TRACT | Status: DC | PRN

## 2016-04-18 MED ORDER — ALPRAZOLAM 0.25 MG PO TABS
0.2500 mg | ORAL_TABLET | Freq: Two times a day (BID) | ORAL | Status: DC | PRN
Start: 2016-04-18 — End: 2016-05-16

## 2016-04-18 NOTE — Assessment & Plan Note (Addendum)
Increase Ranitidine to 300 mg po qhs add a PPI in am. Add NOW probiotics, can use Tums prn avoid offending foods. Check an H Pylori

## 2016-04-18 NOTE — Assessment & Plan Note (Signed)
Will restart Phentermine for a 3 month course. Encouraged DASH diet, decrease po intake and increase exercise as tolerated. Needs 7-8 hours of sleep nightly. Avoid trans fats, eat small, frequent meals every 4-5 hours with lean proteins, complex carbs and healthy fats. Minimize simple carbs

## 2016-04-18 NOTE — Assessment & Plan Note (Signed)
restoril is working well continue the same

## 2016-04-18 NOTE — Assessment & Plan Note (Signed)
Feeling poorly. Will repeat thyroid studies

## 2016-04-18 NOTE — Progress Notes (Signed)
Pre visit review using our clinic review tool, if applicable. No additional management support is needed unless otherwise documented below in the visit note. 

## 2016-04-18 NOTE — Progress Notes (Signed)
Subjective:    Patient ID: [name] Jensen, female    DOB: 09/13/1956, 60 y.o.   MRN: [id number]  Chief Complaint  Patient presents with  . Follow-up    HPI Patient is in today for follow up. She still continues to struggle with anhedonia but no concerning ideation. She is very excited to get her new horse. She is struggling with fatigue and abdominal pain. She also notes worsening dyspepsia and brackish fluid in her throat so bad it causes choking and cough at times. Is noting a slower bowel transit time moving her bowels every 1-2 days down from 3 x daily. No bloody stool. Denies CP/palp/SOB/HA/congestion/fevers or GU c/o. Taking meds as prescribed  Past Medical History  Diagnosis Date  . SVT (supraventricular tachycardia) ([place])   . Thyroid disease   . Chicken pox as a child  . Mumps as a child  . Hiatal hernia 2007  . Rectocele 06/12/2014  . 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 RLS (restless legs syndrome)   . Anemia     h/o low ferritin  . Anxiety   . Depression   . Asthma     environmental triggers  . GERD (gastroesophageal reflux disease)   . History of viral illness   . Lactose intolerance 06/12/2014  . Tachycardia 06/12/2014  . Adjustment reaction with anxiety and depression 06/12/2014  . Depression with anxiety 06/12/2014  . Gallstones   . Ehrlichiosis 2841  . Diverticulosis   . Sessile colonic polyp   . Hyperlipidemia, mixed 04/11/2015  . Vitamin D deficiency 08/22/2015  . Cough 08/22/2015    Past Surgical History  Procedure Laterality Date  . Cholecystectomy    . Appendectomy    . Tonsillectomy  1972    2ith adenoid  . Wisdom tooth extraction  60 yrs old  . Abdominal hysterectomy  11-2008    total with cystocele, rectocele repair  . Skin surgery      right arm scc  . Colonoscopy  2006    diverticulosis, hemorrhoids ([facility])  . Esophagogastroduodenoscopy  2006    small hiatus hernia ([facility])  . Svt ablation    .  Incontinence surgery      Family History  Problem Relation Age of Onset  . Hypertension Mother   . Hypertension Father   . Hypertension Sister   . Diabetes Sister     type 2  . Fibromyalgia Sister   . GI Bleed Maternal Grandmother   . Atrial fibrillation Maternal Grandmother   . Heart disease Maternal Grandfather   . Diabetes Paternal Grandmother   . Stroke Paternal Grandfather   . Proteinuria Sister     Social History   Social History  . Marital Status: Divorced    Spouse Name: N/A  . Number of Children: 2  . Years of Education: N/A   Occupational History  . RN [employer]   Social History Main Topics  . Smoking status: Never Smoker   . Smokeless tobacco: Never Used  . Alcohol Use: No  . Drug Use: No  . Sexual Activity: Not on file     Comment: lives by self with 4 dogs works at [employer], avoids dairy   Other Topics Concern  . Not on file   Social History Narrative    Outpatient Prescriptions Prior to Visit  Medication Sig Dispense Refill  . aspirin (ASPIRIN EC) 81 MG EC tablet Take 81 mg by mouth at bedtime.     Marland Kitchen  Cholecalciferol (VITAMIN D3) 2000 units TABS Take 1 tablet by mouth daily.    . metoprolol succinate (TOPROL-XL) 50 MG 24 hr tablet Take 50 mg in Am and Take 100 mg in the PM 270 tablet 3  . Omega-3 Fatty Acids (FISH OIL) 1000 MG CAPS Take 2 capsules by mouth 2 (two) times daily.    Marland Kitchen PRISTIQ 50 MG 24 hr tablet TAKE 1 TABLET BY MOUTH DAILY. 30 tablet 11  . temazepam (RESTORIL) 30 MG capsule TAKE 1 CAPSULE BY MOUTH NIGHTLY AT BEDTIME 30 capsule 2  . albuterol (PROVENTIL HFA;VENTOLIN HFA) 108 (90 BASE) MCG/ACT inhaler Inhale 2 puffs into the lungs every 6 (six) hours as needed for wheezing. 1 Inhaler 6  . beclomethasone (QVAR) 80 MCG/ACT inhaler Inhale 1 puff into the lungs daily. 1 Inhaler 6  . clonazePAM (KLONOPIN) 1 MG tablet TAKE 1/2 TO 1 TABLET BY MOUTH TWICE DAILY AS NEEDED FOR ANXIETY 60 tablet PRN  . ranitidine (ZANTAC) 300 MG tablet TAKE 1  TABLET (300 MG TOTAL) BY MOUTH AT BEDTIME AS NEEDED FOR HEARTBURN. 30 tablet 6  . topiramate (TOPAMAX) 50 MG tablet Take 1 tablet (50 mg total) by mouth daily. 30 tablet 2   No facility-administered medications prior to visit.    Allergies  Allergen Reactions  . Dilaudid [Hydromorphone Hcl]     Respiratory , cardiac   . Effexor [Venlafaxine] Other (See Comments)    myalgia  . Abilify [Aripiprazole] Palpitations    Review of Systems  Constitutional: Positive for malaise/fatigue. Negative for fever.  HENT: Negative for congestion.   Eyes: Negative for blurred vision.  Respiratory: Positive for cough and shortness of breath.   Cardiovascular: Negative for chest pain, palpitations and leg swelling.  Gastrointestinal: Positive for heartburn and abdominal pain. Negative for nausea and blood in stool.  Genitourinary: Negative for dysuria and frequency.  Musculoskeletal: Negative for falls.  Skin: Negative for rash.  Neurological: Negative for dizziness, loss of consciousness and headaches.  Endo/Heme/Allergies: Negative for environmental allergies.  Psychiatric/Behavioral: Negative for depression. The patient is not nervous/anxious.        Objective:    Physical Exam  Constitutional: She is oriented to person, place, and time. She appears well-developed and well-nourished. No distress.  HENT:  Head: Normocephalic and atraumatic.  Eyes: Conjunctivae are normal.  Neck: Neck supple. No thyromegaly present.  Cardiovascular: Normal rate, regular rhythm and normal heart sounds.   No murmur heard. Pulmonary/Chest: Effort normal and breath sounds normal. No respiratory distress.  Abdominal: Soft. Bowel sounds are normal. She exhibits no distension and no mass. There is no tenderness.  Musculoskeletal: She exhibits no edema.  Lymphadenopathy:    She has no cervical adenopathy.  Neurological: She is alert and oriented to person, place, and time.  Skin: Skin is warm and dry.    Psychiatric: She has a normal mood and affect. Her behavior is normal.    BP 120/76 mmHg  Pulse 78  Temp(Src) 98.6 F (37 C) (Oral)  Ht '5\' 5"'$  (1.651 m)  Wt 207 lb 4 oz (94.008 kg)  BMI 34.49 kg/m2  SpO2 95% Wt Readings from Last 3 Encounters:  04/18/16 207 lb 4 oz (94.008 kg)  02/08/16 195 lb 2 oz (88.508 kg)  01/18/16 194 lb 9.6 oz (88.27 kg)     Lab Results  Component Value Date   WBC 8.5 02/09/2016   HGB 13.0 02/09/2016   HCT 38.5 02/09/2016   PLT 305.0 02/09/2016   GLUCOSE 100* 02/09/2016  CHOL 222* 02/09/2016   TRIG 156.0* 02/09/2016   HDL 46.00 02/09/2016   LDLDIRECT 96.0 07/28/2015   LDLCALC 145* 02/09/2016   ALT 19 02/09/2016   AST 16 02/09/2016   NA 139 02/09/2016   K 4.1 02/09/2016   CL 105 02/09/2016   CREATININE 0.72 02/09/2016   BUN 11 02/09/2016   CO2 25 02/09/2016   TSH 2.10 02/09/2016   INR 1.0 11/09/2008   HGBA1C 6.1 02/09/2016    Lab Results  Component Value Date   TSH 2.10 02/09/2016   Lab Results  Component Value Date   WBC 8.5 02/09/2016   HGB 13.0 02/09/2016   HCT 38.5 02/09/2016   MCV 82.9 02/09/2016   PLT 305.0 02/09/2016   Lab Results  Component Value Date   NA 139 02/09/2016   K 4.1 02/09/2016   CO2 25 02/09/2016   GLUCOSE 100* 02/09/2016   BUN 11 02/09/2016   CREATININE 0.72 02/09/2016   BILITOT 0.3 02/09/2016   ALKPHOS 84 02/09/2016   AST 16 02/09/2016   ALT 19 02/09/2016   PROT 7.3 02/09/2016   ALBUMIN 4.5 02/09/2016   CALCIUM 9.4 02/09/2016   GFR 88.57 02/09/2016   Lab Results  Component Value Date   CHOL 222* 02/09/2016   Lab Results  Component Value Date   HDL 46.00 02/09/2016   Lab Results  Component Value Date   LDLCALC 145* 02/09/2016   Lab Results  Component Value Date   TRIG 156.0* 02/09/2016   Lab Results  Component Value Date   CHOLHDL 5 02/09/2016   Lab Results  Component Value Date   HGBA1C 6.1 02/09/2016       Assessment & Plan:   Problem List Items Addressed This Visit     Weight gain    Will restart Phentermine for a 3 month course. Encouraged DASH diet, decrease po intake and increase exercise as tolerated. Needs 7-8 hours of sleep nightly. Avoid trans fats, eat small, frequent meals every 4-5 hours with lean proteins, complex carbs and healthy fats. Minimize simple carbs      Vitamin D deficiency    Continue supplements      Tachycardia    Well controlled with Toprol, did drink 4 diet cokes today      Relevant Medications   ALPRAZolam (XANAX) 0.25 MG tablet   Other Relevant Orders   TSH   T4, free   C-reactive protein   Lipid panel   Comprehensive metabolic panel   Sed Rate (ESR)   Ehrlichia antibody panel   Hemoglobin A1c   H. pylori antibody, IgG   Thyroid Peroxidase Antibody   RESTLESS LEG SYNDROME - Primary    restoril is working well continue the same      Relevant Medications   ALPRAZolam (XANAX) 0.25 MG tablet   Other Relevant Orders   TSH   T4, free   C-reactive protein   Lipid panel   Comprehensive metabolic panel   Sed Rate (ESR)   Ehrlichia antibody panel   Hemoglobin A1c   H. pylori antibody, IgG   Thyroid Peroxidase Antibody   Hyperlipidemia, mixed    Encouraged heart healthy diet, increase exercise, avoid trans fats, consider a krill oil cap daily      Relevant Medications   ALPRAZolam (XANAX) 0.25 MG tablet   Other Relevant Orders   TSH   T4, free   C-reactive protein   Lipid panel   Comprehensive metabolic panel   Sed Rate (ESR)   Ehrlichia antibody  panel   Hemoglobin A1c   H. pylori antibody, IgG   Thyroid Peroxidase Antibody   Hyperglycemia     minimize simple carbs. Increase exercise as tolerated      Relevant Medications   ALPRAZolam (XANAX) 0.25 MG tablet   Other Relevant Orders   TSH   T4, free   C-reactive protein   Lipid panel   Comprehensive metabolic panel   Sed Rate (ESR)   Ehrlichia antibody panel   Hemoglobin A1c   H. pylori antibody, IgG   Thyroid Peroxidase Antibody   Headache     Encouraged increased hydration, 64 ounces of clear fluids daily. Minimize alcohol and caffeine. Eat small frequent meals with lean proteins and complex carbs. Avoid high and low blood sugars. Get adequate sleep, 7-8 hours a night. Needs exercise daily preferably in the morning.      GERD (gastroesophageal reflux disease)    Increase Ranitidine to 300 mg po qhs add a PPI in am. Add NOW probiotics, can use Tums prn avoid offending foods. Check an H Pylori      Relevant Medications   pantoprazole (PROTONIX) 40 MG tablet   ranitidine (ZANTAC) 300 MG tablet   ALPRAZolam (XANAX) 0.25 MG tablet   Other Relevant Orders   TSH   T4, free   C-reactive protein   Lipid panel   Comprehensive metabolic panel   Sed Rate (ESR)   Ehrlichia antibody panel   Hemoglobin A1c   H. pylori antibody, IgG   Thyroid Peroxidase Antibody   Euthyroid Hashimoto's thyroiditis (Chronic)    Feeling poorly. Will repeat thyroid studies      Depression with anxiety    Tolerating Pristiq patient manages well generally and is very excited about her new horse.       Other Visit Diagnoses    Fever, unspecified        Relevant Medications    ALPRAZolam (XANAX) 0.25 MG tablet    Other Relevant Orders    TSH    T4, free    C-reactive protein    Lipid panel    Comprehensive metabolic panel    Sed Rate (ESR)    Ehrlichia antibody panel    Hemoglobin A1c    H. pylori antibody, IgG    Thyroid Peroxidase Antibody    Graves disease        Relevant Medications    ALPRAZolam (XANAX) 0.25 MG tablet    Other Relevant Orders    TSH    T4, free    C-reactive protein    Lipid panel    Comprehensive metabolic panel    Sed Rate (ESR)    Ehrlichia antibody panel    Hemoglobin A1c    H. pylori antibody, IgG    Thyroid Peroxidase Antibody       I have discontinued Ms. Gallardo's clonazePAM and topiramate. I am also having her start on pantoprazole, phentermine, and ALPRAZolam. Additionally, I am having her  maintain her aspirin, PRISTIQ, metoprolol succinate, Vitamin D3, Fish Oil, temazepam, albuterol, beclomethasone, and ranitidine.  Meds ordered this encounter  Medications  . albuterol (PROVENTIL HFA;VENTOLIN HFA) 108 (90 Base) MCG/ACT inhaler    Sig: Inhale 2 puffs into the lungs every 6 (six) hours as needed for wheezing.    Dispense:  1 Inhaler    Refill:  6  . beclomethasone (QVAR) 80 MCG/ACT inhaler    Sig: Inhale 1 puff into the lungs daily.    Dispense:  1 Inhaler    Refill:  6  . pantoprazole (PROTONIX) 40 MG tablet    Sig: Take 1 tablet (40 mg total) by mouth daily.    Dispense:  30 tablet    Refill:  3  . ranitidine (ZANTAC) 300 MG tablet    Sig: TAKE 1 TABLET (300 MG TOTAL) BY MOUTH AT BEDTIME AS NEEDED FOR HEARTBURN.    Dispense:  30 tablet    Refill:  6  . phentermine 37.5 MG capsule    Sig: Take 1 capsule (37.5 mg total) by mouth every morning.    Dispense:  30 capsule    Refill:  2  . ALPRAZolam (XANAX) 0.25 MG tablet    Sig: Take 1 tablet (0.25 mg total) by mouth 2 (two) times daily as needed for anxiety.    Dispense:  20 tablet    Refill:  0     Penni Homans, MD

## 2016-04-18 NOTE — Assessment & Plan Note (Signed)
Continue supplements

## 2016-04-18 NOTE — Assessment & Plan Note (Addendum)
Well controlled with Toprol, did drink 4 diet cokes today

## 2016-04-18 NOTE — Assessment & Plan Note (Signed)
minimize simple carbs. Increase exercise as tolerated.  

## 2016-04-18 NOTE — Assessment & Plan Note (Signed)
Encouraged heart healthy diet, increase exercise, avoid trans fats, consider a krill oil cap daily 

## 2016-04-18 NOTE — Patient Instructions (Addendum)
NOW company probiotics 10 variety cap daily, can purchase at [facility] or at [email or web address]  Mango, Xylitol, coffee gut  Food Choices for Gastroesophageal Reflux Disease, Adult When you have gastroesophageal reflux disease (GERD), the foods you eat and your eating habits are very important. Choosing the right foods can help ease the discomfort of GERD. WHAT GENERAL GUIDELINES DO I NEED TO FOLLOW?  Choose fruits, vegetables, whole grains, low-fat dairy products, and low-fat meat, fish, and poultry.  Limit fats such as oils, salad dressings, butter, nuts, and avocado.  Keep a food diary to identify foods that cause symptoms.  Avoid foods that cause reflux. These may be different for different people.  Eat frequent small meals instead of three large meals each day.  Eat your meals slowly, in a relaxed setting.  Limit fried foods.  Cook foods using methods other than frying.  Avoid drinking alcohol.  Avoid drinking large amounts of liquids with your meals.  Avoid bending over or lying down until 2-3 hours after eating. WHAT FOODS ARE NOT RECOMMENDED? The following are some foods and drinks that may worsen your symptoms: Vegetables Tomatoes. Tomato juice. Tomato and spaghetti sauce. Chili peppers. Onion and garlic. Horseradish. Fruits Oranges, grapefruit, and lemon (fruit and juice). Meats High-fat meats, fish, and poultry. This includes hot dogs, ribs, ham, sausage, salami, and bacon. Dairy Whole milk and chocolate milk. Sour cream. Cream. Butter. Ice cream. Cream cheese.  Beverages Coffee and tea, with or without caffeine. Carbonated beverages or energy drinks. Condiments Hot sauce. Barbecue sauce.  Sweets/Desserts Chocolate and cocoa. Donuts. Peppermint and spearmint. Fats and Oils High-fat foods, including [place] fries and potato chips. Other Vinegar. Strong spices, such as black pepper, white pepper, red pepper, cayenne, curry powder, cloves, ginger, and chili  powder. The items listed above may not be a complete list of foods and beverages to avoid. Contact your dietitian for more information.   This information is not intended to replace advice given to you by your health care provider. Make sure you discuss any questions you have with your health care provider.   Document Released: 11/20/2005 Document Revised: 12/11/2014 Document Reviewed: 09/24/2013 Elsevier Interactive Patient Education [employer].

## 2016-04-18 NOTE — Assessment & Plan Note (Signed)
Encouraged increased hydration, 64 ounces of clear fluids daily. Minimize alcohol and caffeine. Eat small frequent meals with lean proteins and complex carbs. Avoid high and low blood sugars. Get adequate sleep, 7-8 hours a night. Needs exercise daily preferably in the morning.  

## 2016-04-18 NOTE — Assessment & Plan Note (Signed)
Tolerating Pristiq patient manages well generally and is very excited about her new horse.

## 2016-04-19 MED FILL — TEMAZEPAM 30 MG CAPSULE: 30 | 30 days supply | Qty: 30 | Fill #0

## 2016-04-19 MED FILL — QVAR 80 MCG ORAL INHALER: 80 | 30 days supply | Qty: 9 | Fill #0

## 2016-04-19 MED FILL — PANTOPRAZOLE SOD DR 40 MG T: 40 | 30 days supply | Qty: 30 | Fill #0

## 2016-04-19 MED FILL — VENTOLIN HFA 90 MCG INHALER: 108 (90 BAS | 25 days supply | Qty: 18 | Fill #0

## 2016-04-20 MED FILL — PHENTERMINE 37.5 MG TABLET: 37.5 | 30 days supply | Qty: 30 | Fill #0

## 2016-04-20 MED FILL — ALPRAZolam 0.25 MG TABS: 0.25 | 10 days supply | Qty: 20 | Fill #0

## 2016-04-27 ENCOUNTER — Other Ambulatory Visit (INDEPENDENT_AMBULATORY_CARE_PROVIDER_SITE_OTHER): Payer: 59

## 2016-04-27 ENCOUNTER — Telehealth: Payer: 59 | Admitting: Physician Assistant

## 2016-04-27 DIAGNOSIS — R739 Hyperglycemia, unspecified: Secondary | ICD-10-CM | POA: Diagnosis not present

## 2016-04-27 DIAGNOSIS — E0500 Thyrotoxicosis with diffuse goiter without thyrotoxic crisis or storm: Secondary | ICD-10-CM

## 2016-04-27 DIAGNOSIS — G2581 Restless legs syndrome: Secondary | ICD-10-CM | POA: Diagnosis not present

## 2016-04-27 DIAGNOSIS — R000 Tachycardia, unspecified: Secondary | ICD-10-CM | POA: Diagnosis not present

## 2016-04-27 DIAGNOSIS — J019 Acute sinusitis, unspecified: Secondary | ICD-10-CM | POA: Diagnosis not present

## 2016-04-27 DIAGNOSIS — K219 Gastro-esophageal reflux disease without esophagitis: Secondary | ICD-10-CM | POA: Diagnosis not present

## 2016-04-27 DIAGNOSIS — E782 Mixed hyperlipidemia: Secondary | ICD-10-CM | POA: Diagnosis not present

## 2016-04-27 DIAGNOSIS — R7309 Other abnormal glucose: Secondary | ICD-10-CM | POA: Diagnosis not present

## 2016-04-27 DIAGNOSIS — R509 Fever, unspecified: Secondary | ICD-10-CM | POA: Diagnosis not present

## 2016-04-27 DIAGNOSIS — B9689 Other specified bacterial agents as the cause of diseases classified elsewhere: Secondary | ICD-10-CM

## 2016-04-27 LAB — SEDIMENTATION RATE: SED RATE: 40 mm/h — AB (ref 0–30)

## 2016-04-27 LAB — COMPREHENSIVE METABOLIC PANEL
ALBUMIN: 4.4 g/dL (ref 3.5–5.2)
ALT: 27 U/L (ref 0–35)
AST: 26 U/L (ref 0–37)
Alkaline Phosphatase: 70 U/L (ref 39–117)
BUN: 10 mg/dL (ref 6–23)
CALCIUM: 9.6 mg/dL (ref 8.4–10.5)
CHLORIDE: 104 meq/L (ref 96–112)
CO2: 23 mEq/L (ref 19–32)
CREATININE: 0.89 mg/dL (ref 0.40–1.20)
GFR: 69.3 mL/min (ref >60.00)
Glucose, Bld: 122 mg/dL — ABNORMAL HIGH (ref 70–99)
POTASSIUM: 3.7 meq/L (ref 3.5–5.1)
Sodium: 137 mEq/L (ref 135–145)
Total Bilirubin: 0.5 mg/dL (ref 0.2–1.2)
Total Protein: 7.3 g/dL (ref 6.0–8.3)

## 2016-04-27 LAB — H. PYLORI ANTIBODY, IGG: H PYLORI IGG: NEGATIVE

## 2016-04-27 LAB — LIPID PANEL
CHOLESTEROL: 206 mg/dL — AB (ref 0–200)
HDL: 37.8 mg/dL — AB (ref >39.00)
LDL CALC: 133 mg/dL — AB (ref 0–99)
NonHDL: 167.8
TRIGLYCERIDES: 174 mg/dL — AB (ref 0.0–149.0)
Total CHOL/HDL Ratio: 5
VLDL: 34.8 mg/dL (ref 0.0–40.0)

## 2016-04-27 LAB — TSH: TSH: 1.54 u[IU]/mL (ref 0.35–4.50)

## 2016-04-27 LAB — HEMOGLOBIN A1C: HEMOGLOBIN A1C: 6.3 % (ref 4.6–6.5)

## 2016-04-27 LAB — T4, FREE: FREE T4: 0.85 ng/dL (ref 0.60–1.60)

## 2016-04-27 LAB — C-REACTIVE PROTEIN: CRP: 2.8 mg/dL (ref 0.5–20.0)

## 2016-04-27 MED ORDER — AMOXICILLIN-POT CLAVULANATE 875-125 MG PO TABS: 1.0000 | ORAL_TABLET | Freq: Two times a day (BID) | ORAL | Status: DC

## 2016-04-27 NOTE — Progress Notes (Signed)

## 2016-04-28 LAB — THYROID PEROXIDASE ANTIBODY: Thyroperoxidase Ab SerPl-aCnc: 1 IU/mL (ref <9)

## 2016-05-02 LAB — EHRLICHIA ANTIBODY PANEL

## 2016-05-16 ENCOUNTER — Other Ambulatory Visit: Payer: Self-pay | Admitting: Family Medicine

## 2016-05-16 DIAGNOSIS — E0500 Thyrotoxicosis with diffuse goiter without thyrotoxic crisis or storm: Secondary | ICD-10-CM

## 2016-05-16 DIAGNOSIS — R509 Fever, unspecified: Secondary | ICD-10-CM

## 2016-05-16 DIAGNOSIS — R000 Tachycardia, unspecified: Secondary | ICD-10-CM

## 2016-05-16 DIAGNOSIS — E782 Mixed hyperlipidemia: Secondary | ICD-10-CM

## 2016-05-16 DIAGNOSIS — R739 Hyperglycemia, unspecified: Secondary | ICD-10-CM

## 2016-05-16 DIAGNOSIS — K219 Gastro-esophageal reflux disease without esophagitis: Secondary | ICD-10-CM

## 2016-05-16 DIAGNOSIS — G2581 Restless legs syndrome: Secondary | ICD-10-CM

## 2016-05-16 MED ORDER — ALPRAZOLAM 0.25 MG PO TABS: 0.2500 mg | ORAL_TABLET | Freq: Two times a day (BID) | ORAL | Status: DC | PRN

## 2016-05-16 MED FILL — ALPRAZolam 0.25 MG TABS: 0.25 | 10 days supply | Qty: 20 | Fill #0

## 2016-05-16 MED FILL — raNITIdine HCL 300 MG TABS: 300 | 90 days supply | Qty: 90 | Fill #1

## 2016-05-16 MED FILL — clonazePAM 1 MG TABS: 1 | 30 days supply | Qty: 60 | Fill #4

## 2016-05-16 MED FILL — PANTOPRAZOLE SOD DR 40 MG T: 40 | 90 days supply | Qty: 90 | Fill #1

## 2016-05-16 NOTE — Telephone Encounter (Signed)
Faxed hardcopy to [facility] 

## 2016-05-16 NOTE — Telephone Encounter (Signed)
Last refilla 04/18/2016  #20 with 0 refills and last office visit 04/18/16

## 2016-05-18 ENCOUNTER — Telehealth: Payer: Self-pay | Admitting: Family Medicine

## 2016-05-18 MED FILL — TEMAZEPAM 30 MG CAPSULE: 30 | 30 days supply | Qty: 30 | Fill #1

## 2016-05-18 MED FILL — PHENTERMINE 37.5 MG TABLET: 37.5 | 30 days supply | Qty: 30 | Fill #1

## 2016-05-18 NOTE — Telephone Encounter (Signed)
Pt called in to request potassium scripts to be called in to the pharmacy. 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 She says that her cardiologist suggested that she is dehydrated.   Please advise further. She would like to have Rx called in to :    [place]: [phone]

## 2016-05-19 NOTE — Telephone Encounter (Signed)
Potassium not on medication list. Dr. Charlett Blake out until Monday, May 22, 2016. She will need to schedule an appt w/ PCP or another provider to discuss.

## 2016-06-10 ENCOUNTER — Telehealth (INDEPENDENT_AMBULATORY_CARE_PROVIDER_SITE_OTHER): Payer: Self-pay

## 2016-06-10 ENCOUNTER — Telehealth: Payer: 59 | Admitting: Family

## 2016-06-10 DIAGNOSIS — L089 Local infection of the skin and subcutaneous tissue, unspecified: Secondary | ICD-10-CM

## 2016-06-10 MED ORDER — AMOXICILLIN-POT CLAVULANATE 875-125 MG PO TABS: 1.0000 | ORAL_TABLET | Freq: Two times a day (BID) | ORAL | Status: AC

## 2016-06-10 MED ORDER — FLUCONAZOLE 150 MG PO TABS: 150.0000 mg | ORAL_TABLET | Freq: Once | ORAL | Status: DC

## 2016-06-10 NOTE — Progress Notes (Signed)
E Visit for Rash  We are sorry that you are not feeling well. Here is how we plan to help!  As per our telephone conversation: Cellulitis: I have prescribed Augmentin 875, take 1 tab every 12 hours x 10 days and Diflucan 150mg  once (if needed for yeast infection from antibiotics).  HOME CARE:   Take cool showers and avoid direct sunlight.  Apply cool compress or wet dressings.  Take a bath in an oatmeal bath.  Sprinkle content of one Aveeno packet under running faucet with comfortably warm water.  Bathe for 15-20 minutes, 1-2 times daily.  Pat dry with a towel. Do not rub the rash.  Use hydrocortisone cream.  Take an antihistamine like Benadryl for widespread rashes that itch.  The adult dose of Benadryl is 25-50 mg by mouth 4 times daily.  Caution:  This type of medication may cause sleepiness.  Do not drink alcohol, drive, or operate dangerous machinery while taking antihistamines.  Do not take these medications if you have prostate enlargement.  Read package instructions thoroughly on all medications that you take.  GET HELP RIGHT AWAY IF:   Symptoms don't go away after treatment.  Severe itching that persists.  If you rash spreads or swells.  If you rash begins to smell.  If it blisters and opens or develops a yellow-brown crust.  You develop a fever.  You have a sore throat.  You become short of breath.  MAKE SURE YOU:  Understand these instructions. Will watch your condition. Will get help right away if you are not doing well or get worse.  Thank you for choosing an e-visit. Your e-visit answers were reviewed by a board certified advanced clinical practitioner to complete your personal care plan. Depending upon the condition, your plan could have included both over the counter or prescription medications. Please review your pharmacy choice. Be sure that the pharmacy you have chosen is open so that you can pick up your prescription now.  If there is a problem you  may message your provider in [place] to have the prescription routed to another pharmacy. Your safety is important to [place]. If you have drug allergies check your prescription carefully.  For the next 24 hours, you can use MyChart to ask questions about today's visit, request a non-urgent call back, or ask for a work or school excuse from your e-visit provider. You will get an email in the next two days asking about your experience. I hope that your e-visit has been valuable and will speed your recovery.

## 2016-06-18 ENCOUNTER — Other Ambulatory Visit: Payer: Self-pay | Admitting: Family Medicine

## 2016-06-18 DIAGNOSIS — R509 Fever, unspecified: Secondary | ICD-10-CM

## 2016-06-18 DIAGNOSIS — R000 Tachycardia, unspecified: Secondary | ICD-10-CM

## 2016-06-18 DIAGNOSIS — E782 Mixed hyperlipidemia: Secondary | ICD-10-CM

## 2016-06-18 DIAGNOSIS — G2581 Restless legs syndrome: Secondary | ICD-10-CM

## 2016-06-18 DIAGNOSIS — E0500 Thyrotoxicosis with diffuse goiter without thyrotoxic crisis or storm: Secondary | ICD-10-CM

## 2016-06-18 DIAGNOSIS — K219 Gastro-esophageal reflux disease without esophagitis: Secondary | ICD-10-CM

## 2016-06-18 DIAGNOSIS — R739 Hyperglycemia, unspecified: Secondary | ICD-10-CM

## 2016-06-19 ENCOUNTER — Other Ambulatory Visit: Payer: Self-pay | Admitting: Family Medicine

## 2016-06-19 MED FILL — PHENTERMINE 37.5 MG TABLET: 37.5 | 30 days supply | Qty: 30 | Fill #2

## 2016-06-19 MED FILL — TEMAZEPAM 30 MG CAPSULE: 30 | 30 days supply | Qty: 30 | Fill #2

## 2016-06-19 MED FILL — clonazePAM 1 MG TABS: 1 | 30 days supply | Qty: 60 | Fill #5

## 2016-06-19 NOTE — Telephone Encounter (Signed)
Ok to refill 20 tablets in Dr. Charlett Blake absence. Further refills will have to come from her.

## 2016-06-20 MED ORDER — ALPRAZOLAM 0.25 MG PO TABS: 0.2500 mg | ORAL_TABLET | Freq: Two times a day (BID) | ORAL | Status: DC | PRN

## 2016-06-20 MED FILL — ALPRAZolam 0.25 MG TABS: 0.25 | 10 days supply | Qty: 20 | Fill #0

## 2016-06-20 NOTE — Telephone Encounter (Signed)
Printed and faxed to [facility]

## 2016-06-26 ENCOUNTER — Telehealth: Payer: Self-pay | Admitting: *Deleted

## 2016-06-26 NOTE — Telephone Encounter (Signed)
Pt states she is having pain in the right great toenail area and possible cellulitis.  Pt asked if Dr. Paulla Dolly would perform the ingrown procedure on Wednesday.  I told pt he would unless there were other problems.  I instructed pt to begin epsom salt soaks.

## 2016-06-28 ENCOUNTER — Encounter: Payer: Self-pay | Admitting: Podiatry

## 2016-06-28 ENCOUNTER — Ambulatory Visit (INDEPENDENT_AMBULATORY_CARE_PROVIDER_SITE_OTHER): Payer: 59 | Admitting: Podiatry

## 2016-06-28 VITALS — BP 118/78 | HR 86 | Resp 16

## 2016-06-28 DIAGNOSIS — L600 Ingrowing nail: Secondary | ICD-10-CM | POA: Diagnosis not present

## 2016-06-28 NOTE — Progress Notes (Signed)
Subjective:     Patient ID: [name] Jensen, female   DOB: [birth year], 58 y.o.   MRN: [id number]  HPI patient presents with chronic ingrown toenails bilateral medial lateral borders that a been nothing but pain and she's tried to trim them for years and is becoming increasingly difficult   Review of Systems  All other systems reviewed and are negative.      Objective:   Physical Exam  Constitutional: She is oriented to person, place, and time.  Cardiovascular: Intact distal pulses.   Musculoskeletal: Normal range of motion.  Neurological: She is oriented to person, place, and time.  Skin: Skin is warm.  Nursing note and vitals reviewed.  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 I explained removal of the borders and risk and patient wants procedure understanding risk. Today I infiltrated each hallux 60 mg Xylocaine Marcaine mixture remove the medial lateral borders exposed matrix and applied phenol 3 applications 30 seconds followed by alcohol lavaged each border. Patient will be seen back to recheck

## 2016-06-28 NOTE — Patient Instructions (Signed)

## 2016-07-11 ENCOUNTER — Telehealth: Payer: Self-pay | Admitting: Family Medicine

## 2016-07-11 MED FILL — METOPROLOL SUCC ER 50 MG TA: 50 | 90 days supply | Qty: 270 | Fill #1

## 2016-07-11 NOTE — Telephone Encounter (Signed)
AVS printed/weight highlighted/faxed all to number below.

## 2016-07-11 NOTE — Telephone Encounter (Signed)
°  Relationship to patient: Self  Can be reached: [phone]    Reason for call: Patient request that copy of last AVS be faxed to [phone]. Need last weight

## 2016-07-18 ENCOUNTER — Other Ambulatory Visit: Payer: Self-pay | Admitting: Family Medicine

## 2016-07-18 ENCOUNTER — Telehealth: Payer: Self-pay | Admitting: *Deleted

## 2016-07-18 DIAGNOSIS — E0500 Thyrotoxicosis with diffuse goiter without thyrotoxic crisis or storm: Secondary | ICD-10-CM

## 2016-07-18 DIAGNOSIS — E782 Mixed hyperlipidemia: Secondary | ICD-10-CM

## 2016-07-18 DIAGNOSIS — R509 Fever, unspecified: Secondary | ICD-10-CM

## 2016-07-18 DIAGNOSIS — R739 Hyperglycemia, unspecified: Secondary | ICD-10-CM

## 2016-07-18 DIAGNOSIS — R000 Tachycardia, unspecified: Secondary | ICD-10-CM

## 2016-07-18 DIAGNOSIS — G2581 Restless legs syndrome: Secondary | ICD-10-CM

## 2016-07-18 DIAGNOSIS — K219 Gastro-esophageal reflux disease without esophagitis: Secondary | ICD-10-CM

## 2016-07-18 MED FILL — DESVENLAFAXINE ER 50 MG TAB: 50 | 30 days supply | Qty: 30 | Fill #6

## 2016-07-18 MED FILL — ALPRAZolam 0.25 MG TABS: 0.25 | 10 days supply | Qty: 20 | Fill #0

## 2016-07-18 MED FILL — TEMAZEPAM 30 MG CAPSULE: 30 | 30 days supply | Qty: 30 | Fill #0

## 2016-07-18 MED FILL — PHENTERMINE 37.5 MG TABLET: 37.5 | 30 days supply | Qty: 30 | Fill #0

## 2016-07-18 MED FILL — clonazePAM 1 MG TABS: 1 | 30 days supply | Qty: 60 | Fill #0

## 2016-07-18 NOTE — Telephone Encounter (Signed)
Can have a 30 day supply of all 4 meds and 1 rf til seen

## 2016-07-18 NOTE — Telephone Encounter (Signed)
Printed prescriptions for alprazolam, clonazepam, temazepam and phentermine. Will fax to [facility]

## 2016-07-18 NOTE — Telephone Encounter (Signed)
Last alprazolam refill on 06/20/2016  #20 with 0 refills Last temazepam refill on 04/17/2016  #30 with 2 refills Last phentermine refill 04/17/2016  #30 with 2 refills. No UDS/contract I do not see clonazepam on her list.

## 2016-07-18 NOTE — Telephone Encounter (Signed)
Called patient at [phone] (Home #) to check to see how they were doing from their ingrown toenail procedure that was performed on Wednesday, June 28, 2016. Pt stated, "Feet are doing okay, but still have a small amount of drainage; not in any pain". I told patient that some drainage is normal and that if she were to see a lot of drainage, to please call our office back so we could have it checked out. Pt stated they understood.

## 2016-07-25 ENCOUNTER — Encounter: Payer: Self-pay | Admitting: Family Medicine

## 2016-07-25 ENCOUNTER — Ambulatory Visit (INDEPENDENT_AMBULATORY_CARE_PROVIDER_SITE_OTHER): Payer: 59 | Admitting: Family Medicine

## 2016-07-25 DIAGNOSIS — E782 Mixed hyperlipidemia: Secondary | ICD-10-CM

## 2016-07-25 DIAGNOSIS — E663 Overweight: Secondary | ICD-10-CM

## 2016-07-25 DIAGNOSIS — R739 Hyperglycemia, unspecified: Secondary | ICD-10-CM

## 2016-07-25 MED ORDER — LORAZEPAM 1 MG PO TABS: 0.5000 mg | ORAL_TABLET | Freq: Three times a day (TID) | ORAL | 2 refills | Status: DC | PRN

## 2016-07-25 MED ORDER — PHENTERMINE HCL 37.5 MG PO CAPS: 37.5000 mg | ORAL_CAPSULE | ORAL | 2 refills | Status: DC

## 2016-07-25 NOTE — Progress Notes (Signed)
Pre visit review using our clinic review tool, if applicable. No additional management support is needed unless otherwise documented below in the visit note. 

## 2016-07-25 NOTE — Patient Instructions (Signed)
Generalized Anxiety Disorder Generalized anxiety disorder (GAD) is a mental disorder. It interferes with life functions, including relationships, work, and school. GAD is different from normal anxiety, which everyone experiences at some point in their lives in response to specific life events and activities. Normal anxiety actually helps us prepare for and get through these life events and activities. Normal anxiety goes away after the event or activity is over.  GAD causes anxiety that is not necessarily related to specific events or activities. It also causes excess anxiety in proportion to specific events or activities. The anxiety associated with GAD is also difficult to control. GAD can vary from mild to severe. People with severe GAD can have intense waves of anxiety with physical symptoms (panic attacks).  SYMPTOMS The anxiety and worry associated with GAD are difficult to control. This anxiety and worry are related to many life events and activities and also occur more days than not for 6 months or longer. People with GAD also have three or more of the following symptoms (one or more in children):  Restlessness.   Fatigue.  Difficulty concentrating.   Irritability.  Muscle tension.  Difficulty sleeping or unsatisfying sleep. DIAGNOSIS GAD is diagnosed through an assessment by your health care provider. Your health care provider will ask you questions aboutyour mood,physical symptoms, and events in your life. Your health care provider may ask you about your medical history and use of alcohol or drugs, including prescription medicines. Your health care provider may also do a physical exam and blood tests. Certain medical conditions and the use of certain substances can cause symptoms similar to those associated with GAD. Your health care provider may refer you to a mental health specialist for further evaluation. TREATMENT The following therapies are usually used to treat GAD:    Medication. Antidepressant medication usually is prescribed for long-term daily control. Antianxiety medicines may be added in severe cases, especially when panic attacks occur.   Talk therapy (psychotherapy). Certain types of talk therapy can be helpful in treating GAD by providing support, education, and guidance. A form of talk therapy called cognitive behavioral therapy can teach you healthy ways to think about and react to daily life events and activities.  Stress managementtechniques. These include yoga, meditation, and exercise and can be very helpful when they are practiced regularly. A mental health specialist can help determine which treatment is best for you. Some people see improvement with one therapy. However, other people require a combination of therapies.   This information is not intended to replace advice given to you by your health care provider. Make sure you discuss any questions you have with your health care provider.   Document Released: 03/17/2013 Document Revised: 12/11/2014 Document Reviewed: 03/17/2013 Elsevier Interactive Patient Education 2016 Elsevier Inc.  

## 2016-07-27 MED FILL — LORazepam 1 MG TABS: 1 | 23 days supply | Qty: 70 | Fill #0

## 2016-08-07 ENCOUNTER — Encounter: Payer: Self-pay | Admitting: Family Medicine

## 2016-08-07 NOTE — Progress Notes (Signed)
Patient ID: [name] Jensen, female   DOB: 06-27-1956, 60 y.o.   MRN: [id number]   Subjective:    Patient ID: [name] Jensen, female    DOB: 1956/08/28, 60 y.o.   MRN: [id number]  Chief Complaint  Patient presents with  . Follow-up    HPI Patient is in today for follow up. Doing well on current meds. No acute illness or recent hospitalization. Denies CP/palp/SOB/HA/congestion/fevers/GI or GU c/o. Taking meds as prescribed  Past Medical History:  Diagnosis Date  . Adjustment reaction with anxiety and depression 06/12/2014  . Anemia    h/o low ferritin  . Anxiety   . Asthma    environmental triggers  . Chicken pox as a child  . Cough 08/22/2015  . Depression   . Depression with anxiety 06/12/2014  . Diverticulosis   . Ehrlichiosis 123456  . Gallstones   . GERD (gastroesophageal reflux disease)   . Hiatal hernia 2007  . History of viral illness   . Hyperlipidemia, mixed 04/11/2015  . Lactose intolerance 06/12/2014  . Mumps as a child  . Overweight 08/19/2014  . Rectocele 06/12/2014  . RLS (restless legs syndrome)   . 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 Sessile colonic polyp   . SVT (supraventricular tachycardia) ([place])   . Tachycardia 06/12/2014  . Thyroid disease   . Vitamin D deficiency 08/22/2015    Past Surgical History:  Procedure Laterality Date  . ABDOMINAL HYSTERECTOMY  11-2008   total with cystocele, rectocele repair  . APPENDECTOMY    . CHOLECYSTECTOMY    . COLONOSCOPY  2006   diverticulosis, hemorrhoids ([place])  . ESOPHAGOGASTRODUODENOSCOPY  2006   small hiatus hernia ([place])  . INCONTINENCE SURGERY    . SKIN SURGERY     right arm scc  . svt ablation    . TONSILLECTOMY  1972   2ith adenoid  . WISDOM TOOTH EXTRACTION  60 yrs old    Family History  Problem Relation Age of Onset  . Hypertension Mother   . Hypertension Father   . Hypertension Sister   . Diabetes Sister     type 2  . Fibromyalgia Sister   . GI Bleed  Maternal Grandmother   . Atrial fibrillation Maternal Grandmother   . Heart disease Maternal Grandfather   . Diabetes Paternal Grandmother   . Stroke Paternal Grandfather   . Proteinuria Sister     Social History   Social History  . Marital status: Divorced    Spouse name: N/A  . Number of children: 2  . Years of education: N/A   Occupational History  . RN [employer]   Social History Main Topics  . Smoking status: Never Smoker  . Smokeless tobacco: Never Used  . Alcohol use No  . Drug use: No  . Sexual activity: Not on file     Comment: lives by self with 4 dogs works at [employer], avoids dairy   Other Topics Concern  . Not on file   Social History Narrative  . No narrative on file    Outpatient Medications Prior to Visit  Medication Sig Dispense Refill  . albuterol (PROVENTIL HFA;VENTOLIN HFA) 108 (90 Base) MCG/ACT inhaler Inhale 2 puffs into the lungs every 6 (six) hours as needed for wheezing. 1 Inhaler 6  . ALPRAZolam (XANAX) 0.25 MG tablet TAKE 1 TABLET BY MOUTH TWICE DAILY AS NEEDED FOR ANXIETY 20 tablet 1  . aspirin (ASPIRIN EC) 81 MG EC  tablet Take 81 mg by mouth at bedtime.     . beclomethasone (QVAR) 80 MCG/ACT inhaler Inhale 1 puff into the lungs daily. 1 Inhaler 6  . Cholecalciferol (VITAMIN D3) 2000 units TABS Take 1 tablet by mouth daily.    . metoprolol succinate (TOPROL-XL) 50 MG 24 hr tablet Take 50 mg in Am and Take 100 mg in the PM 270 tablet 3  . Omega-3 Fatty Acids (FISH OIL) 1000 MG CAPS Take 2 capsules by mouth 2 (two) times daily.    . pantoprazole (PROTONIX) 40 MG tablet Take 1 tablet (40 mg total) by mouth daily. 30 tablet 3  . phentermine (ADIPEX-P) 37.5 MG tablet TAKE 1 TABLET BY MOUTH EVERY MORNING 30 tablet 1  . PRISTIQ 50 MG 24 hr tablet TAKE 1 TABLET BY MOUTH DAILY. 30 tablet 11  . ranitidine (ZANTAC) 300 MG tablet TAKE 1 TABLET (300 MG TOTAL) BY MOUTH AT BEDTIME AS NEEDED FOR HEARTBURN. 30 tablet 6  . temazepam (RESTORIL) 30 MG capsule  TAKE 1 CAPSULE BY MOUTH DAILY AT BEDTIME 30 capsule 1  . clonazePAM (KLONOPIN) 1 MG tablet TAKE 1/2 TO 1 TABLET BY MOUTH TWICE DAILY AS NEEDED FOR ANXIETY 60 tablet 1  . clonazePAM (KLONOPIN) 1 MG tablet Take 1/2 to 1 tablet twice daily for as needed for anxiety.    . phentermine 37.5 MG capsule Take 1 capsule (37.5 mg total) by mouth every morning. 30 capsule 2   No facility-administered medications prior to visit.     Allergies  Allergen Reactions  . Dilaudid [Hydromorphone Hcl]     Respiratory , cardiac   . Effexor [Venlafaxine] Other (See Comments)    myalgia  . Abilify [Aripiprazole] Palpitations    Review of Systems  Constitutional: Negative for fever and malaise/fatigue.  HENT: Negative for congestion.   Eyes: Negative for blurred vision.  Respiratory: Negative for shortness of breath.   Cardiovascular: Negative for chest pain, palpitations and leg swelling.  Gastrointestinal: Negative for abdominal pain, blood in stool and nausea.  Genitourinary: Negative for dysuria and frequency.  Musculoskeletal: Negative for falls.  Skin: Negative for rash.  Neurological: Negative for dizziness, loss of consciousness and headaches.  Endo/Heme/Allergies: Negative for environmental allergies.  Psychiatric/Behavioral: Negative for depression. The patient is not nervous/anxious.        Objective:    Physical Exam  Constitutional: She is oriented to person, place, and time. She appears well-developed and well-nourished. No distress.  HENT:  Head: Normocephalic and atraumatic.  Nose: Nose normal.  Eyes: Right eye exhibits no discharge. Left eye exhibits no discharge.  Neck: Normal range of motion. Neck supple.  Cardiovascular: Normal rate and regular rhythm.   No murmur heard. Pulmonary/Chest: Effort normal and breath sounds normal.  Abdominal: Soft. Bowel sounds are normal. There is no tenderness.  Musculoskeletal: She exhibits no edema.  Neurological: She is alert and oriented  to person, place, and time.  Skin: Skin is warm and dry.  Psychiatric: She has a normal mood and affect.  Nursing note and vitals reviewed.   BP 122/80 (BP Location: Left Arm, Patient Position: Sitting, Cuff Size: Normal)   Pulse 75   Temp 97.9 F (36.6 C) (Oral)   Ht 5\' 5"  (1.651 m)   Wt 169 lb 6 oz (76.8 kg)   SpO2 96%   BMI 28.19 kg/m  Wt Readings from Last 3 Encounters:  07/25/16 169 lb 6 oz (76.8 kg)  04/18/16 207 lb 4 oz (94 kg)  02/08/16 195  lb 2 oz (88.5 kg)     Lab Results  Component Value Date   WBC 8.5 02/09/2016   HGB 13.0 02/09/2016   HCT 38.5 02/09/2016   PLT 305.0 02/09/2016   GLUCOSE 122 (H) 04/27/2016   CHOL 206 (H) 04/27/2016   TRIG 174.0 (H) 04/27/2016   HDL 37.80 (L) 04/27/2016   LDLDIRECT 96.0 07/28/2015   LDLCALC 133 (H) 04/27/2016   ALT 27 04/27/2016   AST 26 04/27/2016   NA 137 04/27/2016   K 3.7 04/27/2016   CL 104 04/27/2016   CREATININE 0.89 04/27/2016   BUN 10 04/27/2016   CO2 23 04/27/2016   TSH 1.54 04/27/2016   INR 1.0 11/09/2008   HGBA1C 6.3 04/27/2016    Lab Results  Component Value Date   TSH 1.54 04/27/2016   Lab Results  Component Value Date   WBC 8.5 02/09/2016   HGB 13.0 02/09/2016   HCT 38.5 02/09/2016   MCV 82.9 02/09/2016   PLT 305.0 02/09/2016   Lab Results  Component Value Date   NA 137 04/27/2016   K 3.7 04/27/2016   CO2 23 04/27/2016   GLUCOSE 122 (H) 04/27/2016   BUN 10 04/27/2016   CREATININE 0.89 04/27/2016   BILITOT 0.5 04/27/2016   ALKPHOS 70 04/27/2016   AST 26 04/27/2016   ALT 27 04/27/2016   PROT 7.3 04/27/2016   ALBUMIN 4.4 04/27/2016   CALCIUM 9.6 04/27/2016   GFR 69.30 04/27/2016   Lab Results  Component Value Date   CHOL 206 (H) 04/27/2016   Lab Results  Component Value Date   HDL 37.80 (L) 04/27/2016   Lab Results  Component Value Date   LDLCALC 133 (H) 04/27/2016   Lab Results  Component Value Date   TRIG 174.0 (H) 04/27/2016   Lab Results  Component Value Date    CHOLHDL 5 04/27/2016   Lab Results  Component Value Date   HGBA1C 6.3 04/27/2016       Assessment & Plan:   Problem List Items Addressed This Visit    Overweight    Encouraged DASH diet, decrease po intake and increase exercise as tolerated. Needs 7-8 hours of sleep nightly. Avoid trans fats, eat small, frequent meals every 4-5 hours with lean proteins, complex carbs and healthy fats. Minimize simple carbs      Hyperlipidemia, mixed    Encouraged heart healthy diet, increase exercise, avoid trans fats, consider a krill oil cap daily      Hyperglycemia    minimize simple carbs. Increase exercise as tolerated.        Other Visit Diagnoses   None.     I have discontinued Ms. Placke's clonazePAM and clonazePAM. I am also having her start on LORazepam. Additionally, I am having her maintain her aspirin, PRISTIQ, metoprolol succinate, Vitamin D3, Fish Oil, albuterol, beclomethasone, pantoprazole, ranitidine, ALPRAZolam, phentermine, and temazepam.  Meds ordered this encounter  Medications  . DISCONTD: phentermine 37.5 MG capsule    Sig: Take 1 capsule (37.5 mg total) by mouth every morning.    Dispense:  30 capsule    Refill:  2  . LORazepam (ATIVAN) 1 MG tablet    Sig: Take 0.5-1 tablets (0.5-1 mg total) by mouth every 8 (eight) hours as needed for anxiety.    Dispense:  70 tablet    Refill:  2     Penni Homans, MD

## 2016-08-07 NOTE — Assessment & Plan Note (Signed)
Encouraged DASH diet, decrease po intake and increase exercise as tolerated. Needs 7-8 hours of sleep nightly. Avoid trans fats, eat small, frequent meals every 4-5 hours with lean proteins, complex carbs and healthy fats. Minimize simple carbs 

## 2016-08-07 NOTE — Assessment & Plan Note (Signed)
Encouraged heart healthy diet, increase exercise, avoid trans fats, consider a krill oil cap daily 

## 2016-08-07 NOTE — Assessment & Plan Note (Signed)
minimize simple carbs. Increase exercise as tolerated.  

## 2016-08-21 MED FILL — PHENTERMINE 37.5 MG TABLET: 37.5 | 30 days supply | Qty: 30 | Fill #1

## 2016-08-21 MED FILL — TEMAZEPAM 30 MG CAPSULE: 30 | 30 days supply | Qty: 30 | Fill #1

## 2016-09-12 ENCOUNTER — Other Ambulatory Visit: Payer: Self-pay | Admitting: Family Medicine

## 2016-09-12 ENCOUNTER — Telehealth: Payer: Self-pay | Admitting: Family Medicine

## 2016-09-12 DIAGNOSIS — E2839 Other primary ovarian failure: Secondary | ICD-10-CM

## 2016-09-12 DIAGNOSIS — Z1231 Encounter for screening mammogram for malignant neoplasm of breast: Secondary | ICD-10-CM

## 2016-09-12 NOTE — Telephone Encounter (Signed)
Caller name: Relationship to patient: Self Can be reached: [phone] Pharmacy:  Reason for call: Patient is requesting to have a Bone Density test done and hopes to get it tomorrow. Plse adv

## 2016-09-12 NOTE — Telephone Encounter (Signed)
Patient informed. 

## 2016-09-12 NOTE — Telephone Encounter (Signed)
I have ordered the bone density for tomorrow at [facility]. Not sure who calls whom for this one. If she wants it that quickly please advise her to call radiology herself to try and get on the schedule. Referral is set

## 2016-09-13 ENCOUNTER — Ambulatory Visit (INDEPENDENT_AMBULATORY_CARE_PROVIDER_SITE_OTHER)
Admission: RE | Admit: 2016-09-13 | Discharge: 2016-09-13 | Disposition: A | Discharge status: Home / Self Care | Payer: 59 | Source: Ambulatory Visit | Attending: Family Medicine | Admitting: Family Medicine

## 2016-09-13 ENCOUNTER — Other Ambulatory Visit (HOSPITAL_BASED_OUTPATIENT_CLINIC_OR_DEPARTMENT_OTHER): Payer: 59

## 2016-09-13 DIAGNOSIS — E2839 Other primary ovarian failure: Secondary | ICD-10-CM

## 2016-09-13 MED FILL — LORazepam 1 MG TABS: 1 | 23 days supply | Qty: 70 | Fill #1

## 2016-09-18 ENCOUNTER — Ambulatory Visit (HOSPITAL_COMMUNITY)
Admission: RE | Admit: 2016-09-18 | Discharge: 2016-09-18 | Disposition: A | Discharge status: Home / Self Care | Payer: 59 | Source: Ambulatory Visit | Attending: Family Medicine | Admitting: Family Medicine

## 2016-09-18 DIAGNOSIS — Z1231 Encounter for screening mammogram for malignant neoplasm of breast: Secondary | ICD-10-CM

## 2016-09-20 ENCOUNTER — Encounter: Payer: Self-pay | Admitting: Family Medicine

## 2016-09-25 ENCOUNTER — Other Ambulatory Visit: Payer: Self-pay | Admitting: Family Medicine

## 2016-09-25 MED ORDER — TEMAZEPAM 30 MG PO CAPS: ORAL_CAPSULE | ORAL | 1 refills | Status: DC

## 2016-09-25 MED ORDER — PHENTERMINE HCL 37.5 MG PO TABS: 37.5000 mg | ORAL_TABLET | Freq: Every morning | ORAL | 1 refills | Status: DC

## 2016-09-25 MED FILL — TEMAZEPAM 30 MG CAPSULE: 30 | 30 days supply | Qty: 30 | Fill #0

## 2016-09-25 MED FILL — PHENTERMINE 37.5 MG TABLET: 37.5 | 30 days supply | Qty: 30 | Fill #0

## 2016-09-25 MED FILL — clonazePAM 1 MG TABS: 1 | 30 days supply | Qty: 60 | Fill #1

## 2016-09-25 NOTE — Telephone Encounter (Signed)
Also is requesting Aripiprazole 2 mg (do not see on her list)

## 2016-09-27 DIAGNOSIS — Z79899 Other long term (current) drug therapy: Secondary | ICD-10-CM | POA: Diagnosis not present

## 2016-09-27 DIAGNOSIS — H04123 Dry eye syndrome of bilateral lacrimal glands: Secondary | ICD-10-CM | POA: Diagnosis not present

## 2016-09-27 DIAGNOSIS — D2239 Melanocytic nevi of other parts of face: Secondary | ICD-10-CM | POA: Diagnosis not present

## 2016-09-27 DIAGNOSIS — Z9889 Other specified postprocedural states: Secondary | ICD-10-CM | POA: Diagnosis not present

## 2016-09-27 DIAGNOSIS — L820 Inflamed seborrheic keratosis: Secondary | ICD-10-CM | POA: Diagnosis not present

## 2016-09-27 DIAGNOSIS — Z049 Encounter for examination and observation for unspecified reason: Secondary | ICD-10-CM | POA: Diagnosis not present

## 2016-09-27 DIAGNOSIS — D485 Neoplasm of uncertain behavior of skin: Secondary | ICD-10-CM | POA: Diagnosis not present

## 2016-09-27 DIAGNOSIS — L738 Other specified follicular disorders: Secondary | ICD-10-CM | POA: Diagnosis not present

## 2016-09-27 DIAGNOSIS — H2513 Age-related nuclear cataract, bilateral: Secondary | ICD-10-CM | POA: Diagnosis not present

## 2016-09-27 DIAGNOSIS — Z09 Encounter for follow-up examination after completed treatment for conditions other than malignant neoplasm: Secondary | ICD-10-CM | POA: Diagnosis not present

## 2016-09-27 DIAGNOSIS — B078 Other viral warts: Secondary | ICD-10-CM | POA: Diagnosis not present

## 2016-09-28 ENCOUNTER — Other Ambulatory Visit: Payer: Self-pay

## 2016-10-11 ENCOUNTER — Other Ambulatory Visit: Payer: Self-pay | Admitting: Internal Medicine

## 2016-10-12 ENCOUNTER — Other Ambulatory Visit: Payer: Self-pay | Admitting: Family Medicine

## 2016-10-12 MED ORDER — METOPROLOL SUCCINATE ER 50 MG PO TB24: ORAL_TABLET | ORAL | 3 refills | Status: DC

## 2016-10-12 MED FILL — METOPROLOL SUCC ER 50 MG TA: 50 | 90 days supply | Qty: 270 | Fill #0

## 2016-10-20 MED FILL — LORazepam 1 MG TABS: 1 | 23 days supply | Qty: 70 | Fill #2

## 2016-10-24 MED FILL — TEMAZEPAM 30 MG CAPSULE: 30 | 30 days supply | Qty: 30 | Fill #1

## 2016-10-30 ENCOUNTER — Ambulatory Visit: Payer: 59 | Admitting: Family Medicine

## 2016-10-31 ENCOUNTER — Ambulatory Visit (INDEPENDENT_AMBULATORY_CARE_PROVIDER_SITE_OTHER): Payer: 59 | Admitting: Family Medicine

## 2016-10-31 VITALS — BP 108/70 | HR 95 | Temp 98.5°F | Wt 160.0 lb

## 2016-10-31 DIAGNOSIS — R197 Diarrhea, unspecified: Secondary | ICD-10-CM

## 2016-10-31 DIAGNOSIS — E559 Vitamin D deficiency, unspecified: Secondary | ICD-10-CM

## 2016-10-31 DIAGNOSIS — E042 Nontoxic multinodular goiter: Secondary | ICD-10-CM

## 2016-10-31 DIAGNOSIS — D649 Anemia, unspecified: Secondary | ICD-10-CM | POA: Diagnosis not present

## 2016-10-31 DIAGNOSIS — E782 Mixed hyperlipidemia: Secondary | ICD-10-CM

## 2016-10-31 DIAGNOSIS — R739 Hyperglycemia, unspecified: Secondary | ICD-10-CM | POA: Diagnosis not present

## 2016-10-31 DIAGNOSIS — E663 Overweight: Secondary | ICD-10-CM

## 2016-10-31 DIAGNOSIS — K219 Gastro-esophageal reflux disease without esophagitis: Secondary | ICD-10-CM

## 2016-10-31 MED ORDER — VENLAFAXINE HCL 50 MG PO TABS: 50.0000 mg | ORAL_TABLET | Freq: Two times a day (BID) | ORAL | 2 refills | Status: DC

## 2016-10-31 NOTE — Progress Notes (Signed)
Pre visit review using our clinic review tool, if applicable. No additional management support is needed unless otherwise documented below in the visit note. 

## 2016-10-31 NOTE — Patient Instructions (Signed)

## 2016-11-01 MED FILL — VENLAFAXINE HCL 50 MG TAB: 50 | 30 days supply | Qty: 60 | Fill #0

## 2016-11-09 MED FILL — PHENTERMINE 37.5 MG TABLET: 37.5 | 30 days supply | Qty: 30 | Fill #1

## 2016-11-12 NOTE — Assessment & Plan Note (Signed)
Encouraged DASH diet, decrease po intake and increase exercise as tolerated. Needs 7-8 hours of sleep nightly. Avoid trans fats, eat small, frequent meals every 4-5 hours with lean proteins, complex carbs and healthy fats. Minimize simple carbs 

## 2016-11-12 NOTE — Assessment & Plan Note (Signed)
Improved. Maintain probiotics and fiber supplements

## 2016-11-12 NOTE — Progress Notes (Signed)
Patient ID: [name] Jensen, female   DOB: [birth year], 60 y.o.   MRN: [id number]   Subjective:    Patient ID: [name] Jensen, female    DOB: 03-19-1956, 60 y.o.   MRN: [id number]  Chief Complaint  Patient presents with  . Follow-up    HPI Patient is in today for follow up. For the most part she feels well. No recent illness or acute concerns. No polyuria or polydipsia. She is trying to maintain heart healthy diet and minimize simple carbohydrates. Denies CP/palp/SOB/HA/congestion/fevers/GI or GU c/o. Taking meds as prescribed Past Medical History:  Diagnosis Date  . Adjustment reaction with anxiety and depression 06/12/2014  . Anemia    h/o low ferritin  . Anxiety   . Asthma    environmental triggers  . Chicken pox as a child  . Cough 08/22/2015  . Depression   . Depression with anxiety 06/12/2014  . Diverticulosis   . Ehrlichiosis [id number]  . Gallstones   . GERD (gastroesophageal reflux disease)   . Hiatal hernia 2007  . History of viral illness   . Hyperlipidemia, mixed 04/11/2015  . Lactose intolerance 06/12/2014  . Mumps as a child  . Overweight 08/19/2014  . Rectocele 06/12/2014  . RLS (restless legs syndrome)   . 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 Sessile colonic polyp   . SVT (supraventricular tachycardia) ([place])   . Tachycardia 06/12/2014  . Thyroid disease   . Vitamin D deficiency 08/22/2015    Past Surgical History:  Procedure Laterality Date  . ABDOMINAL HYSTERECTOMY  11-2008   total with cystocele, rectocele repair  . APPENDECTOMY    . CHOLECYSTECTOMY    . COLONOSCOPY  2006   diverticulosis, hemorrhoids ([place])  . ESOPHAGOGASTRODUODENOSCOPY  2006   small hiatus hernia ([place])  . INCONTINENCE SURGERY    . SKIN SURGERY     right arm scc  . svt ablation    . TONSILLECTOMY  1972   2ith adenoid  . WISDOM TOOTH EXTRACTION  60 yrs old    Family History  Problem Relation Age of Onset  . Hypertension Mother   .  Hypertension Father   . Hypertension Sister   . Diabetes Sister     type 2  . Fibromyalgia Sister   . GI Bleed Maternal Grandmother   . Atrial fibrillation Maternal Grandmother   . Heart disease Maternal Grandfather   . Diabetes Paternal Grandmother   . Stroke Paternal Grandfather   . Proteinuria Sister     Social History   Social History  . Marital status: Divorced    Spouse name: N/A  . Number of children: 2  . Years of education: N/A   Occupational History  . RN [employer]   Social History Main Topics  . Smoking status: Never Smoker  . Smokeless tobacco: Never Used  . Alcohol use No  . Drug use: No  . Sexual activity: Not on file     Comment: lives by self with 4 dogs works at [employer], avoids dairy   Other Topics Concern  . Not on file   Social History Narrative  . No narrative on file    Outpatient Medications Prior to Visit  Medication Sig Dispense Refill  . albuterol (PROVENTIL HFA;VENTOLIN HFA) 108 (90 Base) MCG/ACT inhaler Inhale 2 puffs into the lungs every 6 (six) hours as needed for wheezing. 1 Inhaler 6  . ALPRAZolam (XANAX) 0.25 MG tablet TAKE 1 TABLET BY MOUTH  TWICE DAILY AS NEEDED FOR ANXIETY 20 tablet 1  . aspirin (ASPIRIN EC) 81 MG EC tablet Take 81 mg by mouth at bedtime.     . beclomethasone (QVAR) 80 MCG/ACT inhaler Inhale 1 puff into the lungs daily. 1 Inhaler 6  . Cholecalciferol (VITAMIN D3) 2000 units TABS Take 1 tablet by mouth daily.    Marland Kitchen LORazepam (ATIVAN) 1 MG tablet Take 0.5-1 tablets (0.5-1 mg total) by mouth every 8 (eight) hours as needed for anxiety. 70 tablet 2  . metoprolol succinate (TOPROL-XL) 50 MG 24 hr tablet Take 50 mg in Am and Take 100 mg in the PM 270 tablet 3  . Omega-3 Fatty Acids (FISH OIL) 1000 MG CAPS Take 2 capsules by mouth 2 (two) times daily.    . phentermine (ADIPEX-P) 37.5 MG tablet Take 1 tablet (37.5 mg total) by mouth every morning. 30 tablet 1  . temazepam (RESTORIL) 30 MG capsule TAKE 1 CAPSULE BY MOUTH  DAILY AT BEDTIME 30 capsule 1  . PRISTIQ 50 MG 24 hr tablet TAKE 1 TABLET BY MOUTH DAILY. 30 tablet 11  . pantoprazole (PROTONIX) 40 MG tablet Take 1 tablet (40 mg total) by mouth daily. 30 tablet 3  . ranitidine (ZANTAC) 300 MG tablet TAKE 1 TABLET (300 MG TOTAL) BY MOUTH AT BEDTIME AS NEEDED FOR HEARTBURN. 30 tablet 6   No facility-administered medications prior to visit.     Allergies  Allergen Reactions  . Dilaudid [Hydromorphone Hcl]     Respiratory , cardiac   . Effexor [Venlafaxine] Other (See Comments)    myalgia  . Abilify [Aripiprazole] Palpitations    Review of Systems  Constitutional: Negative for fever and malaise/fatigue.  HENT: Negative for congestion.   Eyes: Negative for blurred vision.  Respiratory: Negative for shortness of breath.   Cardiovascular: Negative for chest pain, palpitations and leg swelling.  Gastrointestinal: Negative for abdominal pain, blood in stool and nausea.  Genitourinary: Negative for dysuria and frequency.  Musculoskeletal: Negative for falls.  Skin: Negative for rash.  Neurological: Negative for dizziness, loss of consciousness and headaches.  Endo/Heme/Allergies: Negative for environmental allergies.  Psychiatric/Behavioral: Negative for depression. The patient is not nervous/anxious.        Objective:    Physical Exam  Constitutional: She is oriented to person, place, and time. She appears well-developed and well-nourished. No distress.  HENT:  Head: Normocephalic and atraumatic.  Nose: Nose normal.  Eyes: Right eye exhibits no discharge. Left eye exhibits no discharge.  Neck: Normal range of motion. Neck supple.  Cardiovascular: Normal rate and regular rhythm.   No murmur heard. Pulmonary/Chest: Effort normal and breath sounds normal.  Abdominal: Soft. Bowel sounds are normal. There is no tenderness.  Musculoskeletal: She exhibits no edema.  Neurological: She is alert and oriented to person, place, and time.  Skin: Skin is  warm and dry.  Psychiatric: She has a normal mood and affect.  Nursing note and vitals reviewed.   BP 108/70 (BP Location: Left Arm, Patient Position: Sitting, Cuff Size: Normal)   Pulse 95   Temp 98.5 F (36.9 C) (Oral)   Wt 160 lb (72.6 kg)   SpO2 96%   BMI 26.63 kg/m  Wt Readings from Last 3 Encounters:  10/31/16 160 lb (72.6 kg)  07/25/16 169 lb 6 oz (76.8 kg)  04/18/16 207 lb 4 oz (94 kg)     Lab Results  Component Value Date   WBC 8.5 02/09/2016   HGB 13.0 02/09/2016   HCT  38.5 02/09/2016   PLT 305.0 02/09/2016   GLUCOSE 122 (H) 04/27/2016   CHOL 206 (H) 04/27/2016   TRIG 174.0 (H) 04/27/2016   HDL 37.80 (L) 04/27/2016   LDLDIRECT 96.0 07/28/2015   LDLCALC 133 (H) 04/27/2016   ALT 27 04/27/2016   AST 26 04/27/2016   NA 137 04/27/2016   K 3.7 04/27/2016   CL 104 04/27/2016   CREATININE 0.89 04/27/2016   BUN 10 04/27/2016   CO2 23 04/27/2016   TSH 1.54 04/27/2016   INR 1.0 11/09/2008   HGBA1C 6.3 04/27/2016    Lab Results  Component Value Date   TSH 1.54 04/27/2016   Lab Results  Component Value Date   WBC 8.5 02/09/2016   HGB 13.0 02/09/2016   HCT 38.5 02/09/2016   MCV 82.9 02/09/2016   PLT 305.0 02/09/2016   Lab Results  Component Value Date   NA 137 04/27/2016   K 3.7 04/27/2016   CO2 23 04/27/2016   GLUCOSE 122 (H) 04/27/2016   BUN 10 04/27/2016   CREATININE 0.89 04/27/2016   BILITOT 0.5 04/27/2016   ALKPHOS 70 04/27/2016   AST 26 04/27/2016   ALT 27 04/27/2016   PROT 7.3 04/27/2016   ALBUMIN 4.4 04/27/2016   CALCIUM 9.6 04/27/2016   GFR 69.30 04/27/2016   Lab Results  Component Value Date   CHOL 206 (H) 04/27/2016   Lab Results  Component Value Date   HDL 37.80 (L) 04/27/2016   Lab Results  Component Value Date   LDLCALC 133 (H) 04/27/2016   Lab Results  Component Value Date   TRIG 174.0 (H) 04/27/2016   Lab Results  Component Value Date   CHOLHDL 5 04/27/2016   Lab Results  Component Value Date   HGBA1C 6.3  04/27/2016       Assessment & Plan:   Problem List Items Addressed This Visit    Multinodular goiter (nontoxic) (Chronic)   Relevant Orders   VITAMIN D 25 Hydroxy (Vit-D Deficiency, Fractures)   Comprehensive metabolic panel   CBC   Hemoglobin A1c   TSH   Lipid panel   Anemia   Relevant Orders   VITAMIN D 25 Hydroxy (Vit-D Deficiency, Fractures)   Comprehensive metabolic panel   CBC   Hemoglobin A1c   TSH   Lipid panel   GERD (gastroesophageal reflux disease)   Relevant Orders   VITAMIN D 25 Hydroxy (Vit-D Deficiency, Fractures)   Comprehensive metabolic panel   CBC   Hemoglobin A1c   TSH   Lipid panel   Overweight    Encouraged DASH diet, decrease po intake and increase exercise as tolerated. Needs 7-8 hours of sleep nightly. Avoid trans fats, eat small, frequent meals every 4-5 hours with lean proteins, complex carbs and healthy fats. Minimize simple carbs      Diarrhea    Improved. Maintain probiotics and fiber supplements      Hyperlipidemia, mixed    Encouraged heart healthy diet, avoid trans fats, minimize simple carbs and saturated fats. Increase exercise as tolerated      Relevant Orders   VITAMIN D 25 Hydroxy (Vit-D Deficiency, Fractures)   Comprehensive metabolic panel   CBC   Hemoglobin A1c   TSH   Lipid panel   Hyperglycemia - Primary    hgba1c acceptable, minimize simple carbs. Increase exercise as tolerated.      Relevant Orders   VITAMIN D 25 Hydroxy (Vit-D Deficiency, Fractures)   Comprehensive metabolic panel   CBC   Hemoglobin  A1c   TSH   Lipid panel   Vitamin D deficiency    Continue daily supplements and monitor      Relevant Orders   VITAMIN D 25 Hydroxy (Vit-D Deficiency, Fractures)   Comprehensive metabolic panel   CBC   Hemoglobin A1c   TSH   Lipid panel      I have discontinued Ms. Infantino's PRISTIQ, pantoprazole, and ranitidine. I am also having her start on venlafaxine. Additionally, I am having her maintain her  aspirin, Vitamin D3, Fish Oil, albuterol, beclomethasone, ALPRAZolam, LORazepam, phentermine, temazepam, and metoprolol succinate.  Meds ordered this encounter  Medications  . venlafaxine (EFFEXOR) 50 MG tablet    Sig: Take 1 tablet (50 mg total) by mouth 2 (two) times daily with a meal.    Dispense:  60 tablet    Refill:  2     Penni Homans, MD

## 2016-11-12 NOTE — Assessment & Plan Note (Signed)
Continue daily supplements and monitor  

## 2016-11-12 NOTE — Assessment & Plan Note (Signed)
Encouraged heart healthy diet, avoid trans fats, minimize simple carbs and saturated fats. Increase exercise as tolerated. 

## 2016-11-12 NOTE — Assessment & Plan Note (Signed)
hgba1c acceptable, minimize simple carbs. Increase exercise as tolerated.  

## 2016-11-20 ENCOUNTER — Other Ambulatory Visit: Payer: Self-pay | Admitting: Family Medicine

## 2016-11-20 MED FILL — LORazepam 1 MG TABS: 1 | 23 days supply | Qty: 70 | Fill #0

## 2016-11-20 NOTE — Telephone Encounter (Signed)
Faxed to [facility] lorazepam and temazepam

## 2016-11-20 NOTE — Telephone Encounter (Signed)
Requesting:   Temazepam and Lorazepam Contract  None UDS   None Last OV   10/31/2016 Last Refill   Temazepam  #30 with  1 refill 09/25/2016                     Lorazepam  #70  With 2 refills on  07/25/2016  Please Advise

## 2016-11-21 MED FILL — TEMAZEPAM 30 MG CAPSULE: 30 | 30 days supply | Qty: 30 | Fill #0

## 2016-12-18 MED FILL — LORazepam 1 MG TABS: 1 | 23 days supply | Qty: 70 | Fill #1

## 2016-12-19 MED FILL — TEMAZEPAM 30 MG CAPSULE: 30 | 30 days supply | Qty: 30 | Fill #1

## 2016-12-25 ENCOUNTER — Other Ambulatory Visit: Payer: Self-pay | Admitting: Family Medicine

## 2016-12-25 ENCOUNTER — Telehealth: Payer: Self-pay | Admitting: Family Medicine

## 2016-12-25 DIAGNOSIS — M791 Myalgia, unspecified site: Secondary | ICD-10-CM

## 2016-12-25 NOTE — Telephone Encounter (Signed)
Great check magnesium level for myalgias.

## 2016-12-25 NOTE — Telephone Encounter (Signed)
She would like her magnesium checked.  She is having myalgias like having the flu, but no flu.  So if you can check her magnesium.  Let me know and will put an order in at the [facility], [place] lab.

## 2016-12-25 NOTE — Telephone Encounter (Signed)
I checked it last year and it was normal. Let me know if she wants it checked againr

## 2016-12-25 NOTE — Telephone Encounter (Signed)
Pt says that she has been having some knee pain, she would like to know if provider has checked her magnesium? If not she would like to have order placed for it.  Please advise whenever your able.

## 2016-12-25 NOTE — Telephone Encounter (Signed)
Lab ordered at the [facility] the patient left a detailed message lab has been ordered.

## 2016-12-26 ENCOUNTER — Ambulatory Visit: Payer: 59 | Admitting: Family Medicine

## 2017-01-03 ENCOUNTER — Other Ambulatory Visit (INDEPENDENT_AMBULATORY_CARE_PROVIDER_SITE_OTHER): Payer: 59

## 2017-01-03 DIAGNOSIS — E559 Vitamin D deficiency, unspecified: Secondary | ICD-10-CM

## 2017-01-03 DIAGNOSIS — D649 Anemia, unspecified: Secondary | ICD-10-CM | POA: Diagnosis not present

## 2017-01-03 DIAGNOSIS — R739 Hyperglycemia, unspecified: Secondary | ICD-10-CM

## 2017-01-03 DIAGNOSIS — M791 Myalgia, unspecified site: Secondary | ICD-10-CM

## 2017-01-03 DIAGNOSIS — E782 Mixed hyperlipidemia: Secondary | ICD-10-CM | POA: Diagnosis not present

## 2017-01-03 DIAGNOSIS — K219 Gastro-esophageal reflux disease without esophagitis: Secondary | ICD-10-CM

## 2017-01-03 DIAGNOSIS — E042 Nontoxic multinodular goiter: Secondary | ICD-10-CM

## 2017-01-03 LAB — CBC
HCT: 38.9 % (ref 36.0–46.0)
HEMOGLOBIN: 13.3 g/dL (ref 12.0–15.0)
MCHC: 34.2 g/dL (ref 30.0–36.0)
MCV: 83.6 fl (ref 78.0–100.0)
PLATELETS: 316 10*3/uL (ref 150.0–400.0)
RBC: 4.65 Mil/uL (ref 3.87–5.11)
RDW: 13.3 % (ref 11.5–15.5)
WBC: 7.8 10*3/uL (ref 4.0–10.5)

## 2017-01-03 LAB — LIPID PANEL
CHOLESTEROL: 204 mg/dL — AB (ref 0–200)
HDL: 59.9 mg/dL (ref >39.00)
LDL CALC: 119 mg/dL — AB (ref 0–99)
NonHDL: 143.72
TRIGLYCERIDES: 124 mg/dL (ref 0.0–149.0)
Total CHOL/HDL Ratio: 3
VLDL: 24.8 mg/dL (ref 0.0–40.0)

## 2017-01-03 LAB — COMPREHENSIVE METABOLIC PANEL
ALT: 15 U/L (ref 0–35)
AST: 14 U/L (ref 0–37)
Albumin: 4.6 g/dL (ref 3.5–5.2)
Alkaline Phosphatase: 86 U/L (ref 39–117)
BUN: 17 mg/dL (ref 6–23)
CO2: 28 mEq/L (ref 19–32)
Calcium: 9.8 mg/dL (ref 8.4–10.5)
Chloride: 106 mEq/L (ref 96–112)
Creatinine, Ser: 0.74 mg/dL (ref 0.40–1.20)
GFR: 85.54 mL/min (ref >60.00)
Glucose, Bld: 114 mg/dL — ABNORMAL HIGH (ref 70–99)
Potassium: 4.3 mEq/L (ref 3.5–5.1)
Sodium: 140 mEq/L (ref 135–145)
Total Bilirubin: 0.5 mg/dL (ref 0.2–1.2)
Total Protein: 7.6 g/dL (ref 6.0–8.3)

## 2017-01-03 LAB — HEMOGLOBIN A1C: Hgb A1c MFr Bld: 5.9 % (ref 4.6–6.5)

## 2017-01-03 LAB — VITAMIN D 25 HYDROXY (VIT D DEFICIENCY, FRACTURES): VITD: 37.52 ng/mL (ref 30.00–100.00)

## 2017-01-03 LAB — TSH: TSH: 1.39 u[IU]/mL (ref 0.35–4.50)

## 2017-01-03 LAB — MAGNESIUM: Magnesium: 2.2 mg/dL (ref 1.5–2.5)

## 2017-01-09 ENCOUNTER — Ambulatory Visit (INDEPENDENT_AMBULATORY_CARE_PROVIDER_SITE_OTHER): Payer: 59 | Admitting: Family Medicine

## 2017-01-09 DIAGNOSIS — F418 Other specified anxiety disorders: Secondary | ICD-10-CM | POA: Diagnosis not present

## 2017-01-09 DIAGNOSIS — R739 Hyperglycemia, unspecified: Secondary | ICD-10-CM

## 2017-01-09 DIAGNOSIS — R000 Tachycardia, unspecified: Secondary | ICD-10-CM | POA: Diagnosis not present

## 2017-01-09 MED ORDER — OLANZAPINE 2.5 MG PO TABS: 2.5000 mg | ORAL_TABLET | Freq: Every day | ORAL | 2 refills | Status: DC

## 2017-01-09 MED ORDER — BUSPIRONE HCL 10 MG PO TABS: 10.0000 mg | ORAL_TABLET | Freq: Three times a day (TID) | ORAL | 2 refills | Status: DC

## 2017-01-09 NOTE — Progress Notes (Signed)
Pre visit review using our clinic review tool, if applicable. No additional management support is needed unless otherwise documented below in the visit note. 

## 2017-01-09 NOTE — Patient Instructions (Signed)
DASH Eating Plan DASH stands for "Dietary Approaches to Stop Hypertension." The DASH eating plan is a healthy eating plan that has been shown to reduce high blood pressure (hypertension). Additional health benefits may include reducing the risk of type 2 diabetes mellitus, heart disease, and stroke. The DASH eating plan may also help with weight loss. What do I need to know about the DASH eating plan? For the DASH eating plan, you will follow these general guidelines:  Choose foods with less than 150 milligrams of sodium per serving (as listed on the food label).  Use salt-free seasonings or herbs instead of table salt or sea salt.  Check with your health care provider or pharmacist before using salt substitutes.  Eat lower-sodium products. These are often labeled as "low-sodium" or "no salt added."  Eat fresh foods. Avoid eating a lot of canned foods.  Eat more vegetables, fruits, and low-fat dairy products.  Choose whole grains. Look for the word "whole" as the first word in the ingredient list.  Choose fish and skinless chicken or turkey more often than red meat. Limit fish, poultry, and meat to 6 oz (170 g) each day.  Limit sweets, desserts, sugars, and sugary drinks.  Choose heart-healthy fats.  Eat more home-cooked food and less restaurant, buffet, and fast food.  Limit fried foods.  Do not fry foods. Cook foods using methods such as baking, boiling, grilling, and broiling instead.  When eating at a restaurant, ask that your food be prepared with less salt, or no salt if possible. What foods can I eat? Seek help from a dietitian for individual calorie needs. Grains  Whole grain or whole wheat bread. Brown rice. Whole grain or whole wheat pasta. Quinoa, bulgur, and whole grain cereals. Low-sodium cereals. Corn or whole wheat flour tortillas. Whole grain cornbread. Whole grain crackers. Low-sodium crackers. Vegetables  Fresh or frozen vegetables (raw, steamed, roasted, or  grilled). Low-sodium or reduced-sodium tomato and vegetable juices. Low-sodium or reduced-sodium tomato sauce and paste. Low-sodium or reduced-sodium canned vegetables. Fruits  All fresh, canned (in natural juice), or frozen fruits. Meat and Other Protein Products  Ground beef (85% or leaner), grass-fed beef, or beef trimmed of fat. Skinless chicken or turkey. Ground chicken or turkey. Pork trimmed of fat. All fish and seafood. Eggs. Dried beans, peas, or lentils. Unsalted nuts and seeds. Unsalted canned beans. Dairy  Low-fat dairy products, such as skim or 1% milk, 2% or reduced-fat cheeses, low-fat ricotta or cottage cheese, or plain low-fat yogurt. Low-sodium or reduced-sodium cheeses. Fats and Oils  Tub margarines without trans fats. Light or reduced-fat mayonnaise and salad dressings (reduced sodium). Avocado. Safflower, olive, or canola oils. Natural peanut or almond butter. Other  Unsalted popcorn and pretzels. The items listed above may not be a complete list of recommended foods or beverages. Contact your dietitian for more options.  What foods are not recommended? Grains  White bread. White pasta. White rice. Refined cornbread. Bagels and croissants. Crackers that contain trans fat. Vegetables  Creamed or fried vegetables. Vegetables in a cheese sauce. Regular canned vegetables. Regular canned tomato sauce and paste. Regular tomato and vegetable juices. Fruits  Canned fruit in light or heavy syrup. Fruit juice. Meat and Other Protein Products  Fatty cuts of meat. Ribs, chicken wings, bacon, sausage, bologna, salami, chitterlings, fatback, hot dogs, bratwurst, and packaged luncheon meats. Salted nuts and seeds. Canned beans with salt. Dairy  Whole or 2% milk, cream, half-and-half, and cream cheese. Whole-fat or sweetened yogurt. Full-fat cheeses   or blue cheese. Nondairy creamers and whipped toppings. Processed cheese, cheese spreads, or cheese curds. Condiments  Onion and garlic  salt, seasoned salt, table salt, and sea salt. Canned and packaged gravies. Worcestershire sauce. Tartar sauce. Barbecue sauce. Teriyaki sauce. Soy sauce, including reduced sodium. Steak sauce. Fish sauce. Oyster sauce. Cocktail sauce. Horseradish. Ketchup and mustard. Meat flavorings and tenderizers. Bouillon cubes. Hot sauce. Tabasco sauce. Marinades. Taco seasonings. Relishes. Fats and Oils  Butter, stick margarine, lard, shortening, ghee, and bacon fat. Coconut, palm kernel, or palm oils. Regular salad dressings. Other  Pickles and olives. Salted popcorn and pretzels. The items listed above may not be a complete list of foods and beverages to avoid. Contact your dietitian for more information.  Where can I find more information? National Heart, Lung, and [employer] Institute: www.nhlbi.nih.gov/health/health-topics/topics/dash/ This information is not intended to replace advice given to you by your health care provider. Make sure you discuss any questions you have with your health care provider. Document Released: 11/09/2011 Document Revised: 04/27/2016 Document Reviewed: 09/24/2013 Elsevier Interactive Patient Education  2017 [employer]  

## 2017-01-09 NOTE — Progress Notes (Signed)
Patient ID: [name] Jensen, female   DOB: 05/20/1956, 61 y.o.   MRN: [id number]   Subjective:    Patient ID: [name] Jensen, female    DOB: [birth year], 61 y.o.   MRN: [id number]  Chief Complaint  Patient presents with  . Knee Pain   I acted as a Education administrator for Dr. Charlett Blake. [name], RMA  Knee Pain   The incident occurred more than 1 week ago. The quality of the pain is described as aching. She reports no foreign bodies present.    Patient is in today for knee pain and full body joint pain. She is very tearful today and acknowledges she has been struggling with increased stress at work and with her son. She also notes her dog died recently from Melanoma. She is crying easily and has difficulty concentrating. She is easily agitated.no suicidal ideation but she does acknowledge hopelessness as well. Denies CP/palp/SOB/HA/congestion/fevers/GI or GU c/o. Taking meds as prescribed   Past Medical History:  Diagnosis Date  . Adjustment reaction with anxiety and depression 06/12/2014  . Anemia    h/o low ferritin  . Anxiety   . Asthma    environmental triggers  . Chicken pox as a child  . Cough 08/22/2015  . Depression   . Depression with anxiety 06/12/2014  . Diverticulosis   . Ehrlichiosis 123456  . Gallstones   . GERD (gastroesophageal reflux disease)   . Hiatal hernia 2007  . History of viral illness   . Hyperlipidemia, mixed 04/11/2015  . Lactose intolerance 06/12/2014  . Mumps as a child  . Overweight 08/19/2014  . Rectocele 06/12/2014  . RLS (restless legs syndrome)   . 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 Sessile colonic polyp   . SVT (supraventricular tachycardia) ([place])   . Tachycardia 06/12/2014  . Thyroid disease   . Vitamin D deficiency 08/22/2015    Past Surgical History:  Procedure Laterality Date  . ABDOMINAL HYSTERECTOMY  11-2008   total with cystocele, rectocele repair  . APPENDECTOMY    . CHOLECYSTECTOMY    . COLONOSCOPY  2006   diverticulosis, hemorrhoids ([facility])  . ESOPHAGOGASTRODUODENOSCOPY  2006   small hiatus hernia ([facility])  . INCONTINENCE SURGERY    . SKIN SURGERY     right arm scc  . svt ablation    . TONSILLECTOMY  1972   2ith adenoid  . WISDOM TOOTH EXTRACTION  61 yrs old    Family History  Problem Relation Age of Onset  . Hypertension Mother   . Hypertension Father   . Hypertension Sister   . Diabetes Sister     type 2  . Fibromyalgia Sister   . GI Bleed Maternal Grandmother   . Atrial fibrillation Maternal Grandmother   . Heart disease Maternal Grandfather   . Diabetes Paternal Grandmother   . Stroke Paternal Grandfather   . Proteinuria Sister     Social History   Social History  . Marital status: Divorced    Spouse name: N/A  . Number of children: 2  . Years of education: N/A   Occupational History  . RN [employer]   Social History Main Topics  . Smoking status: Never Smoker  . Smokeless tobacco: Never Used  . Alcohol use No  . Drug use: No  . Sexual activity: Not on file     Comment: lives by self with 4 dogs works at [employer], avoids dairy   Other Topics Concern  .  Not on file   Social History Narrative  . No narrative on file    Outpatient Medications Prior to Visit  Medication Sig Dispense Refill  . albuterol (PROVENTIL HFA;VENTOLIN HFA) 108 (90 Base) MCG/ACT inhaler Inhale 2 puffs into the lungs every 6 (six) hours as needed for wheezing. 1 Inhaler 6  . ALPRAZolam (XANAX) 0.25 MG tablet TAKE 1 TABLET BY MOUTH TWICE DAILY AS NEEDED FOR ANXIETY 20 tablet 1  . aspirin (ASPIRIN EC) 81 MG EC tablet Take 81 mg by mouth at bedtime.     . beclomethasone (QVAR) 80 MCG/ACT inhaler Inhale 1 puff into the lungs daily. 1 Inhaler 6  . Cholecalciferol (VITAMIN D3) 2000 units TABS Take 1 tablet by mouth daily.    Marland Kitchen LORazepam (ATIVAN) 1 MG tablet TAKE 1/2 TO 1 TABLET BY MOUTH EVERY 8 HOURS AS NEEDED FOR ANXIETY 70 tablet 2  . metoprolol succinate (TOPROL-XL) 50 MG 24 hr tablet  Take 50 mg in Am and Take 100 mg in the PM 270 tablet 3  . Omega-3 Fatty Acids (FISH OIL) 1000 MG CAPS Take 2 capsules by mouth 2 (two) times daily.    . phentermine (ADIPEX-P) 37.5 MG tablet Take 1 tablet (37.5 mg total) by mouth every morning. 30 tablet 1  . temazepam (RESTORIL) 30 MG capsule TAKE 1 CAPSULE BY MOUTH DAILY AT BEDTIME 30 capsule 1  . venlafaxine (EFFEXOR) 50 MG tablet Take 1 tablet (50 mg total) by mouth 2 (two) times daily with a meal. 60 tablet 2   No facility-administered medications prior to visit.     Allergies  Allergen Reactions  . Dilaudid [Hydromorphone Hcl]     Respiratory , cardiac   . Effexor [Venlafaxine] Other (See Comments)    myalgia  . Abilify [Aripiprazole] Palpitations    Review of Systems  Constitutional: Negative for fever and malaise/fatigue.  HENT: Negative for congestion.   Eyes: Negative for blurred vision.  Respiratory: Negative for shortness of breath.   Cardiovascular: Negative for chest pain, palpitations and leg swelling.  Gastrointestinal: Negative for abdominal pain, blood in stool and nausea.  Genitourinary: Negative for dysuria and frequency.  Musculoskeletal: Positive for joint pain. Negative for falls.  Skin: Negative for rash.  Neurological: Negative for dizziness, loss of consciousness and headaches.  Endo/Heme/Allergies: Negative for environmental allergies.  Psychiatric/Behavioral: Negative for depression. The patient is not nervous/anxious.        Objective:    Physical Exam  Constitutional: She is oriented to person, place, and time. She appears well-developed and well-nourished. No distress.  HENT:  Head: Normocephalic and atraumatic.  Nose: Nose normal.  Eyes: Right eye exhibits no discharge. Left eye exhibits no discharge.  Neck: Normal range of motion. Neck supple.  Cardiovascular: Normal rate and regular rhythm.   No murmur heard. Pulmonary/Chest: Effort normal and breath sounds normal.  Abdominal: Soft.  Bowel sounds are normal. There is no tenderness.  Musculoskeletal: She exhibits no edema.  Neurological: She is alert and oriented to person, place, and time.  Skin: Skin is warm and dry.  Psychiatric: She has a normal mood and affect.  Nursing note and vitals reviewed.   BP 132/82 (BP Location: Left Arm, Patient Position: Sitting, Cuff Size: Normal)   Pulse 83   Temp 98.5 F (36.9 C) (Oral)   Wt 171 lb 9.6 oz (77.8 kg)   SpO2 97%   BMI 28.56 kg/m  Wt Readings from Last 3 Encounters:  01/09/17 171 lb 9.6 oz (77.8 kg)  10/31/16 160 lb (72.6 kg)  07/25/16 169 lb 6 oz (76.8 kg)     Lab Results  Component Value Date   WBC 7.8 01/03/2017   HGB 13.3 01/03/2017   HCT 38.9 01/03/2017   PLT 316.0 01/03/2017   GLUCOSE 114 (H) 01/03/2017   CHOL 204 (H) 01/03/2017   TRIG 124.0 01/03/2017   HDL 59.90 01/03/2017   LDLDIRECT 96.0 07/28/2015   LDLCALC 119 (H) 01/03/2017   ALT 15 01/03/2017   AST 14 01/03/2017   NA 140 01/03/2017   K 4.3 01/03/2017   CL 106 01/03/2017   CREATININE 0.74 01/03/2017   BUN 17 01/03/2017   CO2 28 01/03/2017   TSH 1.39 01/03/2017   INR 1.0 11/09/2008   HGBA1C 5.9 01/03/2017    Lab Results  Component Value Date   TSH 1.39 01/03/2017   Lab Results  Component Value Date   WBC 7.8 01/03/2017   HGB 13.3 01/03/2017   HCT 38.9 01/03/2017   MCV 83.6 01/03/2017   PLT 316.0 01/03/2017   Lab Results  Component Value Date   NA 140 01/03/2017   K 4.3 01/03/2017   CO2 28 01/03/2017   GLUCOSE 114 (H) 01/03/2017   BUN 17 01/03/2017   CREATININE 0.74 01/03/2017   BILITOT 0.5 01/03/2017   ALKPHOS 86 01/03/2017   AST 14 01/03/2017   ALT 15 01/03/2017   PROT 7.6 01/03/2017   ALBUMIN 4.6 01/03/2017   CALCIUM 9.8 01/03/2017   GFR 85.54 01/03/2017   Lab Results  Component Value Date   CHOL 204 (H) 01/03/2017   Lab Results  Component Value Date   HDL 59.90 01/03/2017   Lab Results  Component Value Date   LDLCALC 119 (H) 01/03/2017   Lab  Results  Component Value Date   TRIG 124.0 01/03/2017   Lab Results  Component Value Date   CHOLHDL 3 01/03/2017   Lab Results  Component Value Date   HGBA1C 5.9 01/03/2017       Assessment & Plan:   Problem List Items Addressed This Visit    Tachycardia    RRR today      Depression with anxiety    She had a second dog die and her son continues to struggle. She is also noting increased stress at work as well. May increase Zyprexa to 5 mg and if no improvement then can try Buspar. Reassess. F/u in3-4 weeks. sprent 35 minutes of a 40 minute appt in counseling      Hyperglycemia    minimize simple carbs. Increase exercise as tolerated.          I am having Ms. Harding start on busPIRone and OLANZapine. I am also having her maintain her aspirin, Vitamin D3, Fish Oil, albuterol, beclomethasone, ALPRAZolam, phentermine, metoprolol succinate, venlafaxine, LORazepam, and temazepam.  Meds ordered this encounter  Medications  . busPIRone (BUSPAR) 10 MG tablet    Sig: Take 1 tablet (10 mg total) by mouth 3 (three) times daily.    Dispense:  90 tablet    Refill:  2  . OLANZapine (ZYPREXA) 2.5 MG tablet    Sig: Take 1-2 tablets (2.5-5 mg total) by mouth at bedtime.    Dispense:  60 tablet    Refill:  2    CMA served as scribe during this visit. History, Physical and Plan performed by medical provider. Documentation and orders reviewed and attested to.  Penni Homans, MD

## 2017-01-10 MED FILL — busPIRone HCL 10 MG TABS: 10 | 30 days supply | Qty: 90 | Fill #0

## 2017-01-12 MED FILL — LORazepam 1 MG TABS: 1 | 23 days supply | Qty: 70 | Fill #2

## 2017-01-14 NOTE — Assessment & Plan Note (Signed)
minimize simple carbs. Increase exercise as tolerated.  

## 2017-01-14 NOTE — Assessment & Plan Note (Addendum)
She had a second dog die and her son continues to struggle. She is also noting increased stress at work as well. May increase Zyprexa to 5 mg and if no improvement then can try Buspar. Reassess. F/u in3-4 weeks. sprent 35 minutes of a 40 minute appt in counseling

## 2017-01-14 NOTE — Assessment & Plan Note (Signed)
RRR today 

## 2017-01-16 ENCOUNTER — Telehealth: Payer: Self-pay | Admitting: Family Medicine

## 2017-01-16 NOTE — Telephone Encounter (Signed)
Last office visit 01/09/2017--next scheduled office visit 02/06/2017 Last refill  #30 with 2 refills on 11/20/2017 Pharmacy is [facility]

## 2017-01-16 NOTE — Telephone Encounter (Signed)
OK to refill Temazepam with 30 day supply and 2 rf

## 2017-01-16 NOTE — Telephone Encounter (Signed)
Relation to [id number] Call back number:[phone] Pharmacy: [place], [place] - [address] [phone] (Phone) [phone] (Fax)     Reason for call:  Patient requesting refill temazepam (RESTORIL) 30 MG capsule, patient states pharmacy sent 2 request, patient would like rX today please send to retail

## 2017-01-17 ENCOUNTER — Other Ambulatory Visit: Payer: Self-pay

## 2017-01-17 MED ORDER — TEMAZEPAM 30 MG PO CAPS: 30.0000 mg | ORAL_CAPSULE | Freq: Every day | ORAL | 2 refills | Status: DC

## 2017-01-17 MED FILL — METOPROLOL SUCC ER 50 MG TA: 50 | 90 days supply | Qty: 270 | Fill #1

## 2017-01-17 NOTE — Telephone Encounter (Signed)
Rx printed for Dr. Rhae Lerner signature. Left on CMA's desk.

## 2017-01-18 MED FILL — TEMAZEPAM 30 MG CAPSULE: 30 | 30 days supply | Qty: 30 | Fill #0

## 2017-01-18 NOTE — Telephone Encounter (Signed)
If the nosebleed has stopped it sound like she might have a stomach virus and she does not necessarily need to be seen unless she is worried. If nose bleed will not stop needs to be seen. Otherwise 24 hours of clear fluids and then progress to [employer] for 24 hours (bananas, rice, applesauce and toast)

## 2017-01-18 NOTE — Telephone Encounter (Signed)
Faxed hardcopy to [facility]. Called the patient informed done. Patient is having a spontaneous nose bleed today (take asa and cardiologist told  today)., she is nauseated and has some diarrhea., BP is ok-- she is not feeling well. Advise if needs to be seen.

## 2017-01-18 NOTE — Telephone Encounter (Signed)
Patient informed of PCP instructions.  The nose bleed has stopped and agrees with PCP.

## 2017-02-01 ENCOUNTER — Encounter: Payer: Self-pay | Admitting: Family Medicine

## 2017-02-01 ENCOUNTER — Other Ambulatory Visit: Payer: Self-pay | Admitting: Family Medicine

## 2017-02-01 NOTE — Telephone Encounter (Signed)
OK to refill, but needs UDS and contract

## 2017-02-01 NOTE — Telephone Encounter (Signed)
Patient was informed prescription ready/contact/uds. She said for some reason the zyprexa did not get approved due to the way it was written and thinks if she could get that it would help.  Also is taking the buspar as prescribed along with the lorazepam and is helping,  But needs something else.  States she is in a bad way with work/home right now and not sure how much more she can take. She cannot come sign a contract/UDS in order to pickup script/works in [place] and is impossible to get off as they have only 2 RN's

## 2017-02-01 NOTE — Telephone Encounter (Signed)
We can wait on the contract UDS and contract til next visit. Can we check with her pharmacy and see what we have to do with her Zyprexa to get it approved. Or would they pay for another similar med instead? Abilify?

## 2017-02-01 NOTE — Telephone Encounter (Signed)
Received Rx request for Lorazepam 1mg  (take 1/2-1 tab po q8h prn for anxiety)  Last Rf: 01/12/2017 Last Ov: 01/09/2017 Next Ov: 02/06/2017 UDS: None  Forwarded to Provider for review, approval or denial.

## 2017-02-02 MED ORDER — OLANZAPINE 2.5 MG PO TABS: 2.5000 mg | ORAL_TABLET | Freq: Every day | ORAL | 3 refills | Status: DC

## 2017-02-02 MED FILL — LORazepam 1 MG TABS: 1 | 23 days supply | Qty: 70 | Fill #0

## 2017-02-02 NOTE — Telephone Encounter (Signed)
[facility] pharmacy call with clarification on zyprexa Insurance will not pay for the way it is written. If you prescribe olanzapine 2.5 OR 5 mg to take once at bedtime they will pay, #30   PCP made aware and did ok the 2.5 mg one daily. Updated list and patient informed Sent to [facility] corrected.

## 2017-02-02 NOTE — Telephone Encounter (Signed)
[employer]. Faxed lorazepam to [facility]. Patient informed of PCP instructions and will discuss all information with PCP at appt this coming Tuesday.

## 2017-02-02 NOTE — Addendum Note (Signed)
Addended by: Sharon Seller B on: 02/02/2017 09:10 AM   Modules accepted: Orders

## 2017-02-02 NOTE — Telephone Encounter (Signed)
Patient informed. 

## 2017-02-06 ENCOUNTER — Ambulatory Visit (INDEPENDENT_AMBULATORY_CARE_PROVIDER_SITE_OTHER): Payer: 59 | Admitting: Family Medicine

## 2017-02-06 ENCOUNTER — Encounter: Payer: Self-pay | Admitting: Family Medicine

## 2017-02-06 VITALS — BP 142/86 | HR 83 | Temp 98.4°F | Resp 18 | Wt 170.0 lb

## 2017-02-06 DIAGNOSIS — F419 Anxiety disorder, unspecified: Principal | ICD-10-CM

## 2017-02-06 DIAGNOSIS — F418 Other specified anxiety disorders: Secondary | ICD-10-CM | POA: Diagnosis not present

## 2017-02-06 DIAGNOSIS — E663 Overweight: Secondary | ICD-10-CM

## 2017-02-06 DIAGNOSIS — F329 Major depressive disorder, single episode, unspecified: Secondary | ICD-10-CM

## 2017-02-06 DIAGNOSIS — E782 Mixed hyperlipidemia: Secondary | ICD-10-CM

## 2017-02-06 DIAGNOSIS — R000 Tachycardia, unspecified: Secondary | ICD-10-CM

## 2017-02-06 DIAGNOSIS — R739 Hyperglycemia, unspecified: Secondary | ICD-10-CM | POA: Diagnosis not present

## 2017-02-06 DIAGNOSIS — F32A Depression, unspecified: Secondary | ICD-10-CM

## 2017-02-06 MED ORDER — LAMOTRIGINE 100 MG PO TABS: 100.0000 mg | ORAL_TABLET | Freq: Every day | ORAL | 1 refills | Status: DC

## 2017-02-06 MED ORDER — BUSPIRONE HCL 15 MG PO TABS: 15.0000 mg | ORAL_TABLET | Freq: Three times a day (TID) | ORAL | 1 refills | Status: DC

## 2017-02-06 NOTE — Progress Notes (Signed)
Subjective:  I acted as a Education administrator for Dr. Charlett Blake. [name], [place]   Patient ID: [name] Jensen, female    DOB: 1956/07/11, 61 y.o.   MRN: [id number]  No chief complaint on file.   HPI  Patient is in today for 4 week follow up and she continues to struggle with depression and anxiety. She decided to not start Abilify due to concerns about side effects. She continues to feel very stressed at work. She feels anxious and struggles with anhedonia but not suicidal ideation. She believes the Buspar has helped some with the anxiety. Denies CP/palp/SOB/HA/congestion/fevers/GI or GU c/o. Taking meds as prescribed  Patient Care Team: [name], MD as PCP - General (Family Medicine)   Past Medical History:  Diagnosis Date  . Adjustment reaction with anxiety and depression 06/12/2014  . Anemia    h/o low ferritin  . Anxiety   . Asthma    environmental triggers  . Chicken pox as a child  . Cough 08/22/2015  . Depression   . Depression with anxiety 06/12/2014  . Diverticulosis   . Ehrlichiosis 123456  . Gallstones   . GERD (gastroesophageal reflux disease)   . Hiatal hernia 2007  . History of viral illness   . Hyperlipidemia, mixed 04/11/2015  . Lactose intolerance 06/12/2014  . Mumps as a child  . Overweight 08/19/2014  . Rectocele 06/12/2014  . RLS (restless legs syndrome)   . 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 Sessile colonic polyp   . SVT (supraventricular tachycardia) ([place])   . Tachycardia 06/12/2014  . Thyroid disease   . Vitamin D deficiency 08/22/2015    Past Surgical History:  Procedure Laterality Date  . ABDOMINAL HYSTERECTOMY  11-2008   total with cystocele, rectocele repair  . APPENDECTOMY    . CHOLECYSTECTOMY    . COLONOSCOPY  2006   diverticulosis, hemorrhoids ([facility])  . ESOPHAGOGASTRODUODENOSCOPY  2006   small hiatus hernia ([facility])  . INCONTINENCE SURGERY    . SKIN SURGERY     right arm scc  . svt ablation    . TONSILLECTOMY   1972   2ith adenoid  . WISDOM TOOTH EXTRACTION  61 yrs old    Family History  Problem Relation Age of Onset  . Hypertension Mother   . Hypertension Father   . Hypertension Sister   . Diabetes Sister     type 2  . Fibromyalgia Sister   . GI Bleed Maternal Grandmother   . Atrial fibrillation Maternal Grandmother   . Heart disease Maternal Grandfather   . Diabetes Paternal Grandmother   . Stroke Paternal Grandfather   . Proteinuria Sister     Social History   Social History  . Marital status: Divorced    Spouse name: N/A  . Number of children: 2  . Years of education: N/A   Occupational History  . RN [employer]   Social History Main Topics  . Smoking status: Never Smoker  . Smokeless tobacco: Never Used  . Alcohol use No  . Drug use: No  . Sexual activity: Not on file     Comment: lives by self with 4 dogs works at [employer], avoids dairy   Other Topics Concern  . Not on file   Social History Narrative  . No narrative on file    Outpatient Medications Prior to Visit  Medication Sig Dispense Refill  . albuterol (PROVENTIL HFA;VENTOLIN HFA) 108 (90 Base) MCG/ACT inhaler Inhale  2 puffs into the lungs every 6 (six) hours as needed for wheezing. 1 Inhaler 6  . aspirin (ASPIRIN EC) 81 MG EC tablet Take 81 mg by mouth at bedtime.     . beclomethasone (QVAR) 80 MCG/ACT inhaler Inhale 1 puff into the lungs daily. 1 Inhaler 6  . Cholecalciferol (VITAMIN D3) 2000 units TABS Take 1 tablet by mouth daily.    Marland Kitchen LORazepam (ATIVAN) 1 MG tablet TAKE 1/2- 1 TABLET BY MOUTH EVERY 8 HOURS AS NEEDED FOR ANXIETY 70 tablet 2  . metoprolol succinate (TOPROL-XL) 50 MG 24 hr tablet Take 50 mg in Am and Take 100 mg in the PM 270 tablet 3  . Omega-3 Fatty Acids (FISH OIL) 1000 MG CAPS Take 2 capsules by mouth 2 (two) times daily.    . temazepam (RESTORIL) 30 MG capsule Take 1 capsule (30 mg total) by mouth at bedtime. 30 capsule 2  . ALPRAZolam (XANAX) 0.25 MG tablet TAKE 1 TABLET BY  MOUTH TWICE DAILY AS NEEDED FOR ANXIETY 20 tablet 1  . busPIRone (BUSPAR) 10 MG tablet Take 1 tablet (10 mg total) by mouth 3 (three) times daily. 90 tablet 2  . OLANZapine (ZYPREXA) 2.5 MG tablet Take 1 tablet (2.5 mg total) by mouth at bedtime. (Patient not taking: Reported on 02/06/2017) 30 tablet 3  . phentermine (ADIPEX-P) 37.5 MG tablet Take 1 tablet (37.5 mg total) by mouth every morning. (Patient not taking: Reported on 02/06/2017) 30 tablet 1  . venlafaxine (EFFEXOR) 50 MG tablet Take 1 tablet (50 mg total) by mouth 2 (two) times daily with a meal. 60 tablet 2   No facility-administered medications prior to visit.     Allergies  Allergen Reactions  . Dilaudid [Hydromorphone Hcl]     Respiratory , cardiac   . Effexor [Venlafaxine] Other (See Comments)    myalgia  . Abilify [Aripiprazole] Palpitations    Review of Systems  Constitutional: Negative for fever and malaise/fatigue.  HENT: Negative for congestion.   Eyes: Negative for blurred vision.  Respiratory: Negative for shortness of breath.   Cardiovascular: Negative for chest pain, palpitations and leg swelling.  Gastrointestinal: Negative for abdominal pain, blood in stool and nausea.  Genitourinary: Negative for dysuria and frequency.  Musculoskeletal: Negative for falls.  Skin: Negative for rash.  Neurological: Negative for dizziness, loss of consciousness and headaches.  Endo/Heme/Allergies: Negative for environmental allergies.  Psychiatric/Behavioral: Positive for depression. Negative for hallucinations and substance abuse. The patient is nervous/anxious and has insomnia.        Objective:    Physical Exam  Constitutional: She is oriented to person, place, and time. She appears well-developed and well-nourished. No distress.  HENT:  Head: Normocephalic and atraumatic.  Nose: Nose normal.  Eyes: Right eye exhibits no discharge. Left eye exhibits no discharge.  Neck: Normal range of motion. Neck supple.    Cardiovascular: Normal rate and regular rhythm.   No murmur heard. Pulmonary/Chest: Effort normal and breath sounds normal.  Abdominal: Soft. Bowel sounds are normal. There is no tenderness.  Musculoskeletal: She exhibits no edema.  Neurological: She is alert and oriented to person, place, and time.  Skin: Skin is warm and dry.  Psychiatric: She has a normal mood and affect.  Nursing note and vitals reviewed.   BP (!) 142/86 (BP Location: Left Arm, Patient Position: Sitting, Cuff Size: Normal)   Pulse 83   Temp 98.4 F (36.9 C) (Oral)   Resp 18   Wt 170 lb (77.1 kg)  SpO2 98%   BMI 28.29 kg/m  Wt Readings from Last 3 Encounters:  02/06/17 170 lb (77.1 kg)  01/09/17 171 lb 9.6 oz (77.8 kg)  10/31/16 160 lb (72.6 kg)     Lab Results  Component Value Date   WBC 7.8 01/03/2017   HGB 13.3 01/03/2017   HCT 38.9 01/03/2017   PLT 316.0 01/03/2017   GLUCOSE 114 (H) 01/03/2017   CHOL 204 (H) 01/03/2017   TRIG 124.0 01/03/2017   HDL 59.90 01/03/2017   LDLDIRECT 96.0 07/28/2015   LDLCALC 119 (H) 01/03/2017   ALT 15 01/03/2017   AST 14 01/03/2017   NA 140 01/03/2017   K 4.3 01/03/2017   CL 106 01/03/2017   CREATININE 0.74 01/03/2017   BUN 17 01/03/2017   CO2 28 01/03/2017   TSH 1.39 01/03/2017   INR 1.0 11/09/2008   HGBA1C 5.9 01/03/2017    Lab Results  Component Value Date   TSH 1.39 01/03/2017   Lab Results  Component Value Date   WBC 7.8 01/03/2017   HGB 13.3 01/03/2017   HCT 38.9 01/03/2017   MCV 83.6 01/03/2017   PLT 316.0 01/03/2017   Lab Results  Component Value Date   NA 140 01/03/2017   K 4.3 01/03/2017   CO2 28 01/03/2017   GLUCOSE 114 (H) 01/03/2017   BUN 17 01/03/2017   CREATININE 0.74 01/03/2017   BILITOT 0.5 01/03/2017   ALKPHOS 86 01/03/2017   AST 14 01/03/2017   ALT 15 01/03/2017   PROT 7.6 01/03/2017   ALBUMIN 4.6 01/03/2017   CALCIUM 9.8 01/03/2017   GFR 85.54 01/03/2017   Lab Results  Component Value Date   CHOL 204 (H)  01/03/2017   Lab Results  Component Value Date   HDL 59.90 01/03/2017   Lab Results  Component Value Date   LDLCALC 119 (H) 01/03/2017   Lab Results  Component Value Date   TRIG 124.0 01/03/2017   Lab Results  Component Value Date   CHOLHDL 3 01/03/2017   Lab Results  Component Value Date   HGBA1C 5.9 01/03/2017       Assessment & Plan:   Problem List Items Addressed This Visit    Tachycardia    RRR today on the metoprolol      Depression with anxiety    Has used Lamictal in past with decent results. Restarted and referred to psychiatry. May continue Buspar increased to 20 mg po tid to see if that helps more. Seek care if worsens.       Overweight    Encouraged DASH diet, decrease po intake and increase exercise as tolerated. Needs 7-8 hours of sleep nightly. Avoid trans fats, eat small, frequent meals every 4-5 hours with lean proteins, complex carbs and healthy fats. Minimize simple carbs      Hyperlipidemia, mixed    Encouraged heart healthy diet, increase exercise, avoid trans fats, consider a krill oil cap daily      Hyperglycemia    hgba1c acceptable, minimize simple carbs. Increase exercise as tolerated.        Other Visit Diagnoses    Anxiety and depression    -  Primary   Relevant Orders   Ambulatory referral to Psychiatry      I have discontinued Ms. Thom's ALPRAZolam, phentermine, venlafaxine, busPIRone, and OLANZapine. I am also having her start on lamoTRIgine and busPIRone. Additionally, I am having her maintain her aspirin, Vitamin D3, Fish Oil, albuterol, beclomethasone, metoprolol succinate, temazepam, and LORazepam.  Meds ordered this encounter  Medications  . lamoTRIgine (LAMICTAL) 100 MG tablet    Sig: Take 1 tablet (100 mg total) by mouth daily.    Dispense:  30 tablet    Refill:  1  . busPIRone (BUSPAR) 15 MG tablet    Sig: Take 1 tablet (15 mg total) by mouth 3 (three) times daily.    Dispense:  90 tablet    Refill:  1     CMA served as Education administrator during this visit. History, Physical and Plan performed by medical provider. Documentation and orders reviewed and attested to.  Penni Homans, MD

## 2017-02-06 NOTE — Progress Notes (Signed)
Pre visit review using our clinic review tool, if applicable. No additional management support is needed unless otherwise documented below in the visit note. 

## 2017-02-06 NOTE — Patient Instructions (Signed)

## 2017-02-07 MED FILL — busPIRone HCL 15 MG TABS: 15 | 30 days supply | Qty: 90 | Fill #0

## 2017-02-07 MED FILL — lamoTRIgine 100 MG TABS: 100 | 30 days supply | Qty: 30 | Fill #0

## 2017-02-07 NOTE — Assessment & Plan Note (Signed)
hgba1c acceptable, minimize simple carbs. Increase exercise as tolerated.  

## 2017-02-07 NOTE — Assessment & Plan Note (Signed)
Has used Lamictal in past with decent results. Restarted and referred to psychiatry. May continue Buspar increased to 20 mg po tid to see if that helps more. Seek care if worsens.

## 2017-02-07 NOTE — Assessment & Plan Note (Signed)
Encouraged DASH diet, decrease po intake and increase exercise as tolerated. Needs 7-8 hours of sleep nightly. Avoid trans fats, eat small, frequent meals every 4-5 hours with lean proteins, complex carbs and healthy fats. Minimize simple carbs 

## 2017-02-07 NOTE — Assessment & Plan Note (Signed)
RRR today on the metoprolol

## 2017-02-07 NOTE — Assessment & Plan Note (Signed)
Encouraged heart healthy diet, increase exercise, avoid trans fats, consider a krill oil cap daily 

## 2017-02-19 MED FILL — TEMAZEPAM 30 MG CAPSULE: 30 | 30 days supply | Qty: 30 | Fill #1

## 2017-03-13 ENCOUNTER — Ambulatory Visit: Payer: 59 | Admitting: Family Medicine

## 2017-03-13 MED FILL — lamoTRIgine 100 MG TABS: 100 | 30 days supply | Qty: 30 | Fill #1

## 2017-03-13 MED FILL — busPIRone HCL 15 MG TABS: 15 | 30 days supply | Qty: 90 | Fill #1

## 2017-03-13 MED FILL — LORazepam 1 MG TABS: 1 | 23 days supply | Qty: 70 | Fill #1

## 2017-03-19 MED FILL — TEMAZEPAM 30 MG CAPSULE: 30 | 30 days supply | Qty: 30 | Fill #2

## 2017-03-27 ENCOUNTER — Encounter: Payer: Self-pay | Admitting: Family Medicine

## 2017-03-27 ENCOUNTER — Ambulatory Visit (INDEPENDENT_AMBULATORY_CARE_PROVIDER_SITE_OTHER): Payer: 59 | Admitting: Family Medicine

## 2017-03-27 VITALS — BP 119/56 | HR 77 | Temp 98.2°F | Wt 184.4 lb

## 2017-03-27 DIAGNOSIS — R000 Tachycardia, unspecified: Secondary | ICD-10-CM | POA: Diagnosis not present

## 2017-03-27 DIAGNOSIS — L659 Nonscarring hair loss, unspecified: Secondary | ICD-10-CM

## 2017-03-27 DIAGNOSIS — M25561 Pain in right knee: Secondary | ICD-10-CM | POA: Diagnosis not present

## 2017-03-27 DIAGNOSIS — E559 Vitamin D deficiency, unspecified: Secondary | ICD-10-CM

## 2017-03-27 DIAGNOSIS — R739 Hyperglycemia, unspecified: Secondary | ICD-10-CM

## 2017-03-27 DIAGNOSIS — F418 Other specified anxiety disorders: Secondary | ICD-10-CM | POA: Diagnosis not present

## 2017-03-27 DIAGNOSIS — E6609 Other obesity due to excess calories: Secondary | ICD-10-CM

## 2017-03-27 MED ORDER — TEMAZEPAM 30 MG PO CAPS: 30.0000 mg | ORAL_CAPSULE | Freq: Every day | ORAL | 2 refills | Status: DC

## 2017-03-27 MED ORDER — BUSPIRONE HCL 15 MG PO TABS: 15.0000 mg | ORAL_TABLET | Freq: Four times a day (QID) | ORAL | 1 refills | Status: DC | PRN

## 2017-03-27 NOTE — Patient Instructions (Signed)
Insomnia Insomnia is a sleep disorder that makes it difficult to fall asleep or to stay asleep. Insomnia can cause tiredness (fatigue), low energy, difficulty concentrating, mood swings, and poor performance at work or school. There are three different ways to classify insomnia:  Difficulty falling asleep.  Difficulty staying asleep.  Waking up too early in the morning. Any type of insomnia can be long-term (chronic) or short-term (acute). Both are common. Short-term insomnia usually lasts for three months or less. Chronic insomnia occurs at least three times a week for longer than three months. What are the causes? Insomnia may be caused by another condition, situation, or substance, such as:  Anxiety.  Certain medicines.  Gastroesophageal reflux disease (GERD) or other gastrointestinal conditions.  Asthma or other breathing conditions.  Restless legs syndrome, sleep apnea, or other sleep disorders.  Chronic pain.  Menopause. This may include hot flashes.  Stroke.  Abuse of alcohol, tobacco, or illegal drugs.  Depression.  Caffeine.  Neurological disorders, such as Alzheimer disease.  An overactive thyroid (hyperthyroidism). The cause of insomnia may not be known. What increases the risk? Risk factors for insomnia include:  Gender. Women are more commonly affected than men.  Age. Insomnia is more common as you get older.  Stress. This may involve your professional or personal life.  Income. Insomnia is more common in people with lower income.  Lack of exercise.  Irregular work schedule or night shifts.  Traveling between different time zones. What are the signs or symptoms? If you have insomnia, trouble falling asleep or trouble staying asleep is the main symptom. This may lead to other symptoms, such as:  Feeling fatigued.  Feeling nervous about going to sleep.  Not feeling rested in the morning.  Having trouble concentrating.  Feeling irritable,  anxious, or depressed. How is this treated? Treatment for insomnia depends on the cause. If your insomnia is caused by an underlying condition, treatment will focus on addressing the condition. Treatment may also include:  Medicines to help you sleep.  Counseling or therapy.  Lifestyle adjustments. Follow these instructions at home:  Take medicines only as directed by your health care provider.  Keep regular sleeping and waking hours. Avoid naps.  Keep a sleep diary to help you and your health care provider figure out what could be causing your insomnia. Include:  When you sleep.  When you wake up during the night.  How well you sleep.  How rested you feel the next day.  Any side effects of medicines you are taking.  What you eat and drink.  Make your bedroom a comfortable place where it is easy to fall asleep:  Put up shades or special blackout curtains to block light from outside.  Use a white noise machine to block noise.  Keep the temperature cool.  Exercise regularly as directed by your health care provider. Avoid exercising right before bedtime.  Use relaxation techniques to manage stress. Ask your health care provider to suggest some techniques that may work well for you. These may include:  Breathing exercises.  Routines to release muscle tension.  Visualizing peaceful scenes.  Cut back on alcohol, caffeinated beverages, and cigarettes, especially close to bedtime. These can disrupt your sleep.  Do not overeat or eat spicy foods right before bedtime. This can lead to digestive discomfort that can make it hard for you to sleep.  Limit screen use before bedtime. This includes:  Watching TV.  Using your smartphone, tablet, and computer.  Stick to a   routine. This can help you fall asleep faster. Try to do a quiet activity, brush your teeth, and go to bed at the same time each night.  Get out of bed if you are still awake after 15 minutes of trying to  sleep. Keep the lights down, but try reading or doing a quiet activity. When you feel sleepy, go back to bed.  Make sure that you drive carefully. Avoid driving if you feel very sleepy.  Keep all follow-up appointments as directed by your health care provider. This is important. Contact a health care provider if:  You are tired throughout the day or have trouble in your daily routine due to sleepiness.  You continue to have sleep problems or your sleep problems get worse. Get help right away if:  You have serious thoughts about hurting yourself or someone else. This information is not intended to replace advice given to you by your health care provider. Make sure you discuss any questions you have with your health care provider. Document Released: 11/17/2000 Document Revised: 04/21/2016 Document Reviewed: 08/21/2014 Elsevier Interactive Patient Education  2017 [employer] Inc[employer]  

## 2017-03-27 NOTE — Progress Notes (Signed)
Patient ID: [name] Jensen, female   DOB: 01/24/1956, 61 y.o.   MRN: [id number]   Subjective:  I acted as a Education administrator for Penni Homans, [place], [place]   Patient ID: [name] Jensen, female    DOB: 1956/08/07, 61 y.o.   MRN: [id number]  Chief Complaint  Patient presents with  . Depression  . Knee Pain    HPI  Patient is in today for a 6-week follow up for depression. Also has a complaint of pain in the right knee, pain has been present about 4 weeks and she denies any injury, redness or swelling. Worse with twisting motions or trying to kneel. weight bearing is manageable. Patient has a Hx of asthma, depression, GERD. Patient has no additional acute concerns noted at this time. She continues to struggle with fatigue, anxiety, anhedonia depression but denies suicidal ideation. She believes her Buspar is helping. No recent febrile illness or hospitalizations. Denies CP/palp/SOB/HA/congestion/fevers or GU c/o. Taking meds as prescribed  Patient Care Team: [name], MD as PCP - General (Family Medicine)   Past Medical History:  Diagnosis Date  . Adjustment reaction with anxiety and depression 06/12/2014  . Anemia    h/o low ferritin  . Anxiety   . Asthma    environmental triggers  . Chicken pox as a child  . Cough 08/22/2015  . Depression   . Depression with anxiety 06/12/2014  . Diverticulosis   . Ehrlichiosis 9024  . Gallstones   . GERD (gastroesophageal reflux disease)   . Hair loss 03/28/2017  . Hiatal hernia 2007  . History of viral illness   . Hyperlipidemia, mixed 04/11/2015  . Lactose intolerance 06/12/2014  . Mumps as a child  . Overweight 08/19/2014  . Rectocele 06/12/2014  . RLS (restless legs syndrome)   . 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 Sessile colonic polyp   . SVT (supraventricular tachycardia) ([place])   . Tachycardia 06/12/2014  . Thyroid disease   . Vitamin D deficiency 08/22/2015    Past Surgical History:    Procedure Laterality Date  . ABDOMINAL HYSTERECTOMY  11-2008   total with cystocele, rectocele repair  . APPENDECTOMY    . CHOLECYSTECTOMY    . COLONOSCOPY  2006   diverticulosis, hemorrhoids ([place])  . ESOPHAGOGASTRODUODENOSCOPY  2006   small hiatus hernia ([place])  . INCONTINENCE SURGERY    . SKIN SURGERY     right arm scc  . svt ablation    . TONSILLECTOMY  1972   2ith adenoid  . WISDOM TOOTH EXTRACTION  61 yrs old    Family History  Problem Relation Age of Onset  . Hypertension Mother   . Hypertension Father   . Hypertension Sister   . Diabetes Sister     type 2  . Fibromyalgia Sister   . GI Bleed Maternal Grandmother   . Atrial fibrillation Maternal Grandmother   . Heart disease Maternal Grandfather   . Diabetes Paternal Grandmother   . Stroke Paternal Grandfather   . Proteinuria Sister     Social History   Social History  . Marital status: Divorced    Spouse name: N/A  . Number of children: 2  . Years of education: N/A   Occupational History  . RN [employer]   Social History Main Topics  . Smoking status: Never Smoker  . Smokeless tobacco: Never Used  . Alcohol use No  . Drug use: No  . Sexual activity: Not  on file     Comment: lives by self with 4 dogs works at [employer], avoids dairy   Other Topics Concern  . Not on file   Social History Narrative  . No narrative on file    Outpatient Medications Prior to Visit  Medication Sig Dispense Refill  . albuterol (PROVENTIL HFA;VENTOLIN HFA) 108 (90 Base) MCG/ACT inhaler Inhale 2 puffs into the lungs every 6 (six) hours as needed for wheezing. 1 Inhaler 6  . aspirin (ASPIRIN EC) 81 MG EC tablet Take 81 mg by mouth at bedtime.     . beclomethasone (QVAR) 80 MCG/ACT inhaler Inhale 1 puff into the lungs daily. 1 Inhaler 6  . Cholecalciferol (VITAMIN D3) 2000 units TABS Take 1 tablet by mouth daily.    Marland Kitchen lamoTRIgine (LAMICTAL) 100 MG tablet Take 1 tablet (100 mg total) by mouth daily. 30 tablet 1  .  LORazepam (ATIVAN) 1 MG tablet TAKE 1/2- 1 TABLET BY MOUTH EVERY 8 HOURS AS NEEDED FOR ANXIETY 70 tablet 2  . metoprolol succinate (TOPROL-XL) 50 MG 24 hr tablet Take 50 mg in Am and Take 100 mg in the PM 270 tablet 3  . Omega-3 Fatty Acids (FISH OIL) 1000 MG CAPS Take 2 capsules by mouth 2 (two) times daily.    . busPIRone (BUSPAR) 15 MG tablet Take 1 tablet (15 mg total) by mouth 3 (three) times daily. 90 tablet 1  . temazepam (RESTORIL) 30 MG capsule Take 1 capsule (30 mg total) by mouth at bedtime. 30 capsule 2   No facility-administered medications prior to visit.     Allergies  Allergen Reactions  . Dilaudid [Hydromorphone Hcl]     Respiratory , cardiac   . Effexor [Venlafaxine] Other (See Comments)    myalgia  . Abilify [Aripiprazole] Palpitations    Review of Systems  Constitutional: Positive for malaise/fatigue. Negative for fever.  HENT: Negative for congestion.   Eyes: Negative for blurred vision.  Respiratory: Negative for shortness of breath.   Cardiovascular: Negative for chest pain, palpitations and leg swelling.  Gastrointestinal: Negative for abdominal pain, blood in stool and nausea.  Genitourinary: Negative for dysuria and frequency.  Musculoskeletal: Positive for joint pain. Negative for falls.       Lateral right knee pain worse with rotation. No swelling or warmth or redness.  Skin: Negative for rash.  Neurological: Negative for dizziness, loss of consciousness and headaches.  Endo/Heme/Allergies: Negative for environmental allergies.  Psychiatric/Behavioral: Positive for depression. Negative for hallucinations and suicidal ideas.       Objective:    Physical Exam  BP (!) 119/56 (BP Location: Left Arm, Patient Position: Sitting, Cuff Size: Normal)   Pulse 77   Temp 98.2 F (36.8 C) (Oral)   Wt 184 lb 6.4 oz (83.6 kg)   SpO2 99% Comment: RA  BMI 30.69 kg/m  Wt Readings from Last 3 Encounters:  03/27/17 184 lb 6.4 oz (83.6 kg)  02/06/17 170 lb  (77.1 kg)  01/09/17 171 lb 9.6 oz (77.8 kg)   BP Readings from Last 3 Encounters:  03/27/17 (!) 119/56  02/06/17 (!) 142/86  01/09/17 132/82     Immunization History  Administered Date(s) Administered  . Influenza Split 10/04/2013  . Influenza-Unspecified 09/14/2014, 09/08/2015  . Tdap 04/03/2009    Health Maintenance  Topic Date Due  . PAP SMEAR  07/28/1979  . INFLUENZA VACCINE  07/04/2017  . MAMMOGRAM  09/18/2018  . TETANUS/TDAP  04/04/2019  . COLONOSCOPY  11/13/2019  . Hepatitis  C Screening  Completed  . HIV Screening  Completed    Lab Results  Component Value Date   WBC 7.8 01/03/2017   HGB 13.3 01/03/2017   HCT 38.9 01/03/2017   PLT 316.0 01/03/2017   GLUCOSE 114 (H) 01/03/2017   CHOL 204 (H) 01/03/2017   TRIG 124.0 01/03/2017   HDL 59.90 01/03/2017   LDLDIRECT 96.0 07/28/2015   LDLCALC 119 (H) 01/03/2017   ALT 15 01/03/2017   AST 14 01/03/2017   NA 140 01/03/2017   K 4.3 01/03/2017   CL 106 01/03/2017   CREATININE 0.74 01/03/2017   BUN 17 01/03/2017   CO2 28 01/03/2017   TSH 1.39 01/03/2017   INR 1.0 11/09/2008   HGBA1C 5.9 01/03/2017    Lab Results  Component Value Date   TSH 1.39 01/03/2017   Lab Results  Component Value Date   WBC 7.8 01/03/2017   HGB 13.3 01/03/2017   HCT 38.9 01/03/2017   MCV 83.6 01/03/2017   PLT 316.0 01/03/2017   Lab Results  Component Value Date   NA 140 01/03/2017   K 4.3 01/03/2017   CO2 28 01/03/2017   GLUCOSE 114 (H) 01/03/2017   BUN 17 01/03/2017   CREATININE 0.74 01/03/2017   BILITOT 0.5 01/03/2017   ALKPHOS 86 01/03/2017   AST 14 01/03/2017   ALT 15 01/03/2017   PROT 7.6 01/03/2017   ALBUMIN 4.6 01/03/2017   CALCIUM 9.8 01/03/2017   GFR 85.54 01/03/2017   Lab Results  Component Value Date   CHOL 204 (H) 01/03/2017   Lab Results  Component Value Date   HDL 59.90 01/03/2017   Lab Results  Component Value Date   LDLCALC 119 (H) 01/03/2017   Lab Results  Component Value Date   TRIG 124.0  01/03/2017   Lab Results  Component Value Date   CHOLHDL 3 01/03/2017   Lab Results  Component Value Date   HGBA1C 5.9 01/03/2017         Assessment & Plan:   Problem List Items Addressed This Visit    Tachycardia    RRR today      Depression with anxiety    Continues to struggle is still caring for 2 young family members since their mother is in rehab. This is causing significant stress but she feels the Buspar is helping. She agrees to proceed with behavioral health but has lost the phone number. Will increase her Buspar to 15 mg qid prn and she will let us know if any new concerns surface      Obesity    Encouraged DASH diet, decrease po intake and increase exercise as tolerated. Needs 7-8 hours of sleep nightly. Avoid trans fats, eat small, frequent meals every 4-5 hours with lean proteins, complex carbs and healthy fats. Minimize simple carbs, encouraged to proceed with bariatric referral      Hyperglycemia    hgba1c acceptable, minimize simple carbs. Increase exercise as tolerated.       Vitamin D deficiency    Continue daily supplements and monitor       Hair loss    Start zinc, fatty acids, biotin and increased protein and fluid intake.       Acute pain of right knee - Primary    Encouraged ice, topical lidocaine and referred to sports med for further work up and consideration.       Relevant Orders   Ambulatory referral to Sports Medicine      I have changed Ms.  Loisel's busPIRone. I am also having her maintain her aspirin, Vitamin D3, Fish Oil, albuterol, beclomethasone, metoprolol succinate, LORazepam, lamoTRIgine, and temazepam.  Meds ordered this encounter  Medications  . temazepam (RESTORIL) 30 MG capsule    Sig: Take 1 capsule (30 mg total) by mouth at bedtime.    Dispense:  30 capsule    Refill:  2  . busPIRone (BUSPAR) 15 MG tablet    Sig: Take 1 tablet (15 mg total) by mouth 4 (four) times daily as needed.    Dispense:  120 tablet     Refill:  1    CMA served as scribe during this visit. History, Physical and Plan performed by medical provider. Documentation and orders reviewed and attested to.  Penni Homans, MD

## 2017-03-27 NOTE — Progress Notes (Signed)
Pre visit review using our clinic review tool, if applicable. No additional management support is needed unless otherwise documented below in the visit note. 

## 2017-03-28 ENCOUNTER — Encounter: Payer: Self-pay | Admitting: Family Medicine

## 2017-03-28 DIAGNOSIS — L659 Nonscarring hair loss, unspecified: Secondary | ICD-10-CM | POA: Insufficient documentation

## 2017-03-28 DIAGNOSIS — M25561 Pain in right knee: Secondary | ICD-10-CM | POA: Insufficient documentation

## 2017-03-28 HISTORY — DX: Nonscarring hair loss, unspecified: L65.9

## 2017-03-28 NOTE — Assessment & Plan Note (Signed)
Start zinc, fatty acids, biotin and increased protein and fluid intake.

## 2017-03-28 NOTE — Assessment & Plan Note (Signed)
hgba1c acceptable, minimize simple carbs. Increase exercise as tolerated.  

## 2017-03-28 NOTE — Assessment & Plan Note (Signed)
Continues to struggle is still caring for 2 young family members since their mother is in rehab. This is causing significant stress but she feels the Buspar is helping. She agrees to proceed with behavioral health but has lost the phone number. Will increase her Buspar to 15 mg qid prn and she will let us know if any new concerns surface

## 2017-03-28 NOTE — Assessment & Plan Note (Signed)
RRR today 

## 2017-03-28 NOTE — Assessment & Plan Note (Signed)
Encouraged DASH diet, decrease po intake and increase exercise as tolerated. Needs 7-8 hours of sleep nightly. Avoid trans fats, eat small, frequent meals every 4-5 hours with lean proteins, complex carbs and healthy fats. Minimize simple carbs, encouraged to proceed with bariatric referral

## 2017-03-28 NOTE — Assessment & Plan Note (Signed)
Encouraged ice, topical lidocaine and referred to sports med for further work up and consideration.

## 2017-03-28 NOTE — Assessment & Plan Note (Signed)
Continue daily supplements and monitor

## 2017-04-06 ENCOUNTER — Other Ambulatory Visit: Payer: Self-pay | Admitting: Family Medicine

## 2017-04-06 MED FILL — METOPROLOL SUCC ER 50 MG TA: 50 | 90 days supply | Qty: 270 | Fill #2

## 2017-04-06 MED FILL — LORazepam 1 MG TABS: 1 | 23 days supply | Qty: 70 | Fill #2

## 2017-04-06 MED FILL — busPIRone HCL 15 MG TABS: 15 | 30 days supply | Qty: 120 | Fill #0

## 2017-04-06 MED FILL — lamoTRIgine 100 MG TABS: 100 | 30 days supply | Qty: 30 | Fill #0

## 2017-04-11 ENCOUNTER — Ambulatory Visit: Payer: 59 | Admitting: Sports Medicine

## 2017-04-16 MED FILL — TEMAZEPAM 30 MG CAPSULE: 30 | 30 days supply | Qty: 30 | Fill #0

## 2017-04-17 MED FILL — raNITIdine HCL 300 MG TABS: 300 | 90 days supply | Qty: 90 | Fill #2

## 2017-05-01 ENCOUNTER — Telehealth: Payer: Self-pay | Admitting: Family Medicine

## 2017-05-01 ENCOUNTER — Ambulatory Visit: Payer: 59 | Admitting: Family Medicine

## 2017-05-01 NOTE — Telephone Encounter (Signed)
Patient lvm cancelling 6pm appointment due to daughter spouse being in a motorcycle accident, charge or no charge

## 2017-05-01 NOTE — Telephone Encounter (Signed)
No charge. 

## 2017-05-16 ENCOUNTER — Other Ambulatory Visit: Payer: Self-pay | Admitting: Family Medicine

## 2017-05-16 MED FILL — lamoTRIgine 100 MG TABS: 100 | 30 days supply | Qty: 30 | Fill #1

## 2017-05-16 MED FILL — busPIRone HCL 15 MG TABS: 15 | 30 days supply | Qty: 120 | Fill #1

## 2017-05-16 MED FILL — TEMAZEPAM 30 MG CAPSULE: 30 | 30 days supply | Qty: 30 | Fill #1

## 2017-05-17 MED FILL — LORazepam 1 MG TABS: 1 | 23 days supply | Qty: 70 | Fill #0

## 2017-05-17 NOTE — Telephone Encounter (Signed)
Requesting:  lorazepam Contract   none UDS   none Last OV    03/27/2017 Last Refill    #70 with 2 refills on 02/01/2017  Please Advise

## 2017-05-17 NOTE — Telephone Encounter (Signed)
Faxed hardcopy to [facility]

## 2017-06-04 MED FILL — MAGIC MW LID/MAAL/DP1:1:1: 2 | 6 days supply | Qty: 240 | Fill #0

## 2017-06-18 ENCOUNTER — Other Ambulatory Visit: Payer: Self-pay | Admitting: Family Medicine

## 2017-06-18 MED FILL — TEMAZEPAM 30 MG CAPSULE: 30 | 30 days supply | Qty: 30 | Fill #2

## 2017-06-18 MED FILL — busPIRone HCL 15 MG TABS: 15 | 30 days supply | Qty: 120 | Fill #0

## 2017-06-18 MED FILL — LORazepam 1 MG TABS: 1 | 23 days supply | Qty: 70 | Fill #1

## 2017-07-05 MED FILL — METOPROLOL SUCC ER 50 MG TA: 50 | 90 days supply | Qty: 270 | Fill #3

## 2017-07-16 ENCOUNTER — Other Ambulatory Visit: Payer: Self-pay | Admitting: Family Medicine

## 2017-07-16 ENCOUNTER — Other Ambulatory Visit: Payer: Self-pay

## 2017-07-16 MED ORDER — METOPROLOL SUCCINATE ER 50 MG PO TB24: ORAL_TABLET | ORAL | 1 refills | Status: DC

## 2017-07-16 MED FILL — LORazepam 1 MG TABS: 1 | 23 days supply | Qty: 70 | Fill #2

## 2017-07-16 NOTE — Telephone Encounter (Signed)
error 

## 2017-07-16 NOTE — Telephone Encounter (Signed)
Called patient and advised about message below she has agreed. Also she scheduled a follow up w/ PCP on 07/19/17.   PC

## 2017-07-16 NOTE — Telephone Encounter (Signed)
OK to refill requested med but needs UDS and contract and an appt by the end of October to keep up refills

## 2017-07-16 NOTE — Telephone Encounter (Signed)
[facility] refill request for Temazepam  Last filled per database:  06/18/17 Last written: 03/27/17 #30 w/ 2rf Last ov: 03/27/17 Next ov: nothing scheduled Contract: none UDS: none

## 2017-07-18 ENCOUNTER — Other Ambulatory Visit: Payer: Self-pay | Admitting: Family Medicine

## 2017-07-19 ENCOUNTER — Ambulatory Visit (INDEPENDENT_AMBULATORY_CARE_PROVIDER_SITE_OTHER): Payer: 59 | Admitting: Family Medicine

## 2017-07-19 ENCOUNTER — Other Ambulatory Visit: Payer: Self-pay | Admitting: Family Medicine

## 2017-07-19 ENCOUNTER — Encounter: Payer: Self-pay | Admitting: Family Medicine

## 2017-07-19 VITALS — BP 130/82 | HR 113 | Temp 98.7°F | Resp 18 | Wt 192.4 lb

## 2017-07-19 DIAGNOSIS — E782 Mixed hyperlipidemia: Secondary | ICD-10-CM

## 2017-07-19 DIAGNOSIS — E559 Vitamin D deficiency, unspecified: Secondary | ICD-10-CM | POA: Diagnosis not present

## 2017-07-19 DIAGNOSIS — E6609 Other obesity due to excess calories: Secondary | ICD-10-CM | POA: Diagnosis not present

## 2017-07-19 DIAGNOSIS — R000 Tachycardia, unspecified: Secondary | ICD-10-CM | POA: Diagnosis not present

## 2017-07-19 DIAGNOSIS — K219 Gastro-esophageal reflux disease without esophagitis: Secondary | ICD-10-CM | POA: Diagnosis not present

## 2017-07-19 DIAGNOSIS — R739 Hyperglycemia, unspecified: Secondary | ICD-10-CM

## 2017-07-19 DIAGNOSIS — Z79899 Other long term (current) drug therapy: Secondary | ICD-10-CM | POA: Diagnosis not present

## 2017-07-19 DIAGNOSIS — R002 Palpitations: Secondary | ICD-10-CM | POA: Diagnosis not present

## 2017-07-19 DIAGNOSIS — F418 Other specified anxiety disorders: Secondary | ICD-10-CM | POA: Diagnosis not present

## 2017-07-19 MED ORDER — TRAZODONE HCL 50 MG PO TABS: 25.0000 mg | ORAL_TABLET | Freq: Every evening | ORAL | 3 refills | Status: DC | PRN

## 2017-07-19 MED ORDER — METOPROLOL SUCCINATE ER 100 MG PO TB24: 100.0000 mg | ORAL_TABLET | Freq: Every day | ORAL | 3 refills | Status: DC

## 2017-07-19 MED ORDER — OMEPRAZOLE 40 MG PO CPDR: 40.0000 mg | DELAYED_RELEASE_CAPSULE | Freq: Every day | ORAL | 3 refills | Status: DC

## 2017-07-19 MED FILL — OMEPRAZOLE DR 40 MG CAPSULE: 40 | 30 days supply | Qty: 30 | Fill #0 | Status: TO

## 2017-07-19 MED FILL — TEMAZEPAM 30 MG CAPSULE: 30 | 30 days supply | Qty: 30 | Fill #0

## 2017-07-19 MED FILL — traZODone HCL 50 MG TABS: 50 | 30 days supply | Qty: 30 | Fill #0

## 2017-07-19 NOTE — Progress Notes (Signed)
Subjective:  I acted as a Education administrator for Dr. Charlett Blake. [name], [place]  Patient ID: [name] Jensen, female    DOB: 18-Aug-1956, 61 y.o.   MRN: [id number]  No chief complaint on file.   HPI  Patient is in today for a follow up and is struggling with severe anxiety and depression. Her daughter's long term boyfriend suffered a severe brain injury and they have split, all of this plus her ongoing stressors she is overwhelmed once more. She endorses anhedonia but not suicidal ideation. Denies CP/SOB/HA/congestion/fevers/GI or GU c/o. Taking meds as prescribed  Patient Care Team: [name], MD as PCP - General (Family Medicine)   Past Medical History:  Diagnosis Date  . Adjustment reaction with anxiety and depression 06/12/2014  . Anemia    h/o low ferritin  . Anxiety   . Asthma    environmental triggers  . Chicken pox as a child  . Cough 08/22/2015  . Depression   . Depression with anxiety 06/12/2014  . Diverticulosis   . Ehrlichiosis 9326  . Gallstones   . GERD (gastroesophageal reflux disease)   . Hair loss 03/28/2017  . Hiatal hernia 2007  . History of viral illness   . Hyperlipidemia, mixed 04/11/2015  . Lactose intolerance 06/12/2014  . Mumps as a child  . Overweight 08/19/2014  . Rectocele 06/12/2014  . RLS (restless legs syndrome)   . 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 Sessile colonic polyp   . SVT (supraventricular tachycardia) ([place])   . Tachycardia 06/12/2014  . Thyroid disease   . Vitamin D deficiency 08/22/2015    Past Surgical History:  Procedure Laterality Date  . ABDOMINAL HYSTERECTOMY  11-2008   total with cystocele, rectocele repair  . APPENDECTOMY    . CHOLECYSTECTOMY    . COLONOSCOPY  2006   diverticulosis, hemorrhoids ([place])  . ESOPHAGOGASTRODUODENOSCOPY  2006   small hiatus hernia ([place])  . INCONTINENCE SURGERY    . SKIN SURGERY     right arm scc  . svt ablation    . TONSILLECTOMY  1972   2ith adenoid  . WISDOM  TOOTH EXTRACTION  61 yrs old    Family History  Problem Relation Age of Onset  . Hypertension Mother   . Hypertension Father   . Hypertension Sister   . Diabetes Sister        type 2  . Fibromyalgia Sister   . GI Bleed Maternal Grandmother   . Atrial fibrillation Maternal Grandmother   . Heart disease Maternal Grandfather   . Diabetes Paternal Grandmother   . Stroke Paternal Grandfather   . Proteinuria Sister     Social History   Social History  . Marital status: Divorced    Spouse name: N/A  . Number of children: 2  . Years of education: N/A   Occupational History  . RN [employer]   Social History Main Topics  . Smoking status: Never Smoker  . Smokeless tobacco: Never Used  . Alcohol use No  . Drug use: No  . Sexual activity: Not on file     Comment: lives by self with 4 dogs works at [employer], avoids dairy   Other Topics Concern  . Not on file   Social History Narrative  . No narrative on file    Outpatient Medications Prior to Visit  Medication Sig Dispense Refill  . albuterol (PROVENTIL HFA;VENTOLIN HFA) 108 (90 Base) MCG/ACT inhaler Inhale 2 puffs into  the lungs every 6 (six) hours as needed for wheezing. 1 Inhaler 6  . aspirin (ASPIRIN EC) 81 MG EC tablet Take 81 mg by mouth at bedtime.     . beclomethasone (QVAR) 80 MCG/ACT inhaler Inhale 1 puff into the lungs daily. 1 Inhaler 6  . Cholecalciferol (VITAMIN D3) 2000 units TABS Take 1 tablet by mouth daily.    Marland Kitchen LORazepam (ATIVAN) 1 MG tablet TAKE 1/2 TO 1 TABLET BY MOUTH EVERY 8 HOURS AS NEEDED FOR ANXIETY 70 tablet 2  . Omega-3 Fatty Acids (FISH OIL) 1000 MG CAPS Take 2 capsules by mouth 2 (two) times daily.    . temazepam (RESTORIL) 30 MG capsule TAKE 1 CAPSULE BY MOUTH AT BEDTIME 30 capsule 2  . busPIRone (BUSPAR) 15 MG tablet TAKE 1 TABLET (15 MG TOTAL) BY MOUTH 4 (FOUR) TIMES DAILY AS NEEDED. 120 tablet 1  . lamoTRIgine (LAMICTAL) 100 MG tablet TAKE 1 TABLET BY MOUTH DAILY. 30 tablet 1  .  metoprolol succinate (TOPROL-XL) 50 MG 24 hr tablet Take 50 mg in Am and Take 100 mg in the PM 270 tablet 1   No facility-administered medications prior to visit.     Allergies  Allergen Reactions  . Amitriptyline     hallucinations  . Dilaudid [Hydromorphone Hcl]     Respiratory , cardiac   . Effexor [Venlafaxine] Other (See Comments)    myalgia  . Abilify [Aripiprazole] Palpitations    Review of Systems  Constitutional: Negative for fever and malaise/fatigue.  HENT: Negative for congestion.   Eyes: Negative for blurred vision.  Respiratory: Negative for cough and shortness of breath.   Cardiovascular: Negative for chest pain, palpitations and leg swelling.  Gastrointestinal: Negative for vomiting.  Musculoskeletal: Negative for back pain.  Skin: Negative for rash.  Neurological: Negative for loss of consciousness and headaches.  Psychiatric/Behavioral: Positive for depression. Negative for hallucinations. The patient is nervous/anxious and has insomnia.        Objective:    Physical Exam  Constitutional: She is oriented to person, place, and time. She appears well-developed and well-nourished. No distress.  HENT:  Head: Normocephalic and atraumatic.  Eyes: Conjunctivae are normal.  Neck: Normal range of motion. No thyromegaly present.  Cardiovascular: Normal rate and regular rhythm.   Pulmonary/Chest: Effort normal and breath sounds normal. She has no wheezes.  Abdominal: Soft. Bowel sounds are normal. There is no tenderness.  Musculoskeletal: Normal range of motion. She exhibits no edema or deformity.  Neurological: She is alert and oriented to person, place, and time.  Skin: Skin is warm and dry. She is not diaphoretic.  Psychiatric: She has a normal mood and affect.    BP 130/82 (BP Location: Left Arm, Patient Position: Sitting, Cuff Size: Normal)   Pulse (!) 113   Temp 98.7 F (37.1 C) (Oral)   Resp 18   Wt 192 lb 6.4 oz (87.3 kg)   SpO2 97%   BMI 32.02  kg/m  Wt Readings from Last 3 Encounters:  07/19/17 192 lb 6.4 oz (87.3 kg)  03/27/17 184 lb 6.4 oz (83.6 kg)  02/06/17 170 lb (77.1 kg)   BP Readings from Last 3 Encounters:  07/19/17 130/82  03/27/17 (!) 119/56  02/06/17 (!) 142/86     Immunization History  Administered Date(s) Administered  . Influenza Split 10/04/2013  . Influenza-Unspecified 09/14/2014, 09/08/2015  . Tdap 04/03/2009    Health Maintenance  Topic Date Due  . PAP SMEAR  07/28/1979  . INFLUENZA VACCINE  07/04/2017  . MAMMOGRAM  09/18/2018  . TETANUS/TDAP  04/04/2019  . COLONOSCOPY  11/13/2019  . Hepatitis C Screening  Completed  . HIV Screening  Completed    Lab Results  Component Value Date   WBC 7.9 07/19/2017   HGB 13.8 07/19/2017   HCT 41.2 07/19/2017   PLT 334.0 07/19/2017   GLUCOSE 89 07/19/2017   CHOL 240 (H) 07/19/2017   TRIG 219.0 (H) 07/19/2017   HDL 60.30 07/19/2017   LDLDIRECT 145.0 07/19/2017   LDLCALC 119 (H) 01/03/2017   ALT 20 07/19/2017   AST 17 07/19/2017   NA 140 07/19/2017   K 3.9 07/19/2017   CL 103 07/19/2017   CREATININE 0.68 07/19/2017   BUN 13 07/19/2017   CO2 26 07/19/2017   TSH 1.33 07/19/2017   INR 1.0 11/09/2008   HGBA1C 5.9 01/03/2017    Lab Results  Component Value Date   TSH 1.33 07/19/2017   Lab Results  Component Value Date   WBC 7.9 07/19/2017   HGB 13.8 07/19/2017   HCT 41.2 07/19/2017   MCV 86.0 07/19/2017   PLT 334.0 07/19/2017   Lab Results  Component Value Date   NA 140 07/19/2017   K 3.9 07/19/2017   CO2 26 07/19/2017   GLUCOSE 89 07/19/2017   BUN 13 07/19/2017   CREATININE 0.68 07/19/2017   BILITOT 0.5 07/19/2017   ALKPHOS 69 07/19/2017   AST 17 07/19/2017   ALT 20 07/19/2017   PROT 7.2 07/19/2017   ALBUMIN 4.3 07/19/2017   CALCIUM 9.6 07/19/2017   GFR 94.13 07/19/2017   Lab Results  Component Value Date   CHOL 240 (H) 07/19/2017   Lab Results  Component Value Date   HDL 60.30 07/19/2017   Lab Results  Component  Value Date   LDLCALC 119 (H) 01/03/2017   Lab Results  Component Value Date   TRIG 219.0 (H) 07/19/2017   Lab Results  Component Value Date   CHOLHDL 4 07/19/2017   Lab Results  Component Value Date   HGBA1C 5.9 01/03/2017         Assessment & Plan:   Problem List Items Addressed This Visit    Tachycardia    Very stressed and tachycardic today. Increase Metoprolol XR to 100 mg po bid.       Relevant Orders   CBC (Completed)   Comprehensive metabolic panel (Completed)   T4, free (Completed)   TSH (Completed)   Depression with anxiety    Her daughter's long term boyfriend suffered a severe brain injury and they have split, all of this plus her ongoing stressors she is overwhelmed once more. Spent 25 minutes of a 30 minute visit in counseling. She is referred to psychiatry, she has used Trazodone in the past with decent results will try this at 50 mg daily      Relevant Orders   Ambulatory referral to Psychiatry   Ambulatory referral to [facility]   GERD (gastroesophageal reflux disease)    Avoid offending foods, start probiotics. Do not eat large meals in late evening and consider raising head of bed. Start Omeprazole 40 mg daily continue Ranitidine      Relevant Medications   ranitidine (ZANTAC) 300 MG tablet   omeprazole (PRILOSEC) 40 MG capsule   Obesity    Encouraged DASH diet, decrease po intake and increase exercise as tolerated. Needs 7-8 hours of sleep nightly. Avoid trans fats, eat small, frequent meals every 4-5 hours with lean proteins, complex carbs and  healthy fats. Minimize simple carbs, GMO foods.      Hyperglycemia   Vitamin D deficiency    Taking daily supplements       Other Visit Diagnoses    Palpitations    -  Primary   Mixed hyperlipidemia       Relevant Medications   metoprolol succinate (TOPROL-XL) 100 MG 24 hr tablet   Other Relevant Orders   Lipid panel (Completed)      I have discontinued Ms. Mataya's lamoTRIgine,  busPIRone, and metoprolol succinate. I am also having her start on metoprolol succinate, omeprazole, and traZODone. Additionally, I am having her maintain her aspirin, Vitamin D3, Fish Oil, albuterol, beclomethasone, LORazepam, temazepam, and ranitidine.  Meds ordered this encounter  Medications  . ranitidine (ZANTAC) 300 MG tablet    Sig: Take 1 tablet by mouth daily.  . metoprolol succinate (TOPROL-XL) 100 MG 24 hr tablet    Sig: Take 1 tablet (100 mg total) by mouth daily. Take with or immediately following a meal.    Dispense:  90 tablet    Refill:  3  . omeprazole (PRILOSEC) 40 MG capsule    Sig: Take 1 capsule (40 mg total) by mouth daily.    Dispense:  30 capsule    Refill:  3  . traZODone (DESYREL) 50 MG tablet    Sig: Take 0.5-1 tablets (25-50 mg total) by mouth at bedtime as needed for sleep.    Dispense:  30 tablet    Refill:  3    CMA served as scribe during this visit. History, Physical and Plan performed by medical provider. Documentation and orders reviewed and attested to.  Penni Homans, MD

## 2017-07-19 NOTE — Assessment & Plan Note (Signed)
Avoid offending foods, start probiotics. Do not eat large meals in late evening and consider raising head of bed. Start Omeprazole 40 mg daily continue Ranitidine

## 2017-07-19 NOTE — Assessment & Plan Note (Signed)
Encouraged DASH diet, decrease po intake and increase exercise as tolerated. Needs 7-8 hours of sleep nightly. Avoid trans fats, eat small, frequent meals every 4-5 hours with lean proteins, complex carbs and healthy fats. Minimize simple carbs, GMO foods. 

## 2017-07-19 NOTE — Patient Instructions (Signed)

## 2017-07-19 NOTE — Assessment & Plan Note (Signed)
Taking daily supplements

## 2017-07-20 ENCOUNTER — Telehealth: Payer: Self-pay | Admitting: Family Medicine

## 2017-07-20 LAB — CBC
HEMATOCRIT: 41.2 % (ref 36.0–46.0)
Hemoglobin: 13.8 g/dL (ref 12.0–15.0)
MCHC: 33.4 g/dL (ref 30.0–36.0)
MCV: 86 fl (ref 78.0–100.0)
Platelets: 334 10*3/uL (ref 150.0–400.0)
RBC: 4.79 Mil/uL (ref 3.87–5.11)
RDW: 13.1 % (ref 11.5–15.5)
WBC: 7.9 10*3/uL (ref 4.0–10.5)

## 2017-07-20 LAB — COMPREHENSIVE METABOLIC PANEL
ALBUMIN: 4.3 g/dL (ref 3.5–5.2)
ALK PHOS: 69 U/L (ref 39–117)
ALT: 20 U/L (ref 0–35)
AST: 17 U/L (ref 0–37)
BUN: 13 mg/dL (ref 6–23)
CHLORIDE: 103 meq/L (ref 96–112)
CO2: 26 mEq/L (ref 19–32)
CREATININE: 0.68 mg/dL (ref 0.40–1.20)
Calcium: 9.6 mg/dL (ref 8.4–10.5)
GFR: 94.13 mL/min (ref >60.00)
Glucose, Bld: 89 mg/dL (ref 70–99)
Potassium: 3.9 mEq/L (ref 3.5–5.1)
SODIUM: 140 meq/L (ref 135–145)
TOTAL PROTEIN: 7.2 g/dL (ref 6.0–8.3)
Total Bilirubin: 0.5 mg/dL (ref 0.2–1.2)

## 2017-07-20 LAB — LIPID PANEL
CHOL/HDL RATIO: 4
CHOLESTEROL: 240 mg/dL — AB (ref 0–200)
HDL: 60.3 mg/dL (ref >39.00)
NonHDL: 180.08
Triglycerides: 219 mg/dL — ABNORMAL HIGH (ref 0.0–149.0)
VLDL: 43.8 mg/dL — ABNORMAL HIGH (ref 0.0–40.0)

## 2017-07-20 LAB — TSH: TSH: 1.33 u[IU]/mL (ref 0.35–4.50)

## 2017-07-20 LAB — LDL CHOLESTEROL, DIRECT: LDL DIRECT: 145 mg/dL

## 2017-07-20 LAB — T4, FREE: Free T4: 0.96 ng/dL (ref 0.60–1.60)

## 2017-07-20 NOTE — Telephone Encounter (Signed)
Please advise on dosage.   pc

## 2017-07-20 NOTE — Telephone Encounter (Signed)
Caller name: Suezanne Jacquet with [facility] Can be reached: [phone]  Reason for call: Pt thought med dosing was going to be 2x per day on Metoprolol 100mg . Pt did not pick up. Please call Suezanne Jacquet and pt to advise or send new RX as needed.

## 2017-07-21 NOTE — Telephone Encounter (Signed)
Increase Metoporolol XR to 100 mg tabs 1 tab po bid disp #60 with 3 rf

## 2017-07-22 NOTE — Assessment & Plan Note (Signed)
Her daughter's long term boyfriend suffered a severe brain injury and they have split, all of this plus her ongoing stressors she is overwhelmed once more. Spent 25 minutes of a 30 minute visit in counseling. She is referred to psychiatry, she has used Trazodone in the past with decent results will try this at 50 mg daily

## 2017-07-22 NOTE — Assessment & Plan Note (Signed)
Very stressed and tachycardic today. Increase Metoprolol XR to 100 mg po bid.

## 2017-07-25 MED ORDER — METOPROLOL SUCCINATE ER 100 MG PO TB24: 100.0000 mg | ORAL_TABLET | Freq: Two times a day (BID) | ORAL | 3 refills | Status: DC

## 2017-07-25 NOTE — Telephone Encounter (Signed)
Ben with [facility] called in to follow up on prescription for pt. Showing that it was okay'd by provider but not showing sent to pharmacy.    Please assist further.

## 2017-07-25 NOTE — Telephone Encounter (Signed)
Sent in metoprolol

## 2017-07-30 ENCOUNTER — Encounter: Payer: Self-pay | Admitting: Family Medicine

## 2017-07-30 MED ORDER — TRAZODONE HCL 50 MG PO TABS: 75.0000 mg | ORAL_TABLET | Freq: Every evening | ORAL | 1 refills | Status: DC | PRN

## 2017-08-02 ENCOUNTER — Encounter: Payer: Self-pay | Admitting: Family Medicine

## 2017-08-07 ENCOUNTER — Encounter: Payer: Self-pay | Admitting: Family Medicine

## 2017-08-08 ENCOUNTER — Other Ambulatory Visit: Payer: Self-pay | Admitting: Family Medicine

## 2017-08-08 MED ORDER — TRAZODONE HCL 100 MG PO TABS: 100.0000 mg | ORAL_TABLET | Freq: Every day | ORAL | 3 refills | Status: DC

## 2017-08-10 ENCOUNTER — Other Ambulatory Visit: Payer: Self-pay

## 2017-08-10 MED ORDER — METOPROLOL SUCCINATE ER 100 MG PO TB24: ORAL_TABLET | ORAL | 0 refills | Status: DC

## 2017-08-10 NOTE — Telephone Encounter (Signed)
°  Pharmacy: [place], [place] - [address] [facility].  Reason for call:  patient states pharmacy contacted her stating only a 30 day supply was sent, daughter requested a 90 day supply metoprolol succinate (TOPROL-XL) 100 MG 24 hr tablet, please re send  [place], [place] - [address] [facility]

## 2017-08-10 NOTE — Telephone Encounter (Signed)
Per SB confirmed that pt is taking Metoprolol Suc 100mg  2bid/faxed #120 to [facility]/thx dmf

## 2017-08-13 ENCOUNTER — Other Ambulatory Visit: Payer: Self-pay | Admitting: Family Medicine

## 2017-08-13 MED FILL — traZODone HCL 100 MG TABS: 100 | 30 days supply | Qty: 30 | Fill #0

## 2017-08-14 MED FILL — LORazepam 1 MG TABS: 1 | 23 days supply | Qty: 70 | Fill #0

## 2017-08-14 NOTE — Telephone Encounter (Signed)
Requesting:ativan Contract:Yes UDS:low risk nxt scrn 01/19/18 Last OV:07/19/17 Next OV: 08/21/17 Last Refill:05/17/17 #[id number]   Please advise

## 2017-08-14 NOTE — Telephone Encounter (Signed)
Faxed medication to [place] [facility]

## 2017-08-16 MED FILL — TEMAZEPAM 30 MG CAPSULE: 30 | 30 days supply | Qty: 30 | Fill #0

## 2017-08-21 ENCOUNTER — Ambulatory Visit (INDEPENDENT_AMBULATORY_CARE_PROVIDER_SITE_OTHER): Payer: 59 | Admitting: Family Medicine

## 2017-08-21 ENCOUNTER — Encounter: Payer: Self-pay | Admitting: Family Medicine

## 2017-08-21 VITALS — BP 120/72 | HR 87 | Temp 98.4°F | Resp 18 | Wt 199.4 lb

## 2017-08-21 DIAGNOSIS — F418 Other specified anxiety disorders: Secondary | ICD-10-CM

## 2017-08-21 DIAGNOSIS — G4700 Insomnia, unspecified: Secondary | ICD-10-CM

## 2017-08-21 DIAGNOSIS — R739 Hyperglycemia, unspecified: Secondary | ICD-10-CM | POA: Diagnosis not present

## 2017-08-21 DIAGNOSIS — R000 Tachycardia, unspecified: Secondary | ICD-10-CM

## 2017-08-21 DIAGNOSIS — E6609 Other obesity due to excess calories: Secondary | ICD-10-CM

## 2017-08-21 DIAGNOSIS — E782 Mixed hyperlipidemia: Secondary | ICD-10-CM

## 2017-08-21 DIAGNOSIS — Z23 Encounter for immunization: Secondary | ICD-10-CM

## 2017-08-21 HISTORY — DX: Insomnia, unspecified: G47.00

## 2017-08-21 MED ORDER — TRAZODONE HCL 150 MG PO TABS: 150.0000 mg | ORAL_TABLET | Freq: Every day | ORAL | 1 refills | Status: DC

## 2017-08-21 NOTE — Assessment & Plan Note (Signed)
Encouraged heart healthy diet, increase exercise, avoid trans fats, consider a krill oil cap daily 

## 2017-08-21 NOTE — Progress Notes (Signed)
Subjective:  I acted as a Education administrator for Dr. Charlett Blake. [name], [place]  Patient ID: [name] Jensen, female    DOB: Apr 25, 1956, 61 y.o.   MRN: [id number]  No chief complaint on file.   HPI  Patient is in today for a 4 week follow up. Patient is following up on her palpitations. She notes the palpitations are lessened. The Trazodone helped initially then helped again when we increased it to 100 mg but she still wakes up at 4 am frequently. Her best friend was recently mauled to death by a pitt bull so she is very shaken up. She endorses anhedonia but no suicidal ideation. Denies CP/SOB/HA/congestion/fevers/GI or GU c/o. Taking meds as prescribed  Patient Care Team: [name], MD as PCP - General (Family Medicine)   Past Medical History:  Diagnosis Date  . Adjustment reaction with anxiety and depression 06/12/2014  . Anemia    h/o low ferritin  . Anxiety   . Asthma    environmental triggers  . Chicken pox as a child  . Cough 08/22/2015  . Depression   . Depression with anxiety 06/12/2014  . Diverticulosis   . Ehrlichiosis 5638  . Gallstones   . GERD (gastroesophageal reflux disease)   . Hair loss 03/28/2017  . Hiatal hernia 2007  . History of viral illness   . Hyperlipidemia, mixed 04/11/2015  . Insomnia 08/21/2017  . Lactose intolerance 06/12/2014  . Mumps as a child  . Overweight 08/19/2014  . Rectocele 06/12/2014  . RLS (restless legs syndrome)   . 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 Sessile colonic polyp   . SVT (supraventricular tachycardia) ([place])   . Tachycardia 06/12/2014  . Thyroid disease   . Vitamin D deficiency 08/22/2015    Past Surgical History:  Procedure Laterality Date  . ABDOMINAL HYSTERECTOMY  11-2008   total with cystocele, rectocele repair  . APPENDECTOMY    . CHOLECYSTECTOMY    . COLONOSCOPY  2006   diverticulosis, hemorrhoids ([place])  . ESOPHAGOGASTRODUODENOSCOPY  2006   small hiatus hernia ([place])  . INCONTINENCE  SURGERY    . SKIN SURGERY     right arm scc  . svt ablation    . TONSILLECTOMY  1972   2ith adenoid  . WISDOM TOOTH EXTRACTION  61 yrs old    Family History  Problem Relation Age of Onset  . Hypertension Mother   . Hypertension Father   . Hypertension Sister   . Diabetes Sister        type 2  . Fibromyalgia Sister   . GI Bleed Maternal Grandmother   . Atrial fibrillation Maternal Grandmother   . Heart disease Maternal Grandfather   . Diabetes Paternal Grandmother   . Stroke Paternal Grandfather   . Proteinuria Sister     Social History   Social History  . Marital status: Divorced    Spouse name: N/A  . Number of children: 2  . Years of education: N/A   Occupational History  . RN [employer]   Social History Main Topics  . Smoking status: Never Smoker  . Smokeless tobacco: Never Used  . Alcohol use No  . Drug use: No  . Sexual activity: Not on file     Comment: lives by self with 4 dogs works at [employer], avoids dairy   Other Topics Concern  . Not on file   Social History Narrative  . No narrative on file  Outpatient Medications Prior to Visit  Medication Sig Dispense Refill  . albuterol (PROVENTIL HFA;VENTOLIN HFA) 108 (90 Base) MCG/ACT inhaler Inhale 2 puffs into the lungs every 6 (six) hours as needed for wheezing. 1 Inhaler 6  . aspirin (ASPIRIN EC) 81 MG EC tablet Take 81 mg by mouth at bedtime.     . beclomethasone (QVAR) 80 MCG/ACT inhaler Inhale 1 puff into the lungs daily. 1 Inhaler 6  . Cholecalciferol (VITAMIN D3) 2000 units TABS Take 1 tablet by mouth daily.    Marland Kitchen LORazepam (ATIVAN) 1 MG tablet TAKE 1/2 TO 1 TABLET BY MOUTH EVERY 8 HOURS AS NEEDED FOR ANXIETY 70 tablet 2  . metoprolol succinate (TOPROL-XL) 100 MG 24 hr tablet Take 2 tablets BID 120 tablet 0  . Omega-3 Fatty Acids (FISH OIL) 1000 MG CAPS Take 2 capsules by mouth 2 (two) times daily.    Marland Kitchen omeprazole (PRILOSEC) 40 MG capsule Take 1 capsule (40 mg total) by mouth daily. 30 capsule  3  . ranitidine (ZANTAC) 300 MG tablet Take 1 tablet by mouth daily.    . temazepam (RESTORIL) 30 MG capsule TAKE 1 CAPSULE BY MOUTH AT BEDTIME 30 capsule 2  . traZODone (DESYREL) 100 MG tablet Take 1 tablet (100 mg total) by mouth at bedtime. 30 tablet 3   No facility-administered medications prior to visit.     Allergies  Allergen Reactions  . Amitriptyline     hallucinations  . Dilaudid [Hydromorphone Hcl]     Respiratory , cardiac   . Effexor [Venlafaxine] Other (See Comments)    myalgia  . Abilify [Aripiprazole] Palpitations    Review of Systems  Constitutional: Positive for malaise/fatigue. Negative for fever.  HENT: Negative for congestion.   Eyes: Negative for blurred vision.  Respiratory: Negative.  Negative for cough and shortness of breath.   Cardiovascular: Negative for chest pain, palpitations and leg swelling.  Gastrointestinal: Negative for abdominal pain, blood in stool, nausea and vomiting.  Genitourinary: Negative for dysuria and frequency.  Musculoskeletal: Negative for back pain and falls.  Skin: Negative for rash.  Neurological: Negative for dizziness, loss of consciousness and headaches.  Endo/Heme/Allergies: Negative for environmental allergies.  Psychiatric/Behavioral: Positive for depression. The patient is nervous/anxious and has insomnia.        Objective:    Physical Exam  Constitutional: She is oriented to person, place, and time. She appears well-developed and well-nourished. No distress.  HENT:  Head: Normocephalic and atraumatic.  Eyes: Conjunctivae are normal.  Neck: Normal range of motion. No thyromegaly present.  Cardiovascular: Normal rate and regular rhythm.   Pulmonary/Chest: Effort normal and breath sounds normal. She has no wheezes.  Abdominal: Soft. Bowel sounds are normal. There is no tenderness.  Musculoskeletal: Normal range of motion. She exhibits no edema or deformity.  Neurological: She is alert and oriented to person,  place, and time.  Skin: Skin is warm and dry. She is not diaphoretic.  Psychiatric: She has a normal mood and affect.    BP 120/72 (BP Location: Left Arm, Patient Position: Sitting, Cuff Size: Normal)   Pulse 87   Temp 98.4 F (36.9 C) (Oral)   Resp 18   Wt 199 lb 6.4 oz (90.4 kg)   SpO2 98%   BMI 33.18 kg/m  Wt Readings from Last 3 Encounters:  08/21/17 199 lb 6.4 oz (90.4 kg)  07/19/17 192 lb 6.4 oz (87.3 kg)  03/27/17 184 lb 6.4 oz (83.6 kg)   BP Readings from Last 3 Encounters:  08/21/17 120/72  07/19/17 130/82  03/27/17 (!) 119/56     Immunization History  Administered Date(s) Administered  . Influenza Split 10/04/2013  . Influenza,inj,Quad PF,6+ Mos 08/21/2017  . Influenza-Unspecified 09/14/2014, 09/08/2015  . Tdap 04/03/2009    Health Maintenance  Topic Date Due  . PAP SMEAR  07/28/1979  . MAMMOGRAM  09/18/2018  . TETANUS/TDAP  04/04/2019  . COLONOSCOPY  11/13/2019  . INFLUENZA VACCINE  Addressed  . Hepatitis C Screening  Completed  . HIV Screening  Completed    Lab Results  Component Value Date   WBC 7.9 08/22/2017   HGB 13.0 08/22/2017   HCT 39.1 08/22/2017   PLT 298.0 08/22/2017   GLUCOSE 118 (H) 08/22/2017   CHOL 225 (H) 08/22/2017   TRIG 215.0 (H) 08/22/2017   HDL 69.80 08/22/2017   LDLDIRECT 110.0 08/22/2017   LDLCALC 119 (H) 01/03/2017   ALT 24 08/22/2017   AST 20 08/22/2017   NA 141 08/22/2017   K 3.8 08/22/2017   CL 105 08/22/2017   CREATININE 0.77 08/22/2017   BUN 14 08/22/2017   CO2 28 08/22/2017   TSH 1.67 08/22/2017   INR 1.0 11/09/2008   HGBA1C 6.1 08/22/2017    Lab Results  Component Value Date   TSH 1.67 08/22/2017   Lab Results  Component Value Date   WBC 7.9 08/22/2017   HGB 13.0 08/22/2017   HCT 39.1 08/22/2017   MCV 85.0 08/22/2017   PLT 298.0 08/22/2017   Lab Results  Component Value Date   NA 141 08/22/2017   K 3.8 08/22/2017   CO2 28 08/22/2017   GLUCOSE 118 (H) 08/22/2017   BUN 14 08/22/2017    CREATININE 0.77 08/22/2017   BILITOT 0.4 08/22/2017   ALKPHOS 70 08/22/2017   AST 20 08/22/2017   ALT 24 08/22/2017   PROT 7.2 08/22/2017   ALBUMIN 4.2 08/22/2017   CALCIUM 9.5 08/22/2017   GFR 81.53 08/22/2017   Lab Results  Component Value Date   CHOL 225 (H) 08/22/2017   Lab Results  Component Value Date   HDL 69.80 08/22/2017   Lab Results  Component Value Date   LDLCALC 119 (H) 01/03/2017   Lab Results  Component Value Date   TRIG 215.0 (H) 08/22/2017   Lab Results  Component Value Date   CHOLHDL 3 08/22/2017   Lab Results  Component Value Date   HGBA1C 6.1 08/22/2017         Assessment & Plan:   Problem List Items Addressed This Visit    Tachycardia    RRR today      Relevant Orders   CBC (Completed)   TSH (Completed)   Depression with anxiety    Has been struggling with increased stress secondary to her best friend died after being mauled by her rescued Otilio Saber. She is managing better than expected.       Obesity    Encouraged DASH diet, decrease po intake and increase exercise as tolerated. Needs 7-8 hours of sleep nightly. Avoid trans fats, eat small, frequent meals every 4-5 hours with lean proteins, complex carbs and healthy fats. Minimize simple carbs.      Relevant Orders   CBC (Completed)   Comprehensive metabolic panel (Completed)   TSH (Completed)   Hyperlipidemia, mixed    Encouraged heart healthy diet, increase exercise, avoid trans fats, consider a krill oil cap daily      Relevant Orders   Lipid panel (Completed)   Hyperglycemia  hgba1c acceptable, minimize simple carbs. Increase exercise as tolerated.      Relevant Orders   Hemoglobin A1c (Completed)   Insomnia    Increase Trazodone to 150 mg qhs and assess the progress       Other Visit Diagnoses    Needs flu shot    -  Primary   Relevant Orders   Flu Vaccine QUAD 6+ mos PF IM (Fluarix Quad PF) (Completed)      I have discontinued Ms. Abair's temazepam and  traZODone. I am also having her start on traZODone. Additionally, I am having her maintain her aspirin, Vitamin D3, Fish Oil, albuterol, beclomethasone, ranitidine, omeprazole, metoprolol succinate, and LORazepam.  Meds ordered this encounter  Medications  . traZODone (DESYREL) 150 MG tablet    Sig: Take 1 tablet (150 mg total) by mouth at bedtime.    Dispense:  90 tablet    Refill:  1   CMA served as Education administrator during this visit. History, Physical and Plan performed by medical provider. Documentation and orders reviewed and attested to.   08/22/17 Penni Homans, MD

## 2017-08-21 NOTE — Assessment & Plan Note (Signed)
RRR today 

## 2017-08-21 NOTE — Patient Instructions (Signed)

## 2017-08-21 NOTE — Assessment & Plan Note (Signed)
Encouraged DASH diet, decrease po intake and increase exercise as tolerated. Needs 7-8 hours of sleep nightly. Avoid trans fats, eat small, frequent meals every 4-5 hours with lean proteins, complex carbs and healthy fats. Minimize simple carbs 

## 2017-08-21 NOTE — Assessment & Plan Note (Signed)
Has been struggling with increased stress secondary to her best friend died after being mauled by her rescued Otilio Saber. She is managing better than expected.

## 2017-08-21 NOTE — Assessment & Plan Note (Signed)
hgba1c acceptable, minimize simple carbs. Increase exercise as tolerated.  

## 2017-08-21 NOTE — Assessment & Plan Note (Signed)
Increase Trazodone to 150 mg qhs and assess the progress

## 2017-08-22 ENCOUNTER — Other Ambulatory Visit (INDEPENDENT_AMBULATORY_CARE_PROVIDER_SITE_OTHER): Payer: 59

## 2017-08-22 DIAGNOSIS — R000 Tachycardia, unspecified: Secondary | ICD-10-CM

## 2017-08-22 DIAGNOSIS — E782 Mixed hyperlipidemia: Secondary | ICD-10-CM | POA: Diagnosis not present

## 2017-08-22 DIAGNOSIS — R739 Hyperglycemia, unspecified: Secondary | ICD-10-CM

## 2017-08-22 DIAGNOSIS — E6609 Other obesity due to excess calories: Secondary | ICD-10-CM

## 2017-08-22 LAB — COMPREHENSIVE METABOLIC PANEL
ALT: 24 U/L (ref 0–35)
AST: 20 U/L (ref 0–37)
Albumin: 4.2 g/dL (ref 3.5–5.2)
Alkaline Phosphatase: 70 U/L (ref 39–117)
BUN: 14 mg/dL (ref 6–23)
CHLORIDE: 105 meq/L (ref 96–112)
CO2: 28 mEq/L (ref 19–32)
Calcium: 9.5 mg/dL (ref 8.4–10.5)
Creatinine, Ser: 0.77 mg/dL (ref 0.40–1.20)
GFR: 81.53 mL/min (ref >60.00)
GLUCOSE: 118 mg/dL — AB (ref 70–99)
POTASSIUM: 3.8 meq/L (ref 3.5–5.1)
SODIUM: 141 meq/L (ref 135–145)
Total Bilirubin: 0.4 mg/dL (ref 0.2–1.2)
Total Protein: 7.2 g/dL (ref 6.0–8.3)

## 2017-08-22 LAB — CBC
HEMATOCRIT: 39.1 % (ref 36.0–46.0)
Hemoglobin: 13 g/dL (ref 12.0–15.0)
MCHC: 33.3 g/dL (ref 30.0–36.0)
MCV: 85 fl (ref 78.0–100.0)
Platelets: 298 10*3/uL (ref 150.0–400.0)
RBC: 4.6 Mil/uL (ref 3.87–5.11)
RDW: 13.5 % (ref 11.5–15.5)
WBC: 7.9 10*3/uL (ref 4.0–10.5)

## 2017-08-22 LAB — LDL CHOLESTEROL, DIRECT: LDL DIRECT: 110 mg/dL

## 2017-08-22 LAB — LIPID PANEL
CHOL/HDL RATIO: 3
Cholesterol: 225 mg/dL — ABNORMAL HIGH (ref 0–200)
HDL: 69.8 mg/dL (ref >39.00)
NonHDL: 154.77
Triglycerides: 215 mg/dL — ABNORMAL HIGH (ref 0.0–149.0)
VLDL: 43 mg/dL — AB (ref 0.0–40.0)

## 2017-08-22 LAB — TSH: TSH: 1.67 u[IU]/mL (ref 0.35–4.50)

## 2017-08-22 LAB — HEMOGLOBIN A1C: HEMOGLOBIN A1C: 6.1 % (ref 4.6–6.5)

## 2017-08-22 MED FILL — traZODone HCL 150 MG TABS: 150 | 90 days supply | Qty: 90 | Fill #0

## 2017-08-27 ENCOUNTER — Other Ambulatory Visit: Payer: Self-pay | Admitting: Family Medicine

## 2017-08-27 MED FILL — OMEPRAZOLE DR 40 MG CAPSULE: 40 | 30 days supply | Qty: 30 | Fill #0

## 2017-08-27 MED FILL — raNITIdine HCL 300 MG TABS: 300 | 30 days supply | Qty: 30 | Fill #0

## 2017-08-28 ENCOUNTER — Encounter: Payer: Self-pay | Admitting: Family Medicine

## 2017-09-17 MED FILL — LORazepam 1 MG TABS: 1 | 23 days supply | Qty: 70 | Fill #1

## 2017-09-17 MED FILL — TEMAZEPAM 30 MG CAPSULE: 30 | 30 days supply | Qty: 30 | Fill #1

## 2017-09-18 ENCOUNTER — Other Ambulatory Visit: Payer: Self-pay | Admitting: Family Medicine

## 2017-09-18 DIAGNOSIS — Z1231 Encounter for screening mammogram for malignant neoplasm of breast: Secondary | ICD-10-CM

## 2017-09-24 ENCOUNTER — Ambulatory Visit (HOSPITAL_COMMUNITY): Payer: 59

## 2017-09-27 ENCOUNTER — Ambulatory Visit (HOSPITAL_COMMUNITY)
Admission: RE | Admit: 2017-09-27 | Discharge: 2017-09-27 | Disposition: A | Discharge status: Home / Self Care | Payer: 59 | Source: Ambulatory Visit | Attending: Family Medicine | Admitting: Family Medicine

## 2017-09-27 ENCOUNTER — Encounter (HOSPITAL_COMMUNITY): Payer: Self-pay

## 2017-09-27 DIAGNOSIS — Z1231 Encounter for screening mammogram for malignant neoplasm of breast: Secondary | ICD-10-CM

## 2017-10-02 ENCOUNTER — Ambulatory Visit (INDEPENDENT_AMBULATORY_CARE_PROVIDER_SITE_OTHER): Payer: 59 | Admitting: Family Medicine

## 2017-10-02 VITALS — BP 126/76 | HR 82 | Temp 98.1°F | Resp 18 | Wt 197.6 lb

## 2017-10-02 DIAGNOSIS — J45909 Unspecified asthma, uncomplicated: Secondary | ICD-10-CM

## 2017-10-02 DIAGNOSIS — Z0000 Encounter for general adult medical examination without abnormal findings: Secondary | ICD-10-CM

## 2017-10-02 DIAGNOSIS — E6609 Other obesity due to excess calories: Secondary | ICD-10-CM | POA: Diagnosis not present

## 2017-10-02 DIAGNOSIS — E782 Mixed hyperlipidemia: Secondary | ICD-10-CM | POA: Diagnosis not present

## 2017-10-02 DIAGNOSIS — F418 Other specified anxiety disorders: Secondary | ICD-10-CM

## 2017-10-02 DIAGNOSIS — E559 Vitamin D deficiency, unspecified: Secondary | ICD-10-CM

## 2017-10-02 DIAGNOSIS — R000 Tachycardia, unspecified: Secondary | ICD-10-CM

## 2017-10-02 DIAGNOSIS — R739 Hyperglycemia, unspecified: Secondary | ICD-10-CM | POA: Diagnosis not present

## 2017-10-02 MED ORDER — SPACER/AERO CHAMBER MOUTHPIECE MISC: 1 refills | Status: DC

## 2017-10-02 MED ORDER — TEMAZEPAM 30 MG PO CAPS: 30.0000 mg | ORAL_CAPSULE | Freq: Every day | ORAL | 2 refills | Status: DC

## 2017-10-02 MED ORDER — TRAZODONE HCL 150 MG PO TABS: 75.0000 mg | ORAL_TABLET | Freq: Every day | ORAL | 1 refills | Status: DC

## 2017-10-02 NOTE — Progress Notes (Signed)
Subjective:  I acted as a Education administrator for Dr. Charlett Blake. [name], [place]  Patient ID: [name] Jensen, female    DOB: [birth year], 61 y.o.   MRN: [id number]  No chief complaint on file.   HPI  Patient is in today for a 6 week follow up on chronic medical concerns such as obesity, depression anxiety, insomnia, hyperglycemia and hyperlipidemia. She feels well today. She felt the Trazodone was helping but at 150 mg daily dose she developed some tremor she dropped the dose and she is feeling better. She still struggles with insomnia and anhedonia but no suicidal ideaiton. Is frustrated with her weight and fatigue. No recent febrile illness or hospitalization. Denies CP/palp/SOB/HA/congestion/fevers/GI or GU c/o. Taking meds as prescribed  Patient Care Team: [name], MD as PCP - General (Family Medicine)   Past Medical History:  Diagnosis Date  . Adjustment reaction with anxiety and depression 06/12/2014  . Anemia    h/o low ferritin  . Anxiety   . Asthma    environmental triggers  . Chicken pox as a child  . Cough 08/22/2015  . Depression   . Depression with anxiety 06/12/2014  . Diverticulosis   . Ehrlichiosis 1191  . Gallstones   . GERD (gastroesophageal reflux disease)   . Hair loss 03/28/2017  . Hiatal hernia 2007  . History of viral illness   . Hyperlipidemia, mixed 04/11/2015  . Insomnia 08/21/2017  . Lactose intolerance 06/12/2014  . Mumps as a child  . Overweight 08/19/2014  . Rectocele 06/12/2014  . RLS (restless legs syndrome)   . 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 Sessile colonic polyp   . SVT (supraventricular tachycardia) ([place])   . Tachycardia 06/12/2014  . Thyroid disease   . Vitamin D deficiency 08/22/2015    Past Surgical History:  Procedure Laterality Date  . ABDOMINAL HYSTERECTOMY  11-2008   total with cystocele, rectocele repair  . APPENDECTOMY    . CHOLECYSTECTOMY    . COLONOSCOPY  2006   diverticulosis, hemorrhoids  ([place])  . ESOPHAGOGASTRODUODENOSCOPY  2006   small hiatus hernia ([place])  . INCONTINENCE SURGERY    . SKIN SURGERY     right arm scc  . svt ablation    . TONSILLECTOMY  1972   2ith adenoid  . WISDOM TOOTH EXTRACTION  61 yrs old    Family History  Problem Relation Age of Onset  . Hypertension Mother   . Hypertension Father   . Hypertension Sister   . Diabetes Sister        type 2  . Fibromyalgia Sister   . GI Bleed Maternal Grandmother   . Atrial fibrillation Maternal Grandmother   . Heart disease Maternal Grandfather   . Diabetes Paternal Grandmother   . Stroke Paternal Grandfather   . Proteinuria Sister     Social History   Socioeconomic History  . Marital status: Divorced    Spouse name: Not on file  . Number of children: 2  . Years of education: Not on file  . Highest education level: Not on file  Social Needs  . Financial resource strain: Not on file  . Food insecurity - worry: Not on file  . Food insecurity - inability: Not on file  . Transportation needs - medical: Not on file  . Transportation needs - non-medical: Not on file  Occupational History  . Occupation: Programmer, multimedia: [employer]  Tobacco Use  . Smoking status:  Never Smoker  . Smokeless tobacco: Never Used  Substance and Sexual Activity  . Alcohol use: No    Alcohol/week: 0.0 oz  . Drug use: No  . Sexual activity: Not on file    Comment: lives by self with 4 dogs works at [employer], avoids dairy  Other Topics Concern  . Not on file  Social History Narrative  . Not on file    Outpatient Medications Prior to Visit  Medication Sig Dispense Refill  . albuterol (PROVENTIL HFA;VENTOLIN HFA) 108 (90 Base) MCG/ACT inhaler Inhale 2 puffs into the lungs every 6 (six) hours as needed for wheezing. 1 Inhaler 6  . aspirin (ASPIRIN EC) 81 MG EC tablet Take 81 mg by mouth at bedtime.     . beclomethasone (QVAR) 80 MCG/ACT inhaler Inhale 1 puff into the lungs daily. 1 Inhaler 6  . Cholecalciferol  (VITAMIN D3) 2000 units TABS Take 1 tablet by mouth daily.    Marland Kitchen LORazepam (ATIVAN) 1 MG tablet TAKE 1/2 TO 1 TABLET BY MOUTH EVERY 8 HOURS AS NEEDED FOR ANXIETY 70 tablet 2  . metoprolol succinate (TOPROL-XL) 100 MG 24 hr tablet Take 2 tablets BID 120 tablet 0  . Omega-3 Fatty Acids (FISH OIL) 1000 MG CAPS Take 2 capsules by mouth 2 (two) times daily.    Marland Kitchen omeprazole (PRILOSEC) 40 MG capsule Take 1 capsule (40 mg total) by mouth daily. 30 capsule 3  . ranitidine (ZANTAC) 300 MG tablet TAKE 1 TABLET BY MOUTH ONCE DAILY AT BEDTIME AS NEEDED FOR HEARTBURN 30 tablet 1  . ranitidine (ZANTAC) 300 MG tablet Take 1 tablet by mouth daily.    . temazepam (RESTORIL) 30 MG capsule Take 30 mg by mouth at bedtime.  1  . traZODone (DESYREL) 150 MG tablet Take 1 tablet (150 mg total) by mouth at bedtime. 90 tablet 1   No facility-administered medications prior to visit.     Allergies  Allergen Reactions  . Amitriptyline     hallucinations  . Dilaudid [Hydromorphone Hcl]     Respiratory , cardiac   . Effexor [Venlafaxine] Other (See Comments)    myalgia  . Abilify [Aripiprazole] Palpitations    Review of Systems  Constitutional: Negative for fever and malaise/fatigue.  HENT: Negative for congestion.   Eyes: Negative for blurred vision.  Respiratory: Negative for shortness of breath.   Cardiovascular: Negative for chest pain, palpitations and leg swelling.  Gastrointestinal: Negative for abdominal pain, blood in stool and nausea.  Genitourinary: Negative for dysuria and frequency.  Musculoskeletal: Negative for falls.  Skin: Negative for rash.  Neurological: Positive for tremors. Negative for dizziness, loss of consciousness and headaches.  Endo/Heme/Allergies: Negative for environmental allergies.  Psychiatric/Behavioral: Positive for depression. The patient is nervous/anxious and has insomnia.        Objective:    Physical Exam  Constitutional: She is oriented to person, place, and time.  She appears well-developed and well-nourished. No distress.  HENT:  Head: Normocephalic and atraumatic.  Eyes: Conjunctivae are normal.  Neck: Neck supple. No thyromegaly present.  Cardiovascular: Normal rate, regular rhythm and normal heart sounds.  No murmur heard. Pulmonary/Chest: Effort normal and breath sounds normal. No respiratory distress.  Abdominal: Soft. Bowel sounds are normal. She exhibits no distension and no mass. There is no tenderness.  Musculoskeletal: She exhibits no edema.  Lymphadenopathy:    She has no cervical adenopathy.  Neurological: She is alert and oriented to person, place, and time.  Skin: Skin is warm and  dry.  Psychiatric: She has a normal mood and affect. Her behavior is normal.    BP 126/76 (BP Location: Left Arm, Patient Position: Sitting, Cuff Size: Normal)   Pulse 82   Temp 98.1 F (36.7 C) (Oral)   Resp 18   Wt 197 lb 9.6 oz (89.6 kg)   SpO2 96%   BMI 32.88 kg/m  Wt Readings from Last 3 Encounters:  10/02/17 197 lb 9.6 oz (89.6 kg)  08/21/17 199 lb 6.4 oz (90.4 kg)  07/19/17 192 lb 6.4 oz (87.3 kg)   BP Readings from Last 3 Encounters:  10/02/17 126/76  08/21/17 120/72  07/19/17 130/82     Immunization History  Administered Date(s) Administered  . Influenza Split 10/04/2013  . Influenza,inj,Quad PF,6+ Mos 08/21/2017  . Influenza-Unspecified 09/14/2014, 09/08/2015  . Tdap 04/03/2009    Health Maintenance  Topic Date Due  . PAP SMEAR  07/28/1979  . TETANUS/TDAP  04/04/2019  . MAMMOGRAM  09/28/2019  . COLONOSCOPY  11/13/2019  . INFLUENZA VACCINE  Completed  . Hepatitis C Screening  Completed  . HIV Screening  Completed    Lab Results  Component Value Date   WBC 7.9 08/22/2017   HGB 13.0 08/22/2017   HCT 39.1 08/22/2017   PLT 298.0 08/22/2017   GLUCOSE 118 (H) 08/22/2017   CHOL 225 (H) 08/22/2017   TRIG 215.0 (H) 08/22/2017   HDL 69.80 08/22/2017   LDLDIRECT 110.0 08/22/2017   LDLCALC 119 (H) 01/03/2017   ALT 24  08/22/2017   AST 20 08/22/2017   NA 141 08/22/2017   K 3.8 08/22/2017   CL 105 08/22/2017   CREATININE 0.77 08/22/2017   BUN 14 08/22/2017   CO2 28 08/22/2017   TSH 1.67 08/22/2017   INR 1.0 11/09/2008   HGBA1C 6.1 08/22/2017    Lab Results  Component Value Date   TSH 1.67 08/22/2017   Lab Results  Component Value Date   WBC 7.9 08/22/2017   HGB 13.0 08/22/2017   HCT 39.1 08/22/2017   MCV 85.0 08/22/2017   PLT 298.0 08/22/2017   Lab Results  Component Value Date   NA 141 08/22/2017   K 3.8 08/22/2017   CO2 28 08/22/2017   GLUCOSE 118 (H) 08/22/2017   BUN 14 08/22/2017   CREATININE 0.77 08/22/2017   BILITOT 0.4 08/22/2017   ALKPHOS 70 08/22/2017   AST 20 08/22/2017   ALT 24 08/22/2017   PROT 7.2 08/22/2017   ALBUMIN 4.2 08/22/2017   CALCIUM 9.5 08/22/2017   GFR 81.53 08/22/2017   Lab Results  Component Value Date   CHOL 225 (H) 08/22/2017   Lab Results  Component Value Date   HDL 69.80 08/22/2017   Lab Results  Component Value Date   LDLCALC 119 (H) 01/03/2017   Lab Results  Component Value Date   TRIG 215.0 (H) 08/22/2017   Lab Results  Component Value Date   CHOLHDL 3 08/22/2017   Lab Results  Component Value Date   HGBA1C 6.1 08/22/2017         Assessment & Plan:   Problem List Items Addressed This Visit    Tachycardia    RRR today      Depression with anxiety    Trazodone has been helpful but she believes the 150 mg caused some tremulousness she is better at 100 mg dose.       Relevant Medications   traZODone (DESYREL) 150 MG tablet   Preventative health care    Patient encouraged  to maintain heart healthy diet, regular exercise, adequate sleep. Consider daily probiotics. Take medications as prescribed. Labs reviewed. Colonoscopy in 2015 repeat in 2020      Obesity    Encouraged DASH diet, decrease po intake and increase exercise as tolerated. Needs 7-8 hours of sleep nightly. Avoid trans fats, eat small, frequent meals every  4-5 hours with lean proteins, complex carbs and healthy fats. Minimize simple carbs, she declines bariatric referral due to work schedule agrees to try [place]. Has failed Belviqx 1 week then increase  and Phentermine in past. Start Saxenda 0.6 mg [place] qd x 7 days then increase to 1.2 mg qd x 7 days. Then increase to 1.8 mg qd til next visit. Disp 30 day supply with 1 rf will likely require PA       Relevant Medications   Liraglutide -Weight Management (SAXENDA) 18 MG/3ML SOPN   Hyperlipidemia, mixed    Encouraged heart healthy diet, increase exercise, avoid trans fats, consider a krill oil cap daily      Hyperglycemia    minimize simple carbs. Increase exercise as tolerated.       Vitamin D deficiency    Encouraged Vitamin D daily       Other Visit Diagnoses    Reactive airway disease without complication, unspecified asthma severity, unspecified whether persistent    -  Primary   Relevant Medications   Spacer/Aero Chamber Mouthpiece MISC      I have changed Barnetta Chapel A. Kurt's temazepam and traZODone. I am also having her start on Spacer/Aero Chamber Mouthpiece and Liraglutide -Weight Management. Additionally, I am having her maintain her aspirin, Vitamin D3, Fish Oil, albuterol, beclomethasone, omeprazole, metoprolol succinate, LORazepam, and ranitidine.  Meds ordered this encounter  Medications  . DISCONTD: temazepam (RESTORIL) 30 MG capsule    Sig: Take 30 mg by mouth at bedtime.    Refill:  1  . temazepam (RESTORIL) 30 MG capsule    Sig: Take 1 capsule (30 mg total) by mouth at bedtime.    Dispense:  30 capsule    Refill:  2  . Spacer/Aero Chamber Mouthpiece MISC    Sig: Use with Albuterol HFA as directed    Dispense:  1 each    Refill:  1  . traZODone (DESYREL) 150 MG tablet    Sig: Take 0.5 tablets (75 mg total) by mouth at bedtime.    Dispense:  90 tablet    Refill:  1  . Liraglutide -Weight Management (SAXENDA) 18 MG/3ML SOPN    Sig: Inject 0.6 mg daily for 7  days into the skin, THEN 1.2 mg daily for 7 days, THEN 1.8 mg daily.    Dispense:  9 mL    Refill:  1    CMA served as scribe during this visit. History, Physical and Plan performed by medical provider. Documentation and orders reviewed and attested to.  Penni Homans, MD

## 2017-10-02 NOTE — Patient Instructions (Signed)

## 2017-10-07 MED ORDER — LIRAGLUTIDE -WEIGHT MANAGEMENT 18 MG/3ML ~~LOC~~ SOPN: PEN_INJECTOR | SUBCUTANEOUS | 1 refills | Status: AC

## 2017-10-07 NOTE — Assessment & Plan Note (Signed)
RRR today 

## 2017-10-07 NOTE — Assessment & Plan Note (Signed)
minimize simple carbs. Increase exercise as tolerated.  

## 2017-10-07 NOTE — Assessment & Plan Note (Signed)
Encouraged DASH diet, decrease po intake and increase exercise as tolerated. Needs 7-8 hours of sleep nightly. Avoid trans fats, eat small, frequent meals every 4-5 hours with lean proteins, complex carbs and healthy fats. Minimize simple carbs, she declines bariatric referral due to work schedule agrees to try [place]. Has failed Belviqx 1 week then increase  and Phentermine in past. Start Saxenda 0.6 mg [place] qd x 7 days then increase to 1.2 mg qd x 7 days. Then increase to 1.8 mg qd til next visit. Disp 30 day supply with 1 rf will likely require PA

## 2017-10-07 NOTE — Assessment & Plan Note (Signed)
Encouraged heart healthy diet, increase exercise, avoid trans fats, consider a krill oil cap daily 

## 2017-10-07 NOTE — Assessment & Plan Note (Signed)
Trazodone has been helpful but she believes the 150 mg caused some tremulousness she is better at 100 mg dose.

## 2017-10-07 NOTE — Assessment & Plan Note (Signed)
Patient encouraged to maintain heart healthy diet, regular exercise, adequate sleep. Consider daily probiotics. Take medications as prescribed. Labs reviewed. Colonoscopy in 2015 repeat in 2020

## 2017-10-07 NOTE — Assessment & Plan Note (Signed)
Encouraged Vitamin D daily

## 2017-10-08 ENCOUNTER — Telehealth: Payer: Self-pay

## 2017-10-08 MED FILL — METOPROLOL SUCC ER 100 MG T: 100 | 30 days supply | Qty: 120 | Fill #0

## 2017-10-08 MED FILL — raNITIdine HCL 300 MG TABS: 300 | 30 days supply | Qty: 30 | Fill #1

## 2017-10-08 NOTE — Telephone Encounter (Signed)
PA initiated via Covermymeds; KEY: [id number]. Awaiting determination.

## 2017-10-11 MED FILL — SAXENDA 18 MG/3 ML PEN: 18 | 50 days supply | Qty: 15 | Fill #0

## 2017-10-12 NOTE — Telephone Encounter (Signed)
PA approved effective for 12 refills from 10/11/2017 through 02/07/2018.

## 2017-10-16 MED FILL — TEMAZEPAM 30 MG CAPSULE: 30 | 30 days supply | Qty: 30 | Fill #0

## 2017-11-06 MED FILL — LORazepam 1 MG TABS: 1 | 23 days supply | Qty: 70 | Fill #2

## 2017-11-13 ENCOUNTER — Ambulatory Visit: Payer: 59 | Admitting: Family Medicine

## 2017-11-15 ENCOUNTER — Other Ambulatory Visit: Payer: Self-pay | Admitting: Family Medicine

## 2017-11-15 MED FILL — TEMAZEPAM 30 MG CAPSULE: 30 | 30 days supply | Qty: 30 | Fill #1

## 2017-11-20 MED FILL — raNITIdine HCL 300 MG TABS: 300 | 60 days supply | Qty: 60 | Fill #0

## 2017-11-20 MED FILL — SAXENDA 18 MG/3 ML PEN: 18 | 50 days supply | Qty: 15 | Fill #1

## 2017-11-22 ENCOUNTER — Telehealth: Payer: Self-pay | Admitting: Family Medicine

## 2017-11-22 NOTE — Telephone Encounter (Signed)
Copied from [place] [id number]. Topic: Quick Communication - Rx Refill/Question >> Nov 22, 2017  7:38 AM Synthia Innocent wrote: Has the patient contacted their pharmacy? Yes.     (Agent: If no, request that the patient contact the pharmacy for the refill.)   Preferred Pharmacy (with phone number or street name): [facility]   Agent: Please be advised that RX refills may take up to 3 business days. We ask that you follow-up with your pharmacy. Pharmacy did not receive script for needles for saxenda, generic liraglutide. Unable to take med without needle. Need asap

## 2017-11-23 MED ORDER — INSULIN PEN NEEDLE 32G X 6 MM MISC: 1.0000 | Freq: Every day | 0 refills | Status: DC | PRN

## 2017-11-23 MED FILL — NOVOFINE 32G NEEDLES: 32G X 6 MM | 30 days supply | Qty: 100 | Fill #0

## 2017-11-29 MED FILL — traZODone HCL 150 MG TABS: 150 | 90 days supply | Qty: 90 | Fill #1

## 2017-12-06 IMAGING — MG 2D DIGITAL SCREENING BILATERAL MAMMOGRAM WITH CAD AND ADJUNCT TO
6 of 9 series · 6 of 25 positions shown · non-contrast
Comparison: Previous exam(s).

CLINICAL DATA: Screening.

EXAM:
2D DIGITAL SCREENING BILATERAL MAMMOGRAM WITH CAD AND ADJUNCT TOMO

[R CC (1 of 2)]
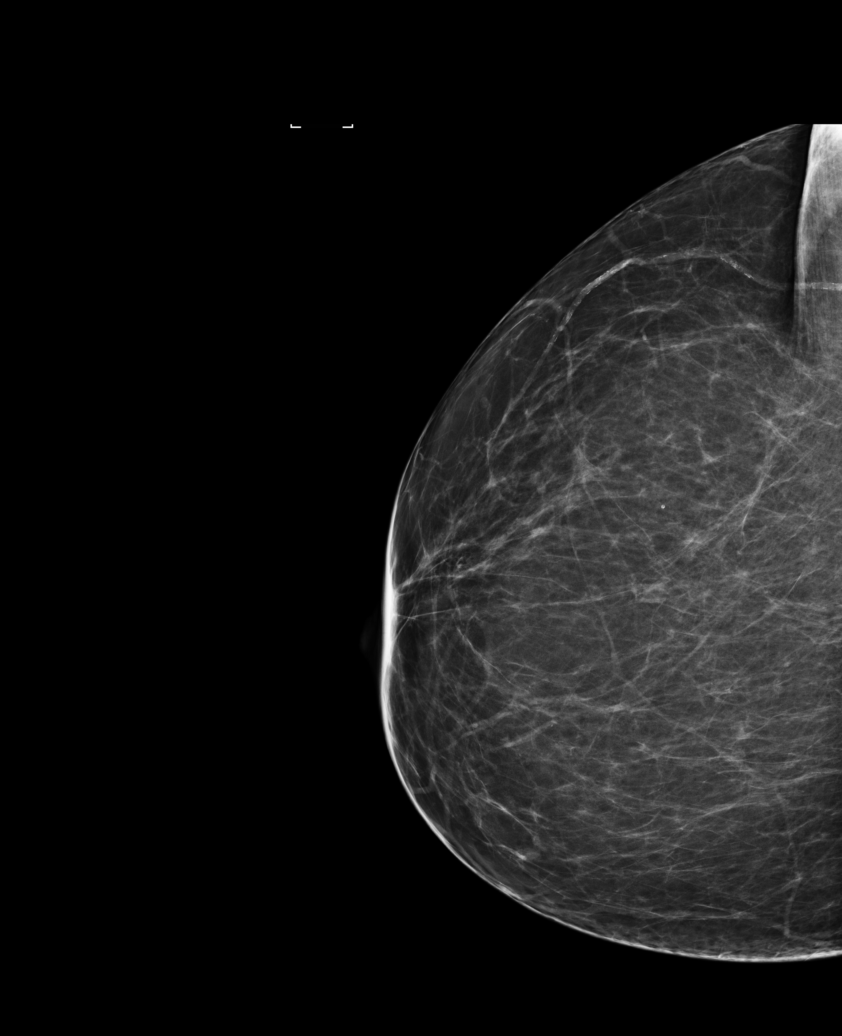

[R CC (2 of 2)]
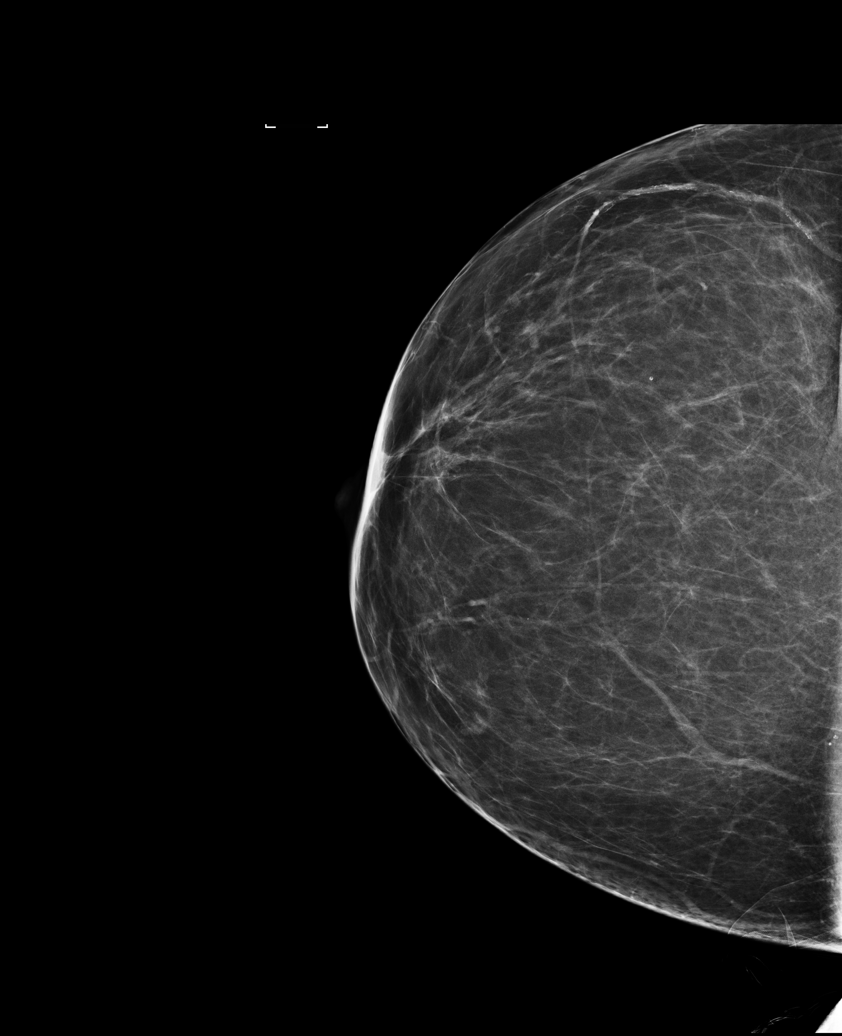

[R MLO]
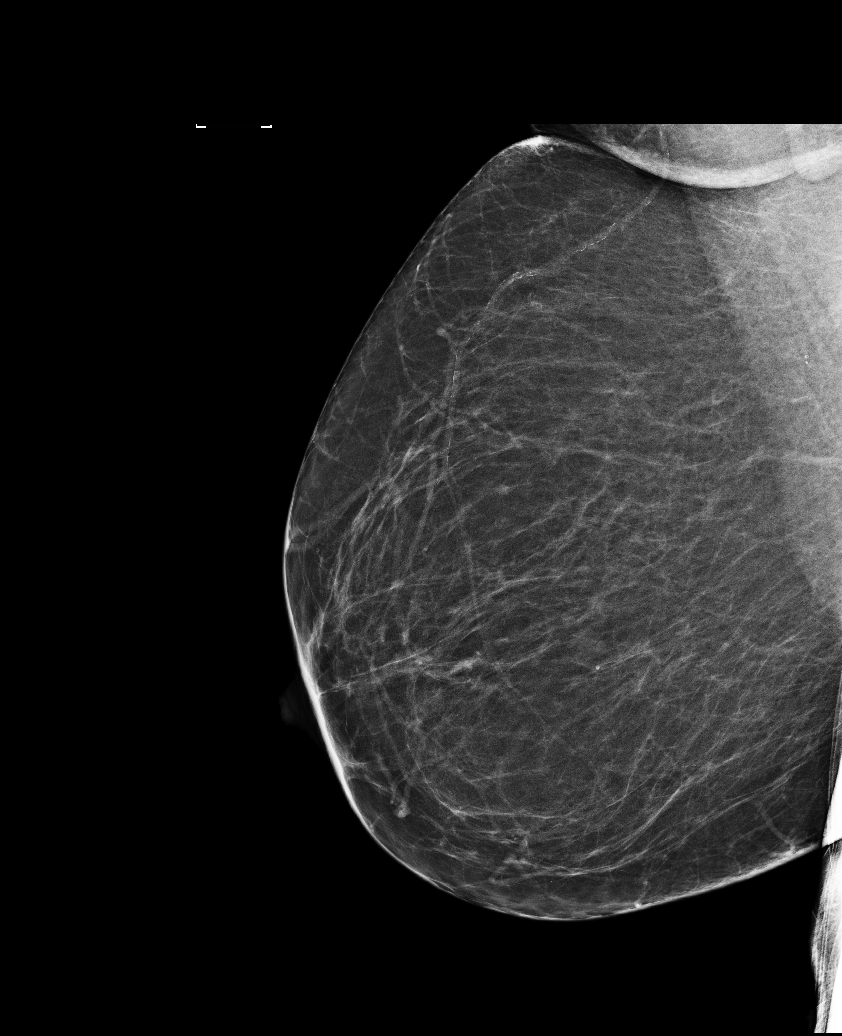

[L MLO]
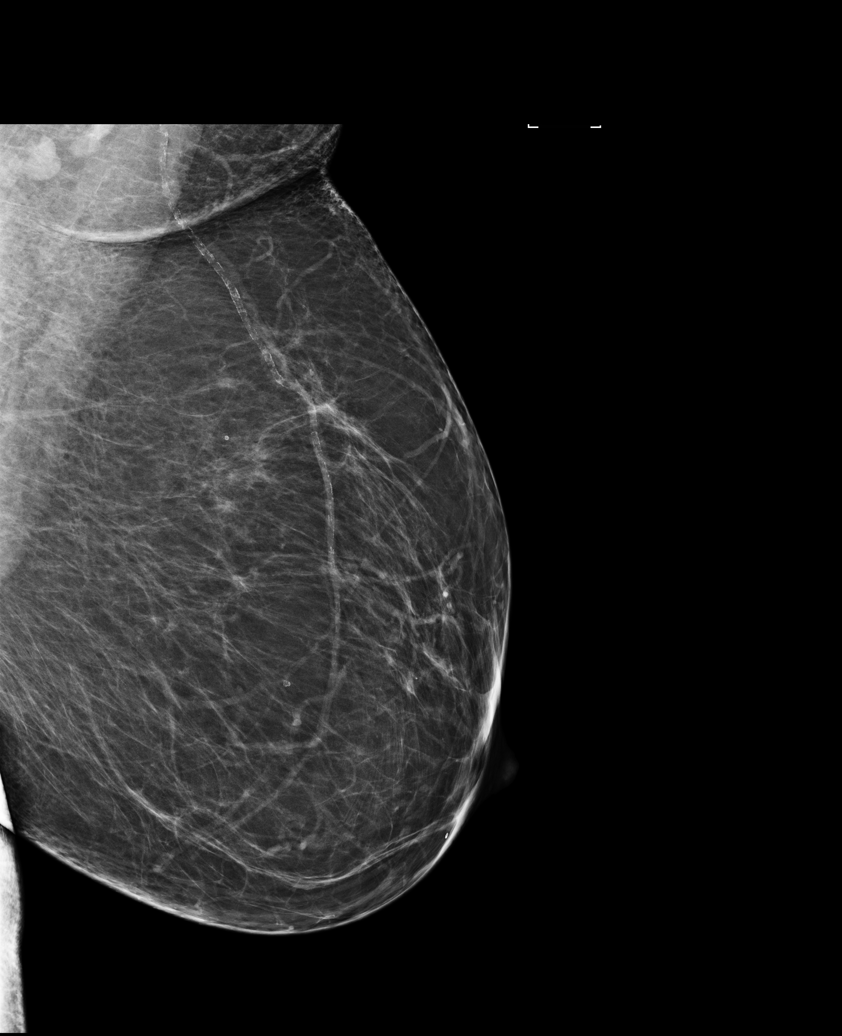

[L CC]
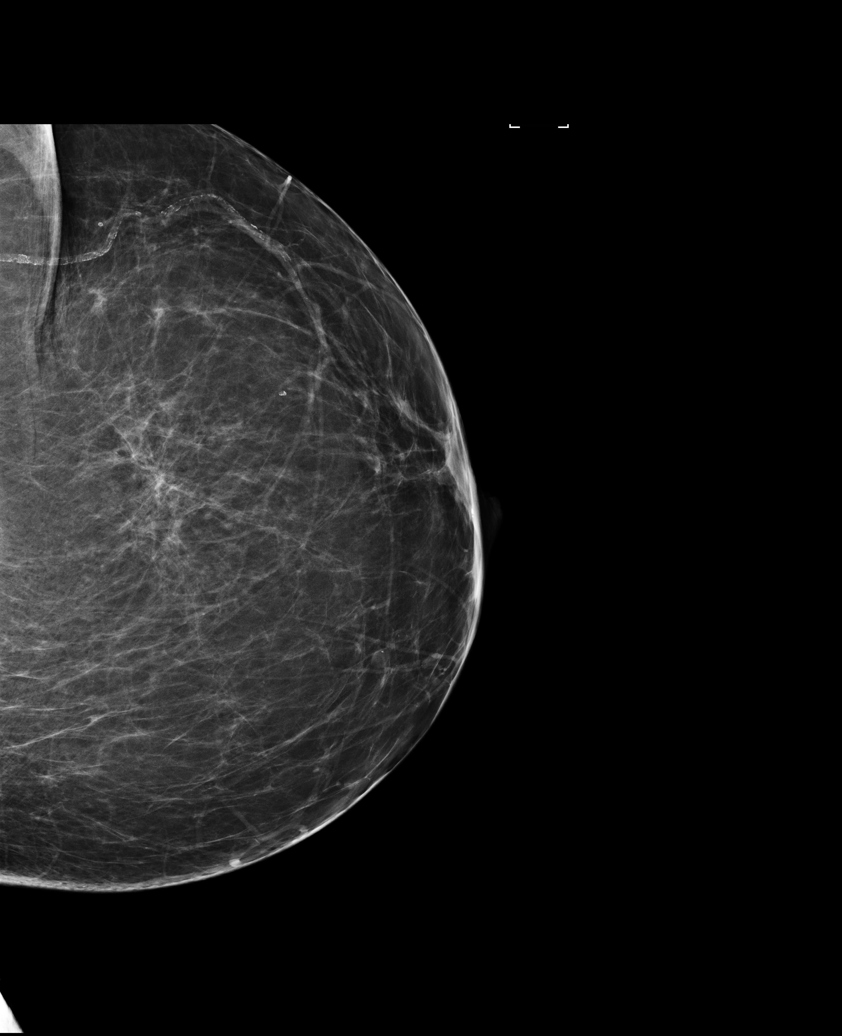

[R MLO tomo · tomo slice 51/101.0]
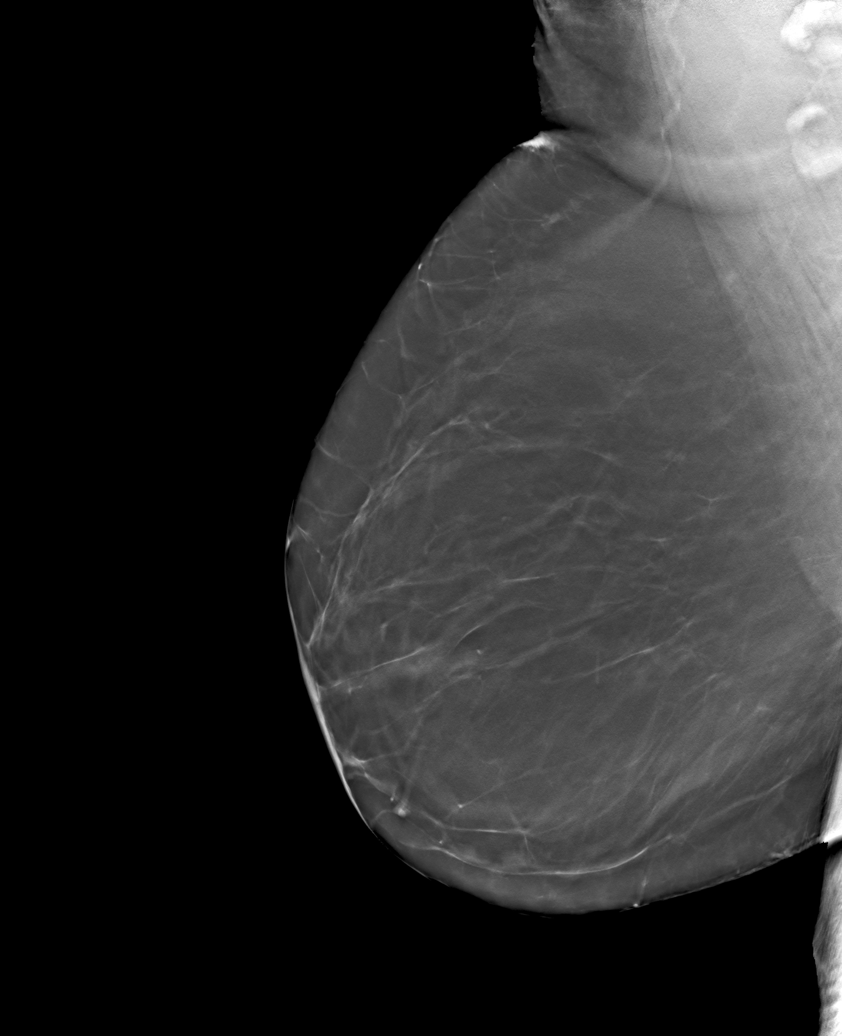

[6 of 25 positions shown; findings below may reference images not displayed]

ACR Breast Density Category b: There are scattered areas of
fibroglandular density.
FINDINGS: There are no findings suspicious for malignancy. Images were
processed with CAD.
IMPRESSION: No mammographic evidence of malignancy. A result letter of this
screening mammogram will be mailed directly to the patient.

RECOMMENDATION:
Screening mammogram in one year. (Code:97-6-RS4)

BI-RADS CATEGORY  1: Negative.

## 2017-12-17 ENCOUNTER — Other Ambulatory Visit: Payer: Self-pay | Admitting: Family Medicine

## 2017-12-17 MED FILL — TEMAZEPAM 30 MG CAPSULE: 30 | 30 days supply | Qty: 30 | Fill #2

## 2017-12-18 ENCOUNTER — Ambulatory Visit (INDEPENDENT_AMBULATORY_CARE_PROVIDER_SITE_OTHER): Payer: 59 | Admitting: Family Medicine

## 2017-12-18 DIAGNOSIS — F418 Other specified anxiety disorders: Secondary | ICD-10-CM

## 2017-12-18 DIAGNOSIS — E559 Vitamin D deficiency, unspecified: Secondary | ICD-10-CM

## 2017-12-18 DIAGNOSIS — E782 Mixed hyperlipidemia: Secondary | ICD-10-CM

## 2017-12-18 DIAGNOSIS — R739 Hyperglycemia, unspecified: Secondary | ICD-10-CM

## 2017-12-18 DIAGNOSIS — R000 Tachycardia, unspecified: Secondary | ICD-10-CM

## 2017-12-18 MED ORDER — LORAZEPAM 1 MG PO TABS: ORAL_TABLET | ORAL | 2 refills | Status: DC

## 2017-12-18 MED ORDER — NYSTATIN 100000 UNIT/GM EX CREA: 1.0000 "application " | TOPICAL_CREAM | Freq: Two times a day (BID) | CUTANEOUS | 2 refills | Status: DC

## 2017-12-18 MED ORDER — TEMAZEPAM 30 MG PO CAPS
30.0000 mg | ORAL_CAPSULE | Freq: Every day | ORAL | 3 refills | Status: DC
Start: 2017-12-18 — End: 2018-01-17

## 2017-12-18 MED ORDER — PANTOPRAZOLE SODIUM 40 MG PO TBEC: 40.0000 mg | DELAYED_RELEASE_TABLET | Freq: Every day | ORAL | 1 refills | Status: DC

## 2017-12-18 MED ORDER — NEBIVOLOL HCL 20 MG PO TABS: 40.0000 mg | ORAL_TABLET | Freq: Every day | ORAL | 1 refills | Status: DC

## 2017-12-18 MED ORDER — NEBIVOLOL HCL 20 MG PO TABS: 20.0000 mg | ORAL_TABLET | Freq: Every day | ORAL | 1 refills | Status: DC

## 2017-12-18 MED FILL — LORazepam 1 MG TABS: 1 | 23 days supply | Qty: 70 | Fill #0

## 2017-12-18 NOTE — Progress Notes (Signed)
Subjective:  I acted as a Education administrator for Dr. Charlett Blake. [name], [place]  Patient ID: [name] Jensen, female    DOB: 19-Oct-1956, 62 y.o.   MRN: [id number]  No chief complaint on file.   HPI  Patient is in today for a follow up and overall she is doing well.  Trazodone has helped her sleep some but only for 4-6 hours and then she awakens.  She does not wake up with any untoward side effects such as excessive somnolence so this pleases her.  Overall her mood is slightly improved and she is doing well at work.  No recent febrile illness or hospitalizations. Denies CP/palp/SOB/HA/congestion/fevers/GI or GU c/o. Taking meds as prescribed  Patient Care Team: [name], MD as PCP - General (Family Medicine)   Past Medical History:  Diagnosis Date  . Adjustment reaction with anxiety and depression 06/12/2014  . Anemia    h/o low ferritin  . Anxiety   . Asthma    environmental triggers  . Chicken pox as a child  . Cough 08/22/2015  . Depression   . Depression with anxiety 06/12/2014  . Diverticulosis   . Ehrlichiosis 8563  . Gallstones   . GERD (gastroesophageal reflux disease)   . Hair loss 03/28/2017  . Hiatal hernia 2007  . History of viral illness   . Hyperlipidemia, mixed 04/11/2015  . Insomnia 08/21/2017  . Lactose intolerance 06/12/2014  . Mumps as a child  . Overweight 08/19/2014  . Rectocele 06/12/2014  . RLS (restless legs syndrome)   . 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 Sessile colonic polyp   . SVT (supraventricular tachycardia) ([place])   . Tachycardia 06/12/2014  . Thyroid disease   . Vitamin D deficiency 08/22/2015    Past Surgical History:  Procedure Laterality Date  . ABDOMINAL HYSTERECTOMY  11-2008   total with cystocele, rectocele repair  . APPENDECTOMY    . CHOLECYSTECTOMY    . COLONOSCOPY  2006   diverticulosis, hemorrhoids ([place])  . ESOPHAGOGASTRODUODENOSCOPY  2006   small hiatus hernia ([place])  . INCONTINENCE SURGERY    .  SKIN SURGERY     right arm scc  . svt ablation    . TONSILLECTOMY  1972   2ith adenoid  . WISDOM TOOTH EXTRACTION  62 yrs old    Family History  Problem Relation Age of Onset  . Hypertension Mother   . Hypertension Father   . Hypertension Sister   . Diabetes Sister        type 2  . Fibromyalgia Sister   . GI Bleed Maternal Grandmother   . Atrial fibrillation Maternal Grandmother   . Heart disease Maternal Grandfather   . Diabetes Paternal Grandmother   . Stroke Paternal Grandfather   . Proteinuria Sister     Social History   Socioeconomic History  . Marital status: Divorced    Spouse name: Not on file  . Number of children: 2  . Years of education: Not on file  . Highest education level: Not on file  Social Needs  . Financial resource strain: Not on file  . Food insecurity - worry: Not on file  . Food insecurity - inability: Not on file  . Transportation needs - medical: Not on file  . Transportation needs - non-medical: Not on file  Occupational History  . Occupation: Programmer, multimedia: [employer]  Tobacco Use  . Smoking status: Never Smoker  . Smokeless tobacco:  Never Used  Substance and Sexual Activity  . Alcohol use: No    Alcohol/week: 0.0 oz  . Drug use: No  . Sexual activity: Not on file    Comment: lives by self with 4 dogs works at [employer], avoids dairy  Other Topics Concern  . Not on file  Social History Narrative  . Not on file    Outpatient Medications Prior to Visit  Medication Sig Dispense Refill  . albuterol (PROVENTIL HFA;VENTOLIN HFA) 108 (90 Base) MCG/ACT inhaler Inhale 2 puffs into the lungs every 6 (six) hours as needed for wheezing. 1 Inhaler 6  . aspirin (ASPIRIN EC) 81 MG EC tablet Take 81 mg by mouth at bedtime.     . beclomethasone (QVAR) 80 MCG/ACT inhaler Inhale 1 puff into the lungs daily. 1 Inhaler 6  . Cholecalciferol (VITAMIN D3) 2000 units TABS Take 1 tablet by mouth daily.    . metoprolol succinate (TOPROL-XL) 100 MG 24  hr tablet Take 2 tablets BID 120 tablet 0  . Omega-3 Fatty Acids (FISH OIL) 1000 MG CAPS Take 2 capsules by mouth 2 (two) times daily.    . ranitidine (ZANTAC) 300 MG tablet TAKE 1 TABLET BY MOUTH ONCE DAILY AT BEDTIME AS NEEDED FOR HEARTBURN 30 tablet 1  . Spacer/Aero Chamber Mouthpiece MISC Use with Albuterol HFA as directed 1 each 1  . traZODone (DESYREL) 150 MG tablet Take 0.5 tablets (75 mg total) by mouth at bedtime. 90 tablet 1  . Insulin Pen Needle (NOVOFINE) 32G X 6 MM MISC 1 each by Does not apply route daily as needed. 100 each 0  . LORazepam (ATIVAN) 1 MG tablet TAKE 1/2 TO 1 TABLET BY MOUTH EVERY 8 HOURS AS NEEDED FOR ANXIETY 70 tablet 2  . omeprazole (PRILOSEC) 40 MG capsule Take 1 capsule (40 mg total) by mouth daily. 30 capsule 3  . temazepam (RESTORIL) 30 MG capsule Take 1 capsule (30 mg total) by mouth at bedtime. 30 capsule 2   No facility-administered medications prior to visit.     Allergies  Allergen Reactions  . Amitriptyline     hallucinations  . Dilaudid [Hydromorphone Hcl]     Respiratory , cardiac   . Effexor [Venlafaxine] Other (See Comments)    myalgia  . Abilify [Aripiprazole] Palpitations    Review of Systems  Constitutional: Positive for malaise/fatigue. Negative for fever.  HENT: Negative for congestion.   Eyes: Negative for blurred vision.  Respiratory: Negative for shortness of breath.   Cardiovascular: Negative for chest pain, palpitations and leg swelling.  Gastrointestinal: Negative for abdominal pain, blood in stool and nausea.  Genitourinary: Negative for dysuria and frequency.  Musculoskeletal: Negative for falls.  Skin: Negative for rash.  Neurological: Negative for dizziness, loss of consciousness and headaches.  Endo/Heme/Allergies: Negative for environmental allergies.  Psychiatric/Behavioral: Positive for depression. The patient has insomnia. The patient is not nervous/anxious.        Objective:    Physical Exam  Constitutional:  She is oriented to person, place, and time. She appears well-developed and well-nourished. No distress.  HENT:  Head: Normocephalic and atraumatic.  Nose: Nose normal.  Eyes: Right eye exhibits no discharge. Left eye exhibits no discharge.  Neck: Normal range of motion. Neck supple.  Cardiovascular: Normal rate and regular rhythm.  No murmur heard. Pulmonary/Chest: Effort normal and breath sounds normal.  Abdominal: Soft. Bowel sounds are normal. There is no tenderness.  Musculoskeletal: She exhibits no edema.  Neurological: She is alert and oriented  to person, place, and time.  Skin: Skin is warm and dry.  Psychiatric: She has a normal mood and affect.  Nursing note and vitals reviewed.   BP 128/76 (BP Location: Left Arm, Patient Position: Sitting, Cuff Size: Normal)   Pulse 74   Temp 98.2 F (36.8 C) (Oral)   Resp 18   Wt 197 lb 12.8 oz (89.7 kg)   SpO2 97%   BMI 32.92 kg/m  Wt Readings from Last 3 Encounters:  12/18/17 197 lb 12.8 oz (89.7 kg)  10/02/17 197 lb 9.6 oz (89.6 kg)  08/21/17 199 lb 6.4 oz (90.4 kg)   BP Readings from Last 3 Encounters:  12/18/17 128/76  10/02/17 126/76  08/21/17 120/72     Immunization History  Administered Date(s) Administered  . Influenza Split 10/04/2013  . Influenza,inj,Quad PF,6+ Mos 08/21/2017  . Influenza-Unspecified 09/14/2014, 09/08/2015  . Tdap 04/03/2009    Health Maintenance  Topic Date Due  . PAP SMEAR  07/28/1979  . TETANUS/TDAP  04/04/2019  . MAMMOGRAM  09/28/2019  . COLONOSCOPY  11/13/2019  . INFLUENZA VACCINE  Completed  . Hepatitis C Screening  Completed  . HIV Screening  Completed    Lab Results  Component Value Date   WBC 7.9 08/22/2017   HGB 13.0 08/22/2017   HCT 39.1 08/22/2017   PLT 298.0 08/22/2017   GLUCOSE 118 (H) 08/22/2017   CHOL 225 (H) 08/22/2017   TRIG 215.0 (H) 08/22/2017   HDL 69.80 08/22/2017   LDLDIRECT 110.0 08/22/2017   LDLCALC 119 (H) 01/03/2017   ALT 24 08/22/2017   AST 20  08/22/2017   NA 141 08/22/2017   K 3.8 08/22/2017   CL 105 08/22/2017   CREATININE 0.77 08/22/2017   BUN 14 08/22/2017   CO2 28 08/22/2017   TSH 1.67 08/22/2017   INR 1.0 11/09/2008   HGBA1C 6.1 08/22/2017    Lab Results  Component Value Date   TSH 1.67 08/22/2017   Lab Results  Component Value Date   WBC 7.9 08/22/2017   HGB 13.0 08/22/2017   HCT 39.1 08/22/2017   MCV 85.0 08/22/2017   PLT 298.0 08/22/2017   Lab Results  Component Value Date   NA 141 08/22/2017   K 3.8 08/22/2017   CO2 28 08/22/2017   GLUCOSE 118 (H) 08/22/2017   BUN 14 08/22/2017   CREATININE 0.77 08/22/2017   BILITOT 0.4 08/22/2017   ALKPHOS 70 08/22/2017   AST 20 08/22/2017   ALT 24 08/22/2017   PROT 7.2 08/22/2017   ALBUMIN 4.2 08/22/2017   CALCIUM 9.5 08/22/2017   GFR 81.53 08/22/2017   Lab Results  Component Value Date   CHOL 225 (H) 08/22/2017   Lab Results  Component Value Date   HDL 69.80 08/22/2017   Lab Results  Component Value Date   LDLCALC 119 (H) 01/03/2017   Lab Results  Component Value Date   TRIG 215.0 (H) 08/22/2017   Lab Results  Component Value Date   CHOLHDL 3 08/22/2017   Lab Results  Component Value Date   HGBA1C 6.1 08/22/2017         Assessment & Plan:   Problem List Items Addressed This Visit    Tachycardia    Check labs change to Bystolic 16-10 mg and stop Metoprolol      Relevant Orders   CBC   Comprehensive metabolic panel   TSH   Depression with anxiety    Is stable and managing most daily stress but still struggling  with sleep some the Trazadone helps her sleep some but she awakes in 4-6 hours. Will try increasing to 150 mg qhs and reassess.      Relevant Medications   LORazepam (ATIVAN) 1 MG tablet   Hyperlipidemia, mixed    Encouraged heart healthy diet, increase exercise, avoid trans fats, consider a krill oil cap daily      Relevant Medications   Nebivolol HCl (BYSTOLIC) 20 MG TABS   Other Relevant Orders   Lipid panel    Hyperglycemia    hgba1c acceptable, minimize simple carbs. Increase exercise as tolerated.      Relevant Orders   Hemoglobin A1c   Vitamin D deficiency    Check level with lab work      Relevant Orders   VITAMIN D 25 Hydroxy (Vit-D Deficiency, Fractures)      I have discontinued Barnetta Chapel A. [place]'s omeprazole, Insulin Pen Needle, and Nebivolol HCl. I am also having her start on pantoprazole, nystatin cream, and Nebivolol HCl. Additionally, I am having her maintain her aspirin, Vitamin D3, Fish Oil, albuterol, beclomethasone, metoprolol succinate, Spacer/Aero Chamber Mouthpiece, traZODone, ranitidine, LORazepam, and temazepam.  Meds ordered this encounter  Medications  . LORazepam (ATIVAN) 1 MG tablet    Sig: TAKE 1/2 TO 1 TABLET BY MOUTH EVERY 8 HOURS AS NEEDED FOR ANXIETY    Dispense:  70 tablet    Refill:  2  . temazepam (RESTORIL) 30 MG capsule    Sig: Take 1 capsule (30 mg total) by mouth at bedtime.    Dispense:  30 capsule    Refill:  3  . pantoprazole (PROTONIX) 40 MG tablet    Sig: Take 1 tablet (40 mg total) by mouth daily.    Dispense:  90 tablet    Refill:  1  . nystatin cream (MYCOSTATIN)    Sig: Apply 1 application topically 2 (two) times daily.    Dispense:  30 g    Refill:  2  . DISCONTD: Nebivolol HCl (BYSTOLIC) 20 MG TABS    Sig: Take 2 tablets (40 mg total) by mouth daily.    Dispense:  180 tablet    Refill:  1  . Nebivolol HCl (BYSTOLIC) 20 MG TABS    Sig: Take 1-2 tablets (20-40 mg total) by mouth daily. Failed Metoprolol and CCB    Dispense:  180 tablet    Refill:  1    CMA served as scribe during this visit. History, Physical and Plan performed by medical provider. Documentation and orders reviewed and attested to.  Penni Homans, MD

## 2017-12-18 NOTE — Telephone Encounter (Signed)
rx faxed

## 2017-12-18 NOTE — Assessment & Plan Note (Signed)
Encouraged heart healthy diet, increase exercise, avoid trans fats, consider a krill oil cap daily 

## 2017-12-18 NOTE — Patient Instructions (Signed)
Sinus Tachycardia °Sinus tachycardia is a kind of fast heartbeat. In sinus tachycardia, the heart beats more than 100 times a minute. Sinus tachycardia starts in a part of the heart called the sinus node. Sinus tachycardia may be harmless, or it may be a sign of a serious condition. °What are the causes? °This condition may be caused by: °· Exercise or exertion. °· A fever. °· Pain. °· Loss of body fluids (dehydration). °· Severe bleeding (hemorrhage). °· Anxiety and stress. °· Certain substances, including: °? Alcohol. °? Caffeine. °? Tobacco and nicotine products. °? Diet pills. °? Illegal drugs. °· Medical conditions including: °? Heart disease. °? An infection. °? An overactive thyroid (hyperthyroidism). °? A lack of red blood cells (anemia). ° °What are the signs or symptoms? °Symptoms of this condition include: °· A feeling that the heart is beating quickly (palpitations). °· Suddenly noticing your heartbeat (cardiac awareness). °· Dizziness. °· Tiredness (fatigue). °· Shortness of breath. °· Chest pain. °· Nausea. °· Fainting. ° °How is this diagnosed? °This condition is diagnosed with: °· A physical exam. °· Other tests, such as: °? Blood tests. °? An electrocardiogram (ECG). This test measures the electrical activity of the heart. °? Holter monitoring. For this test, you wear a device that records your heartbeat for one or more days. ° °You may be referred to a heart specialist (cardiologist). °How is this treated? °Treatment for this condition depends on the cause or underlying condition. Treatment may involve: °· Treating the underlying condition. °· Taking new medicines or changing your current medicines as told by your health care provider. °· Making changes to your diet or lifestyle. °· Practicing relaxation methods. ° °Follow these instructions at home: °Lifestyle °· Do not use any products that contain nicotine or tobacco, such as cigarettes and e-cigarettes. If you need help quitting, ask your  health care provider. °· Learn relaxation methods, like deep breathing, to help you when you get stressed or anxious. °· Do not use illegal drugs, such as cocaine. °· Do not abuse alcohol. Limit alcohol intake to no more than 1 drink a day for non-pregnant women and 2 drinks a day for men. One drink equals 12 oz of beer, 5 oz of wine, or 1½ oz of hard liquor. °· Find time to rest and relax often. This reduces stress. °· Avoid: °? Caffeine. °? Stimulants such as over-the-counter diet pills or pills that help you to stay awake. °? Situations that cause anxiety or stress. °General instructions °· Drink enough fluids to keep your urine clear or pale yellow. °· Take over-the-counter and prescription medicines only as told by your health care provider. °· Keep all follow-up visits as told by your health care provider. This is important. °Contact a health care provider if: °· You have a fever. °· You have vomiting or diarrhea that keeps happening (is persistent). °Get help right away if: °· You have pain in your chest, upper arms, jaw, or neck. °· You become weak or dizzy. °· You feel faint. °· You have palpitations that do not go away. °This information is not intended to replace advice given to you by your health care provider. Make sure you discuss any questions you have with your health care provider. °Document Released: 12/28/2004 Document Revised: 06/17/2016 Document Reviewed: 06/04/2015 °Elsevier Interactive Patient Education © 2018 [employer] ° °

## 2017-12-18 NOTE — Telephone Encounter (Signed)
Requesting:ativan  Contract:yes UDS:low risk next screen 01/19/18 Last OV:10/02/17 Next OV:12/18/17 Last Refill:08/14/17 [email or web address]   Please advise

## 2017-12-18 NOTE — Assessment & Plan Note (Signed)
hgba1c acceptable, minimize simple carbs. Increase exercise as tolerated.  

## 2017-12-18 NOTE — Assessment & Plan Note (Signed)
Check level with lab work

## 2017-12-18 NOTE — Assessment & Plan Note (Signed)
Check labs change to Bystolic 80-22 mg and stop Metoprolol

## 2017-12-19 MED FILL — NYSTATIN 100,000 UNIT/GM CR: 100000 | 10 days supply | Qty: 30 | Fill #0

## 2017-12-19 MED FILL — PANTOPRAZOLE SOD DR 40 MG T: 40 | 90 days supply | Qty: 90 | Fill #0

## 2017-12-19 MED FILL — BYSTOLIC 20 MG TABLET: 20 | 90 days supply | Qty: 180 | Fill #0

## 2017-12-24 NOTE — Assessment & Plan Note (Signed)
Is stable and managing most daily stress but still struggling with sleep some the Trazadone helps her sleep some but she awakes in 4-6 hours. Will try increasing to 150 mg qhs and reassess.

## 2017-12-26 ENCOUNTER — Ambulatory Visit: Payer: 59 | Admitting: Cardiovascular Disease

## 2017-12-26 ENCOUNTER — Encounter: Payer: Self-pay | Admitting: Cardiovascular Disease

## 2017-12-26 VITALS — BP 132/84 | HR 83 | Ht 65.0 in | Wt 190.0 lb

## 2017-12-26 DIAGNOSIS — R079 Chest pain, unspecified: Secondary | ICD-10-CM | POA: Diagnosis not present

## 2017-12-26 DIAGNOSIS — R000 Tachycardia, unspecified: Secondary | ICD-10-CM

## 2017-12-26 MED ORDER — METOPROLOL SUCCINATE ER 100 MG PO TB24: 100.0000 mg | ORAL_TABLET | Freq: Two times a day (BID) | ORAL | 3 refills | Status: DC

## 2017-12-26 MED FILL — METOPROLOL SUCC ER 100 MG T: 100 | 90 days supply | Qty: 180 | Fill #0

## 2017-12-26 NOTE — Addendum Note (Signed)
Addended by: Debbora Lacrosse R on: 12/26/2017 03:14 PM   Modules accepted: Orders

## 2017-12-26 NOTE — Patient Instructions (Addendum)
Medication Instructions:  STOP BYSTOLIC  START METOPROLOL XL 100 MG BID  Labwork: NONE  Testing/Procedures: Your physician has requested that you have cardiac CT. Cardiac computed tomography (CT) is a painless test that uses an x-ray machine to take clear, detailed pictures of your heart. For further information please visit [email or web address]. Please follow instruction sheet as given.     Follow-Up: Your physician recommends that you schedule a follow-up appointment in: TO BE DETERMINED BASED ON TEST    Any Other Special Instructions Will Be Listed Below (If Applicable).     If you need a refill on your cardiac medications before your next appointment, please call your pharmacy.    Please arrive at the [facility] main entrance of [facility] at xx:xx AM (30-45 minutes prior to test start time)  [facility] [address] [place], [place] [address] [phone]  Proceed to the [facility] Radiology Department (First Floor).  On the Night Before the Test: . Drink plenty of water. . Do not consume any caffeinated/decaffeinated beverages or chocolate 12 hours prior to your test. . Do not take any antihistamines 12 hours prior to your test. . If you take Metformin do not take 24 hours prior to test. . If the patient has contrast allergy: ? Patient will need a prescription for Prednisone and very clear instructions (as follows): 1. Prednisone 50 mg - take 13 hours prior to test 2. Take another Prednisone 50 mg 7 hours prior to test 3. Take another Prednisone 50 mg 1 hour prior to test 4. Take Benadryl 50 mg 1 hour prior to test . Patient must complete all four doses of above prophylactic medications. . Patient will need a ride after test due to Benadryl.  On the Day of the Test: . Drink plenty of water. Do not drink any water within one hour of the test. . Do not eat any food 4 hours prior to the test. . You may take your regular medications prior  to the test. . IF NOT ON A BETA BLOCKER - Take 50 mg of lopressor (metoprolol) one hour before the test. . HOLD Furosemide morning of the test.  After the Test: . Drink plenty of water. . After receiving IV contrast, you may experience a mild flushed feeling. This is normal. . On occasion, you may experience a mild rash up to 24 hours after the test. This is not dangerous. If this occurs, you can take Benadryl 25 mg and increase your fluid intake. . If you experience trouble breathing, this can be serious. If it is severe call 911 IMMEDIATELY. If it is mild, please call our office. . If you take any of these medications: Glipizide/Metformin, Avandament, Glucavance, please do not take 48 hours after completing test.

## 2017-12-26 NOTE — Addendum Note (Signed)
Addended by: Debbora Lacrosse R on: 12/26/2017 03:12 PM   Modules accepted: Orders

## 2017-12-26 NOTE — Progress Notes (Signed)
CARDIOLOGY CONSULT NOTE  Patient ID: [name] Jensen MRN: [id number] DOB/AGE: 1956-10-06 62 y.o.  Admit date: (Not on file) Primary Physician: [name], MD Referring Physician: Penni Homans  Reason for Consultation: Chest pain, abnormal ECG  HPI: [name] Jensen is a 62 y.o. female who is being seen today for the evaluation of chest pain and abnormal ECG at the request of [name], MD.   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  This is my first clinical evaluation of her.  She has a history of SVT status post ablation several years ago.  She remained tachycardic and had what appeared to be an inappropriate sinus tachycardia.  She has taken nitroglycerin for chest pain before but felt it was secondary to GERD and esophageal pathology.  She has struggled with depression and it was felt may be this was due to high doses of metoprolol succinate (200 mg bid).  This was recently switched to Bystolic.  Since that time, she has felt uncomfortable sensations in her chest.  Today she developed chest pressure and had a headache.  She checked her blood pressure and it was normal.  She had an ECG today which I personally reviewed which demonstrated sinus rhythm with nonspecific ST segment abnormalities, with T wave inversions in lead III, aVF, and V6 with nonspecific T wave inversions in V4 and V5..  I compared this with an ECG performed in August 2014 which demonstrated a mild sinus tachycardia with nonspecific ST segment abnormalities.  Today's ECG is markedly different.  She underwent a normal stress echocardiogram on 06/24/14.  She describes the chest discomfort as a squeezing sensation above the left breast.  There is no radiation to the neck, jaw, back, or left arm.  Symptoms are markedly different from her SVT and different than her typical GERD symptoms.  She had been taking 20 mg of nebivolol daily but due  to tachycardia and palpitations, she increased it to 40 mg daily about 8 days ago.  This is when her symptoms began.  She had blood tests ordered by her PCP but she has yet to have them drawn.  Direct LDL was 110 on 08/22/17.  Lipid panel showed total cholesterol 225, triglycerides 215, HDL 69.8.  Additional labs include BUN 14, creatinine 0.77, normal liver transaminases, sodium 141, potassium 3.8, TSH 1.67, normal CBC, and HbA1c 6.1%.   Allergies  Allergen Reactions  . Dilaudid [Hydromorphone Hcl]     Respiratory , cardiac     Current Outpatient Medications  Medication Sig Dispense Refill  . albuterol (PROVENTIL HFA;VENTOLIN HFA) 108 (90 Base) MCG/ACT inhaler Inhale 2 puffs into the lungs every 6 (six) hours as needed for wheezing. 1 Inhaler 6  . aspirin (ASPIRIN EC) 81 MG EC tablet Take 81 mg by mouth at bedtime.     . beclomethasone (QVAR) 80 MCG/ACT inhaler Inhale 1 puff into the lungs daily. 1 Inhaler 6  . Cholecalciferol (VITAMIN D3) 2000 units TABS Take 1 tablet by mouth daily.    Marland Kitchen LORazepam (ATIVAN) 1 MG tablet TAKE 1/2 TO 1 TABLET BY MOUTH EVERY 8 HOURS AS NEEDED FOR ANXIETY 70 tablet 2  . Nebivolol HCl (BYSTOLIC) 20 MG TABS Take 1-2 tablets (20-40 mg total) by mouth daily. Failed Metoprolol and CCB 180 tablet 1  . nystatin cream (MYCOSTATIN) Apply 1 application topically 2 (two) times daily. 30 g 2  .  Omega-3 Fatty Acids (FISH OIL) 1000 MG CAPS Take 2 capsules by mouth 2 (two) times daily.    . pantoprazole (PROTONIX) 40 MG tablet Take 1 tablet (40 mg total) by mouth daily. 90 tablet 1  . ranitidine (ZANTAC) 300 MG tablet TAKE 1 TABLET BY MOUTH ONCE DAILY AT BEDTIME AS NEEDED FOR HEARTBURN 30 tablet 1  . Spacer/Aero Chamber Mouthpiece MISC Use with Albuterol HFA as directed 1 each 1  . temazepam (RESTORIL) 30 MG capsule Take 1 capsule (30 mg total) by mouth at bedtime. 30 capsule 3  . traZODone (DESYREL) 150 MG tablet Take 0.5 tablets (75 mg total) by mouth at bedtime. 90  tablet 1   No current facility-administered medications for this visit.     Past Medical History:  Diagnosis Date  . Adjustment reaction with anxiety and depression 06/12/2014  . Anemia    h/o low ferritin  . Anxiety   . Asthma    environmental triggers  . Chicken pox as a child  . Cough 08/22/2015  . Depression   . Depression with anxiety 06/12/2014  . Diverticulosis   . Ehrlichiosis 2952  . Gallstones   . GERD (gastroesophageal reflux disease)   . Hair loss 03/28/2017  . Hiatal hernia 2007  . History of viral illness   . Hyperlipidemia, mixed 04/11/2015  . Insomnia 08/21/2017  . Lactose intolerance 06/12/2014  . Mumps as a child  . Overweight 08/19/2014  . Rectocele 06/12/2014  . RLS (restless legs syndrome)   . 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 Sessile colonic polyp   . SVT (supraventricular tachycardia) ([place])   . Tachycardia 06/12/2014  . Thyroid disease   . Vitamin D deficiency 08/22/2015    Past Surgical History:  Procedure Laterality Date  . ABDOMINAL HYSTERECTOMY  11-2008   total with cystocele, rectocele repair  . APPENDECTOMY    . CHOLECYSTECTOMY    . COLONOSCOPY  2006   diverticulosis, hemorrhoids ([place])  . ESOPHAGOGASTRODUODENOSCOPY  2006   small hiatus hernia ([place])  . INCONTINENCE SURGERY    . SKIN SURGERY     right arm scc  . svt ablation    . TONSILLECTOMY  1972   2ith adenoid  . WISDOM TOOTH EXTRACTION  62 yrs old    Social History   Socioeconomic History  . Marital status: Divorced    Spouse name: Not on file  . Number of children: 2  . Years of education: Not on file  . Highest education level: Not on file  Social Needs  . Financial resource strain: Not on file  . Food insecurity - worry: Not on file  . Food insecurity - inability: Not on file  . Transportation needs - medical: Not on file  . Transportation needs - non-medical: Not on file  Occupational History  . Occupation: Programmer, multimedia: [employer]  Tobacco Use  . Smoking status: Never Smoker  . Smokeless tobacco: Never Used  Substance and Sexual Activity  . Alcohol use: No    Alcohol/week: 0.0 oz  . Drug use: No  . Sexual activity: Not on file    Comment: lives by self with 4 dogs works at [employer], avoids dairy  Other Topics Concern  . Not on file  Social History Narrative  . Not on file     No family history of premature CAD in 1st degree relatives.  Current Meds  Medication Sig  . albuterol (PROVENTIL  HFA;VENTOLIN HFA) 108 (90 Base) MCG/ACT inhaler Inhale 2 puffs into the lungs every 6 (six) hours as needed for wheezing.  Marland Kitchen aspirin (ASPIRIN EC) 81 MG EC tablet Take 81 mg by mouth at bedtime.   . beclomethasone (QVAR) 80 MCG/ACT inhaler Inhale 1 puff into the lungs daily.  . Cholecalciferol (VITAMIN D3) 2000 units TABS Take 1 tablet by mouth daily.  Marland Kitchen LORazepam (ATIVAN) 1 MG tablet TAKE 1/2 TO 1 TABLET BY MOUTH EVERY 8 HOURS AS NEEDED FOR ANXIETY  . Nebivolol HCl (BYSTOLIC) 20 MG TABS Take 1-2 tablets (20-40 mg total) by mouth daily. Failed Metoprolol and CCB  . nystatin cream (MYCOSTATIN) Apply 1 application topically 2 (two) times daily.  . Omega-3 Fatty Acids (FISH OIL) 1000 MG CAPS Take 2 capsules by mouth 2 (two) times daily.  . pantoprazole (PROTONIX) 40 MG tablet Take 1 tablet (40 mg total) by mouth daily.  . ranitidine (ZANTAC) 300 MG tablet TAKE 1 TABLET BY MOUTH ONCE DAILY AT BEDTIME AS NEEDED FOR HEARTBURN  . Spacer/Aero Chamber Mouthpiece MISC Use with Albuterol HFA as directed  . temazepam (RESTORIL) 30 MG capsule Take 1 capsule (30 mg total) by mouth at bedtime.  . traZODone (DESYREL) 150 MG tablet Take 0.5 tablets (75 mg total) by mouth at bedtime.      Review of systems complete and found to be negative unless listed above in HPI    Physical exam Blood pressure 132/84, pulse 83, height 5\' 5"  (1.651 m), weight 190 lb (86.2 kg), SpO2 97 %. General: NAD Neck: No JVD, no thyromegaly or thyroid  nodule.  Lungs: Clear to auscultation bilaterally with normal respiratory effort. CV: Nondisplaced PMI. Regular rate and rhythm, normal S1/S2, no S3/S4, no murmur.  No peripheral edema.  No carotid bruit.    Abdomen: Soft, nontender, no distention.  Skin: Intact without lesions or rashes.  Neurologic: Alert and oriented x 3.  Psych: Normal affect. Extremities: No clubbing or cyanosis.  HEENT: Normal.   ECG: Most recent ECG reviewed.   Labs: Lab Results  Component Value Date/Time   K 3.8 08/22/2017 10:30 AM   BUN 14 08/22/2017 10:30 AM   CREATININE 0.77 08/22/2017 10:30 AM   ALT 24 08/22/2017 10:30 AM   TSH 1.67 08/22/2017 10:30 AM   HGB 13.0 08/22/2017 10:30 AM     Lipids: Lab Results  Component Value Date/Time   LDLCALC 119 (H) 01/03/2017 09:21 AM   LDLDIRECT 110.0 08/22/2017 10:30 AM   CHOL 225 (H) 08/22/2017 10:30 AM   TRIG 215.0 (H) 08/22/2017 10:30 AM   HDL 69.80 08/22/2017 10:30 AM        ASSESSMENT AND PLAN:  1.  Chest pain: Symptoms appear to have a definitive temporal relationship with the cessation of metoprolol succinate and the initiation of nebivolol.  I will switch nebivolol to a lower dose of Toprol-XL at 100 mg twice daily.  I will also obtain a coronary artery CT to evaluate for any significant coronary disease.  She is currently on aspirin.  2.  Tachypalpitations with inappropriate sinus tachycardia: As stated above,  I will switch nebivolol to a lower dose of Toprol-XL at 100 mg twice daily.      Disposition: Follow up to be determined.   Signed: Kate Sable, M.D., F.A.C.C.  12/26/2017, 2:23 PM

## 2018-01-17 ENCOUNTER — Other Ambulatory Visit: Payer: Self-pay | Admitting: Family Medicine

## 2018-01-17 MED FILL — raNITIdine HCL 300 MG TABS: 300 | 30 days supply | Qty: 30 | Fill #0

## 2018-01-17 MED FILL — LORazepam 1 MG TABS: 1 | 23 days supply | Qty: 70 | Fill #1

## 2018-01-21 ENCOUNTER — Other Ambulatory Visit: Payer: Self-pay | Admitting: Family Medicine

## 2018-01-21 ENCOUNTER — Telehealth: Payer: Self-pay | Admitting: Family Medicine

## 2018-01-21 NOTE — Telephone Encounter (Signed)
Rx refill for provider review:  Restoril 30 mg   LOV: 12/18/17  Pharmacy: verified

## 2018-01-21 NOTE — Telephone Encounter (Signed)
Copied from [place]. Topic: Inquiry >> Jan 21, 2018 12:44 PM Pricilla Handler wrote: Reason for CRM: Patient called very upset over a prescription refill. Patient has been waiting for a week for a refill of Temazepam (RESTORIL) 30 MG capsule. Patient needs this medication for her restless leg syndrome. Patient has requested for someone from the office to call her today before 2pm. Someone from the office please call patient at [phone].

## 2018-01-21 NOTE — Telephone Encounter (Signed)
Medication sent over to PCP

## 2018-01-21 NOTE — Telephone Encounter (Signed)
Requesting:temazepam Contract:yes UDS:low risk  Last OV:12/18/17 Next OV:04/02/18 Last Refill:10/02/17  #[id number] Database:   Patient called [facility] last week for refill of this medication, there was no note in her chart or even a request  Please advise

## 2018-01-22 MED FILL — TEMAZEPAM 30 MG CAPSULE: 30 | 30 days supply | Qty: 30 | Fill #0

## 2018-01-22 NOTE — Telephone Encounter (Signed)
Spoke with patient she will have to wait due to her medication being shipped to her at [facility].   I spoke with the pharmacists they stated this is there policy.

## 2018-01-22 NOTE — Telephone Encounter (Signed)
Patient is very upset that her refill request for Tenazepam from 01/17/18 was sent in last night and the pharmacy is telling her since it was sent in lastnight, it can not be filled until tomorrow. Today is the 3rd day she has not taken the medication and is hoping the office can speed the process up for her to be able to get the medication today. Patient is requesting a call back from the office and can be reached at [phone] and can leave a VM.

## 2018-01-24 ENCOUNTER — Other Ambulatory Visit: Payer: Self-pay | Admitting: *Deleted

## 2018-01-24 ENCOUNTER — Other Ambulatory Visit (HOSPITAL_COMMUNITY)
Admission: RE | Admit: 2018-01-24 | Discharge: 2018-01-24 | Disposition: A | Discharge status: Home / Self Care | Payer: 59 | Source: Ambulatory Visit | Attending: Cardiovascular Disease | Admitting: Cardiovascular Disease

## 2018-01-24 DIAGNOSIS — Z79899 Other long term (current) drug therapy: Secondary | ICD-10-CM | POA: Insufficient documentation

## 2018-01-24 LAB — BASIC METABOLIC PANEL
ANION GAP: 12 (ref 5–15)
BUN: 11 mg/dL (ref 6–20)
CO2: 23 mmol/L (ref 22–32)
Calcium: 9.2 mg/dL (ref 8.9–10.3)
Chloride: 103 mmol/L (ref 101–111)
Creatinine, Ser: 0.8 mg/dL (ref 0.44–1.00)
Glucose, Bld: 134 mg/dL — ABNORMAL HIGH (ref 65–99)
POTASSIUM: 3.7 mmol/L (ref 3.5–5.1)
SODIUM: 138 mmol/L (ref 135–145)

## 2018-01-30 ENCOUNTER — Ambulatory Visit (HOSPITAL_COMMUNITY): Payer: 59

## 2018-01-30 ENCOUNTER — Ambulatory Visit (INDEPENDENT_AMBULATORY_CARE_PROVIDER_SITE_OTHER): Payer: Self-pay | Admitting: Emergency Medicine

## 2018-01-30 ENCOUNTER — Ambulatory Visit (HOSPITAL_COMMUNITY)
Admission: RE | Admit: 2018-01-30 | Discharge: 2018-01-30 | Disposition: A | Discharge status: Home / Self Care | Payer: 59 | Source: Ambulatory Visit | Attending: Cardiovascular Disease | Admitting: Cardiovascular Disease

## 2018-01-30 VITALS — BP 125/85 | HR 69 | Temp 98.3°F | Resp 16 | Wt 200.0 lb

## 2018-01-30 DIAGNOSIS — K449 Diaphragmatic hernia without obstruction or gangrene: Secondary | ICD-10-CM | POA: Diagnosis not present

## 2018-01-30 DIAGNOSIS — R079 Chest pain, unspecified: Secondary | ICD-10-CM | POA: Diagnosis not present

## 2018-01-30 DIAGNOSIS — R21 Rash and other nonspecific skin eruption: Secondary | ICD-10-CM

## 2018-01-30 MED ORDER — IOPAMIDOL (ISOVUE-370) INJECTION 76%
INTRAVENOUS | Status: AC
  Administered 2018-01-30: 80 mL
  Filled 2018-01-30: qty 100

## 2018-01-30 MED ORDER — TRIAMCINOLONE ACETONIDE 0.1 % EX CREA: 1.0000 "application " | TOPICAL_CREAM | Freq: Three times a day (TID) | CUTANEOUS | 1 refills | Status: DC

## 2018-01-30 MED ORDER — NITROGLYCERIN 0.4 MG SL SUBL
SUBLINGUAL_TABLET | SUBLINGUAL | Status: AC
  Filled 2018-01-30: qty 2

## 2018-01-30 MED ORDER — NITROGLYCERIN 0.4 MG SL SUBL
0.8000 mg | SUBLINGUAL_TABLET | Freq: Once | SUBLINGUAL | Status: AC
  Administered 2018-01-30: 0.8 mg via SUBLINGUAL

## 2018-01-30 NOTE — Progress Notes (Signed)
Subjective:     [name] Jensen is a 62 y.o. female who presents for evaluation of a rash involving the chest. Rash started 4 days ago. Lesions are red, and raised in texture. Rash has not changed over time. Rash is pruritic. Associated symptoms: none. Patient denies: abdominal pain, congestion, cough, decrease in appetite, decrease in energy level, fever, irritability, myalgia and nausea. Patient has not had contacts with similar rash. Patient has not had new exposures (soaps, lotions, laundry detergents, foods, medications, plants, insects or animals). Objective:   Vitals:   01/30/18 1040  BP: 125/85  Pulse: 69  Resp: 16  Temp: 98.3 F (36.8 C)  SpO2: 93%    Review of Systems  All other systems reviewed and are negative.   Physical Exam  Constitutional: She appears well-developed and well-nourished.  HENT:  Head: Normocephalic and atraumatic.  Neurological: She is alert.  Skin: Skin is warm and dry. Capillary refill takes less than 2 seconds. Rash noted. She is not diaphoretic.  Two raised, erythremic papules without scale anterior chest  Psychiatric: She has a normal mood and affect.  Nursing note and vitals reviewed.    Assessment:   rash    Plan:   Rash likely not herpetic, no vesicular appearance, will treat with topical steroids  triamcinolone  Retun if rash worsens, or if it fails to resolve.

## 2018-01-30 NOTE — Patient Instructions (Signed)

## 2018-01-31 ENCOUNTER — Telehealth: Payer: Self-pay

## 2018-01-31 DIAGNOSIS — I272 Pulmonary hypertension, unspecified: Secondary | ICD-10-CM

## 2018-01-31 NOTE — Telephone Encounter (Signed)
Pt notified. She voiced understanding. Order placed for echo.

## 2018-01-31 NOTE — Telephone Encounter (Signed)
-----   Message from Herminio Commons, MD sent at 01/31/2018 10:12 AM EST ----- Normal coronaries with coronary calcium score of 0. Mildly dilated pulmonary artery suggestive of pulmonary hypertension. Please obtain echocardiogram.

## 2018-02-01 ENCOUNTER — Ambulatory Visit (HOSPITAL_COMMUNITY)
Admission: RE | Admit: 2018-02-01 | Discharge: 2018-02-01 | Disposition: A | Discharge status: Home / Self Care | Payer: 59 | Source: Ambulatory Visit | Attending: Cardiovascular Disease | Admitting: Cardiovascular Disease

## 2018-02-01 DIAGNOSIS — I351 Nonrheumatic aortic (valve) insufficiency: Secondary | ICD-10-CM | POA: Insufficient documentation

## 2018-02-01 DIAGNOSIS — R000 Tachycardia, unspecified: Secondary | ICD-10-CM | POA: Insufficient documentation

## 2018-02-01 DIAGNOSIS — I272 Pulmonary hypertension, unspecified: Secondary | ICD-10-CM | POA: Diagnosis not present

## 2018-02-01 DIAGNOSIS — E782 Mixed hyperlipidemia: Secondary | ICD-10-CM | POA: Insufficient documentation

## 2018-02-01 DIAGNOSIS — G4733 Obstructive sleep apnea (adult) (pediatric): Secondary | ICD-10-CM | POA: Diagnosis not present

## 2018-02-01 DIAGNOSIS — K219 Gastro-esophageal reflux disease without esophagitis: Secondary | ICD-10-CM | POA: Insufficient documentation

## 2018-02-01 DIAGNOSIS — H8110 Benign paroxysmal vertigo, unspecified ear: Secondary | ICD-10-CM | POA: Insufficient documentation

## 2018-02-01 DIAGNOSIS — R739 Hyperglycemia, unspecified: Secondary | ICD-10-CM | POA: Insufficient documentation

## 2018-02-01 DIAGNOSIS — I5189 Other ill-defined heart diseases: Secondary | ICD-10-CM | POA: Diagnosis not present

## 2018-02-01 LAB — ECHOCARDIOGRAM COMPLETE
AVLVOTPG: 4 mmHg
AVPHT: 438 ms
CHL CUP DOP CALC LVOT VTI: 22 cm
CHL CUP RV SYS PRESS: 15 mmHg
CHL CUP STROKE VOLUME: 61 mL
EERAT: 7.4
EWDT: 257 ms
FS: 33 % (ref 28–44)
IVS/LV PW RATIO, ED: 0.92
LA ID, A-P, ES: 37 mm
LA vol index: 20.5 mL/m2
LA vol: 42.6 mL
LADIAMINDEX: 1.78 cm/m2
LAVOLA4C: 33.3 mL
LEFT ATRIUM END SYS DIAM: 37 mm
LV E/e' medial: 7.4
LV E/e'average: 7.4
LV PW d: 9.68 mm — AB (ref 0.6–1.1)
LV SIMPSON'S DISK: 61
LV TDI E'MEDIAL: 6.31
LV dias vol: 99 mL (ref 46–106)
LVDIAVOLIN: 48 mL/m2
LVELAT: 11 cm/s
LVOT SV: 76 mL
LVOT area: 3.46 cm2
LVOTD: 21 mm
LVOTPV: 103 cm/s
LVSYSVOL: 38 mL (ref 14–42)
LVSYSVOLIN: 19 mL/m2
MV Dec: 257
MV Peak grad: 3 mmHg
MV pk A vel: 108 m/s
MVPKEVEL: 81.4 m/s
RV LATERAL S' VELOCITY: 11.2 cm/s
Reg peak vel: 175 cm/s
TAPSE: 22.6 mm
TDI e' lateral: 11
TRMAXVEL: 175 cm/s

## 2018-02-01 NOTE — Progress Notes (Signed)
*  PRELIMINARY RESULTS* Echocardiogram 2D Echocardiogram has been performed.  [name] Jensen 02/01/2018, 3:51 PM

## 2018-02-04 ENCOUNTER — Other Ambulatory Visit: Payer: Self-pay

## 2018-02-04 DIAGNOSIS — R0602 Shortness of breath: Secondary | ICD-10-CM

## 2018-02-04 DIAGNOSIS — K2100 Gastro-esophageal reflux disease with esophagitis, without bleeding: Secondary | ICD-10-CM

## 2018-02-04 DIAGNOSIS — K449 Diaphragmatic hernia without obstruction or gangrene: Secondary | ICD-10-CM

## 2018-02-06 ENCOUNTER — Ambulatory Visit (INDEPENDENT_AMBULATORY_CARE_PROVIDER_SITE_OTHER): Payer: 59 | Admitting: Internal Medicine

## 2018-02-06 ENCOUNTER — Encounter (INDEPENDENT_AMBULATORY_CARE_PROVIDER_SITE_OTHER): Payer: Self-pay | Admitting: Internal Medicine

## 2018-02-06 ENCOUNTER — Other Ambulatory Visit (INDEPENDENT_AMBULATORY_CARE_PROVIDER_SITE_OTHER): Payer: 59

## 2018-02-06 VITALS — BP 138/80 | HR 76 | Temp 98.5°F | Ht 64.0 in | Wt 197.4 lb

## 2018-02-06 DIAGNOSIS — K219 Gastro-esophageal reflux disease without esophagitis: Secondary | ICD-10-CM | POA: Diagnosis not present

## 2018-02-06 DIAGNOSIS — R000 Tachycardia, unspecified: Secondary | ICD-10-CM

## 2018-02-06 DIAGNOSIS — R739 Hyperglycemia, unspecified: Secondary | ICD-10-CM | POA: Diagnosis not present

## 2018-02-06 DIAGNOSIS — E782 Mixed hyperlipidemia: Secondary | ICD-10-CM

## 2018-02-06 DIAGNOSIS — E559 Vitamin D deficiency, unspecified: Secondary | ICD-10-CM | POA: Diagnosis not present

## 2018-02-06 LAB — LIPID PANEL
CHOL/HDL RATIO: 4
CHOLESTEROL: 194 mg/dL (ref 0–200)
HDL: 48.4 mg/dL (ref >39.00)
LDL CALC: 112 mg/dL — AB (ref 0–99)
NONHDL: 145.81
Triglycerides: 170 mg/dL — ABNORMAL HIGH (ref 0.0–149.0)
VLDL: 34 mg/dL (ref 0.0–40.0)

## 2018-02-06 LAB — COMPREHENSIVE METABOLIC PANEL
ALBUMIN: 4.1 g/dL (ref 3.5–5.2)
ALT: 16 U/L (ref 0–35)
AST: 12 U/L (ref 0–37)
Alkaline Phosphatase: 70 U/L (ref 39–117)
BUN: 11 mg/dL (ref 6–23)
CHLORIDE: 106 meq/L (ref 96–112)
CO2: 28 meq/L (ref 19–32)
Calcium: 9.3 mg/dL (ref 8.4–10.5)
Creatinine, Ser: 0.72 mg/dL (ref 0.40–1.20)
GFR: 87.96 mL/min (ref >60.00)
GLUCOSE: 117 mg/dL — AB (ref 70–99)
POTASSIUM: 3.9 meq/L (ref 3.5–5.1)
SODIUM: 140 meq/L (ref 135–145)
Total Bilirubin: 0.4 mg/dL (ref 0.2–1.2)
Total Protein: 7.1 g/dL (ref 6.0–8.3)

## 2018-02-06 LAB — HEMOGLOBIN A1C: Hgb A1c MFr Bld: 6.1 % (ref 4.6–6.5)

## 2018-02-06 LAB — CBC
HEMATOCRIT: 38.5 % (ref 36.0–46.0)
HEMOGLOBIN: 13.1 g/dL (ref 12.0–15.0)
MCHC: 34.2 g/dL (ref 30.0–36.0)
MCV: 82.8 fl (ref 78.0–100.0)
Platelets: 287 10*3/uL (ref 150.0–400.0)
RBC: 4.65 Mil/uL (ref 3.87–5.11)
RDW: 13.8 % (ref 11.5–15.5)
WBC: 6.1 10*3/uL (ref 4.0–10.5)

## 2018-02-06 LAB — VITAMIN D 25 HYDROXY (VIT D DEFICIENCY, FRACTURES): VITD: 28.54 ng/mL — AB (ref 30.00–100.00)

## 2018-02-06 LAB — TSH: TSH: 1.41 u[IU]/mL (ref 0.35–4.50)

## 2018-02-06 NOTE — Progress Notes (Signed)
Subjective:    Patient ID: [name] Jensen, female    DOB: 1956/06/16, 62 y.o.   MRN: [id number]  HPI  Referred by Dr. Charlett Blake for hiatal hernia, GERD. She states today she has been having chest pain. Chest CT in February of this year showed a hiatal hernia CT was normal).  Echo March 2019 EF 55-60%.   She started the Protonix in the past month and has been taking Zantac for about a year.  She said she had projectile vomiting about a month ago.  She says she is Lactulose intolerant. She says she has acid in her mouth.  She also c/o frequent diarrhea after she eats. She thinks she probably has IBS. This is not a new symptom. She avoid carbonated drinks, tea, tomatoes, peppers, onions.  Her appetite is good. No weight loss.     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      Last EGD in 2006 by Dr. Watt Climes . Small hernia found.   Review of Systems Past Medical History:  Diagnosis Date  . Adjustment reaction with anxiety and depression 06/12/2014  . Anemia    h/o low ferritin  . Anxiety   . Asthma    environmental triggers  . Chicken pox as a child  . Cough 08/22/2015  . Depression   . Depression with anxiety 06/12/2014  . Diverticulosis   . Ehrlichiosis 4818  . Gallstones   . GERD (gastroesophageal reflux disease)   . Hair loss 03/28/2017  . Hiatal hernia 2007  . History of viral illness   . Hyperlipidemia, mixed 04/11/2015  . Insomnia 08/21/2017  . Lactose intolerance 06/12/2014  . Mumps as a child  . Overweight 08/19/2014  . Rectocele 06/12/2014  . RLS (restless legs syndrome)   . 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 Sessile colonic polyp   . SVT (supraventricular tachycardia) ([place])   . Tachycardia 06/12/2014  . Thyroid disease   . Vitamin D deficiency 08/22/2015    Past Surgical History:  Procedure Laterality Date  . ABDOMINAL HYSTERECTOMY  11-2008   total with cystocele, rectocele repair  . APPENDECTOMY    . CHOLECYSTECTOMY    . COLONOSCOPY   2006   diverticulosis, hemorrhoids ([place])  . ESOPHAGOGASTRODUODENOSCOPY  2006   small hiatus hernia ([place])  . INCONTINENCE SURGERY    . SKIN SURGERY     right arm scc  . svt ablation    . TONSILLECTOMY  1972   2ith adenoid  . WISDOM TOOTH EXTRACTION  62 yrs old    Allergies  Allergen Reactions  . Dilaudid [Hydromorphone Hcl]     Respiratory , cardiac     Current Outpatient Medications on File Prior to Visit  Medication Sig Dispense Refill  . albuterol (PROVENTIL HFA;VENTOLIN HFA) 108 (90 Base) MCG/ACT inhaler Inhale 2 puffs into the lungs every 6 (six) hours as needed for wheezing. 1 Inhaler 6  . aspirin (ASPIRIN EC) 81 MG EC tablet Take 81 mg by mouth at bedtime.     . beclomethasone (QVAR) 80 MCG/ACT inhaler Inhale 1 puff into the lungs daily. 1 Inhaler 6  . Cholecalciferol (VITAMIN D3) 2000 units TABS Take 1 tablet by mouth daily.    Marland Kitchen LORazepam (ATIVAN) 1 MG tablet TAKE 1/2 TO 1 TABLET BY MOUTH EVERY 8 HOURS AS NEEDED FOR ANXIETY 70 tablet 2  . metoprolol succinate (TOPROL XL) 100 MG 24 hr tablet Take 1 tablet (100 mg total) by mouth  2 (two) times daily. Take with or immediately following a meal. 180 tablet 3  . nystatin cream (MYCOSTATIN) Apply 1 application topically 2 (two) times daily. 30 g 2  . Omega-3 Fatty Acids (FISH OIL) 1000 MG CAPS Take 2 capsules by mouth 2 (two) times daily.    . pantoprazole (PROTONIX) 40 MG tablet Take 1 tablet (40 mg total) by mouth daily. 90 tablet 1  . ranitidine (ZANTAC) 300 MG tablet TAKE 1 TABLET BY MOUTH ONCE DAILY AT BEDTIME AS NEEDED FOR HEARTBURN 30 tablet 0  . Spacer/Aero Chamber Mouthpiece MISC Use with Albuterol HFA as directed 1 each 1  . temazepam (RESTORIL) 30 MG capsule TAKE 1 CAPSULE BY MOUTH ONCE AT BEDTIME 30 capsule 2  . traZODone (DESYREL) 150 MG tablet Take 0.5 tablets (75 mg total) by mouth at bedtime. 90 tablet 1  . triamcinolone cream (KENALOG) 0.1 % Apply 1 application topically 3 (three) times daily. Apply to the  affected area twice daily. 15 g 1   No current facility-administered medications on file prior to visit.         Objective:   Physical Exam Blood pressure 138/80, pulse 76, temperature 98.5 F (36.9 C), height 5\' 4"  (1.626 m), weight 197 lb 6.4 oz (89.5 kg). Alert and oriented. Skin warm and dry. Oral mucosa is moist.   . Sclera anicteric, conjunctivae is pink. Thyroid not enlarged. No cervical lymphadenopathy. Lungs clear. Heart regular rate and rhythm.  Abdomen is soft. Bowel sounds are positive. No hepatomegaly. No abdominal masses felt. No tenderness.  No edema to lower extremities.         Assessment & Plan:  GERD. Continue the Protonix and Zantac. Will schedule and EGD with Dr. Laural Golden.'

## 2018-02-06 NOTE — Patient Instructions (Addendum)
Food Choices for Gastroesophageal Reflux Disease, Adult When you have gastroesophageal reflux disease (GERD), the foods you eat and your eating habits are very important. Choosing the right foods can help ease your discomfort. What guidelines do I need to follow?  Choose fruits, vegetables, whole grains, and low-fat dairy products.  Choose low-fat meat, fish, and poultry.  Limit fats such as oils, salad dressings, butter, nuts, and avocado.  Keep a food diary. This helps you identify foods that cause symptoms.  Avoid foods that cause symptoms. These may be different for everyone.  Eat small meals often instead of 3 large meals a day.  Eat your meals slowly, in a place where you are relaxed.  Limit fried foods.  Cook foods using methods other than frying.  Avoid drinking alcohol.  Avoid drinking large amounts of liquids with your meals.  Avoid bending over or lying down until 2-3 hours after eating. What foods are not recommended? These are some foods and drinks that may make your symptoms worse: Vegetables Tomatoes. Tomato juice. Tomato and spaghetti sauce. Chili peppers. Onion and garlic. Horseradish. Fruits Oranges, grapefruit, and lemon (fruit and juice). Meats High-fat meats, fish, and poultry. This includes hot dogs, ribs, ham, sausage, salami, and bacon. Dairy Whole milk and chocolate milk. Sour cream. Cream. Butter. Ice cream. Cream cheese. Drinks Coffee and tea. Bubbly (carbonated) drinks or energy drinks. Condiments Hot sauce. Barbecue sauce. Sweets/Desserts Chocolate and cocoa. Donuts. Peppermint and spearmint. Fats and Oils High-fat foods. This includes French fries and potato chips. Other Vinegar. Strong spices. This includes black pepper, white pepper, red pepper, cayenne, curry powder, cloves, ginger, and chili powder. The items listed above may not be a complete list of foods and drinks to avoid. Contact your dietitian for more information. This  information is not intended to replace advice given to you by your health care provider. Make sure you discuss any questions you have with your health care provider. Document Released: 05/21/2012 Document Revised: 04/27/2016 Document Reviewed: 09/24/2013 Elsevier Interactive Patient Education  2017 Elsevier Inc.  Gastroesophageal Reflux Disease, Adult Normally, food travels down the esophagus and stays in the stomach to be digested. If a person has gastroesophageal reflux disease (GERD), food and stomach acid move back up into the esophagus. When this happens, the esophagus becomes sore and swollen (inflamed). Over time, GERD can make small holes (ulcers) in the lining of the esophagus. Follow these instructions at home: Diet  Follow a diet as told by your doctor. You may need to avoid foods and drinks such as: ? Coffee and tea (with or without caffeine). ? Drinks that contain alcohol. ? Energy drinks and sports drinks. ? Carbonated drinks or sodas. ? Chocolate and cocoa. ? Peppermint and mint flavorings. ? Garlic and onions. ? Horseradish. ? Spicy and acidic foods, such as peppers, chili powder, curry powder, vinegar, hot sauces, and BBQ sauce. ? Citrus fruit juices and citrus fruits, such as oranges, lemons, and limes. ? Tomato-based foods, such as red sauce, chili, salsa, and pizza with red sauce. ? Fried and fatty foods, such as donuts, french fries, potato chips, and high-fat dressings. ? High-fat meats, such as hot dogs, rib eye steak, sausage, ham, and bacon. ? High-fat dairy items, such as whole milk, butter, and cream cheese.  Eat small meals often. Avoid eating large meals.  Avoid drinking large amounts of liquid with your meals.  Avoid eating meals during the 2-3 hours before bedtime.  Avoid lying down right after you eat.  Do   not exercise right after you eat. General instructions  Pay attention to any changes in your symptoms.  Take over-the-counter and prescription  medicines only as told by your doctor. Do not take aspirin, ibuprofen, or other NSAIDs unless your doctor says it is okay.  Do not use any tobacco products, including cigarettes, chewing tobacco, and e-cigarettes. If you need help quitting, ask your doctor.  Wear loose clothes. Do not wear anything tight around your waist.  Raise (elevate) the head of your bed about 6 inches (15 cm).  Try to lower your stress. If you need help doing this, ask your doctor.  If you are overweight, lose an amount of weight that is healthy for you. Ask your doctor about a safe weight loss goal.  Keep all follow-up visits as told by your doctor. This is important. Contact a doctor if:  You have new symptoms.  You lose weight and you do not know why it is happening.  You have trouble swallowing, or it hurts to swallow.  You have wheezing or a cough that keeps happening.  Your symptoms do not get better with treatment.  You have a hoarse voice. Get help right away if:  You have pain in your arms, neck, jaw, teeth, or back.  You feel sweaty, dizzy, or light-headed.  You have chest pain or shortness of breath.  You throw up (vomit) and your throw up looks like blood or coffee grounds.  You pass out (faint).  Your poop (stool) is bloody or black.  You cannot swallow, drink, or eat. This information is not intended to replace advice given to you by your health care provider. Make sure you discuss any questions you have with your health care provider. Document Released: 05/08/2008 Document Revised: 04/27/2016 Document Reviewed: 03/17/2015 Elsevier Interactive Patient Education  2018 Elsevier Inc.  

## 2018-02-08 ENCOUNTER — Other Ambulatory Visit (INDEPENDENT_AMBULATORY_CARE_PROVIDER_SITE_OTHER): Payer: Self-pay | Admitting: Internal Medicine

## 2018-02-08 DIAGNOSIS — K219 Gastro-esophageal reflux disease without esophagitis: Secondary | ICD-10-CM

## 2018-02-11 ENCOUNTER — Encounter (INDEPENDENT_AMBULATORY_CARE_PROVIDER_SITE_OTHER): Payer: Self-pay | Admitting: *Deleted

## 2018-02-18 ENCOUNTER — Other Ambulatory Visit: Payer: Self-pay | Admitting: Family Medicine

## 2018-02-18 MED FILL — raNITIdine HCL 300 MG TABS: 300 | 30 days supply | Qty: 30 | Fill #0

## 2018-02-18 MED FILL — LORazepam 1 MG TABS: 1 | 23 days supply | Qty: 70 | Fill #2

## 2018-02-19 ENCOUNTER — Ambulatory Visit: Payer: 59 | Admitting: Family Medicine

## 2018-02-19 MED FILL — TEMAZEPAM 30 MG CAPSULE: 30 | 30 days supply | Qty: 30 | Fill #1

## 2018-02-21 ENCOUNTER — Ambulatory Visit (HOSPITAL_COMMUNITY)
Admission: RE | Admit: 2018-02-21 | Discharge: 2018-02-21 | Disposition: A | Discharge status: Home / Self Care | Payer: 59 | Source: Ambulatory Visit | Attending: Internal Medicine | Admitting: Internal Medicine

## 2018-02-21 ENCOUNTER — Encounter (HOSPITAL_COMMUNITY): Payer: Self-pay

## 2018-02-21 ENCOUNTER — Encounter (HOSPITAL_COMMUNITY)
Admission: RE | Disposition: A | Discharge status: Home / Self Care | Payer: Self-pay | Source: Ambulatory Visit | Attending: Internal Medicine

## 2018-02-21 ENCOUNTER — Other Ambulatory Visit: Payer: Self-pay

## 2018-02-21 DIAGNOSIS — E079 Disorder of thyroid, unspecified: Secondary | ICD-10-CM | POA: Diagnosis not present

## 2018-02-21 DIAGNOSIS — Z79899 Other long term (current) drug therapy: Secondary | ICD-10-CM | POA: Diagnosis not present

## 2018-02-21 DIAGNOSIS — K2960 Other gastritis without bleeding: Secondary | ICD-10-CM | POA: Insufficient documentation

## 2018-02-21 DIAGNOSIS — E782 Mixed hyperlipidemia: Secondary | ICD-10-CM | POA: Insufficient documentation

## 2018-02-21 DIAGNOSIS — E559 Vitamin D deficiency, unspecified: Secondary | ICD-10-CM | POA: Insufficient documentation

## 2018-02-21 DIAGNOSIS — Z885 Allergy status to narcotic agent status: Secondary | ICD-10-CM | POA: Insufficient documentation

## 2018-02-21 DIAGNOSIS — G2581 Restless legs syndrome: Secondary | ICD-10-CM | POA: Insufficient documentation

## 2018-02-21 DIAGNOSIS — K2970 Gastritis, unspecified, without bleeding: Secondary | ICD-10-CM

## 2018-02-21 DIAGNOSIS — K210 Gastro-esophageal reflux disease with esophagitis: Secondary | ICD-10-CM | POA: Diagnosis not present

## 2018-02-21 DIAGNOSIS — K219 Gastro-esophageal reflux disease without esophagitis: Secondary | ICD-10-CM | POA: Diagnosis not present

## 2018-02-21 DIAGNOSIS — F419 Anxiety disorder, unspecified: Secondary | ICD-10-CM | POA: Insufficient documentation

## 2018-02-21 DIAGNOSIS — K319 Disease of stomach and duodenum, unspecified: Secondary | ICD-10-CM | POA: Insufficient documentation

## 2018-02-21 DIAGNOSIS — K449 Diaphragmatic hernia without obstruction or gangrene: Secondary | ICD-10-CM | POA: Diagnosis not present

## 2018-02-21 DIAGNOSIS — Z86010 Personal history of colonic polyps: Secondary | ICD-10-CM | POA: Insufficient documentation

## 2018-02-21 DIAGNOSIS — E739 Lactose intolerance, unspecified: Secondary | ICD-10-CM | POA: Diagnosis not present

## 2018-02-21 DIAGNOSIS — J45909 Unspecified asthma, uncomplicated: Secondary | ICD-10-CM | POA: Diagnosis not present

## 2018-02-21 DIAGNOSIS — Z7982 Long term (current) use of aspirin: Secondary | ICD-10-CM | POA: Diagnosis not present

## 2018-02-21 DIAGNOSIS — G4700 Insomnia, unspecified: Secondary | ICD-10-CM | POA: Diagnosis not present

## 2018-02-21 DIAGNOSIS — K228 Other specified diseases of esophagus: Secondary | ICD-10-CM | POA: Diagnosis not present

## 2018-02-21 DIAGNOSIS — K2900 Acute gastritis without bleeding: Secondary | ICD-10-CM | POA: Diagnosis not present

## 2018-02-21 HISTORY — PX: ESOPHAGOGASTRODUODENOSCOPY: SHX5428

## 2018-02-21 SURGERY — EGD (ESOPHAGOGASTRODUODENOSCOPY)
Anesthesia: Moderate Sedation

## 2018-02-21 MED ORDER — MIDAZOLAM HCL 5 MG/5ML IJ SOLN
INTRAMUSCULAR | Status: AC
  Filled 2018-02-21: qty 10

## 2018-02-21 MED ORDER — MEPERIDINE HCL 50 MG/ML IJ SOLN
INTRAMUSCULAR | Status: AC
  Filled 2018-02-21: qty 1

## 2018-02-21 MED ORDER — MEPERIDINE HCL 50 MG/ML IJ SOLN
INTRAMUSCULAR | Status: DC | PRN
  Administered 2018-02-21 (×4): 25 mg

## 2018-02-21 MED ORDER — LIDOCAINE VISCOUS 2 % MT SOLN
OROMUCOSAL | Status: DC | PRN
  Administered 2018-02-21: 1 via OROMUCOSAL

## 2018-02-21 MED ORDER — MIDAZOLAM HCL 5 MG/5ML IJ SOLN
INTRAMUSCULAR | Status: DC | PRN
  Administered 2018-02-21: 3 mg via INTRAVENOUS
  Administered 2018-02-21 (×2): 2 mg via INTRAVENOUS
  Administered 2018-02-21: 3 mg via INTRAVENOUS

## 2018-02-21 MED ORDER — LANSOPRAZOLE 30 MG PO CPDR: 30.0000 mg | DELAYED_RELEASE_CAPSULE | Freq: Every day | ORAL | 5 refills | Status: DC

## 2018-02-21 MED ORDER — LIDOCAINE VISCOUS 2 % MT SOLN
OROMUCOSAL | Status: AC
  Filled 2018-02-21: qty 15

## 2018-02-21 MED ORDER — SODIUM CHLORIDE 0.9 % IV SOLN
INTRAVENOUS | Status: DC
  Administered 2018-02-21: 15:00:00 via INTRAVENOUS

## 2018-02-21 MED ORDER — STERILE WATER FOR IRRIGATION IR SOLN
Status: DC | PRN
  Administered 2018-02-21: 15 mL

## 2018-02-21 MED FILL — LANSOPRAZOLE DR 30 MG CAP: 30 | 30 days supply | Qty: 30 | Fill #0

## 2018-02-21 NOTE — Op Note (Signed)
[facility] Patient Name: [name] Jensen Procedure Date: 02/21/2018 3:57 PM MRN: [id number] Date of Birth: [birth year] Attending MD: Hildred Laser , MD CSN: [id number] Age: 62 Admit Type: Outpatient Procedure:                Upper GI endoscopy Indications:              Follow-up of gastro-esophageal reflux disease Providers:                Hildred Laser, MD, Charlsie Quest. Joanne Gavel, RN,                            Randa Spike, Technician Referring MD:             Bonnita Levan. Charlett Blake, MD Medicines:                Lidocaine spray, Meperidine 100 mg IV, Midazolam 10                            mg IV Complications:            No immediate complications. Estimated Blood Loss:     Estimated blood loss was minimal. Procedure:                Pre-Anesthesia Assessment:                           - Prior to the procedure, a History and Physical                            was performed, and patient medications and                            allergies were reviewed. The patient's tolerance of                            previous anesthesia was also reviewed. The risks                            and benefits of the procedure and the sedation                            options and risks were discussed with the patient.                            All questions were answered, and informed consent                            was obtained. Prior Anticoagulants: The patient                            last took aspirin 4 days prior to the procedure.                            ASA Grade Assessment: II - A patient with mild  systemic disease. After reviewing the risks and                            benefits, the patient was deemed in satisfactory                            condition to undergo the procedure.                           After obtaining informed consent, the endoscope was                            passed under direct vision. Throughout the   procedure, the patient's blood pressure, pulse, and                            oxygen saturations were monitored continuously. The                            EG29-[id number] ([id number]) scope was introduced through the                            mouth, and advanced to the second part of duodenum.                            The upper GI endoscopy was accomplished without                            difficulty. The patient tolerated the procedure                            well. Scope In: 4:25:54 PM Scope Out: 4:33:50 PM Total Procedure Duration: 0 hours 7 minutes 56 seconds  Findings:      The proximal esophagus and mid esophagus were normal.      LA Grade A (one or more mucosal breaks less than 5 mm, not extending       between tops of 2 mucosal folds) esophagitis was found 32 to 33 cm from       the incisors.      The Z-line was irregular and was found 33 cm from the incisors.      A 2 cm hiatal hernia was present.      Patchy minimal inflammation characterized by congestion (edema) and       erythema with nodular mucosa was found in the gastric antrum. Biopsies       were taken with a cold forceps for histology.      The exam of the stomach was otherwise normal.      The duodenal bulb and second portion of the duodenum were normal. Impression:               - Normal proximal esophagus and mid esophagus.                           - LA Grade A reflux esophagitis.                           -  Z-line irregular, 33 cm from the incisors.                           - 2 cm hiatal hernia.                           - Gastritis. Biopsied.                           - Normal duodenal bulb and second portion of the                            duodenum. Moderate Sedation:      Moderate (conscious) sedation was administered by the endoscopy nurse       and supervised by the endoscopist. The following parameters were       monitored: oxygen saturation, heart rate, blood pressure, CO2       capnography and  response to care. Total physician intraservice time was       17 minutes. Recommendation:           - Patient has a contact number available for                            emergencies. The signs and symptoms of potential                            delayed complications were discussed with the                            patient. Return to normal activities tomorrow.                            Written discharge instructions were provided to the                            patient.                           - Resume previous diet today.                           - Continue present medications but discontinue                            Pantoprazole.                           - Lansoprazole 30 mg po qam.                           - Await pathology results.                           - Return to GI clinic in 3 months. Procedure Code(s):        --- Professional ---  44967, Esophagogastroduodenoscopy, flexible,                            transoral; with biopsy, single or multiple                           99152, Moderate sedation services provided by the                            same physician or other qualified health care                            professional performing the diagnostic or                            therapeutic service that the sedation supports,                            requiring the presence of an independent trained                            observer to assist in the monitoring of the                            patient's level of consciousness and physiological                            status; initial 15 minutes of intraservice time,                            patient age 25 years or older Diagnosis Code(s):        --- Professional ---                           K21.0, Gastro-esophageal reflux disease with                            esophagitis                           K22.8, Other specified diseases of esophagus                            K44.9, Diaphragmatic hernia without obstruction or                            gangrene                           K29.70, Gastritis, unspecified, without bleeding CPT copyright 2016 American Medical Association. All rights reserved. The codes documented in this report are preliminary and upon coder review may  be revised to meet current compliance requirements. Hildred Laser, MD Hildred Laser, MD 02/21/2018 4:49:39 PM This report has been signed electronically. Number of Addenda: 0

## 2018-02-21 NOTE — H&P (Addendum)
[name] Jensen is an 62 y.o. female.   Chief Complaint: Patient is here for EGD. HPI: This 62 year old Caucasian female who has chronic GERD.  She had EGD 14 years ago and was noted to have small sliding hernia.  She has had increasing problems with heartburn chest pain and regurgitation particularly at night times a week.  Prilosec does not work anymore.  She is on pantoprazole in the morning and ranitidine 300 mg at bedtime and some symptom control is not satisfactory.  She denies nausea vomiting or dysphagia.  She states she began to gain weight a few years ago but she has quit drinking sweet tea.  She states she has gained close to 50 pounds in the last 2 years. She does not smoke cigarettes may have a drink occasionally.  Past Medical History:  Diagnosis Date  . Adjustment reaction with anxiety and depression 06/12/2014  . Anemia    h/o low ferritin  . Anxiety   . Asthma    environmental triggers  . Chicken pox as a child  .  08/22/2015  .    Marland Kitchen  06/12/2014  . Diverticulosis   . Ehrlichiosis 9798      . GERD (gastroesophageal reflux disease)   . Hair loss 03/28/2017  . Hiatal hernia 2007  . History of viral illness   . Hyperlipidemia, mixed 04/11/2015  . Insomnia 08/21/2017  . Lactose intolerance 06/12/2014  . Mumps as a child  . Overweight 08/19/2014  . Rectocele 06/12/2014  . RLS (restless legs syndrome)   . 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 Sessile colonic polyp   . SVT (supraventricular tachycardia) ([place])   . Tachycardia 06/12/2014  . Thyroid disease   . Vitamin D deficiency 08/22/2015    Past Surgical History:  Procedure Laterality Date  . ABDOMINAL HYSTERECTOMY  11-2008   total with cystocele, rectocele repair  . APPENDECTOMY    . CHOLECYSTECTOMY    . COLONOSCOPY  2006   diverticulosis, hemorrhoids ([place])  . ESOPHAGOGASTRODUODENOSCOPY  2006   small hiatus hernia ([place])  . INCONTINENCE SURGERY    . SKIN SURGERY     right arm  scc  . svt ablation    . TONSILLECTOMY  1972   2ith adenoid  . WISDOM TOOTH EXTRACTION  62 yrs old    Family History  Problem Relation Age of Onset  . Hypertension Mother   . Hypertension Father   . Hypertension Sister   . Diabetes Sister        type 2  . Fibromyalgia Sister   . GI Bleed Maternal Grandmother   . Atrial fibrillation Maternal Grandmother   . Heart disease Maternal Grandfather   . Diabetes Paternal Grandmother   . Stroke Paternal Grandfather   . Proteinuria Sister    Social History:  reports that she has never smoked. She has never used smokeless tobacco. She reports that she does not drink alcohol or use drugs.  Allergies:  Allergies  Allergen Reactions  . Dilaudid [Hydromorphone Hcl] Other (See Comments)    Respiratory , cardiac     Medications Prior to Admission  Medication Sig Dispense Refill  . acetaminophen (TYLENOL) 325 MG tablet Take 325 mg by mouth every 6 (six) hours as needed for moderate pain or headache.    . albuterol (PROVENTIL HFA;VENTOLIN HFA) 108 (90 Base) MCG/ACT inhaler Inhale 2 puffs into the lungs every 6 (six) hours as needed for wheezing. 1 Inhaler 6  .  aspirin (ASPIRIN EC) 81 MG EC tablet Take 81 mg by mouth at bedtime.     . beclomethasone (QVAR) 80 MCG/ACT inhaler Inhale 1 puff into the lungs daily. (Patient taking differently: Inhale 1 puff into the lungs daily as needed (for shortness of breath or wheezing). ) 1 Inhaler 6  . Cholecalciferol (VITAMIN D3) 5000 units CAPS Take 5,000 Units by mouth daily.     Marland Kitchen LORazepam (ATIVAN) 1 MG tablet TAKE 1/2 TO 1 TABLET BY MOUTH EVERY 8 HOURS AS NEEDED FOR ANXIETY (Patient taking differently: Take 0.5-1 mg by mouth every 8 (eight) hours as needed for anxiety. ) 70 tablet 2  . metoprolol succinate (TOPROL XL) 100 MG 24 hr tablet Take 1 tablet (100 mg total) by mouth 2 (two) times daily. Take with or immediately following a meal. 180 tablet 3  . Omega-3 Fatty Acids (FISH OIL) 1000 MG CAPS Take  2,000 mg by mouth 3 (three) times a week.     . pantoprazole (PROTONIX) 40 MG tablet Take 1 tablet (40 mg total) by mouth daily. 90 tablet 1  . ranitidine (ZANTAC) 300 MG tablet TAKE 1 TABLET BY MOUTH AT BEDTIME AS NEEDED FOR HEARTBURN 30 tablet 0  . temazepam (RESTORIL) 30 MG capsule TAKE 1 CAPSULE BY MOUTH ONCE AT BEDTIME (Patient taking differently: TAKE 30 MG BY MOUTH ONCE AT BEDTIME) 30 capsule 2  . traZODone (DESYREL) 150 MG tablet Take 0.5 tablets (75 mg total) by mouth at bedtime. (Patient taking differently: Take 150 mg by mouth at bedtime. ) 90 tablet 1  . nystatin cream (MYCOSTATIN) Apply 1 application topically 2 (two) times daily. (Patient not taking: Reported on 02/13/2018) 30 g 2  . Spacer/Aero Chamber Mouthpiece MISC Use with Albuterol HFA as directed (Patient not taking: Reported on 02/13/2018) 1 each 1  . triamcinolone cream (KENALOG) 0.1 % Apply 1 application topically 3 (three) times daily. Apply to the affected area twice daily. (Patient not taking: Reported on 02/13/2018) 15 g 1    No results found for this or any previous visit (from the past 48 hour(s)). No results found.  ROS  Blood pressure 130/79, pulse 78, temperature 99 F (37.2 C), temperature source Oral, resp. rate 13, SpO2 97 %. Physical Exam  Constitutional: She appears well-developed and well-nourished.  HENT:  Mouth/Throat: Oropharynx is clear and moist.  Eyes: Conjunctivae are normal. No scleral icterus.  Cardiovascular: Normal rate, regular rhythm and normal heart sounds.  No murmur heard. Respiratory: Effort normal and breath sounds normal.  GI:  Abdomen is full.  Soft and nontender with organomegaly or masses.  Musculoskeletal: She exhibits no edema.  Neurological: She is alert.  Skin: Skin is warm and dry.     Assessment/Plan Refractory GERD. Diagnostic EGD.  Hildred Laser, MD 02/21/2018, 4:09 PM

## 2018-02-21 NOTE — Discharge Instructions (Signed)
Gastroesophageal Reflux Disease, Adult Normally, food travels down the esophagus and stays in the stomach to be digested. If a person has gastroesophageal reflux disease (GERD), food and stomach acid move back up into the esophagus. When this happens, the esophagus becomes sore and swollen (inflamed). Over time, GERD can make small holes (ulcers) in the lining of the esophagus. Follow these instructions at home: Diet  Follow a diet as told by your doctor. You may need to avoid foods and drinks such as: ? Coffee and tea (with or without caffeine). ? Drinks that contain alcohol. ? Energy drinks and sports drinks. ? Carbonated drinks or sodas. ? Chocolate and cocoa. ? Peppermint and mint flavorings. ? Garlic and onions. ? Horseradish. ? Spicy and acidic foods, such as peppers, chili powder, curry powder, vinegar, hot sauces, and BBQ sauce. ? Citrus fruit juices and citrus fruits, such as oranges, lemons, and limes. ? Tomato-based foods, such as red sauce, chili, salsa, and pizza with red sauce. ? Fried and fatty foods, such as donuts, french fries, potato chips, and high-fat dressings. ? High-fat meats, such as hot dogs, rib eye steak, sausage, ham, and bacon. ? High-fat dairy items, such as whole milk, butter, and cream cheese.  Eat small meals often. Avoid eating large meals.  Avoid drinking large amounts of liquid with your meals.  Avoid eating meals during the 2-3 hours before bedtime.  Avoid lying down right after you eat.  Do not exercise right after you eat. General instructions  Pay attention to any changes in your symptoms.  Take over-the-counter and prescription medicines only as told by your doctor. Do not take aspirin, ibuprofen, or other NSAIDs unless your doctor says it is okay.  Do not use any tobacco products, including cigarettes, chewing tobacco, and e-cigarettes. If you need help quitting, ask your doctor.  Wear loose clothes. Do not wear anything tight around  your waist.  Raise (elevate) the head of your bed about 6 inches (15 cm).  Try to lower your stress. If you need help doing this, ask your doctor.  If you are overweight, lose an amount of weight that is healthy for you. Ask your doctor about a safe weight loss goal.  Keep all follow-up visits as told by your doctor. This is important. Contact a doctor if:  You have new symptoms.  You lose weight and you do not know why it is happening.  You have trouble swallowing, or it hurts to swallow.  You have wheezing or a cough that keeps happening.  Your symptoms do not get better with treatment.  You have a hoarse voice. Get help right away if:  You have pain in your arms, neck, jaw, teeth, or back.  You feel sweaty, dizzy, or light-headed.  You have chest pain or shortness of breath.  You throw up (vomit) and your throw up looks like blood or coffee grounds.  You pass out (faint).  Your poop (stool) is bloody or black.  You cannot swallow, drink, or eat. This information is not intended to replace advice given to you by your health care provider. Make sure you discuss any questions you have with your health care provider. Document Released: 05/08/2008 Document Revised: 04/27/2016 Document Reviewed: 03/17/2015 Elsevier Interactive Patient Education  2018 [employer]. Esophagogastroduodenoscopy Esophagogastroduodenoscopy (EGD) is a procedure to examine the lining of the esophagus, stomach, and first part of the small intestine (duodenum). This procedure is done to check for problems such as inflammation, bleeding, ulcers, or growths.  During this procedure, a long, flexible, lighted tube with a camera attached (endoscope) is inserted down the throat. Tell a health care provider about:  Any allergies you have.  All medicines you are taking, including vitamins, herbs, eye drops, creams, and over-the-counter medicines.  Any problems you or family members have had with  anesthetic medicines.  Any blood disorders you have.  Any surgeries you have had.  Any medical conditions you have.  Whether you are pregnant or may be pregnant. What are the risks? Generally, this is a safe procedure. However, problems may occur, including:  Infection.  Bleeding.  A tear (perforation) in the esophagus, stomach, or duodenum.  Trouble breathing.  Excessive sweating.  Spasms of the larynx.  A slowed heartbeat.  Low blood pressure.  What happens before the procedure?  Follow instructions from your health care provider about eating or drinking restrictions.  Ask your health care provider about: ? Changing or stopping your regular medicines. This is especially important if you are taking diabetes medicines or blood thinners. ? Taking medicines such as aspirin and ibuprofen. These medicines can thin your blood. Do not take these medicines before your procedure if your health care provider instructs you not to.  Plan to have someone take you home after the procedure.  If you wear dentures, be ready to remove them before the procedure. What happens during the procedure?  To reduce your risk of infection, your health care team will wash or sanitize their hands.  An IV tube will be put in a vein in your hand or arm. You will get medicines and fluids through this tube.  You will be given one or more of the following: ? A medicine to help you relax (sedative). ? A medicine to numb the area (local anesthetic). This medicine may be sprayed into your throat. It will make you feel more comfortable and keep you from gagging or coughing during the procedure. ? A medicine for pain.  A mouth guard may be placed in your mouth to protect your teeth and to keep you from biting on the endoscope.  You will be asked to lie on your left side.  The endoscope will be lowered down your throat into your esophagus, stomach, and duodenum.  Air will be put into the endoscope.  This will help your health care provider see better.  The lining of your esophagus, stomach, and duodenum will be examined.  Your health care provider may: ? Take a tissue sample so it can be looked at in a lab (biopsy). ? Remove growths. ? Remove objects (foreign bodies) that are stuck. ? Treat any bleeding with medicines or other devices that stop tissue from bleeding. ? Widen (dilate) or stretch narrowed areas of your esophagus and stomach.  The endoscope will be taken out. The procedure may vary among health care providers and hospitals. What happens after the procedure?  Your blood pressure, heart rate, breathing rate, and blood oxygen level will be monitored often until the medicines you were given have worn off.  Do not eat or drink anything until the numbing medicine has worn off and your gag reflex has returned. This information is not intended to replace advice given to you by your health care provider. Make sure you discuss any questions you have with your health care provider. Document Released: 03/23/2005 Document Revised: 04/27/2016 Document Reviewed: 10/14/2015 Elsevier Interactive Patient Education  2018 [place] pantoprazole and begin lansoprazole 30 mg by mouth 30 minutes before breakfast  daily. Can you ranitidine at bedtime as before. Resume usual medications as before. Resume diet as before. No driving for 24 hours. Physician will call with results of biopsy. Office visit in 3 months.

## 2018-02-26 ENCOUNTER — Encounter (HOSPITAL_COMMUNITY): Payer: Self-pay | Admitting: Internal Medicine

## 2018-02-27 ENCOUNTER — Encounter: Payer: Self-pay | Admitting: Podiatry

## 2018-02-27 ENCOUNTER — Ambulatory Visit: Payer: 59 | Admitting: Podiatry

## 2018-02-27 DIAGNOSIS — L600 Ingrowing nail: Secondary | ICD-10-CM | POA: Diagnosis not present

## 2018-02-27 NOTE — Patient Instructions (Signed)

## 2018-02-28 NOTE — Progress Notes (Signed)
Subjective:   Patient ID: [name] Jensen, female   DOB: 62 y.o.   MRN: [id number]   HPI Patient presents stating the right big toenail has been aggravating for her and it started to get sore over the last few months.  The left one was mildly tender but not to the same degree as the right   ROS      Objective:  Physical Exam  Neurovascular status intact with incurvation of the medial border of the right hallux with slight regrowth of nail with scab formation that irritative with no drainage or redness noted in the left wound currently looks healthy     Assessment:  Appears to be ingrown toenail deformity right hallux with scant tissue formation right foot with the left one at this point healthy     Plan:  H&P condition reviewed and recommended we go ahead and remove the corner of his nail and scar tissue.  Patient wants procedure understanding risk and signed consent form.  Today I infiltrated the right hallux 60 mg Xylocaine Marcaine mixture patient had sterile prep of the right foot and toe and then using sterile instrumentation the medial border was removed the area was cleaned out sterile phenol was applied to the area 3 applications 30 seconds followed by alcohol lavage sterile dressing was applied and patient was instructed on reappoint and to call with any issues should

## 2018-03-01 ENCOUNTER — Ambulatory Visit: Payer: 59 | Admitting: Podiatry

## 2018-03-01 ENCOUNTER — Encounter: Payer: Self-pay | Admitting: Podiatry

## 2018-03-01 ENCOUNTER — Ambulatory Visit (INDEPENDENT_AMBULATORY_CARE_PROVIDER_SITE_OTHER): Payer: 59

## 2018-03-01 VITALS — BP 151/87 | HR 82 | Resp 16

## 2018-03-01 DIAGNOSIS — M79674 Pain in right toe(s): Secondary | ICD-10-CM

## 2018-03-01 DIAGNOSIS — L03031 Cellulitis of right toe: Secondary | ICD-10-CM | POA: Diagnosis not present

## 2018-03-01 MED ORDER — CEPHALEXIN 500 MG PO CAPS: 500.0000 mg | ORAL_CAPSULE | Freq: Three times a day (TID) | ORAL | 1 refills | Status: DC

## 2018-03-01 NOTE — Progress Notes (Signed)
Subjective:   Patient ID: [name] Jensen, female   DOB: 62 y.o.   MRN: [id number]   HPI Patient presents stating she is been getting some swelling in her big toe and she was just concerned about that.  States it seems that the ingrown toenail itself is healing well   ROS      Objective:  Physical Exam  Neurovascular status intact with good digital perfusion with patient found to have inflammation of the plantar lateral aspect of the right hallux with what appears to be fluid accumulation with no apparent cause and no proximal edema erythema or drainage noted currently     Assessment:  Difficult to tell if this may be vascular or some form of infective process or other pathology     Plan:  H&P x-ray reviewed and I placed the patient on antibiotic cephalexin 500 mg 3 times daily and dispensed surgical shoe with wedge so she does not bear weight on the forefoot.  If this persists or does not get better in the next few weeks she will be seen back and may require further testing for possible drainage  X-ray indicates there is no signs of calcification or distention of tissue or ostial lysis forming.

## 2018-03-14 ENCOUNTER — Other Ambulatory Visit: Payer: Self-pay | Admitting: Family Medicine

## 2018-03-14 MED FILL — LORazepam 1 MG TABS: 1 | 24 days supply | Qty: 70 | Fill #0

## 2018-03-14 NOTE — Telephone Encounter (Signed)
Requesting: ATIVAN 1 MG Contract:  UDS: 07/19/17 Low Risk Last OV: 12/18/17 Next OV:  04/02/18 Last Refill:  12/18/17 #70  2rf   Please advise

## 2018-03-20 MED FILL — TEMAZEPAM 30 MG CAPSULE: 30 | 30 days supply | Qty: 30 | Fill #2

## 2018-03-28 MED FILL — LANSOPRAZOLE DR 30 MG CAP: 30 | 30 days supply | Qty: 30 | Fill #1

## 2018-03-28 MED FILL — METOPROLOL SUCCINATE ER 100: 100 | 90 days supply | Qty: 180 | Fill #1

## 2018-04-02 ENCOUNTER — Ambulatory Visit: Payer: 59 | Admitting: Family Medicine

## 2018-04-09 ENCOUNTER — Ambulatory Visit: Payer: 59 | Admitting: Family Medicine

## 2018-04-09 ENCOUNTER — Encounter: Payer: Self-pay | Admitting: Family Medicine

## 2018-04-09 VITALS — BP 128/78 | HR 96 | Temp 98.1°F | Resp 18 | Wt 202.4 lb

## 2018-04-09 DIAGNOSIS — R739 Hyperglycemia, unspecified: Secondary | ICD-10-CM

## 2018-04-09 DIAGNOSIS — F418 Other specified anxiety disorders: Secondary | ICD-10-CM

## 2018-04-09 DIAGNOSIS — Z79899 Other long term (current) drug therapy: Secondary | ICD-10-CM | POA: Diagnosis not present

## 2018-04-09 DIAGNOSIS — R000 Tachycardia, unspecified: Secondary | ICD-10-CM | POA: Diagnosis not present

## 2018-04-09 DIAGNOSIS — G4700 Insomnia, unspecified: Secondary | ICD-10-CM | POA: Diagnosis not present

## 2018-04-09 DIAGNOSIS — E782 Mixed hyperlipidemia: Secondary | ICD-10-CM | POA: Diagnosis not present

## 2018-04-09 MED ORDER — LORAZEPAM 1 MG PO TABS: ORAL_TABLET | ORAL | 1 refills | Status: DC

## 2018-04-09 MED ORDER — TEMAZEPAM 30 MG PO CAPS: ORAL_CAPSULE | ORAL | 2 refills | Status: DC

## 2018-04-09 NOTE — Progress Notes (Signed)
Subjective:  I acted as a Education administrator for Dr. Charlett Blake. [name], [place]  Patient ID: [name] Jensen, female    DOB: 02-05-1956, 62 y.o.   MRN: [id number]  No chief complaint on file.   HPI  Patient is in today for follow up and she continues to struggle with anhedonia and fatigue. Has had significant family stressors. No suicidal ideation and she continues to struggle with insomnia. No recent febrile illness or hospitalizations. Denies CP/palp/SOB/HA/congestion/fevers/GI or GU c/o. Taking meds as prescribed  Patient Care Team: [name], MD as PCP - General (Family Medicine)   Past Medical History:  Diagnosis Date  . Adjustment reaction with anxiety and depression 06/12/2014  . Anemia    h/o low ferritin  . Anxiety   . Asthma    environmental triggers  . Chicken pox as a child  . Cough 08/22/2015  . Depression   . Depression with anxiety 06/12/2014  . Diverticulosis   . Ehrlichiosis 9326  . Gallstones   . GERD (gastroesophageal reflux disease)   . Hair loss 03/28/2017  . Hiatal hernia 2007  . History of viral illness   . Hyperlipidemia, mixed 04/11/2015  . Insomnia 08/21/2017  . Lactose intolerance 06/12/2014  . Mumps as a child  . Overweight 08/19/2014  . Rectocele 06/12/2014  . RLS (restless legs syndrome)   . 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 Sessile colonic polyp   . SVT (supraventricular tachycardia) ([place])   . Tachycardia 06/12/2014  . Thyroid disease   . Vitamin D deficiency 08/22/2015    Past Surgical History:  Procedure Laterality Date  . ABDOMINAL HYSTERECTOMY  11-2008   total with cystocele, rectocele repair  . APPENDECTOMY    . CHOLECYSTECTOMY    . COLONOSCOPY  2006   diverticulosis, hemorrhoids ([place])  . ESOPHAGOGASTRODUODENOSCOPY  2006   small hiatus hernia ([place])  . ESOPHAGOGASTRODUODENOSCOPY N/A 02/21/2018   Procedure: ESOPHAGOGASTRODUODENOSCOPY (EGD);  Surgeon: Rogene Houston, MD;  Location: [facility];   Service: Endoscopy;  Laterality: N/A;  pt knows to arrive at 2:45  . INCONTINENCE SURGERY    . SKIN SURGERY     right arm scc  . svt ablation    . TONSILLECTOMY  1972   2ith adenoid  . WISDOM TOOTH EXTRACTION  62 yrs old    Family History  Problem Relation Age of Onset  . Hypertension Mother   . Hypertension Father   . Hypertension Sister   . Diabetes Sister        type 2  . Fibromyalgia Sister   . GI Bleed Maternal Grandmother   . Atrial fibrillation Maternal Grandmother   . Heart disease Maternal Grandfather   . Diabetes Paternal Grandmother   . Stroke Paternal Grandfather   . Proteinuria Sister     Social History   Socioeconomic History  . Marital status: Divorced    Spouse name: Not on file  . Number of children: 2  . Years of education: Not on file  . Highest education level: Not on file  Occupational History  . Occupation: Programmer, multimedia: [employer]  . Financial resource strain: Not on file  . Food insecurity:    Worry: Not on file    Inability: Not on file  . Transportation needs:    Medical: Not on file    Non-medical: Not on file  Tobacco Use  . Smoking status: Never Smoker  . Smokeless  tobacco: Never Used  Substance and Sexual Activity  . Alcohol use: No    Alcohol/week: 0.0 oz  . Drug use: No  . Sexual activity: Not on file    Comment: lives by self with 4 dogs works at [employer], avoids dairy  Lifestyle  . Physical activity:    Days per week: Not on file    Minutes per session: Not on file  . Stress: Not on file  Relationships  . Social connections:    Talks on phone: Not on file    Gets together: Not on file    Attends religious service: Not on file    Active member of club or organization: Not on file    Attends meetings of clubs or organizations: Not on file    Relationship status: Not on file  . Intimate partner violence:    Fear of current or ex partner: Not on file    Emotionally abused: Not on file    Physically  abused: Not on file    Forced sexual activity: Not on file  Other Topics Concern  . Not on file  Social History Narrative  . Not on file    Outpatient Medications Prior to Visit  Medication Sig Dispense Refill  . albuterol (PROVENTIL HFA;VENTOLIN HFA) 108 (90 Base) MCG/ACT inhaler Inhale 2 puffs into the lungs every 6 (six) hours as needed for wheezing. 1 Inhaler 6  . aspirin (ASPIRIN EC) 81 MG EC tablet Take 81 mg by mouth at bedtime.     . beclomethasone (QVAR) 80 MCG/ACT inhaler Inhale 1 puff into the lungs daily. (Patient taking differently: Inhale 1 puff into the lungs daily as needed (for shortness of breath or wheezing). ) 1 Inhaler 6  . Cholecalciferol (VITAMIN D3) 5000 units CAPS Take 5,000 Units by mouth daily.     . lansoprazole (PREVACID) 30 MG capsule Take 1 capsule (30 mg total) by mouth daily before breakfast. 30 capsule 5  . metoprolol succinate (TOPROL XL) 100 MG 24 hr tablet Take 1 tablet (100 mg total) by mouth 2 (two) times daily. Take with or immediately following a meal. 180 tablet 3  . nystatin cream (MYCOSTATIN) Apply 1 application topically 2 (two) times daily. 30 g 2  . Omega-3 Fatty Acids (FISH OIL) 1000 MG CAPS Take 2,000 mg by mouth 3 (three) times a week.     . ranitidine (ZANTAC) 300 MG tablet TAKE 1 TABLET BY MOUTH AT BEDTIME AS NEEDED FOR HEARTBURN 30 tablet 0  . Spacer/Aero Chamber Mouthpiece MISC Use with Albuterol HFA as directed 1 each 1  . traZODone (DESYREL) 150 MG tablet Take 0.5 tablets (75 mg total) by mouth at bedtime. (Patient taking differently: Take 150 mg by mouth at bedtime. ) 90 tablet 1  . acetaminophen (TYLENOL) 325 MG tablet Take 325 mg by mouth every 6 (six) hours as needed for moderate pain or headache.    . cephALEXin (KEFLEX) 500 MG capsule Take 1 capsule (500 mg total) by mouth 3 (three) times daily. 30 capsule 1  . LORazepam (ATIVAN) 1 MG tablet TAKE 1/2 TO 1 TABLET BY MOUTH EVERY 8 HOURS AS NEEDED FOR ANXIETY (Patient taking  differently: Take 0.5-1 mg by mouth every 8 (eight) hours as needed for anxiety. ) 70 tablet 2  . LORazepam (ATIVAN) 1 MG tablet TAKE 1/2 TO 1 TABLET BY MOUTH EVERY 8 HOURS AS NEEDED FOR ANXIETY 70 tablet 0  . temazepam (RESTORIL) 30 MG capsule TAKE 1 CAPSULE BY MOUTH  ONCE AT BEDTIME (Patient taking differently: TAKE 30 MG BY MOUTH ONCE AT BEDTIME) 30 capsule 2  . triamcinolone cream (KENALOG) 0.1 % Apply 1 application topically 3 (three) times daily. Apply to the affected area twice daily. 15 g 1   No facility-administered medications prior to visit.     Allergies  Allergen Reactions  . Dilaudid [Hydromorphone Hcl] Other (See Comments)    Respiratory , cardiac     Review of Systems  Constitutional: Positive for malaise/fatigue. Negative for fever.  HENT: Negative for congestion.   Eyes: Negative for blurred vision.  Respiratory: Negative for shortness of breath.   Cardiovascular: Positive for palpitations. Negative for chest pain and leg swelling.  Gastrointestinal: Negative for abdominal pain, blood in stool and nausea.  Genitourinary: Negative for dysuria and frequency.  Musculoskeletal: Negative for falls.  Skin: Negative for rash.  Neurological: Negative for dizziness, loss of consciousness and headaches.  Endo/Heme/Allergies: Negative for environmental allergies.  Psychiatric/Behavioral: Positive for depression. The patient is nervous/anxious and has insomnia.        Objective:    Physical Exam  Constitutional: She is oriented to person, place, and time. No distress.  HENT:  Head: Normocephalic and atraumatic.  Eyes: Conjunctivae are normal.  Neck: Neck supple. No thyromegaly present.  Cardiovascular: Normal rate, regular rhythm and normal heart sounds.  No murmur heard. Pulmonary/Chest: Effort normal and breath sounds normal. She has no wheezes.  Abdominal: She exhibits no distension and no mass.  Musculoskeletal: She exhibits no edema.  Lymphadenopathy:    She has  no cervical adenopathy.  Neurological: She is alert and oriented to person, place, and time.  Skin: Skin is warm and dry. No rash noted. She is not diaphoretic.  Psychiatric: Judgment normal.    BP 128/78 (BP Location: Left Arm, Patient Position: Sitting, Cuff Size: Normal)   Pulse 96   Temp 98.1 F (36.7 C) (Oral)   Resp 18   Wt 202 lb 6.4 oz (91.8 kg)   SpO2 98%   BMI 34.74 kg/m  Wt Readings from Last 3 Encounters:  04/09/18 202 lb 6.4 oz (91.8 kg)  02/06/18 197 lb 6.4 oz (89.5 kg)  01/30/18 200 lb (90.7 kg)   BP Readings from Last 3 Encounters:  04/09/18 128/78  03/01/18 (!) 151/87  02/21/18 (!) 111/52     Immunization History  Administered Date(s) Administered  . Influenza Split 10/04/2013  . Influenza,inj,Quad PF,6+ Mos 08/21/2017  . Influenza-Unspecified 09/14/2014, 09/08/2015  . Tdap 04/03/2009    Health Maintenance  Topic Date Due  . PAP SMEAR  07/28/1979  . INFLUENZA VACCINE  07/04/2018  . TETANUS/TDAP  04/04/2019  . MAMMOGRAM  09/28/2019  . COLONOSCOPY  11/13/2019  . Hepatitis C Screening  Completed  . HIV Screening  Completed    Lab Results  Component Value Date   WBC 6.1 02/06/2018   HGB 13.1 02/06/2018   HCT 38.5 02/06/2018   PLT 287.0 02/06/2018   GLUCOSE 117 (H) 02/06/2018   CHOL 194 02/06/2018   TRIG 170.0 (H) 02/06/2018   HDL 48.40 02/06/2018   LDLDIRECT 110.0 08/22/2017   LDLCALC 112 (H) 02/06/2018   ALT 16 02/06/2018   AST 12 02/06/2018   NA 140 02/06/2018   K 3.9 02/06/2018   CL 106 02/06/2018   CREATININE 0.72 02/06/2018   BUN 11 02/06/2018   CO2 28 02/06/2018   TSH 1.41 02/06/2018   INR 1.0 11/09/2008   HGBA1C 6.1 02/06/2018    Lab Results  Component  Value Date   TSH 1.41 02/06/2018   Lab Results  Component Value Date   WBC 6.1 02/06/2018   HGB 13.1 02/06/2018   HCT 38.5 02/06/2018   MCV 82.8 02/06/2018   PLT 287.0 02/06/2018   Lab Results  Component Value Date   NA 140 02/06/2018   K 3.9 02/06/2018   CO2 28  02/06/2018   GLUCOSE 117 (H) 02/06/2018   BUN 11 02/06/2018   CREATININE 0.72 02/06/2018   BILITOT 0.4 02/06/2018   ALKPHOS 70 02/06/2018   AST 12 02/06/2018   ALT 16 02/06/2018   PROT 7.1 02/06/2018   ALBUMIN 4.1 02/06/2018   CALCIUM 9.3 02/06/2018   ANIONGAP 12 01/24/2018   GFR 87.96 02/06/2018   Lab Results  Component Value Date   CHOL 194 02/06/2018   Lab Results  Component Value Date   HDL 48.40 02/06/2018   Lab Results  Component Value Date   LDLCALC 112 (H) 02/06/2018   Lab Results  Component Value Date   TRIG 170.0 (H) 02/06/2018   Lab Results  Component Value Date   CHOLHDL 4 02/06/2018   Lab Results  Component Value Date   HGBA1C 6.1 02/06/2018         Assessment & Plan:   Problem List Items Addressed This Visit    Tachycardia    Mild, no changes at this time      Depression with anxiety    She continues to struggle with stress and is tearful she agrees to referral to psychiatry to consider medication changes.       Relevant Medications   LORazepam (ATIVAN) 1 MG tablet   Other Relevant Orders   Ambulatory referral to Psychiatry   Hyperlipidemia, mixed    Encouraged heart healthy diet, increase exercise, avoid trans fats, consider a krill oil cap daily      Hyperglycemia    hgba1c acceptable, minimize simple carbs. Increase exercise as tolerated.       Insomnia    Encouraged good sleep hygiene such as dark, quiet room. No blue/green glowing lights such as computer screens in bedroom. No alcohol or stimulants in evening. Cut down on caffeine as able. Regular exercise is helpful but not just prior to bed time.       Relevant Orders   Ambulatory referral to Psychiatry    Other Visit Diagnoses    High risk medication use    -  Primary      I have discontinued Barnetta Chapel A. Olheiser's triamcinolone cream, acetaminophen, and cephALEXin. I am also having her maintain her aspirin, Vitamin D3, Fish Oil, albuterol, beclomethasone, Spacer/Aero  Chamber Mouthpiece, traZODone, nystatin cream, metoprolol succinate, ranitidine, lansoprazole, temazepam, and LORazepam.  Meds ordered this encounter  Medications  . temazepam (RESTORIL) 30 MG capsule    Sig: TAKE 1 CAPSULE BY MOUTH ONCE AT BEDTIME    Dispense:  30 capsule    Refill:  2  . LORazepam (ATIVAN) 1 MG tablet    Sig: TAKE 1/2 TO 1 TABLET BY MOUTH EVERY 8 HOURS AS NEEDED FOR ANXIETY    Dispense:  70 tablet    Refill:  1    CMA served as scribe during this visit. History, Physical and Plan performed by medical provider. Documentation and orders reviewed and attested to.  Penni Homans, MD

## 2018-04-09 NOTE — Assessment & Plan Note (Signed)
hgba1c acceptable, minimize simple carbs. Increase exercise as tolerated.  

## 2018-04-09 NOTE — Patient Instructions (Signed)

## 2018-04-10 ENCOUNTER — Encounter: Payer: Self-pay | Admitting: Family Medicine

## 2018-04-10 MED FILL — LORazepam 1 MG TABS: 1 | 23 days supply | Qty: 70 | Fill #0

## 2018-04-12 NOTE — Assessment & Plan Note (Signed)
Mild, no changes at this time

## 2018-04-12 NOTE — Assessment & Plan Note (Signed)
Encouraged heart healthy diet, increase exercise, avoid trans fats, consider a krill oil cap daily 

## 2018-04-12 NOTE — Assessment & Plan Note (Signed)
Encouraged good sleep hygiene such as dark, quiet room. No blue/green glowing lights such as computer screens in bedroom. No alcohol or stimulants in evening. Cut down on caffeine as able. Regular exercise is helpful but not just prior to bed time.  

## 2018-04-12 NOTE — Assessment & Plan Note (Signed)
She continues to struggle with stress and is tearful she agrees to referral to psychiatry to consider medication changes.

## 2018-04-25 MED FILL — TEMAZEPAM 30 MG CAPSULE: 30 | 30 days supply | Qty: 30 | Fill #0

## 2018-05-13 ENCOUNTER — Other Ambulatory Visit: Payer: Self-pay | Admitting: Family Medicine

## 2018-05-13 MED FILL — LANSOPRAZOLE DR 30 MG CAP: 30 | 30 days supply | Qty: 30 | Fill #2

## 2018-05-13 MED FILL — traZODone HCL 150 MG TABS: 150 | 90 days supply | Qty: 90 | Fill #0

## 2018-05-13 MED FILL — LORazepam 1 MG TABS: 1 | 23 days supply | Qty: 70 | Fill #1

## 2018-05-28 MED FILL — TEMAZEPAM 30 MG CAPSULE: 30 | 30 days supply | Qty: 30 | Fill #1

## 2018-05-29 DIAGNOSIS — H25013 Cortical age-related cataract, bilateral: Secondary | ICD-10-CM | POA: Diagnosis not present

## 2018-05-29 DIAGNOSIS — H2513 Age-related nuclear cataract, bilateral: Secondary | ICD-10-CM | POA: Diagnosis not present

## 2018-05-29 DIAGNOSIS — H35033 Hypertensive retinopathy, bilateral: Secondary | ICD-10-CM | POA: Diagnosis not present

## 2018-05-29 DIAGNOSIS — H35041 Retinal micro-aneurysms, unspecified, right eye: Secondary | ICD-10-CM | POA: Diagnosis not present

## 2018-05-29 DIAGNOSIS — H40013 Open angle with borderline findings, low risk, bilateral: Secondary | ICD-10-CM | POA: Diagnosis not present

## 2018-05-30 ENCOUNTER — Ambulatory Visit (INDEPENDENT_AMBULATORY_CARE_PROVIDER_SITE_OTHER): Payer: Self-pay | Admitting: Family Medicine

## 2018-05-30 VITALS — BP 130/80 | HR 84 | Temp 99.4°F | Resp 16 | Wt 203.8 lb

## 2018-05-30 DIAGNOSIS — Z0000 Encounter for general adult medical examination without abnormal findings: Secondary | ICD-10-CM

## 2018-05-30 NOTE — Patient Instructions (Signed)

## 2018-05-30 NOTE — Progress Notes (Signed)
[name] Jensen is a 62 y.o. female who presents today with concerns of need for an annual physical exam. She is under the care of a PCP and denies any acute conditions that need to be addressed today.  Review of Systems  Constitutional: Negative for chills, fever and malaise/fatigue.  HENT: Negative for congestion, ear discharge, ear pain, sinus pain and sore throat.   Eyes: Negative.   Respiratory: Negative for cough, sputum production and shortness of breath.   Cardiovascular: Negative.  Negative for chest pain.  Gastrointestinal: Negative for abdominal pain, diarrhea, nausea and vomiting.  Genitourinary: Negative for dysuria, frequency, hematuria and urgency.  Musculoskeletal: Negative for myalgias.  Skin: Negative.   Neurological: Negative for headaches.  Endo/Heme/Allergies: Negative.   Psychiatric/Behavioral: Negative.     O: Vitals:   05/30/18 1927  BP: 130/80  Pulse: 84  Resp: 16  Temp: 99.4 F (37.4 C)  SpO2: 94%     Physical Exam  Constitutional: She is oriented to person, place, and time. Vital signs are normal. She appears well-developed and well-nourished. She is active.  Non-toxic appearance. She does not have a sickly appearance.  HENT:  Head: Normocephalic.  Right Ear: Hearing, tympanic membrane, external ear and ear canal normal.  Left Ear: Hearing, tympanic membrane, external ear and ear canal normal.  Nose: Nose normal.  Mouth/Throat: Uvula is midline and oropharynx is clear and moist.  Neck: Normal range of motion. Neck supple.  Cardiovascular: Normal rate, regular rhythm, normal heart sounds and normal pulses.  Pulmonary/Chest: Effort normal and breath sounds normal.  Abdominal: Soft. Bowel sounds are normal.  Musculoskeletal: Normal range of motion.  Lymphadenopathy:       Head (right side): No submental and no submandibular adenopathy present.       Head (left side): No submental and no submandibular adenopathy present.    She has no cervical  adenopathy.  Neurological: She is alert and oriented to person, place, and time.  Psychiatric: She has a normal mood and affect. Her speech is normal and behavior is normal. Cognition and memory are normal.  PHQ-9- negative  Vitals reviewed.  A: 1. Physical exam    P: Exam findings, diagnosis etiology and medication use and indications reviewed with patient. Follow- Up and discharge instructions provided. No emergent/urgent issues found on exam.  Patient verbalized understanding of information provided and agrees with plan of care (POC), all questions answered.  1. Physical exam WNL

## 2018-06-11 ENCOUNTER — Ambulatory Visit: Payer: 59 | Admitting: Family Medicine

## 2018-06-20 ENCOUNTER — Telehealth: Payer: Self-pay

## 2018-06-20 ENCOUNTER — Encounter: Payer: Self-pay | Admitting: Family Medicine

## 2018-06-20 ENCOUNTER — Ambulatory Visit: Payer: 59 | Admitting: Family Medicine

## 2018-06-20 VITALS — BP 138/78 | HR 77 | Temp 98.4°F | Resp 18 | Wt 200.0 lb

## 2018-06-20 DIAGNOSIS — F322 Major depressive disorder, single episode, severe without psychotic features: Secondary | ICD-10-CM | POA: Diagnosis not present

## 2018-06-20 DIAGNOSIS — F418 Other specified anxiety disorders: Secondary | ICD-10-CM | POA: Diagnosis not present

## 2018-06-20 DIAGNOSIS — E6609 Other obesity due to excess calories: Secondary | ICD-10-CM

## 2018-06-20 DIAGNOSIS — E559 Vitamin D deficiency, unspecified: Secondary | ICD-10-CM | POA: Diagnosis not present

## 2018-06-20 DIAGNOSIS — R109 Unspecified abdominal pain: Secondary | ICD-10-CM

## 2018-06-20 DIAGNOSIS — R197 Diarrhea, unspecified: Secondary | ICD-10-CM | POA: Diagnosis not present

## 2018-06-20 DIAGNOSIS — E782 Mixed hyperlipidemia: Secondary | ICD-10-CM

## 2018-06-20 DIAGNOSIS — K219 Gastro-esophageal reflux disease without esophagitis: Secondary | ICD-10-CM

## 2018-06-20 DIAGNOSIS — R739 Hyperglycemia, unspecified: Secondary | ICD-10-CM | POA: Diagnosis not present

## 2018-06-20 DIAGNOSIS — R000 Tachycardia, unspecified: Secondary | ICD-10-CM | POA: Diagnosis not present

## 2018-06-20 LAB — COMPREHENSIVE METABOLIC PANEL
ALK PHOS: 90 U/L (ref 39–117)
ALT: 25 U/L (ref 0–35)
AST: 18 U/L (ref 0–37)
Albumin: 4.5 g/dL (ref 3.5–5.2)
BILIRUBIN TOTAL: 0.3 mg/dL (ref 0.2–1.2)
BUN: 12 mg/dL (ref 6–23)
CO2: 27 meq/L (ref 19–32)
Calcium: 9.5 mg/dL (ref 8.4–10.5)
Chloride: 103 mEq/L (ref 96–112)
Creatinine, Ser: 0.77 mg/dL (ref 0.40–1.20)
GFR: 81.3 mL/min (ref >60.00)
Glucose, Bld: 129 mg/dL — ABNORMAL HIGH (ref 70–99)
POTASSIUM: 3.8 meq/L (ref 3.5–5.1)
SODIUM: 139 meq/L (ref 135–145)
TOTAL PROTEIN: 7.2 g/dL (ref 6.0–8.3)

## 2018-06-20 LAB — LIPID PANEL
Cholesterol: 198 mg/dL (ref 0–200)
HDL: 46.4 mg/dL (ref >39.00)
NONHDL: 151.61
Total CHOL/HDL Ratio: 4
Triglycerides: 229 mg/dL — ABNORMAL HIGH (ref 0.0–149.0)
VLDL: 45.8 mg/dL — AB (ref 0.0–40.0)

## 2018-06-20 LAB — HEMOGLOBIN A1C: HEMOGLOBIN A1C: 6.3 % (ref 4.6–6.5)

## 2018-06-20 LAB — CBC
HCT: 39.5 % (ref 36.0–46.0)
Hemoglobin: 13.6 g/dL (ref 12.0–15.0)
MCHC: 34.4 g/dL (ref 30.0–36.0)
MCV: 83 fl (ref 78.0–100.0)
Platelets: 295 10*3/uL (ref 150.0–400.0)
RBC: 4.75 Mil/uL (ref 3.87–5.11)
RDW: 13.6 % (ref 11.5–15.5)
WBC: 6.9 10*3/uL (ref 4.0–10.5)

## 2018-06-20 LAB — TSH: TSH: 1.53 u[IU]/mL (ref 0.35–4.50)

## 2018-06-20 LAB — LIPASE: LIPASE: 26 U/L (ref 11.0–59.0)

## 2018-06-20 LAB — LDL CHOLESTEROL, DIRECT: Direct LDL: 101 mg/dL

## 2018-06-20 MED ORDER — LORAZEPAM 1 MG PO TABS: ORAL_TABLET | ORAL | 3 refills | Status: DC

## 2018-06-20 MED ORDER — VORTIOXETINE HBR 10 MG PO TABS: 10.0000 mg | ORAL_TABLET | Freq: Every day | ORAL | 2 refills | Status: DC

## 2018-06-20 MED ORDER — TEMAZEPAM 30 MG PO CAPS: ORAL_CAPSULE | ORAL | 3 refills | Status: DC

## 2018-06-20 MED ORDER — RANITIDINE HCL 300 MG PO TABS: ORAL_TABLET | ORAL | 3 refills | Status: DC

## 2018-06-20 MED FILL — LORazepam 1 MG TABS: 1 | 23 days supply | Qty: 70 | Fill #0

## 2018-06-20 MED FILL — raNITIdine HCL 300 MG TABS: 300 | 90 days supply | Qty: 90 | Fill #0

## 2018-06-20 NOTE — Telephone Encounter (Signed)
PA initiated via Covermymeds; KEY: [id number]. Awaiting determination.

## 2018-06-20 NOTE — Assessment & Plan Note (Addendum)
Having episodes every meal over past few months. No bloody stool but will proceed with stool testing which is negative except fo wbc in stool. Try diflucan and if no improvement then may try a course of Welchol.

## 2018-06-20 NOTE — Patient Instructions (Addendum)
Hydrate packets for electrolytes Food Choices to Help Relieve Diarrhea, Adult When you have diarrhea, the foods you eat and your eating habits are very important. Choosing the right foods and drinks can help:  Relieve diarrhea.  Replace lost fluids and nutrients.  Prevent dehydration.  What general guidelines should I follow? Relieving diarrhea  Choose foods with less than 2 g or .07 oz. of fiber per serving.  Limit fats to less than 8 tsp (38 g or 1.34 oz.) a day.  Avoid the following: ? Foods and beverages sweetened with high-fructose corn syrup, honey, or sugar alcohols such as xylitol, sorbitol, and mannitol. ? Foods that contain a lot of fat or sugar. ? Fried, greasy, or spicy foods. ? High-fiber grains, breads, and cereals. ? Raw fruits and vegetables.  Eat foods that are rich in probiotics. These foods include dairy products such as yogurt and fermented milk products. They help increase healthy bacteria in the stomach and intestines (gastrointestinal tract, or GI tract).  If you have lactose intolerance, avoid dairy products. These may make your diarrhea worse.  Take medicine to help stop diarrhea (antidiarrheal medicine) only as told by your health care provider. Replacing nutrients  Eat small meals or snacks every 3-4 hours.  Eat bland foods, such as white rice, toast, or baked potato, until your diarrhea starts to get better. Gradually reintroduce nutrient-rich foods as tolerated or as told by your health care provider. This includes: ? Well-cooked protein foods. ? Peeled, seeded, and soft-cooked fruits and vegetables. ? Low-fat dairy products.  Take vitamin and mineral supplements as told by your health care provider. Preventing dehydration   Start by sipping water or a special solution to prevent dehydration (oral rehydration solution, ORS). Urine that is clear or pale yellow means that you are getting enough fluid.  Try to drink at least 8-10 cups of fluid  each day to help replace lost fluids.  You may add other liquids in addition to water, such as clear juice or decaffeinated sports drinks, as tolerated or as told by your health care provider.  Avoid drinks with caffeine, such as coffee, tea, or soft drinks.  Avoid alcohol. What foods are recommended? The items listed may not be a complete list. Talk with your health care provider about what dietary choices are best for you. Grains White rice. White, [place], or pita breads (fresh or toasted), including plain rolls, buns, or bagels. White pasta. Saltine, soda, or graham crackers. Pretzels. Low-fiber cereal. Cooked cereals made with water (such as cornmeal, farina, or cream cereals). Plain muffins. Matzo. Melba toast. Zwieback. Vegetables Potatoes (without the skin). Most well-cooked and canned vegetables without skins or seeds. Tender lettuce. Fruits Apple sauce. Fruits canned in juice. Cooked apricots, cherries, grapefruit, peaches, pears, or plums. Fresh bananas and cantaloupe. Meats and other protein foods Baked or boiled chicken. Eggs. Tofu. Fish. Seafood. Smooth nut butters. Ground or well-cooked tender beef, ham, veal, lamb, pork, or poultry. Dairy Plain yogurt, kefir, and unsweetened liquid yogurt. Lactose-free milk, buttermilk, skim milk, or soy milk. Low-fat or nonfat hard cheese. Beverages Water. Low-calorie sports drinks. Fruit juices without pulp. Strained tomato and vegetable juices. Decaffeinated teas. Sugar-free beverages not sweetened with sugar alcohols. Oral rehydration solutions, if approved by your health care provider. Seasoning and other foods Bouillon, broth, or soups made from recommended foods. What foods are not recommended? The items listed may not be a complete list. Talk with your health care provider about what dietary choices are best for you. Grains  Whole grain, whole wheat, bran, or rye breads, rolls, pastas, and crackers. Wild or brown rice. Whole grain or  bran cereals. Barley. Oats and oatmeal. Corn tortillas or taco shells. Granola. Popcorn. Vegetables Raw vegetables. Fried vegetables. Cabbage, broccoli, Brussels sprouts, artichokes, baked beans, beet greens, corn, kale, legumes, peas, sweet potatoes, and yams. Potato skins. Cooked spinach and cabbage. Fruits Dried fruit, including raisins and dates. Raw fruits. Stewed or dried prunes. Canned fruits with syrup. Meat and other protein foods Fried or fatty meats. Deli meats. Chunky nut butters. Nuts and seeds. Beans and lentils. Berniece Salines. Hot dogs. Sausage. Dairy High-fat cheeses. Whole milk, chocolate milk, and beverages made with milk, such as milk shakes. Half-and-half. Cream. sour cream. Ice cream. Beverages Caffeinated beverages (such as coffee, tea, soda, or energy drinks). Alcoholic beverages. Fruit juices with pulp. Prune juice. Soft drinks sweetened with high-fructose corn syrup or sugar alcohols. High-calorie sports drinks. Fats and oils Butter. Cream sauces. Margarine. Salad oils. Plain salad dressings. Olives. Avocados. Mayonnaise. Sweets and desserts Sweet rolls, doughnuts, and sweet breads. Sugar-free desserts sweetened with sugar alcohols such as xylitol and sorbitol. Seasoning and other foods Honey. Hot sauce. Chili powder. Gravy. Cream-based or milk-based soups. Pancakes and waffles. Summary  When you have diarrhea, the foods you eat and your eating habits are very important.  Make sure you get at least 8-10 cups of fluid each day, or enough to keep your urine clear or pale yellow.  Eat bland foods and gradually reintroduce healthy, nutrient-rich foods as tolerated, or as told by your health care provider.  Avoid high-fiber, fried, greasy, or spicy foods. This information is not intended to replace advice given to you by your health care provider. Make sure you discuss any questions you have with your health care provider. Document Released: 02/10/2004 Document Revised:  11/17/2016 Document Reviewed: 11/17/2016 Elsevier Interactive Patient Education  [employer].

## 2018-06-20 NOTE — Assessment & Plan Note (Signed)
hgba1c acceptable, minimize simple carbs. Increase exercise as tolerated. Continue current meds 

## 2018-06-20 NOTE — Assessment & Plan Note (Signed)
RRR today 

## 2018-06-20 NOTE — Assessment & Plan Note (Signed)
Encouraged heart healthy diet, increase exercise, avoid trans fats, consider a krill oil cap daily 

## 2018-06-20 NOTE — Assessment & Plan Note (Signed)
Take Vitamin D 2000 IU daily.

## 2018-06-21 LAB — FOOD ALLERGY PROFILE
Almonds: <0.1 kU/L
CLASS: 0
CLASS: 0
CLASS: 0
CLASS: 0
CLASS: 0
CLASS: 0
CLASS: 0
CLASS: 0
CLASS: 0
CLASS: 0
CLASS: 0
CLASS: 0
CLASS: 0
Cashew IgE: <0.1 kU/L
Class: 0
Class: 0
Fish Cod: <0.1 kU/L
HAZELNUT: 0.27 kU/L — AB
Peanut IgE: <0.1 kU/L
Shrimp IgE: <0.1 kU/L
Soybean IgE: <0.1 kU/L
Wheat IgE: <0.1 kU/L

## 2018-06-21 LAB — INTERPRETATION:

## 2018-06-21 NOTE — Telephone Encounter (Signed)
Pt called in and stated hyoscyamine was suppose to be prescribed to her for stomach spasms but the pharmacy still doesn't have it

## 2018-06-21 NOTE — Telephone Encounter (Signed)
Copied from [place] [phone]. Topic: Quick Communication - See Telephone Encounter >> Jun 20, 2018 11:19 AM Antonieta Iba C wrote: CRM for notification. See Telephone encounter for: 06/20/18.   Pt says in todays ov she was advised by provider that she is going to send in a Rx for anti-spasm for pt's stomach, pt says that she is not showing that it was sent in, please assist   Pharmacy: [place], [place] - [address] [facility].   Please advise on medication

## 2018-06-23 ENCOUNTER — Other Ambulatory Visit: Payer: Self-pay | Admitting: Family Medicine

## 2018-06-23 MED ORDER — FLUCONAZOLE 150 MG PO TABS: ORAL_TABLET | ORAL | 0 refills | Status: DC

## 2018-06-23 MED ORDER — HYOSCYAMINE SULFATE 0.125 MG SL SUBL: 0.1250 mg | SUBLINGUAL_TABLET | SUBLINGUAL | 1 refills | Status: DC | PRN

## 2018-06-23 NOTE — Progress Notes (Signed)
Subjective:    Patient ID: [name] Jensen, female    DOB: 1956-07-07, 62 y.o.   MRN: [id number]  No chief complaint on file.   HPI Patient is in today for follow-up and she continues to struggle with severe depression.  She has been having difficulty finding a female psychiatrist this in her network.  She acknowledges significant anhedonia and is struggling to motivate even to get to work although she continues to do so.  She does not endorse suicidal or homicidal ideations that is active but does acknowledge she has had fleeting suicidal thoughts in the past.  No recent hospitalization or febrile illness.  She continues to struggle with diarrhea and has watery stool after each meal and multiple times each day.  No bloody or tarry stool.  Has abdominal cramping at times.  No fevers or chills. Denies CP/palp/SOB/HA/congestion/fevers or GU c/o. Taking meds as prescribed  Past Medical History:  Diagnosis Date  . Adjustment reaction with anxiety and depression 06/12/2014  . Anemia    h/o low ferritin  . Anxiety   . Asthma    environmental triggers  . Chicken pox as a child  . Cough 08/22/2015  . Depression   . Depression with anxiety 06/12/2014  . Diverticulosis   . Ehrlichiosis 6712  . Gallstones   . GERD (gastroesophageal reflux disease)   . Hair loss 03/28/2017  . Hiatal hernia 2007  . History of viral illness   . Hyperlipidemia, mixed 04/11/2015  . Insomnia 08/21/2017  . Lactose intolerance 06/12/2014  . Mumps as a child  . Overweight 08/19/2014  . Rectocele 06/12/2014  . RLS (restless legs syndrome)   . 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 Sessile colonic polyp   . SVT (supraventricular tachycardia) ([place])   . Tachycardia 06/12/2014  . Thyroid disease   . Vitamin D deficiency 08/22/2015    Past Surgical History:  Procedure Laterality Date  . ABDOMINAL HYSTERECTOMY  11-2008   total with cystocele, rectocele repair  . APPENDECTOMY    .  CHOLECYSTECTOMY    . COLONOSCOPY  2006   diverticulosis, hemorrhoids ([place])  . ESOPHAGOGASTRODUODENOSCOPY  2006   small hiatus hernia ([place])  . ESOPHAGOGASTRODUODENOSCOPY N/A 02/21/2018   Procedure: ESOPHAGOGASTRODUODENOSCOPY (EGD);  Surgeon: Rogene Houston, MD;  Location: [facility];  Service: Endoscopy;  Laterality: N/A;  pt knows to arrive at 2:45  . INCONTINENCE SURGERY    . SKIN SURGERY     right arm scc  . svt ablation    . TONSILLECTOMY  1972   2ith adenoid  . WISDOM TOOTH EXTRACTION  62 yrs old    Family History  Problem Relation Age of Onset  . Hypertension Mother   . Hypertension Father   . Hypertension Sister   . Diabetes Sister        type 2  . Fibromyalgia Sister   . GI Bleed Maternal Grandmother   . Atrial fibrillation Maternal Grandmother   . Heart disease Maternal Grandfather   . Diabetes Paternal Grandmother   . Stroke Paternal Grandfather   . Proteinuria Sister     Social History   Socioeconomic History  . Marital status: Divorced    Spouse name: Not on file  . Number of children: 2  . Years of education: Not on file  . Highest education level: Not on file  Occupational History  . Occupation: Programmer, multimedia: [employer]  .  Financial resource strain: Not on file  . Food insecurity:    Worry: Not on file    Inability: Not on file  . Transportation needs:    Medical: Not on file    Non-medical: Not on file  Tobacco Use  . Smoking status: Never Smoker  . Smokeless tobacco: Never Used  Substance and Sexual Activity  . Alcohol use: No    Alcohol/week: 0.0 oz  . Drug use: No  . Sexual activity: Not on file    Comment: lives by self with 4 dogs works at [employer], avoids dairy  Lifestyle  . Physical activity:    Days per week: Not on file    Minutes per session: Not on file  . Stress: Not on file  Relationships  . Social connections:    Talks on phone: Not on file    Gets together: Not on file    Attends religious  service: Not on file    Active member of club or organization: Not on file    Attends meetings of clubs or organizations: Not on file    Relationship status: Not on file  . Intimate partner violence:    Fear of current or ex partner: Not on file    Emotionally abused: Not on file    Physically abused: Not on file    Forced sexual activity: Not on file  Other Topics Concern  . Not on file  Social History Narrative  . Not on file    Outpatient Medications Prior to Visit  Medication Sig Dispense Refill  . albuterol (PROVENTIL HFA;VENTOLIN HFA) 108 (90 Base) MCG/ACT inhaler Inhale 2 puffs into the lungs every 6 (six) hours as needed for wheezing. 1 Inhaler 6  . aspirin (ASPIRIN EC) 81 MG EC tablet Take 81 mg by mouth at bedtime.     . beclomethasone (QVAR) 80 MCG/ACT inhaler Inhale 1 puff into the lungs daily. (Patient taking differently: Inhale 1 puff into the lungs daily as needed (for shortness of breath or wheezing). ) 1 Inhaler 6  . Cholecalciferol (VITAMIN D3) 5000 units CAPS Take 5,000 Units by mouth daily.     . lansoprazole (PREVACID) 30 MG capsule Take 1 capsule (30 mg total) by mouth daily before breakfast. 30 capsule 5  . metoprolol succinate (TOPROL XL) 100 MG 24 hr tablet Take 1 tablet (100 mg total) by mouth 2 (two) times daily. Take with or immediately following a meal. 180 tablet 3  . nystatin cream (MYCOSTATIN) Apply 1 application topically 2 (two) times daily. 30 g 2  . Omega-3 Fatty Acids (FISH OIL) 1000 MG CAPS Take 2,000 mg by mouth 3 (three) times a week.     Marland Kitchen Spacer/Aero Chamber Mouthpiece MISC Use with Albuterol HFA as directed 1 each 1  . traZODone (DESYREL) 150 MG tablet TAKE 1 TABLET (150 MG TOTAL) BY MOUTH AT BEDTIME. 90 tablet 1  . LORazepam (ATIVAN) 1 MG tablet TAKE 1/2 TO 1 TABLET BY MOUTH EVERY 8 HOURS AS NEEDED FOR ANXIETY 70 tablet 1  . ranitidine (ZANTAC) 300 MG tablet TAKE 1 TABLET BY MOUTH AT BEDTIME AS NEEDED FOR HEARTBURN 30 tablet 0  . temazepam  (RESTORIL) 30 MG capsule TAKE 1 CAPSULE BY MOUTH ONCE AT BEDTIME 30 capsule 2  . traZODone (DESYREL) 150 MG tablet Take 0.5 tablets (75 mg total) by mouth at bedtime. 90 tablet 1   No facility-administered medications prior to visit.     Allergies  Allergen Reactions  . Dilaudid [  Hydromorphone Hcl] Other (See Comments)    Respiratory , cardiac     Review of Systems  Constitutional: Positive for malaise/fatigue. Negative for fever.  HENT: Negative for congestion.   Eyes: Negative for blurred vision.  Respiratory: Negative for shortness of breath.   Cardiovascular: Negative for chest pain, palpitations and leg swelling.  Gastrointestinal: Positive for abdominal pain, diarrhea and melena. Negative for blood in stool and nausea.  Genitourinary: Negative for dysuria and frequency.  Musculoskeletal: Negative for falls.  Skin: Negative for rash.  Neurological: Negative for dizziness, loss of consciousness and headaches.  Endo/Heme/Allergies: Negative for environmental allergies.  Psychiatric/Behavioral: Positive for depression. The patient is nervous/anxious.        Objective:    Physical Exam  Constitutional: She is oriented to person, place, and time. She appears well-developed and well-nourished. No distress.  HENT:  Head: Normocephalic and atraumatic.  Nose: Nose normal.  Eyes: Right eye exhibits no discharge. Left eye exhibits no discharge.  Neck: Normal range of motion. Neck supple.  Cardiovascular: Normal rate and regular rhythm.  No murmur heard. Pulmonary/Chest: Effort normal and breath sounds normal.  Abdominal: Soft. Bowel sounds are normal. There is no tenderness.  Musculoskeletal: She exhibits no edema.  Neurological: She is alert and oriented to person, place, and time.  Skin: Skin is warm and dry.  Psychiatric: She has a normal mood and affect.  Nursing note and vitals reviewed.   BP 138/78 (BP Location: Left Arm, Patient Position: Sitting, Cuff Size: Normal)    Pulse 77   Temp 98.4 F (36.9 C) (Oral)   Resp 18   Wt 200 lb (90.7 kg)   SpO2 97%   BMI 34.33 kg/m  Wt Readings from Last 3 Encounters:  06/20/18 200 lb (90.7 kg)  05/30/18 203 lb 12.8 oz (92.4 kg)  04/09/18 202 lb 6.4 oz (91.8 kg)     Lab Results  Component Value Date   WBC 6.9 06/20/2018   HGB 13.6 06/20/2018   HCT 39.5 06/20/2018   PLT 295.0 06/20/2018   GLUCOSE 129 (H) 06/20/2018   CHOL 198 06/20/2018   TRIG 229.0 (H) 06/20/2018   HDL 46.40 06/20/2018   LDLDIRECT 101.0 06/20/2018   LDLCALC 112 (H) 02/06/2018   ALT 25 06/20/2018   AST 18 06/20/2018   NA 139 06/20/2018   K 3.8 06/20/2018   CL 103 06/20/2018   CREATININE 0.77 06/20/2018   BUN 12 06/20/2018   CO2 27 06/20/2018   TSH 1.53 06/20/2018   INR 1.0 11/09/2008   HGBA1C 6.3 06/20/2018    Lab Results  Component Value Date   TSH 1.53 06/20/2018   Lab Results  Component Value Date   WBC 6.9 06/20/2018   HGB 13.6 06/20/2018   HCT 39.5 06/20/2018   MCV 83.0 06/20/2018   PLT 295.0 06/20/2018   Lab Results  Component Value Date   NA 139 06/20/2018   K 3.8 06/20/2018   CO2 27 06/20/2018   GLUCOSE 129 (H) 06/20/2018   BUN 12 06/20/2018   CREATININE 0.77 06/20/2018   BILITOT 0.3 06/20/2018   ALKPHOS 90 06/20/2018   AST 18 06/20/2018   ALT 25 06/20/2018   PROT 7.2 06/20/2018   ALBUMIN 4.5 06/20/2018   CALCIUM 9.5 06/20/2018   ANIONGAP 12 01/24/2018   GFR 81.30 06/20/2018   Lab Results  Component Value Date   CHOL 198 06/20/2018   Lab Results  Component Value Date   HDL 46.40 06/20/2018   Lab Results  Component Value Date   LDLCALC 112 (H) 02/06/2018   Lab Results  Component Value Date   TRIG 229.0 (H) 06/20/2018   Lab Results  Component Value Date   CHOLHDL 4 06/20/2018   Lab Results  Component Value Date   HGBA1C 6.3 06/20/2018       Assessment & Plan:   Problem List Items Addressed This Visit    Tachycardia    RRR today      Relevant Orders   CBC (Completed)    TSH (Completed)   GERD (gastroesophageal reflux disease)    Avoid offending foods, start probiotics. Do not eat large meals in late evening and consider raising head of bed.       Relevant Medications   ranitidine (ZANTAC) 300 MG tablet   Obesity    Encouraged DASH diet, decrease po intake and increase exercise as tolerated. Needs 7-8 hours of sleep nightly. Avoid trans fats, eat small, frequent meals every 4-5 hours with lean proteins, complex carbs and healthy fats. Minimize simple carbs,      Diarrhea - Primary    Having episodes every meal over past few months. No bloody stool but will proceed with stool testing which is negative except fo wbc in stool. Try diflucan and if no improvement then may try a course of Welchol.       Relevant Orders   Food Allergy Profile (Completed)   Stool Culture (Completed)   Stool, WBC/Lactoferrin (Completed)   Clostridium Difficile by PCR([facility])   Ova and parasite examination   Abdominal pain    Given a trial of Hyoscyamine to try.       Relevant Orders   Food Allergy Profile (Completed)   Stool Culture (Completed)   Stool, WBC/Lactoferrin (Completed)   Clostridium Difficile by PCR([facility])   Ova and parasite examination   Hyperlipidemia, mixed    Encouraged heart healthy diet, increase exercise, avoid trans fats, consider a krill oil cap daily      Relevant Orders   Lipid panel (Completed)   Hyperglycemia    hgba1c acceptable, minimize simple carbs. Increase exercise as tolerated. Continue current meds      Relevant Orders   Hemoglobin A1c (Completed)   Comprehensive metabolic panel (Completed)   Lipase (Completed)   Vitamin D deficiency    Take Vitamin D 2000 IU daily.        Other Visit Diagnoses    Severe depression ([place])       Relevant Medications   LORazepam (ATIVAN) 1 MG tablet   vortioxetine HBr (TRINTELLIX) 10 MG TABS tablet   Other Relevant Orders   Ambulatory referral to Psychiatry      I  am having Barnetta Chapel A. Botkin start on vortioxetine HBr. I am also having her maintain her aspirin, Vitamin D3, Fish Oil, albuterol, beclomethasone, Spacer/Aero Chamber Mouthpiece, nystatin cream, metoprolol succinate, lansoprazole, traZODone, LORazepam, temazepam, and ranitidine.  Meds ordered this encounter  Medications  . LORazepam (ATIVAN) 1 MG tablet    Sig: TAKE 1/2 TO 1 TABLET BY MOUTH EVERY 8 HOURS AS NEEDED FOR ANXIETY    Dispense:  70 tablet    Refill:  3  . temazepam (RESTORIL) 30 MG capsule    Sig: TAKE 1 CAPSULE BY MOUTH ONCE AT BEDTIME    Dispense:  30 capsule    Refill:  3  . ranitidine (ZANTAC) 300 MG tablet    Sig: TAKE 1 TABLET BY MOUTH AT BEDTIME AS NEEDED FOR HEARTBURN    Dispense:  90  tablet    Refill:  3  . vortioxetine HBr (TRINTELLIX) 10 MG TABS tablet    Sig: Take 1 tablet (10 mg total) by mouth daily.    Dispense:  30 tablet    Refill:  2     Penni Homans, MD

## 2018-06-23 NOTE — Assessment & Plan Note (Signed)
Given a trial of Hyoscyamine to try.

## 2018-06-23 NOTE — Telephone Encounter (Signed)
I have sent it to [facility]

## 2018-06-23 NOTE — Assessment & Plan Note (Signed)
Avoid offending foods, start probiotics. Do not eat large meals in late evening and consider raising head of bed.  

## 2018-06-23 NOTE — Assessment & Plan Note (Signed)
Encouraged DASH diet, decrease po intake and increase exercise as tolerated. Needs 7-8 hours of sleep nightly. Avoid trans fats, eat small, frequent meals every 4-5 hours with lean proteins, complex carbs and healthy fats. Minimize simple carbs, 

## 2018-06-23 NOTE — Assessment & Plan Note (Signed)
Depression continues to be debilitating and she is havign trouble finding a psychaitrist in he network that is female. Have changed referral to [facility]. After discussion we will try to switch to [facility] while she is waiting fo psychiatry and she will call if she has any concerns.

## 2018-06-24 ENCOUNTER — Telehealth: Payer: Self-pay | Admitting: Family Medicine

## 2018-06-24 ENCOUNTER — Telehealth: Payer: Self-pay

## 2018-06-24 ENCOUNTER — Encounter: Payer: Self-pay | Admitting: Family Medicine

## 2018-06-24 LAB — FECAL LACTOFERRIN, QUANT
FECAL LACTOFERRIN: POSITIVE — AB
MICRO NUMBER:: 90853685
SPECIMEN QUALITY: ADEQUATE

## 2018-06-24 LAB — STOOL CULTURE
MICRO NUMBER: 90853683
MICRO NUMBER: 90853684
MICRO NUMBER:: 90853687
SHIGA RESULT: NOT DETECTED
SPECIMEN QUALITY: ADEQUATE
SPECIMEN QUALITY: ADEQUATE
SPECIMEN QUALITY:: ADEQUATE

## 2018-06-24 LAB — OVA AND PARASITE EXAMINATION
CONCENTRATE RESULT:: NONE SEEN
SPECIMEN QUALITY:: ADEQUATE
TRICHROME RESULT:: NONE SEEN
VKL: 90853686

## 2018-06-24 MED FILL — HYOSCYAMINE 0.125 MG TAB SL: 0.125 | 5 days supply | Qty: 30 | Fill #0

## 2018-06-24 MED FILL — FLUCONAZOLE 150 MG TABS: 150 | 40 days supply | Qty: 15 | Fill #0

## 2018-06-24 NOTE — Telephone Encounter (Unsigned)
Copied from [place] [phone]. Topic: Quick Communication - See Telephone Encounter >> Jun 24, 2018 11:03 AM Neva Seat wrote: Pt is wanting to know about the prescribed medication fluconazole. Please call pt back to let her know why it was prescribed.

## 2018-06-24 NOTE — Telephone Encounter (Signed)
PA has already been initiated on 06/20/2018. Awaiting determination.

## 2018-06-24 NOTE — Telephone Encounter (Signed)
PA approved.   The request has been approved. The authorization is effective for a maximum of 12 fills from 06/24/2018 to 06/24/2019, as long as the member is enrolled in their current health plan. The request was reviewed and approved by a licensed clinical pharmacist. A written notification letter will follow with additional details.

## 2018-06-24 NOTE — Telephone Encounter (Signed)
Copied from [place] [phone]. Topic: Quick Communication - See Telephone Encounter >> Jun 24, 2018 11:14 AM Ahmed Prima L wrote: CRM for notification. See Telephone encounter for: 06/24/18.  [place] called for a prior autho vortioxetine HBr (TRINTELLIX) 10 MG TABS tablet

## 2018-06-24 NOTE — Telephone Encounter (Signed)
Noted  

## 2018-06-24 NOTE — Telephone Encounter (Signed)
I sent a Mychart message explaining it was due to the WBC in her stool. Sometimes this is caused by yeast and we can help the diarrhea if so

## 2018-06-24 NOTE — Telephone Encounter (Signed)
Please advise 

## 2018-06-24 NOTE — Telephone Encounter (Signed)
Copied from [place] [phone]. Topic: Quick Communication - See Telephone Encounter >> Jun 20, 2018 11:19 AM Antonieta Iba C wrote: CRM for notification. See Telephone encounter for: 06/20/18.   Pt says in todays ov she was advised by provider that she is going to send in a Rx for anti-spasm for pt's stomach, pt says that she is not showing that it was sent in, please assist   Pharmacy: [place], [place] - [address] [facility].   Orders placed

## 2018-06-25 MED FILL — TRINTELLIX 10 MG TABLET: 10 | 30 days supply | Qty: 30 | Fill #0

## 2018-06-26 MED FILL — TEMAZEPAM 30 MG CAPSULE: 30 | 30 days supply | Qty: 30 | Fill #2

## 2018-07-17 ENCOUNTER — Encounter: Payer: Self-pay | Admitting: Registered"

## 2018-07-17 ENCOUNTER — Encounter: Payer: 59 | Attending: Family Medicine | Admitting: Registered"

## 2018-07-17 DIAGNOSIS — Z713 Dietary counseling and surveillance: Secondary | ICD-10-CM

## 2018-07-17 NOTE — Patient Instructions (Addendum)
-   Aim to eat well-balanced meals using handout.

## 2018-07-17 NOTE — Progress Notes (Signed)
  Medical Nutrition Therapy:  Appt start time: 2:45 end time:  3:40  Assessment:  Primary concerns today: Pt states she does not want to become type 2 diabetic.   Pt expectations: none stated  Pt states she wants something she can sustain and wants to know what diet she can do to help lose weight. Pt states she works 10 hour shifts, 7am-5pm with 15 min lunch breaks. Pt states she does not want to do water aerobics or get in pool because of size. Pt states she got disgusted with weight and stopped eating carbohydrates, limits to 20 grams/day. Pt states she is is petrified of having sugar. Recent lab values show A1C 6.3 (06/2018).   Past medical history: GERD, prediabetes  Next visit: meal planning   Preferred Learning Style:   No preference indicated   Learning Readiness:   Ready  Change in progress   MEDICATIONS: See list   DIETARY INTAKE:  Usual eating pattern includes 1 meals and 2-3 snacks per day.  Everyday foods include seafood, vegetables, fruit, and protein shakes.  Avoided foods include cereal and carbohydrates.    24-hr recall:  B ( AM): protein shake  Snk ( AM): cherries  L ( PM): grilled chicken +2 vegetables Snk ( PM): cherries D ( PM): 2 protein shakes Snk ( PM): string cheese or frozen cool whip Beverages: water, diet ginger ale,   Usual physical activity: horseback riding, yardwork  Estimated energy needs: 1600 calories 180 g carbohydrates 120 g protein 44 g fat  Progress Towards Goal(s):  In progress.   Nutritional Diagnosis:  NI-5.8.5 Inadeqate fiber intake As related to food and nutrition-related knowledge deficit .  As evidenced by dietary recall with minimal sources of intake.    Intervention:  Nutrition education and counseling. Pt was educated and counseled on prediabetes and the benefits of fiber. Pt was also educated on My Plate. Pt was om agreement with goals listed.  Goals: - Aim to eat well-balanced meals using handout.   Teaching  Method Utilized:  Visual Auditory Hands on  Handouts given during visit include:  My Plate  Barriers to learning/adherence to lifestyle change: work-life balance  Demonstrated degree of understanding via:  Teach Back   Monitoring/Evaluation:  Dietary intake, exercise, and body weight in 1 week(s).

## 2018-07-22 MED FILL — LORazepam 1 MG TABS: 1 | 23 days supply | Qty: 70 | Fill #1

## 2018-07-22 MED FILL — METOPROLOL SUCCINATE ER 100: 100 | 90 days supply | Qty: 180 | Fill #2

## 2018-07-22 MED FILL — TRINTELLIX 10 MG TABLET: 10 | 30 days supply | Qty: 30 | Fill #1

## 2018-07-24 ENCOUNTER — Encounter: Payer: 59 | Admitting: Registered"

## 2018-07-24 ENCOUNTER — Encounter: Payer: Self-pay | Admitting: Registered"

## 2018-07-24 DIAGNOSIS — Z713 Dietary counseling and surveillance: Secondary | ICD-10-CM

## 2018-07-24 NOTE — Patient Instructions (Signed)
-   Keep up the great work with changes!

## 2018-07-24 NOTE — Progress Notes (Signed)
  Medical Nutrition Therapy:  Appt start time: 3:20 end time:  3:40  Assessment:  Primary concerns today: Pt states she does not want to become type 2 diabetic.   Pt expectations: none stated  Pt states she has been eating more carbohydrates and eating more well-balanced meals. Pt states she has found a water exercise that she will be attending once a week for 2 weeks and really excited about it.   Pt states she wants something she can sustain and wants to know what diet she can do to help lose weight. Pt states she works 10 hour shifts, 7am-5pm with 15 min lunch breaks. Pt states she does not want to do water aerobics or get in pool because of size. Pt states she got disgusted with weight and stopped eating carbohydrates, limits to 20 grams/day. Pt states she is is petrified of having sugar. Recent lab values show A1C 6.3 (06/2018).   Past medical history: GERD, prediabetes  Next visit: meal planning   Preferred Learning Style:   No preference indicated   Learning Readiness:   Ready  Change in progress   MEDICATIONS: See list   DIETARY INTAKE:  Usual eating pattern includes 1 meals and 2-3 snacks per day.  Everyday foods include seafood, vegetables, fruit, and protein shakes.  Avoided foods include cereal and carbohydrates.    24-hr recall:  B ( AM): protein shake or egg Snk ( AM): cherries  L ( PM): grilled chicken +2 vegetables Snk ( PM): cherries or cashews D ( PM): pimento cheese, spinach, salad, crackers  Snk ( PM): string cheese or frozen cool whip Beverages: water, diet ginger ale   Usual physical activity: horseback riding, yardwork  Estimated energy needs: 1600 calories 180 g carbohydrates 120 g protein 44 g fat  Progress Towards Goal(s):  In progress.   Nutritional Diagnosis:  NI-5.8.5 Inadeqate fiber intake As related to food and nutrition-related knowledge deficit .  As evidenced by dietary recall with minimal sources of intake.    Intervention:   Nutrition education and counseling.  Goals: - Keep up the great work with changes!   Teaching Method Utilized:  Visual Auditory Hands on  Handouts given during visit include:  My Plate  Barriers to learning/adherence to lifestyle change: work-life balance  Demonstrated degree of understanding via:  Teach Back   Monitoring/Evaluation:  Dietary intake, exercise, and body weight in 1 week(s).

## 2018-07-25 MED FILL — TEMAZEPAM 30 MG CAPSULE: 30 | 30 days supply | Qty: 30 | Fill #0

## 2018-07-31 ENCOUNTER — Encounter: Payer: 59 | Admitting: Registered"

## 2018-07-31 ENCOUNTER — Encounter: Payer: Self-pay | Admitting: Registered"

## 2018-07-31 DIAGNOSIS — Z713 Dietary counseling and surveillance: Secondary | ICD-10-CM

## 2018-07-31 NOTE — Progress Notes (Signed)
  Medical Nutrition Therapy:  Appt start time: 3:10 end time: 3:35  Assessment:  Primary concerns today: Pt states she does not want to become type 2 diabetic.   Pt expectations: none stated  Pt states she water exrcises are held in outdoor pool and may take a break until they transition to indoor pool. Pt states she may use her treadmill at home to substitute for physical activity.   Pt states she works 10 hour shifts, 7am-5pm with 15 min lunch breaks. Pt states she is is petrified of having sugar. Recent lab values show A1C 6.3 (06/2018).   Past medical history: GERD, prediabetes   Preferred Learning Style:   No preference indicated   Learning Readiness:   Ready  Change in progress   MEDICATIONS: See list   DIETARY INTAKE:  Usual eating pattern includes 1 meals and 2-3 snacks per day.  Everyday foods include seafood, vegetables, fruit, and protein shakes.  Avoided foods include cereal and carbohydrates.    24-hr recall:  B ( AM): protein shake or egg Snk ( AM): cherries  L ( PM): grilled chicken +2 vegetables Snk ( PM): cherries or cashews D ( PM): pimento cheese, spinach, salad, crackers  Snk ( PM): string cheese or frozen cool whip Beverages: water, diet ginger ale   Usual physical activity: horseback riding, yardwork  Estimated energy needs: 1600 calories 180 g carbohydrates 120 g protein 44 g fat  Progress Towards Goal(s):  In progress.   Nutritional Diagnosis:  NI-5.8.5 Inadeqate fiber intake As related to food and nutrition-related knowledge deficit .  As evidenced by dietary recall with minimal sources of intake.    Intervention:  Nutrition education and counseling. Pt was educated and counseled on the benefits of focusing on daily habits versus weight. Pt was in agreement with goals stated.  Goals: - Continue to remember to focus more on doing what is best for your body rather than weight.  - Keep up the great work!  Teaching Method Utilized:   Visual Auditory Hands on  Handouts given during visit include:  none  Barriers to learning/adherence to lifestyle change: work-life balance  Demonstrated degree of understanding via:  Teach Back   Monitoring/Evaluation:  Dietary intake, exercise, and body weight prn.

## 2018-07-31 NOTE — Patient Instructions (Signed)
-   Continue to remember to focus more on doing what is best for your body rather than weight.   - Keep up the great work!

## 2018-08-26 MED FILL — TRINTELLIX 10 MG TABLET: 10 | 30 days supply | Qty: 30 | Fill #2

## 2018-08-26 MED FILL — LORazepam 1 MG TABS: 1 | 23 days supply | Qty: 70 | Fill #2

## 2018-08-26 MED FILL — TEMAZEPAM 30 MG CAPSULE: 30 | 30 days supply | Qty: 30 | Fill #1

## 2018-08-26 MED FILL — traZODone HCL 150 MG TABS: 150 | 90 days supply | Qty: 90 | Fill #1

## 2018-08-27 ENCOUNTER — Encounter: Payer: Self-pay | Admitting: Family Medicine

## 2018-08-27 ENCOUNTER — Ambulatory Visit: Payer: 59 | Admitting: Family Medicine

## 2018-08-27 DIAGNOSIS — E782 Mixed hyperlipidemia: Secondary | ICD-10-CM

## 2018-08-27 DIAGNOSIS — F418 Other specified anxiety disorders: Secondary | ICD-10-CM | POA: Diagnosis not present

## 2018-08-27 DIAGNOSIS — D649 Anemia, unspecified: Secondary | ICD-10-CM

## 2018-08-27 DIAGNOSIS — E6609 Other obesity due to excess calories: Secondary | ICD-10-CM

## 2018-08-27 DIAGNOSIS — K219 Gastro-esophageal reflux disease without esophagitis: Secondary | ICD-10-CM | POA: Diagnosis not present

## 2018-08-27 DIAGNOSIS — R739 Hyperglycemia, unspecified: Secondary | ICD-10-CM

## 2018-08-27 DIAGNOSIS — E559 Vitamin D deficiency, unspecified: Secondary | ICD-10-CM | POA: Diagnosis not present

## 2018-08-27 DIAGNOSIS — R197 Diarrhea, unspecified: Secondary | ICD-10-CM | POA: Diagnosis not present

## 2018-08-27 NOTE — Assessment & Plan Note (Addendum)
Is doing much better since her last visit. Avoid offending foods and encouraged fiber and probiotic supplements.

## 2018-08-27 NOTE — Patient Instructions (Signed)
[facility] Carbohydrate Counting for Diabetes Mellitus, Adult Carbohydrate counting is a method for keeping track of how many carbohydrates you eat. Eating carbohydrates naturally increases the amount of sugar (glucose) in the blood. Counting how many carbohydrates you eat helps keep your blood glucose within normal limits, which helps you manage your diabetes (diabetes mellitus). It is important to know how many carbohydrates you can safely have in each meal. This is different for every person. A diet and nutrition specialist (registered dietitian) can help you make a meal plan and calculate how many carbohydrates you should have at each meal and snack. Carbohydrates are found in the following foods:  Grains, such as breads and cereals.  Dried beans and soy products.  Starchy vegetables, such as potatoes, peas, and corn.  Fruit and fruit juices.  Milk and yogurt.  Sweets and snack foods, such as cake, cookies, candy, chips, and soft drinks.  How do I count carbohydrates? There are two ways to count carbohydrates in food. You can use either of the methods or a combination of both. Reading "Nutrition Facts" on packaged food The "Nutrition Facts" list is included on the labels of almost all packaged foods and beverages in the [place] It includes:  The serving size.  Information about nutrients in each serving, including the grams (g) of carbohydrate per serving.  To use the "Nutrition Facts":  Decide how many servings you will have.  Multiply the number of servings by the number of carbohydrates per serving.  The resulting number is the total amount of carbohydrates that you will be having.  Learning standard serving sizes of other foods When you eat foods containing carbohydrates that are not packaged or do not include "Nutrition Facts" on the label, you need to measure the servings in order to count the amount of carbohydrates:  Measure the foods that you will eat with a food  scale or measuring cup, if needed.  Decide how many standard-size servings you will eat.  Multiply the number of servings by 15. Most carbohydrate-rich foods have about 15 g of carbohydrates per serving. ? For example, if you eat 8 oz (170 g) of strawberries, you will have eaten 2 servings and 30 g of carbohydrates (2 servings x 15 g = 30 g).  For foods that have more than one food mixed, such as soups and casseroles, you must count the carbohydrates in each food that is included.  The following list contains standard serving sizes of common carbohydrate-rich foods. Each of these servings has about 15 g of carbohydrates:   hamburger bun or  English muffin.   oz (15 mL) syrup.   oz (14 g) jelly.  1 slice of bread.  1 six-inch tortilla.  3 oz (85 g) cooked rice or pasta.  4 oz (113 g) cooked dried beans.  4 oz (113 g) starchy vegetable, such as peas, corn, or potatoes.  4 oz (113 g) hot cereal.  4 oz (113 g) mashed potatoes or  of a large baked potato.  4 oz (113 g) canned or frozen fruit.  4 oz (120 mL) fruit juice.  4-6 crackers.  6 chicken nuggets.  6 oz (170 g) unsweetened dry cereal.  6 oz (170 g) plain fat-free yogurt or yogurt sweetened with artificial sweeteners.  8 oz (240 mL) milk.  8 oz (170 g) fresh fruit or one small piece of fruit.  24 oz (680 g) popped popcorn.  Example of carbohydrate counting Sample meal  3 oz (85 g) chicken  breast.  6 oz (170 g) brown rice.  4 oz (113 g) corn.  8 oz (240 mL) milk.  8 oz (170 g) strawberries with sugar-free whipped topping. Carbohydrate calculation 1. Identify the foods that contain carbohydrates: ? Rice. ? Corn. ? Milk. ? Strawberries. 2. Calculate how many servings you have of each food: ? 2 servings rice. ? 1 serving corn. ? 1 serving milk. ? 1 serving strawberries. 3. Multiply each number of servings by 15 g: ? 2 servings rice x 15 g = 30 g. ? 1 serving corn x 15 g = 15 g. ? 1 serving  milk x 15 g = 15 g. ? 1 serving strawberries x 15 g = 15 g. 4. Add together all of the amounts to find the total grams of carbohydrates eaten: ? 30 g + 15 g + 15 g + 15 g = 75 g of carbohydrates total. This information is not intended to replace advice given to you by your health care provider. Make sure you discuss any questions you have with your health care provider. Document Released: 11/20/2005 Document Revised: 06/09/2016 Document Reviewed: 05/03/2016 Elsevier Interactive Patient Education  [employer].

## 2018-08-27 NOTE — Assessment & Plan Note (Signed)
Encouraged heart healthy diet, increase exercise, avoid trans fats, consider a krill oil cap daily 

## 2018-08-27 NOTE — Assessment & Plan Note (Signed)
Supplement and monitor 

## 2018-08-27 NOTE — Assessment & Plan Note (Signed)
Check CBC 

## 2018-08-27 NOTE — Assessment & Plan Note (Signed)
Has been flared since changing her diet. Continue Zantac qhs and has not been taking Prevacid can consider restarting and avoid offending foods.

## 2018-08-27 NOTE — Assessment & Plan Note (Signed)
hgba1c acceptable, minimize simple carbs. Increase as tolerated.

## 2018-09-01 NOTE — Progress Notes (Signed)
Subjective:    Patient ID: [name] Jensen, female    DOB: 08-04-1956, 62 y.o.   MRN: [id number]  No chief complaint on file.   HPI Patient is in today for follow up. She continues to struggle with depression and fatigue but her current meds are helping her manage most days. Continues to work full time. She has dropped all carbs since her last visit an lost 24 pounds due to her concerns over her Hyperglycemia. No new concerns today. No recent febrile illness or hospitalizations. Denies CP/palp/SOB/HA/congestion/fevers/GI or GU c/o. Taking meds as prescribed  Past Medical History:  Diagnosis Date  . Adjustment reaction with anxiety and depression 06/12/2014  . Anemia    h/o low ferritin  . Anxiety   . Asthma    environmental triggers  . Chicken pox as a child  . Cough 08/22/2015  . Depression   . Depression with anxiety 06/12/2014  . Diverticulosis   . Ehrlichiosis 1962  . Gallstones   . GERD (gastroesophageal reflux disease)   . Hair loss 03/28/2017  . Hiatal hernia 2007  . History of viral illness   . Hyperlipidemia, mixed 04/11/2015  . Insomnia 08/21/2017  . Lactose intolerance 06/12/2014  . Mumps as a child  . Overweight 08/19/2014  . Rectocele 06/12/2014  . RLS (restless legs syndrome)   . 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 Sessile colonic polyp   . SVT (supraventricular tachycardia) ([place])   . Tachycardia 06/12/2014  . Thyroid disease   . Vitamin D deficiency 08/22/2015    Past Surgical History:  Procedure Laterality Date  . ABDOMINAL HYSTERECTOMY  11-2008   total with cystocele, rectocele repair  . APPENDECTOMY    . CHOLECYSTECTOMY    . COLONOSCOPY  2006   diverticulosis, hemorrhoids ([place])  . ESOPHAGOGASTRODUODENOSCOPY  2006   small hiatus hernia ([place])  . ESOPHAGOGASTRODUODENOSCOPY N/A 02/21/2018   Procedure: ESOPHAGOGASTRODUODENOSCOPY (EGD);  Surgeon: Rogene Houston, MD;  Location: [facility];  Service: Endoscopy;   Laterality: N/A;  pt knows to arrive at 2:45  . INCONTINENCE SURGERY    . SKIN SURGERY     right arm scc  . svt ablation    . TONSILLECTOMY  1972   2ith adenoid  . WISDOM TOOTH EXTRACTION  62 yrs old    Family History  Problem Relation Age of Onset  . Hypertension Mother   . Hypertension Father   . Hypertension Sister   . Diabetes Sister        type 2  . Fibromyalgia Sister   . GI Bleed Maternal Grandmother   . Atrial fibrillation Maternal Grandmother   . Heart disease Maternal Grandfather   . Diabetes Paternal Grandmother   . Stroke Paternal Grandfather   . Proteinuria Sister     Social History   Socioeconomic History  . Marital status: Divorced    Spouse name: Not on file  . Number of children: 2  . Years of education: Not on file  . Highest education level: Not on file  Occupational History  . Occupation: Programmer, multimedia: [place]  . Financial resource strain: Not on file  . Food insecurity:    Worry: Not on file    Inability: Not on file  . Transportation needs:    Medical: Not on file    Non-medical: Not on file  Tobacco Use  . Smoking status: Never Smoker  . Smokeless tobacco: Never  Used  Substance and Sexual Activity  . Alcohol use: No    Alcohol/week: 0.0 standard drinks  . Drug use: No  . Sexual activity: Not on file    Comment: lives by self with 4 dogs works at [employer], avoids dairy  Lifestyle  . Physical activity:    Days per week: Not on file    Minutes per session: Not on file  . Stress: Not on file  Relationships  . Social connections:    Talks on phone: Not on file    Gets together: Not on file    Attends religious service: Not on file    Active member of club or organization: Not on file    Attends meetings of clubs or organizations: Not on file    Relationship status: Not on file  . Intimate partner violence:    Fear of current or ex partner: Not on file    Emotionally abused: Not on file    Physically abused:  Not on file    Forced sexual activity: Not on file  Other Topics Concern  . Not on file  Social History Narrative  . Not on file    Outpatient Medications Prior to Visit  Medication Sig Dispense Refill  . albuterol (PROVENTIL HFA;VENTOLIN HFA) 108 (90 Base) MCG/ACT inhaler Inhale 2 puffs into the lungs every 6 (six) hours as needed for wheezing. 1 Inhaler 6  . aspirin (ASPIRIN EC) 81 MG EC tablet Take 81 mg by mouth at bedtime.     . beclomethasone (QVAR) 80 MCG/ACT inhaler Inhale 1 puff into the lungs daily. (Patient taking differently: Inhale 1 puff into the lungs daily as needed (for shortness of breath or wheezing). ) 1 Inhaler 6  . Cholecalciferol (VITAMIN D3) 5000 units CAPS Take 5,000 Units by mouth daily.     . lansoprazole (PREVACID) 30 MG capsule Take 1 capsule (30 mg total) by mouth daily before breakfast. 30 capsule 5  . LORazepam (ATIVAN) 1 MG tablet TAKE 1/2 TO 1 TABLET BY MOUTH EVERY 8 HOURS AS NEEDED FOR ANXIETY 70 tablet 3  . metoprolol succinate (TOPROL XL) 100 MG 24 hr tablet Take 1 tablet (100 mg total) by mouth 2 (two) times daily. Take with or immediately following a meal. 180 tablet 3  . nystatin cream (MYCOSTATIN) Apply 1 application topically 2 (two) times daily. 30 g 2  . Omega-3 Fatty Acids (FISH OIL) 1000 MG CAPS Take 2,000 mg by mouth 3 (three) times a week.     . ranitidine (ZANTAC) 300 MG tablet TAKE 1 TABLET BY MOUTH AT BEDTIME AS NEEDED FOR HEARTBURN 90 tablet 3  . Spacer/Aero Chamber Mouthpiece MISC Use with Albuterol HFA as directed 1 each 1  . temazepam (RESTORIL) 30 MG capsule TAKE 1 CAPSULE BY MOUTH ONCE AT BEDTIME 30 capsule 3  . traZODone (DESYREL) 150 MG tablet TAKE 1 TABLET (150 MG TOTAL) BY MOUTH AT BEDTIME. 90 tablet 1  . vortioxetine HBr (TRINTELLIX) 10 MG TABS tablet Take 1 tablet (10 mg total) by mouth daily. 30 tablet 2  . fluconazole (DIFLUCAN) 150 MG tablet 2 tabs po daily x 5 days then 1 tab po q week x 5 weeks. (Patient not taking: Reported  on 07/17/2018) 15 tablet 0  . hyoscyamine (LEVSIN SL) 0.125 MG SL tablet Place 1 tablet (0.125 mg total) under the tongue every 4 (four) hours as needed. (Patient not taking: Reported on 07/17/2018) 30 tablet 1   No facility-administered medications prior to visit.  Allergies  Allergen Reactions  . Dilaudid [Hydromorphone Hcl] Other (See Comments)    Respiratory , cardiac     Review of Systems  Constitutional: Positive for malaise/fatigue. Negative for fever.  HENT: Negative for congestion.   Eyes: Negative for blurred vision.  Respiratory: Negative for shortness of breath.   Cardiovascular: Negative for chest pain, palpitations and leg swelling.  Gastrointestinal: Negative for abdominal pain, blood in stool and nausea.  Genitourinary: Negative for dysuria and frequency.  Musculoskeletal: Negative for falls.  Skin: Negative for rash.  Neurological: Negative for dizziness, loss of consciousness and headaches.  Endo/Heme/Allergies: Negative for environmental allergies.  Psychiatric/Behavioral: Positive for depression. The patient is nervous/anxious.        Objective:    Physical Exam  BP 122/68 (BP Location: Left Arm, Patient Position: Sitting, Cuff Size: Normal)   Pulse 81   Temp 98.1 F (36.7 C) (Oral)   Resp 18   Ht 5\' 4"  (1.626 m)   Wt 178 lb 3.2 oz (80.8 kg)   SpO2 97%   BMI 30.59 kg/m  Wt Readings from Last 3 Encounters:  08/27/18 178 lb 3.2 oz (80.8 kg)  06/20/18 200 lb (90.7 kg)  05/30/18 203 lb 12.8 oz (92.4 kg)     Lab Results  Component Value Date   WBC 6.9 06/20/2018   HGB 13.6 06/20/2018   HCT 39.5 06/20/2018   PLT 295.0 06/20/2018   GLUCOSE 129 (H) 06/20/2018   CHOL 198 06/20/2018   TRIG 229.0 (H) 06/20/2018   HDL 46.40 06/20/2018   LDLDIRECT 101.0 06/20/2018   LDLCALC 112 (H) 02/06/2018   ALT 25 06/20/2018   AST 18 06/20/2018   NA 139 06/20/2018   K 3.8 06/20/2018   CL 103 06/20/2018   CREATININE 0.77 06/20/2018   BUN 12 06/20/2018    CO2 27 06/20/2018   TSH 1.53 06/20/2018   INR 1.0 11/09/2008   HGBA1C 6.3 06/20/2018    Lab Results  Component Value Date   TSH 1.53 06/20/2018   Lab Results  Component Value Date   WBC 6.9 06/20/2018   HGB 13.6 06/20/2018   HCT 39.5 06/20/2018   MCV 83.0 06/20/2018   PLT 295.0 06/20/2018   Lab Results  Component Value Date   NA 139 06/20/2018   K 3.8 06/20/2018   CO2 27 06/20/2018   GLUCOSE 129 (H) 06/20/2018   BUN 12 06/20/2018   CREATININE 0.77 06/20/2018   BILITOT 0.3 06/20/2018   ALKPHOS 90 06/20/2018   AST 18 06/20/2018   ALT 25 06/20/2018   PROT 7.2 06/20/2018   ALBUMIN 4.5 06/20/2018   CALCIUM 9.5 06/20/2018   ANIONGAP 12 01/24/2018   GFR 81.30 06/20/2018   Lab Results  Component Value Date   CHOL 198 06/20/2018   Lab Results  Component Value Date   HDL 46.40 06/20/2018   Lab Results  Component Value Date   LDLCALC 112 (H) 02/06/2018   Lab Results  Component Value Date   TRIG 229.0 (H) 06/20/2018   Lab Results  Component Value Date   CHOLHDL 4 06/20/2018   Lab Results  Component Value Date   HGBA1C 6.3 06/20/2018       Assessment & Plan:   Problem List Items Addressed This Visit    Anemia    Check CBC      Relevant Orders   CBC   TSH   CBC   TSH   Obesity    Good weight loss since her last visit.  Has been avoiding all carbs and staying active.       Diarrhea    Is doing much better since her last visit. Avoid offending foods and encouraged fiber and probiotic supplements.       Relevant Orders   CBC   TSH   TSH   Hyperlipidemia, mixed    Encouraged heart healthy diet, increase exercise, avoid trans fats, consider a krill oil cap daily      Relevant Orders   Lipid panel   TSH   Lipid panel   TSH   Hyperglycemia    hgba1c acceptable, minimize simple carbs. Increase as tolerated.       Relevant Orders   Hemoglobin A1c   Comprehensive metabolic panel   Hemoglobin A1c   Comprehensive metabolic panel   TSH    Vitamin D deficiency    Supplement and monitor      Relevant Orders   Comprehensive metabolic panel   VITAMIN D 25 Hydroxy (Vit-D Deficiency, Fractures)   VITAMIN D 25 Hydroxy (Vit-D Deficiency, Fractures)   Gastroesophageal reflux disease without esophagitis    Has been flared since changing her diet. Continue Zantac qhs and has not been taking Prevacid can consider restarting and avoid offending foods.      Relevant Orders   Comprehensive metabolic panel      I have discontinued [facility]etta [facility] A. Cutler's fluconazole and hyoscyamine. I am also having her maintain her aspirin, Vitamin D3, Fish Oil, albuterol, beclomethasone, Spacer/Aero Chamber Mouthpiece, nystatin cream, metoprolol succinate, lansoprazole, traZODone, LORazepam, temazepam, ranitidine, and vortioxetine HBr.  No orders of the defined types were placed in this encounter.    Penni Homans, MD

## 2018-09-01 NOTE — Assessment & Plan Note (Signed)
Good weight loss since her last visit. Has been avoiding all carbs and staying active.

## 2018-09-01 NOTE — Assessment & Plan Note (Signed)
Struggles daily but acknowledges the meds do help her manage most days. No changes today

## 2018-09-12 ENCOUNTER — Other Ambulatory Visit: Payer: Self-pay | Admitting: Family Medicine

## 2018-09-12 DIAGNOSIS — Z1231 Encounter for screening mammogram for malignant neoplasm of breast: Secondary | ICD-10-CM

## 2018-09-23 MED FILL — LORazepam 1 MG TABS: 1 | 23 days supply | Qty: 70 | Fill #3

## 2018-09-23 MED FILL — TEMAZEPAM 30 MG CAPSULE: 30 | 30 days supply | Qty: 30 | Fill #2

## 2018-09-25 ENCOUNTER — Other Ambulatory Visit (INDEPENDENT_AMBULATORY_CARE_PROVIDER_SITE_OTHER): Payer: 59

## 2018-09-25 ENCOUNTER — Encounter: Payer: Self-pay | Admitting: Family Medicine

## 2018-09-25 DIAGNOSIS — E559 Vitamin D deficiency, unspecified: Secondary | ICD-10-CM

## 2018-09-25 DIAGNOSIS — R739 Hyperglycemia, unspecified: Secondary | ICD-10-CM | POA: Diagnosis not present

## 2018-09-25 DIAGNOSIS — K219 Gastro-esophageal reflux disease without esophagitis: Secondary | ICD-10-CM | POA: Diagnosis not present

## 2018-09-25 DIAGNOSIS — R197 Diarrhea, unspecified: Secondary | ICD-10-CM

## 2018-09-25 DIAGNOSIS — E782 Mixed hyperlipidemia: Secondary | ICD-10-CM | POA: Diagnosis not present

## 2018-09-25 DIAGNOSIS — D649 Anemia, unspecified: Secondary | ICD-10-CM

## 2018-09-25 LAB — CBC
HEMATOCRIT: 38.4 % (ref 36.0–46.0)
Hemoglobin: 13.3 g/dL (ref 12.0–15.0)
MCHC: 34.5 g/dL (ref 30.0–36.0)
MCV: 83 fl (ref 78.0–100.0)
Platelets: 285 10*3/uL (ref 150.0–400.0)
RBC: 4.63 Mil/uL (ref 3.87–5.11)
RDW: 13.3 % (ref 11.5–15.5)
WBC: 6.9 10*3/uL (ref 4.0–10.5)

## 2018-09-25 LAB — COMPREHENSIVE METABOLIC PANEL
ALBUMIN: 4.3 g/dL (ref 3.5–5.2)
ALK PHOS: 71 U/L (ref 39–117)
ALT: 18 U/L (ref 0–35)
AST: 13 U/L (ref 0–37)
BUN: 20 mg/dL (ref 6–23)
CO2: 28 mEq/L (ref 19–32)
CREATININE: 0.72 mg/dL (ref 0.40–1.20)
Calcium: 9.6 mg/dL (ref 8.4–10.5)
Chloride: 106 mEq/L (ref 96–112)
GFR: 87.77 mL/min (ref >60.00)
GLUCOSE: 99 mg/dL (ref 70–99)
Potassium: 4.1 mEq/L (ref 3.5–5.1)
SODIUM: 140 meq/L (ref 135–145)
TOTAL PROTEIN: 7.1 g/dL (ref 6.0–8.3)
Total Bilirubin: 0.3 mg/dL (ref 0.2–1.2)

## 2018-09-25 LAB — LIPID PANEL
Cholesterol: 176 mg/dL (ref 0–200)
HDL: 40.4 mg/dL (ref >39.00)
Total CHOL/HDL Ratio: 4
Triglycerides: 455 mg/dL — ABNORMAL HIGH (ref 0.0–149.0)

## 2018-09-25 LAB — HEMOGLOBIN A1C: HEMOGLOBIN A1C: 5.9 % (ref 4.6–6.5)

## 2018-09-25 LAB — LDL CHOLESTEROL, DIRECT: Direct LDL: 91 mg/dL

## 2018-09-25 LAB — VITAMIN D 25 HYDROXY (VIT D DEFICIENCY, FRACTURES): VITD: 59.37 ng/mL (ref 30.00–100.00)

## 2018-09-25 LAB — TSH: TSH: 1.2 u[IU]/mL (ref 0.35–4.50)

## 2018-09-30 ENCOUNTER — Ambulatory Visit (HOSPITAL_COMMUNITY): Payer: 59

## 2018-10-09 ENCOUNTER — Ambulatory Visit (HOSPITAL_COMMUNITY)
Admission: RE | Admit: 2018-10-09 | Discharge: 2018-10-09 | Disposition: A | Discharge status: Home / Self Care | Payer: 59 | Source: Ambulatory Visit | Attending: Family Medicine | Admitting: Family Medicine

## 2018-10-09 DIAGNOSIS — Z1231 Encounter for screening mammogram for malignant neoplasm of breast: Secondary | ICD-10-CM | POA: Diagnosis not present

## 2018-10-10 ENCOUNTER — Encounter: Payer: Self-pay | Admitting: Family Medicine

## 2018-10-10 DIAGNOSIS — E782 Mixed hyperlipidemia: Secondary | ICD-10-CM

## 2018-10-14 NOTE — Addendum Note (Signed)
Addended by: Kelle Darting A on: 10/14/2018 03:53 PM   Modules accepted: Orders

## 2018-10-16 ENCOUNTER — Other Ambulatory Visit: Payer: 59

## 2018-10-21 ENCOUNTER — Other Ambulatory Visit: Payer: Self-pay | Admitting: Family Medicine

## 2018-10-21 MED FILL — METOPROLOL SUCCINATE ER 100: 100 | 90 days supply | Qty: 180 | Fill #3

## 2018-10-21 MED FILL — TEMAZEPAM 30 MG CAPSULE: 30 | 30 days supply | Qty: 30 | Fill #3

## 2018-10-21 MED FILL — raNITIdine HCL 300 MG TABS: 300 | 90 days supply | Qty: 90 | Fill #1

## 2018-10-22 ENCOUNTER — Other Ambulatory Visit (INDEPENDENT_AMBULATORY_CARE_PROVIDER_SITE_OTHER): Payer: 59

## 2018-10-22 ENCOUNTER — Encounter: Payer: Self-pay | Admitting: Family Medicine

## 2018-10-22 DIAGNOSIS — E782 Mixed hyperlipidemia: Secondary | ICD-10-CM | POA: Diagnosis not present

## 2018-10-22 LAB — LIPID PANEL
Cholesterol: 196 mg/dL (ref 0–200)
HDL: 47.9 mg/dL (ref >39.00)
LDL Cholesterol: 118 mg/dL — ABNORMAL HIGH (ref 0–99)
NONHDL: 147.79
Total CHOL/HDL Ratio: 4
Triglycerides: 149 mg/dL (ref 0.0–149.0)
VLDL: 29.8 mg/dL (ref 0.0–40.0)

## 2018-10-24 MED FILL — TRINTELLIX 10 MG TABLET: 10 | 30 days supply | Qty: 30 | Fill #0

## 2018-10-24 MED FILL — LORazepam 1 MG TABS: 1 | 23 days supply | Qty: 70 | Fill #0

## 2018-10-24 NOTE — Telephone Encounter (Signed)
Requesting:ativan  Contract:yes ZTA:[employer] employee Last OV:08/27/18 Next OV:not scheduled  Last Refill:06/10/18  #[id number] Database:   Please advise

## 2018-10-24 NOTE — Telephone Encounter (Signed)
These Rx was submitted on 11/18.  vortioxetine HBr (TRINTELLIX) 10 MG TABS tablet LORazepam (ATIVAN) 1 MG tablet Please send to   [place], [place] - [address] (Phone) [phone] (Fax)

## 2018-11-11 ENCOUNTER — Encounter: Payer: Self-pay | Admitting: Family Medicine

## 2018-11-11 ENCOUNTER — Emergency Department (HOSPITAL_COMMUNITY): Payer: 59

## 2018-11-11 ENCOUNTER — Ambulatory Visit: Payer: Self-pay

## 2018-11-11 ENCOUNTER — Ambulatory Visit: Payer: 59 | Admitting: Family Medicine

## 2018-11-11 ENCOUNTER — Emergency Department (HOSPITAL_COMMUNITY)
Admission: EM | Admit: 2018-11-11 | Discharge: 2018-11-11 | Disposition: A | Discharge status: Home / Self Care | Payer: 59 | Attending: Emergency Medicine | Admitting: Emergency Medicine

## 2018-11-11 ENCOUNTER — Encounter (HOSPITAL_COMMUNITY): Payer: Self-pay | Admitting: *Deleted

## 2018-11-11 ENCOUNTER — Other Ambulatory Visit: Payer: Self-pay

## 2018-11-11 VITALS — BP 122/86 | HR 101 | Temp 98.1°F | Ht 64.0 in | Wt 167.2 lb

## 2018-11-11 DIAGNOSIS — Z85828 Personal history of other malignant neoplasm of skin: Secondary | ICD-10-CM | POA: Insufficient documentation

## 2018-11-11 DIAGNOSIS — R197 Diarrhea, unspecified: Secondary | ICD-10-CM

## 2018-11-11 DIAGNOSIS — R109 Unspecified abdominal pain: Secondary | ICD-10-CM | POA: Diagnosis not present

## 2018-11-11 DIAGNOSIS — R1032 Left lower quadrant pain: Secondary | ICD-10-CM

## 2018-11-11 DIAGNOSIS — K5792 Diverticulitis of intestine, part unspecified, without perforation or abscess without bleeding: Secondary | ICD-10-CM | POA: Diagnosis not present

## 2018-11-11 DIAGNOSIS — Z79899 Other long term (current) drug therapy: Secondary | ICD-10-CM | POA: Diagnosis not present

## 2018-11-11 DIAGNOSIS — J45909 Unspecified asthma, uncomplicated: Secondary | ICD-10-CM | POA: Insufficient documentation

## 2018-11-11 DIAGNOSIS — K5730 Diverticulosis of large intestine without perforation or abscess without bleeding: Secondary | ICD-10-CM | POA: Diagnosis not present

## 2018-11-11 DIAGNOSIS — R10A2 Flank pain, left side: Secondary | ICD-10-CM

## 2018-11-11 LAB — CBC WITH DIFFERENTIAL/PLATELET
BASOS ABS: 0 10*3/uL (ref 0.0–0.1)
Basophils Relative: 0.2 % (ref 0.0–3.0)
EOS ABS: 0 10*3/uL (ref 0.0–0.7)
Eosinophils Relative: 0.1 % (ref 0.0–5.0)
HCT: 41.1 % (ref 36.0–46.0)
Hemoglobin: 14 g/dL (ref 12.0–15.0)
LYMPHS ABS: 2.4 10*3/uL (ref 0.7–4.0)
Lymphocytes Relative: 19.6 % (ref 12.0–46.0)
MCHC: 33.9 g/dL (ref 30.0–36.0)
MCV: 83.3 fl (ref 78.0–100.0)
Monocytes Absolute: 0.6 10*3/uL (ref 0.1–1.0)
Monocytes Relative: 4.7 % (ref 3.0–12.0)
NEUTROS ABS: 9.4 10*3/uL — AB (ref 1.4–7.7)
NEUTROS PCT: 75.4 % (ref 43.0–77.0)
PLATELETS: 302 10*3/uL (ref 150.0–400.0)
RBC: 4.94 Mil/uL (ref 3.87–5.11)
RDW: 13.8 % (ref 11.5–15.5)
WBC: 12.4 10*3/uL — ABNORMAL HIGH (ref 4.0–10.5)

## 2018-11-11 LAB — POCT URINALYSIS DIPSTICK
BILIRUBIN UA: NEGATIVE
Glucose, UA: NEGATIVE
Ketones, UA: NEGATIVE
LEUKOCYTES UA: NEGATIVE
NITRITE UA: NEGATIVE
PH UA: 6 (ref 5.0–8.0)
PROTEIN UA: NEGATIVE
Spec Grav, UA: 1.015 (ref 1.010–1.025)
UROBILINOGEN UA: 0.2 U/dL

## 2018-11-11 LAB — BASIC METABOLIC PANEL
BUN: 12 mg/dL (ref 6–23)
CHLORIDE: 101 meq/L (ref 96–112)
CO2: 25 mEq/L (ref 19–32)
CREATININE: 0.79 mg/dL (ref 0.40–1.20)
Calcium: 9.8 mg/dL (ref 8.4–10.5)
GFR: 78.82 mL/min (ref >60.00)
Glucose, Bld: 112 mg/dL — ABNORMAL HIGH (ref 70–99)
POTASSIUM: 4.1 meq/L (ref 3.5–5.1)
SODIUM: 136 meq/L (ref 135–145)

## 2018-11-11 LAB — HEPATIC FUNCTION PANEL
ALK PHOS: 74 U/L (ref 38–126)
ALT: 27 U/L (ref 0–44)
AST: 26 U/L (ref 15–41)
Albumin: 4 g/dL (ref 3.5–5.0)
BILIRUBIN TOTAL: 1.1 mg/dL (ref 0.3–1.2)
Bilirubin, Direct: 0.1 mg/dL (ref 0.0–0.2)
Indirect Bilirubin: 1 mg/dL — ABNORMAL HIGH (ref 0.3–0.9)
Total Protein: 7.3 g/dL (ref 6.5–8.1)

## 2018-11-11 LAB — LIPASE, BLOOD: Lipase: 28 U/L (ref 11–51)

## 2018-11-11 MED ORDER — SODIUM CHLORIDE 0.9 % IV BOLUS
1000.0000 mL | Freq: Once | INTRAVENOUS | Status: AC
  Administered 2018-11-11: 1000 mL via INTRAVENOUS

## 2018-11-11 MED ORDER — IOPAMIDOL (ISOVUE-300) INJECTION 61%
100.0000 mL | Freq: Once | INTRAVENOUS | Status: AC | PRN
  Administered 2018-11-11: 100 mL via INTRAVENOUS

## 2018-11-11 MED ORDER — METRONIDAZOLE 500 MG PO TABS: 500.0000 mg | ORAL_TABLET | Freq: Three times a day (TID) | ORAL | 0 refills | Status: DC

## 2018-11-11 MED ORDER — CIPROFLOXACIN HCL 500 MG PO TABS: 500.0000 mg | ORAL_TABLET | Freq: Two times a day (BID) | ORAL | 0 refills | Status: DC

## 2018-11-11 MED ORDER — IOPAMIDOL (ISOVUE-300) INJECTION 61%
INTRAVENOUS | Status: AC
  Filled 2018-11-11: qty 100

## 2018-11-11 MED ORDER — SODIUM CHLORIDE (PF) 0.9 % IJ SOLN
INTRAMUSCULAR | Status: AC
  Filled 2018-11-11: qty 50

## 2018-11-11 MED ORDER — ONDANSETRON HCL 4 MG/2ML IJ SOLN
4.0000 mg | Freq: Once | INTRAMUSCULAR | Status: AC
  Administered 2018-11-11: 4 mg via INTRAVENOUS
  Filled 2018-11-11: qty 2

## 2018-11-11 NOTE — Progress Notes (Signed)
Patient: [name] Jensen MRN: [id number] DOB: 1956/02/04 PCP: [name], MD     Subjective:  Chief Complaint  Patient presents with  . LLQ pain    HPI: The patient is a 62 y.o. female who presents today for LLQ pain/gas pain. She had some feta cheese and it didn't taste right in the afternoon. She then started to have this intense LLQ pain that feels like a huge spasm of gas. She has taken some gas medication and it hasn't helped. She didn't eat or drink. The pain woke her up in the middle of the night. She has known diverticulosis. She has had her appendix/gallbladder out. She also had total hysterectomy transvaginally.   She has had no fever/chills, on blood in her stool. She has had no BM.  Pain in her LLQ rated as a 6/10, but can get worse. Mild radiation to her right flank. Pain is constant, but severity comes and goes. No vomiting, some nausea.   Review of Systems  Respiratory: Negative for shortness of breath.   Cardiovascular: Negative for chest pain.  Gastrointestinal: Positive for abdominal pain and nausea. Negative for vomiting.       LLQ pain that occ radiates to left flank.    Genitourinary: Positive for flank pain and pelvic pain. Negative for dysuria, frequency and urgency.       Left flank pain occasionally  Musculoskeletal: Negative for back pain.    Allergies Patient is allergic to dilaudid [hydromorphone hcl].  Past Medical History Patient  has a past medical history of Adjustment reaction with anxiety and depression (06/12/2014), Anemia, Anxiety, Asthma, Chicken pox (as a child), Cough (08/22/2015), Depression, Depression with anxiety (06/12/2014), Diverticulosis, Ehrlichiosis (7564), Gallstones, GERD (gastroesophageal reflux disease), Hair loss (03/28/2017), Hiatal hernia (2007), History of viral illness, Hyperlipidemia, mixed (04/11/2015), Insomnia (08/21/2017), Lactose intolerance (06/12/2014), Mumps (as a child), Overweight (08/19/2014), Rectocele  (06/12/2014), RLS (restless legs syndrome), SCC (squamous cell carcinoma) (06/12/2014), Sessile colonic polyp, SVT (supraventricular tachycardia) ([place]), Tachycardia (06/12/2014), Thyroid disease, and Vitamin D deficiency (08/22/2015).  Surgical History Patient  has a past surgical history that includes Cholecystectomy; Appendectomy; Tonsillectomy (1972); Wisdom tooth extraction (62 yrs old); Abdominal hysterectomy (11-2008); Skin surgery; Colonoscopy (2006); Esophagogastroduodenoscopy (2006); svt ablation; Incontinence surgery; and Esophagogastroduodenoscopy (N/A, 02/21/2018).  Family History Pateint's family history includes Atrial fibrillation in her maternal grandmother; Diabetes in her paternal grandmother and sister; Fibromyalgia in her sister; GI Bleed in her maternal grandmother; Heart disease in her maternal grandfather; Hypertension in her father, mother, and sister; Proteinuria in her sister; Stroke in her paternal grandfather.  Social History Patient  reports that she has never smoked. She has never used smokeless tobacco. She reports that she does not drink alcohol or use drugs.    Objective: Vitals:   11/11/18 1041  BP: 122/86  Pulse: (!) 101  Temp: 98.1 F (36.7 C)  TempSrc: Oral  SpO2: 98%  Weight: 167 lb 3.2 oz (75.8 kg)  Height: 5\' 4"  (1.626 m)    Body mass index is 28.7 kg/m.  Physical Exam  Constitutional: She appears well-developed and well-nourished.  Cardiovascular: Normal rate, regular rhythm and normal heart sounds.  Pulmonary/Chest: Effort normal and breath sounds normal.  Abdominal: Soft. Bowel sounds are normal. There is tenderness (severe TTP in LLQ). There is guarding. There is no rebound.  She has pain in her LLQ when I pressed suprapubically   Vitals reviewed.      Assessment/plan: 1. LLQ pain Acute abdomen. Was going to send for stat  CT, but she is feeling worse. Sending to ER. Differential for diverticulitis, bowel obstruction. Stat labs done here  today.  - CBC with Differential/Platelet - Basic metabolic panel - CT Abdomen Pelvis W Contrast; Future  2. Left flank pain  - POCT urinalysis dipstick   Return if symptoms worsen or fail to improve.   Orma Flaming, MD [place]   11/11/2018

## 2018-11-11 NOTE — Addendum Note (Signed)
Addended by: Orma Flaming on: 11/11/2018 02:38 PM   Modules accepted: Orders

## 2018-11-11 NOTE — ED Triage Notes (Signed)
Pt sent here from provider and after being evaluated for acute abd pain that began around 15:30 yesterday afternoon.  Lab were taken at the doctor's office and upon palpation by her doctor, pt had extreme pain in the LLQ.  Pt reports some radiation to the left flank area.  Pt hx of abd surgeries.

## 2018-11-11 NOTE — ED Notes (Signed)
Bed: DS89 Expected date:  Expected time:  Means of arrival:  Comments: 65

## 2018-11-11 NOTE — Telephone Encounter (Signed)
rec'd phone call from pt. With c/o intermittent abdominal pain in left lower quadrant.  Reported the pain started yesterday afternoon; has intermittent "spasm-type" pain; rated at 6-7/10.  C/o nausea.  Denied vomiting.  Denied fever.  Stated she is not able to burp or pass gas. Denied abdominal bloating.  Stated her stools are "pencil thin"; stated with her IBS, this is not unusual for her to have narrow stools.  Voiced concern of diverticulitis.   Called [facility] at [facility].  No available appts. With PCP; next avail. Appt. at 5:00 PM with NP.  Advised to send note to PCP for review.  Pt. Advised of the above.  Advised if not able to be seen at PCP office, she would need to sched. Appt. At alternate office or go to ER.  Stated she did not want to go to ER.  Appt. Sched. With [facility] at 10:40 AM.  Care advice given per protocol.  Verb. Understanding.              Reason for Disposition . [1] MILD-MODERATE pain AND [2] constant AND [3] present > 2 hours  Answer Assessment - Initial Assessment Questions 1. LOCATION: "Where does it hurt?"      Left lower quadrant  2. RADIATION: "Does the pain shoot anywhere else?" (e.g., chest, back)     Denied 3. ONSET: "When did the pain begin?" (e.g., minutes, hours or days ago)      12/8 afternoon 4. SUDDEN: "Gradual or sudden onset?"     Sudden 5. PATTERN "Does the pain come and go, or is it constant?"    - If constant: "Is it getting better, staying the same, or worsening?"      (Note: Constant means the pain never goes away completely; most serious pain is constant and it progresses)     - If intermittent: "How long does it last?" "Do you have pain now?"     (Note: Intermittent means the pain goes away completely between bouts)     Intermittent  6. SEVERITY: "How bad is the pain?"  (e.g., Scale 1-10; mild, moderate, or severe)   - MILD (1-3): doesn't interfere with normal activities, abdomen soft and not tender to touch    - MODERATE (4-7): interferes  with normal activities or awakens from sleep, tender to touch    - SEVERE (8-10): excruciating pain, doubled over, unable to do any normal activities      6-7/10 7. RECURRENT SYMPTOM: "Have you ever had this type of abdominal pain before?" If so, ask: "When was the last time?" and "What happened that time?"     Yes;  8. CAUSE: "What do you think is causing the abdominal pain?"     diverticulitis 9. RELIEVING/AGGRAVATING FACTORS: "What makes it better or worse?" (e.g., movement, antacids, bowel movement)     Simethicone- did not help  10. OTHER SYMPTOMS: "Has there been any vomiting, diarrhea, constipation, or urine problems?"       Nausea, abdominal cramping, pencil thin stools- not changed ; denied fever   11. PREGNANCY: "Is there any chance you are pregnant?" "When was your last menstrual period?"       hysterectomy  Protocols used: ABDOMINAL PAIN - [facility]

## 2018-11-11 NOTE — Discharge Instructions (Addendum)
Your CT scan showed likely diverticulitis, there was also mention of need for repeat evaluation to rule out possible cancerous process, please discuss with your primary care provider.  Please continue your Flagyl and ciprofloxacin as prescribed by your primary care.  Please generally follow the clear liquid diet for the first 24 hours.  Follow-up with primary care in the next 3 to 5 days for reevaluation.  Return to the ER for new or worsening symptoms including but limited to worsening pain, inability to keep fluids down, blood in stool, or any other concerns.

## 2018-11-11 NOTE — Patient Instructions (Signed)
Liquid diet x 2-3 days Start abx.  Stat CT since your belly is acutely tender.   ER if fever/worsening pain or blood in stool   Diverticulitis Diverticulitis is when small pockets in your large intestine (colon) get infected or swollen. This causes stomach pain and watery poop (diarrhea). These pouches are called diverticula. They form in people who have a condition called diverticulosis. Follow these instructions at home: Medicines  Take over-the-counter and prescription medicines only as told by your doctor. These include: ? Antibiotics. ? Pain medicines. ? Fiber pills. ? Probiotics. ? Stool softeners.  Do not drive or use heavy machinery while taking prescription pain medicine.  If you were prescribed an antibiotic, take it as told. Do not stop taking it even if you feel better. General instructions  Follow a diet as told by your doctor.  When you feel better, your doctor may tell you to change your diet. You may need to eat a lot of fiber. Fiber makes it easier to poop (have bowel movements). Healthy foods with fiber include: ? Berries. ? Beans. ? Lentils. ? Green vegetables.  Exercise 3 or more times a week. Aim for 30 minutes each time. Exercise enough to sweat and make your heart beat faster.  Keep all follow-up visits as told. This is important. You may need to have an exam of the large intestine. This is called a colonoscopy. Contact a doctor if:  Your pain does not get better.  You have a hard time eating or drinking.  You are not pooping like normal. Get help right away if:  Your pain gets worse.  Your problems do not get better.  Your problems get worse very fast.  You have a fever.  You throw up (vomit) more than one time.  You have poop that is: ? Bloody. ? Black. ? Tarry. Summary  Diverticulitis is when small pockets in your large intestine (colon) get infected or swollen.  Take medicines only as told by your doctor.  Follow a diet as  told by your doctor. This information is not intended to replace advice given to you by your health care provider. Make sure you discuss any questions you have with your health care provider. Document Released: 05/08/2008 Document Revised: 12/07/2016 Document Reviewed: 12/07/2016 Elsevier Interactive Patient Education  2017 [employer].

## 2018-11-11 NOTE — ED Notes (Signed)
Fluids offered.  

## 2018-11-11 NOTE — ED Provider Notes (Signed)
[facility] DEPT Provider Note   CSN: [id number] Arrival date & time: 11/11/18  1241     History   Chief Complaint Chief Complaint  Patient presents with  . Abdominal Pain    HPI [name] Jensen is a 62 y.o. female with a hx of diverticulosis as well as prior cholecystectomy, hysterectomy, and appendectomy who presents to the ED at request of PCP office for evaluation of her abdominal pain. Patient states she developed LLQ abdominal pain yesterday, waxing/waning, current pain is an 8/10 in severity, worse with extension of the legs, no alleviating factors. Some nausea, no vomiting. Last BM 3 days prior, this is not necessarily atypical for her, she is belching but is not passing gas. Reports chills. Denies fever, vomiting, melena, hematochezia, dysuria, hematuria, vaginal bleeding/discharge, chest pain, or dyspnea.   Seen by PCP, concern for acute abdomen, given prescriptions for flagyl/cipro for presumed diverticulitis (patient has had 1 dose of each this AM), and instructed to come to ED for CT scan. Had CBC w/  Leukocytosis of 12.4 w/ left shift. BMP normal. Urine normal.   HPI  Past Medical History:  Diagnosis Date  . Adjustment reaction with anxiety and depression 06/12/2014  . Anemia    h/o low ferritin  . Anxiety   . Asthma    environmental triggers  . Chicken pox as a child  . Cough 08/22/2015  . Depression   . Depression with anxiety 06/12/2014  . Diverticulosis   . Ehrlichiosis 4098  . Gallstones   . GERD (gastroesophageal reflux disease)   . Hair loss 03/28/2017  . Hiatal hernia 2007  . History of viral illness   . Hyperlipidemia, mixed 04/11/2015  . Insomnia 08/21/2017  . Lactose intolerance 06/12/2014  . Mumps as a child  . Overweight 08/19/2014  . Rectocele 06/12/2014  . RLS (restless legs syndrome)   . 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 Sessile colonic polyp   . SVT (supraventricular  tachycardia) ([place])   . Tachycardia 06/12/2014  . Thyroid disease   . Vitamin D deficiency 08/22/2015    Patient Active Problem List   Diagnosis Date Noted  . Gastroesophageal reflux disease without esophagitis 02/08/2018  . Insomnia 08/21/2017  . Hair loss 03/28/2017  . Acute pain of right knee 03/28/2017  . Vitamin D deficiency 08/22/2015  . Cough 08/22/2015  . Left-sided thoracic back pain 04/14/2015  . Hyperlipidemia, mixed 04/11/2015  . Hyperglycemia 04/11/2015  . Abdominal pain 10/07/2014  . Family history of colon cancer in mother per 2006 GI note Dr. Watt Climes 10/04/2014  . Diarrhea 10/04/2014  . Preventative health care 08/19/2014  . Benign paroxysmal positional vertigo 08/19/2014  . Obesity 08/19/2014  . Multinodular goiter (nontoxic) 06/29/2014  . Cystocele 06/12/2014  . Rectocele 06/12/2014  . SCC (squamous cell carcinoma) 06/12/2014  . Lactose intolerance 06/12/2014  . OSA (obstructive sleep apnea) 06/12/2014  . Tachycardia 06/12/2014  . Depression with anxiety 06/12/2014  . Headache 06/12/2014  . GERD (gastroesophageal reflux disease) 06/12/2014  . Euthyroid Hashimoto's thyroiditis   . Chicken pox   . Mumps   . Anemia   . Asthma   . 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 RESTLESS LEG SYNDROME 02/04/2009    Past Surgical History:  Procedure Laterality Date  . ABDOMINAL HYSTERECTOMY  11-2008   total with cystocele, rectocele repair  . APPENDECTOMY    . CHOLECYSTECTOMY    . COLONOSCOPY  2006  diverticulosis, hemorrhoids ([place])  . ESOPHAGOGASTRODUODENOSCOPY  2006   small hiatus hernia ([place])  . ESOPHAGOGASTRODUODENOSCOPY N/A 02/21/2018   Procedure: ESOPHAGOGASTRODUODENOSCOPY (EGD);  Surgeon: Rogene Houston, MD;  Location: [facility];  Service: Endoscopy;  Laterality: N/A;  pt knows to arrive at 2:45  . INCONTINENCE SURGERY    . SKIN SURGERY     right arm scc  . svt ablation    . TONSILLECTOMY  1972   2ith adenoid  . WISDOM  TOOTH EXTRACTION  62 yrs old     OB History   None      Home Medications    Prior to Admission medications   Medication Sig Start Date End Date Taking? Authorizing Provider  albuterol (PROVENTIL HFA;VENTOLIN HFA) 108 (90 Base) MCG/ACT inhaler Inhale 2 puffs into the lungs every 6 (six) hours as needed for wheezing. 04/18/16   [name], MD  beclomethasone (QVAR) 80 MCG/ACT inhaler Inhale 1 puff into the lungs daily. Patient taking differently: Inhale 1 puff into the lungs daily as needed (for shortness of breath or wheezing).  04/18/16   [name], MD  Cholecalciferol (VITAMIN D3) 5000 units CAPS Take 5,000 Units by mouth daily.     [provider]  ciprofloxacin (CIPRO) 500 MG tablet Take 1 tablet (500 mg total) by mouth 2 (two) times daily. 11/11/18   Orma Flaming, MD  lansoprazole (PREVACID) 30 MG capsule Take 1 capsule (30 mg total) by mouth daily before breakfast. 02/21/18   Rehman, Mechele Dawley, MD  LORazepam (ATIVAN) 1 MG tablet TAKE 1/2 TO 1 TABLET BY MOUTH EVERY 8 HOURS AS NEEDED FOR ANXIETY 10/24/18   [name], MD  metoprolol succinate (TOPROL XL) 100 MG 24 hr tablet Take 1 tablet (100 mg total) by mouth 2 (two) times daily. Take with or immediately following a meal. 12/26/17   Herminio Commons, MD  metroNIDAZOLE (FLAGYL) 500 MG tablet Take 1 tablet (500 mg total) by mouth 3 (three) times daily. 11/11/18   Orma Flaming, MD  Omega-3 Fatty Acids (FISH OIL) 1000 MG CAPS Take 2,000 mg by mouth 3 (three) times a week.     [provider]  ranitidine (ZANTAC) 300 MG tablet TAKE 1 TABLET BY MOUTH AT BEDTIME AS NEEDED FOR HEARTBURN 06/20/18   [name], MD  temazepam (RESTORIL) 30 MG capsule TAKE 1 CAPSULE BY MOUTH ONCE AT BEDTIME 06/20/18   [name], MD  traZODone (DESYREL) 150 MG tablet TAKE 1 TABLET (150 MG TOTAL) BY MOUTH AT BEDTIME. 05/13/18   [name], MD  TRINTELLIX 10 MG TABS tablet TAKE 1 TABLET BY MOUTH ONCE DAILY 10/24/18    [name], MD    Family History Family History  Problem Relation Age of Onset  . Hypertension Mother   . Hypertension Father   . Hypertension Sister   . Diabetes Sister        type 2  . Fibromyalgia Sister   . GI Bleed Maternal Grandmother   . Atrial fibrillation Maternal Grandmother   . Heart disease Maternal Grandfather   . Diabetes Paternal Grandmother   . Stroke Paternal Grandfather   . Proteinuria Sister     Social History Social History   Tobacco Use  . Smoking status: Never Smoker  . Smokeless tobacco: Never Used  Substance Use Topics  . Alcohol use: No    Alcohol/week: 0.0 standard drinks  . Drug use: No     Allergies   Dilaudid [  hydromorphone hcl]   Review of Systems Review of Systems  Constitutional: Positive for chills. Negative for fever.  Respiratory: Negative for shortness of breath.   Cardiovascular: Negative for chest pain.  Gastrointestinal: Positive for abdominal pain, constipation and nausea. Negative for anal bleeding, blood in stool, diarrhea and vomiting.  Genitourinary: Negative for dysuria, hematuria, vaginal bleeding and vaginal discharge.  Neurological: Negative for syncope.  All other systems reviewed and are negative.    Physical Exam Updated Vital Signs BP 136/88   Pulse (!) 114   Temp 98.4 F (36.9 C)   Resp 20   LMP  (LMP Unknown)   SpO2 98%   Physical Exam  Constitutional: She appears well-developed and well-nourished.  Non-toxic appearance. No distress.  HENT:  Head: Normocephalic and atraumatic.  Eyes: Conjunctivae are normal. Right eye exhibits no discharge. Left eye exhibits no discharge.  Neck: Neck supple.  Cardiovascular: Normal rate and regular rhythm.  No murmur heard. Pulmonary/Chest: Effort normal and breath sounds normal. No respiratory distress. She has no wheezes. She has no rhonchi. She has no rales.  Respiration even and unlabored  Abdominal: Soft. She exhibits no distension. There is tenderness  in the left lower quadrant. There is no rigidity, no rebound and no CVA tenderness.  Neurological: She is alert.  Clear speech.   Skin: Skin is warm and dry. No rash noted.  Psychiatric: She has a normal mood and affect. Her behavior is normal.  Nursing note and vitals reviewed.  ED Treatments / Results  Labs (all labs ordered are listed, but only abnormal results are displayed) Labs Reviewed  HEPATIC FUNCTION PANEL - Abnormal; Notable for the following components:      Result Value   Indirect Bilirubin 1.0 (*)    All other components within normal limits  LIPASE, BLOOD    EKG None  Radiology Ct Abdomen Pelvis W Contrast  Result Date: 11/11/2018 CLINICAL DATA:  62 year old female with severe left lower quadrant abdominal pain and nausea since yesterday. EXAM: CT ABDOMEN AND PELVIS WITH CONTRAST TECHNIQUE: Multidetector CT imaging of the abdomen and pelvis was performed using the standard protocol following bolus administration of intravenous contrast. CONTRAST:  124mL ISOVUE-300 IOPAMIDOL (ISOVUE-300) INJECTION 61% COMPARISON:  Chest CTA 01/30/2018. FINDINGS: Lower chest: No cardiomegaly. Small gastric hiatal hernia. Negative lung bases. No pleural or pericardial effusion. Hepatobiliary: Surgically absent gallbladder. Negative liver. Pancreas: Negative. Spleen: Negative. Adrenals/Urinary Tract: Normal adrenal glands. Bilateral renal enhancement and contrast excretion is symmetric and normal. The left ureter passes through an area of mild left retroperitoneal stranding but otherwise appears normal. Diminutive and unremarkable urinary bladder. Stomach/Bowel: Negative rectum and distal sigmoid. Diverticulosis in the sigmoid colon with 5-6 centimeter segment of inflamed proximal sigmoid with circumferential wall thickening, confluent mesenteric stranding and hyperenhancing ventral diverticulum. See series 2, image 63 and coronal image 39. No extraluminal gas. Trace regional free fluid. No fluid  collection. No dilated upstream bowel. Mild diverticulosis in the descending colon. Negative right colon and terminal ileum. The appendix is not identified. There is trace free fluid in distal small bowel mesentery. No dilated or abnormal small bowel loops. Negative intraabdominal stomach, duodenum. No free air. No upper abdominal free fluid. Vascular/Lymphatic: Major arterial structures are patent with minor atherosclerosis. Portal venous system is patent. No lymphadenopathy. Reproductive: Surgically absent uterus, diminutive or absent ovaries. Other: Small volume of simple appearing pelvic free fluid greater on the left (series 2, image 65). Musculoskeletal: No acute osseous abnormality identified. Right greater than left degenerative lumbar  facet hypertrophy. IMPRESSION: Findings most compatible with Acute Diverticulitis of the proximal sigmoid colon colon, however, follow-up is recommended to exclude neoplasm which can occasionally have a similar appearance and presentation. Small volume of free fluid in the lower abdomen and pelvis. No abscess or drainable fluid collection. No bowel obstruction or other complicating features. Electronically Signed   By: Genevie Ann M.D.   On: 11/11/2018 17:41    Procedures Procedures (including critical care time)  Medications Ordered in ED Medications  iopamidol (ISOVUE-300) 61 % injection (has no administration in time range)  sodium chloride (PF) 0.9 % injection (has no administration in time range)  ondansetron (ZOFRAN) injection 4 mg (4 mg Intravenous Given 11/11/18 1641)  sodium chloride 0.9 % bolus 1,000 mL (0 mLs Intravenous Stopped 11/11/18 1742)  iopamidol (ISOVUE-300) 61 % injection 100 mL (100 mLs Intravenous Contrast Given 11/11/18 1707)     Initial Impression / Assessment and Plan / ED Course  I have reviewed the triage vital signs and the nursing notes.  Pertinent labs & imaging results that were available during my care of the patient were reviewed  by me and considered in my medical decision making (see chart for details).  Patient presents to the ED with complaints of abdominal pain. Patient nontoxic appearing, in no apparent distress, initial tachycardia normalized on my exam and w/ repeat vitals, otherwise vitals WNL. On exam patient tender to LLQ. Labs from PCP reviewed, additional hepatic function panel and lipase ordered. Patient declined analgesics, anti-emetics and fluids ordered.   Labs reviewed from PCP visit and in the ER. Leukocytosis at 12.4 w/ left shift. no anemia, no significant electrolyte derangements. LFTs, renal function, and lipase WNL. Urinalysis without obvious infection.   Imaging w/ acute diverticulitis without perforation/abscess/obstruction. Radiology report recommends follow-up is recommended to exclude neoplasm which can occasionally have a similar appearance and presentation.  Patient tolerating PO. Has prescriptions for flagyl and cipro from PCP- continue abx as prescribed. Offered anti-emetics & analgesics, patient declined prescriptions. She appears safe for discharge at this time. I discussed results, treatment plan, need for PCP follow-up, and return precautions with the patient. Provided opportunity for questions, patient confirmed understanding and is in agreement with plan.      Vitals:   11/11/18 1830 11/11/18 1841  BP: 128/65   Pulse: 85   Resp:  20  Temp:  98.1 F (36.7 C)  SpO2: 97%     Final Clinical Impressions(s) / ED Diagnoses   Final diagnoses:  Diverticulitis    ED Discharge Orders    None       Amaryllis Dyke, PA-C 11/11/18 1910    Duffy Bruce, MD 11/12/18 1501

## 2018-11-12 ENCOUNTER — Ambulatory Visit (HOSPITAL_BASED_OUTPATIENT_CLINIC_OR_DEPARTMENT_OTHER): Payer: 59

## 2018-11-14 NOTE — Telephone Encounter (Signed)
Please advise 

## 2018-11-15 NOTE — Telephone Encounter (Signed)
So this is from 4 days ago so we need to check and see how she is doing. If she is worse or even the same she needs to be seen somewhere. She also needs to be seen by GI most likely given a change in bowel habits, can helpf facilitate this if she is stable.

## 2018-11-18 ENCOUNTER — Other Ambulatory Visit: Payer: Self-pay | Admitting: Family Medicine

## 2018-11-18 DIAGNOSIS — K219 Gastro-esophageal reflux disease without esophagitis: Secondary | ICD-10-CM

## 2018-11-18 DIAGNOSIS — R194 Change in bowel habit: Secondary | ICD-10-CM

## 2018-11-18 NOTE — Telephone Encounter (Signed)
Referral placed.

## 2018-11-18 NOTE — Addendum Note (Signed)
Addended by: Raynelle Dick R on: 11/18/2018 01:10 PM   Modules accepted: Orders

## 2018-11-18 NOTE — Telephone Encounter (Signed)
Author phoned pt. to check on status. Pt. stated she has been taking the cipro off/on and took flagyl for only 7 days because it made her sick to her stomach. Pt c/o reflux after every meal, compliant with zantac, and states she continues to have frequent diarrhea. Pt. Is currently eating scrambled eggs and chicken noodle soup with difficulty still. Diet recommendations made.  Pt. would like to return to [facility] [place] to assess reported possible neoplasm, requesting referral. Referral order left pended for Dr. Charlett Blake review. F/U appointment with Dr. Charlett Blake made for 1/14 at [facility], pt. Appreciative.

## 2018-11-25 ENCOUNTER — Other Ambulatory Visit: Payer: Self-pay | Admitting: Family Medicine

## 2018-11-25 MED FILL — LORazepam 1 MG TABS: 1 | 23 days supply | Qty: 70 | Fill #1

## 2018-11-25 MED FILL — traZODone HCL 150 MG TABS: 150 | 90 days supply | Qty: 90 | Fill #0

## 2018-11-25 NOTE — Telephone Encounter (Signed)
Requesting:restoril  Contract:yes UDS:no [employer] employee Last OV:08/27/18 Next OV:12/17/18 Last Refill:06/10/18  #[id number] Database:   Please advise

## 2018-11-26 MED ORDER — TEMAZEPAM 30 MG PO CAPS: ORAL_CAPSULE | ORAL | 3 refills | Status: DC

## 2018-11-26 MED FILL — TEMAZEPAM 30 MG CAPSULE: 30 | 30 days supply | Qty: 30 | Fill #0

## 2018-11-28 MED FILL — TRINTELLIX 10 MG TABLET: 10 | 30 days supply | Qty: 30 | Fill #1

## 2018-12-13 NOTE — Addendum Note (Signed)
Addended by: MELLEN, EMILY R on: 12/13/2018 10:26 AM   Modules accepted: Orders  

## 2018-12-17 ENCOUNTER — Ambulatory Visit: Payer: 59 | Admitting: Family Medicine

## 2018-12-17 ENCOUNTER — Encounter: Payer: Self-pay | Admitting: Family Medicine

## 2018-12-17 VITALS — BP 120/68 | HR 80 | Temp 98.1°F | Resp 16 | Ht 64.0 in | Wt 173.4 lb

## 2018-12-17 DIAGNOSIS — K5792 Diverticulitis of intestine, part unspecified, without perforation or abscess without bleeding: Secondary | ICD-10-CM | POA: Diagnosis not present

## 2018-12-17 DIAGNOSIS — E6609 Other obesity due to excess calories: Secondary | ICD-10-CM

## 2018-12-17 DIAGNOSIS — R000 Tachycardia, unspecified: Secondary | ICD-10-CM | POA: Diagnosis not present

## 2018-12-17 DIAGNOSIS — R109 Unspecified abdominal pain: Secondary | ICD-10-CM | POA: Diagnosis not present

## 2018-12-17 DIAGNOSIS — F418 Other specified anxiety disorders: Secondary | ICD-10-CM

## 2018-12-17 DIAGNOSIS — E559 Vitamin D deficiency, unspecified: Secondary | ICD-10-CM

## 2018-12-17 DIAGNOSIS — K219 Gastro-esophageal reflux disease without esophagitis: Secondary | ICD-10-CM | POA: Diagnosis not present

## 2018-12-17 MED ORDER — CIPROFLOXACIN HCL 500 MG PO TABS: 500.0000 mg | ORAL_TABLET | Freq: Two times a day (BID) | ORAL | 0 refills | Status: DC

## 2018-12-17 MED ORDER — METRONIDAZOLE 500 MG PO TABS: 500.0000 mg | ORAL_TABLET | Freq: Three times a day (TID) | ORAL | 0 refills | Status: DC

## 2018-12-17 MED ORDER — FAMOTIDINE 40 MG PO TABS: 40.0000 mg | ORAL_TABLET | Freq: Every day | ORAL | 1 refills | Status: DC

## 2018-12-17 NOTE — Patient Instructions (Signed)
Diverticulitis  Diverticulitis is infection or inflammation of small pouches (diverticula) in the colon that form due to a condition called diverticulosis. Diverticula can trap stool (feces) and bacteria, causing infection and inflammation. Diverticulitis may cause severe stomach pain and diarrhea. It may lead to tissue damage in the colon that causes bleeding. The diverticula may also burst (rupture) and cause infected stool to enter other areas of the abdomen. Complications of diverticulitis can include:  Bleeding.  Severe infection.  Severe pain.  Rupture (perforation) of the colon.  Blockage (obstruction) of the colon. What are the causes? This condition is caused by stool becoming trapped in the diverticula, which allows bacteria to grow in the diverticula. This leads to inflammation and infection. What increases the risk? You are more likely to develop this condition if:  You have diverticulosis. The risk for diverticulosis increases if: ? You are overweight or obese. ? You use tobacco products. ? You do not get enough exercise.  You eat a diet that does not include enough fiber. High-fiber foods include fruits, vegetables, beans, nuts, and whole grains. What are the signs or symptoms? Symptoms of this condition may include:  Pain and tenderness in the abdomen. The pain is normally located on the left side of the abdomen, but it may occur in other areas.  Fever and chills.  Bloating.  Cramping.  Nausea.  Vomiting.  Changes in bowel routines.  Blood in your stool. How is this diagnosed? This condition is diagnosed based on:  Your medical history.  A physical exam.  Tests to make sure there is nothing else causing your condition. These tests may include: ? Blood tests. ? Urine tests. ? Imaging tests of the abdomen, including X-rays, ultrasounds, MRIs, or CT scans. How is this treated? Most cases of this condition are mild and can be treated at home.  Treatment may include:  Taking over-the-counter pain medicines.  Following a clear liquid diet.  Taking antibiotic medicines by mouth.  Rest. More severe cases may need to be treated at a hospital. Treatment may include:  Not eating or drinking.  Taking prescription pain medicine.  Receiving antibiotic medicines through an IV tube.  Receiving fluids and nutrition through an IV tube.  Surgery. When your condition is under control, your health care provider may recommend that you have a colonoscopy. This is an exam to look at the entire large intestine. During the exam, a lubricated, bendable tube is inserted into the anus and then passed into the rectum, colon, and other parts of the large intestine. A colonoscopy can show how severe your diverticula are and whether something else may be causing your symptoms. Follow these instructions at home: Medicines  Take over-the-counter and prescription medicines only as told by your health care provider. These include fiber supplements, probiotics, and stool softeners.  If you were prescribed an antibiotic medicine, take it as told by your health care provider. Do not stop taking the antibiotic even if you start to feel better.  Do not drive or use heavy machinery while taking prescription pain medicine. General instructions   Follow a full liquid diet or another diet as directed by your health care provider. After your symptoms improve, your health care provider may tell you to change your diet. He or she may recommend that you eat a diet that contains at least 25 g (25 grams) of fiber daily. Fiber makes it easier to pass stool. Healthy sources of fiber include: ? Berries. One cup contains 4-8 grams of   fiber. ? Beans or lentils. One half cup contains 5-8 grams of fiber. ? Green vegetables. One cup contains 4 grams of fiber.  Exercise for at least 30 minutes, 3 times each week. You should exercise hard enough to raise your heart rate and  break a sweat.  Keep all follow-up visits as told by your health care provider. This is important. You may need a colonoscopy. Contact a health care provider if:  Your pain does not improve.  You have a hard time drinking or eating food.  Your bowel movements do not return to normal. Get help right away if:  Your pain gets worse.  Your symptoms do not get better with treatment.  Your symptoms suddenly get worse.  You have a fever.  You vomit more than one time.  You have stools that are bloody, black, or tarry. Summary  Diverticulitis is infection or inflammation of small pouches (diverticula) in the colon that form due to a condition called diverticulosis. Diverticula can trap stool (feces) and bacteria, causing infection and inflammation.  You are at higher risk for this condition if you have diverticulosis and you eat a diet that does not include enough fiber.  Most cases of this condition are mild and can be treated at home. More severe cases may need to be treated at a hospital.  When your condition is under control, your health care provider may recommend that you have an exam called a colonoscopy. This exam can show how severe your diverticula are and whether something else may be causing your symptoms. This information is not intended to replace advice given to you by your health care provider. Make sure you discuss any questions you have with your health care provider. Document Released: 08/30/2005 Document Revised: 12/23/2016 Document Reviewed: 12/23/2016 Elsevier Interactive Patient Education  2019 [employer]  

## 2018-12-18 MED FILL — FAMOTIDINE 40 MG TABLET: 40 | 30 days supply | Qty: 30 | Fill #0

## 2018-12-22 ENCOUNTER — Other Ambulatory Visit: Payer: Self-pay | Admitting: Family Medicine

## 2018-12-22 DIAGNOSIS — K219 Gastro-esophageal reflux disease without esophagitis: Secondary | ICD-10-CM

## 2018-12-23 DIAGNOSIS — K5792 Diverticulitis of intestine, part unspecified, without perforation or abscess without bleeding: Secondary | ICD-10-CM | POA: Insufficient documentation

## 2018-12-23 MED FILL — TRINTELLIX 10 MG TABLET: 10 | 30 days supply | Qty: 30 | Fill #2

## 2018-12-23 NOTE — Assessment & Plan Note (Signed)
Recently treated with cipro and flagyl and improved some. Symptoms milder but still present is given a refill on meds to hold onto and use if symptoms worsen. Encouraged probiotics.

## 2018-12-23 NOTE — Assessment & Plan Note (Signed)
Encouraged DASH diet, decrease po intake and increase exercise as tolerated. Needs 7-8 hours of sleep nightly. Avoid trans fats, eat small, frequent meals every 4-5 hours with lean proteins, complex carbs and healthy fats. Minimize simple carbs 

## 2018-12-23 NOTE — Assessment & Plan Note (Signed)
Continues to struggle on a daily basis but manages adequately most days. Consider consultation with psychiatry

## 2018-12-23 NOTE — Assessment & Plan Note (Signed)
RRR today 

## 2018-12-23 NOTE — Progress Notes (Signed)
Subjective:    Patient ID: [name] Jensen, female    DOB: Nov 23, 1956, 63 y.o.   MRN: [id number]  Chief Complaint  Patient presents with  . Diverticulitis    HPI Patient is in today for follow-up after being treated for diverticulitis.  She presented in December with an episode of severe left lower quadrant pain and was found to have diverticulitis.  Was given a course of ciprofloxacin and Flagyl and her symptoms improved greatly.  Her symptoms now are still noted.  She does have some intermittent abdominal pain and anorexia.  No fevers or chills.  No bloody or tarry stool.  She is frustrated about her current state of poor health and her difficulty with weight management as well.  Her depression and anhedonia are still noted but no suicidal ideation is endorsed. Denies CP/palp/SOB/HA/congestion/fevers or GU c/o. Taking meds as prescribed  Past Medical History:  Diagnosis Date  . Adjustment reaction with anxiety and depression 06/12/2014  . Anemia    h/o low ferritin  . Anxiety   . Asthma    environmental triggers  . Chicken pox as a child  . Cough 08/22/2015  . Depression   . Depression with anxiety 06/12/2014  . Diverticulosis   . Ehrlichiosis 8115  . Gallstones   . GERD (gastroesophageal reflux disease)   . Hair loss 03/28/2017  . Hiatal hernia 2007  . History of viral illness   . Hyperlipidemia, mixed 04/11/2015  . Insomnia 08/21/2017  . Lactose intolerance 06/12/2014  . Mumps as a child  . Overweight 08/19/2014  . Rectocele 06/12/2014  . RLS (restless legs syndrome)   . 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 Sessile colonic polyp   . SVT (supraventricular tachycardia) ([place])   . Tachycardia 06/12/2014  . Thyroid disease   . Vitamin D deficiency 08/22/2015    Past Surgical History:  Procedure Laterality Date  . ABDOMINAL HYSTERECTOMY  11-2008   total with cystocele, rectocele repair  . APPENDECTOMY    . CHOLECYSTECTOMY    . COLONOSCOPY   2006   diverticulosis, hemorrhoids ([facility])  . ESOPHAGOGASTRODUODENOSCOPY  2006   small hiatus hernia ([facility])  . ESOPHAGOGASTRODUODENOSCOPY N/A 02/21/2018   Procedure: ESOPHAGOGASTRODUODENOSCOPY (EGD);  Surgeon: Rogene Houston, MD;  Location: [facility];  Service: Endoscopy;  Laterality: N/A;  pt knows to arrive at 2:45  . INCONTINENCE SURGERY    . SKIN SURGERY     right arm scc  . svt ablation    . TONSILLECTOMY  1972   2ith adenoid  . WISDOM TOOTH EXTRACTION  63 yrs old    Family History  Problem Relation Age of Onset  . Hypertension Mother   . Hypertension Father   . Hypertension Sister   . Diabetes Sister        type 2  . Fibromyalgia Sister   . GI Bleed Maternal Grandmother   . Atrial fibrillation Maternal Grandmother   . Heart disease Maternal Grandfather   . Diabetes Paternal Grandmother   . Stroke Paternal Grandfather   . Proteinuria Sister     Social History   Socioeconomic History  . Marital status: Divorced    Spouse name: Not on file  . Number of children: 2  . Years of education: Not on file  . Highest education level: Not on file  Occupational History  . Occupation: Programmer, multimedia: [employer]  . Financial resource strain: Not on  file  . Food insecurity:    Worry: Not on file    Inability: Not on file  . Transportation needs:    Medical: Not on file    Non-medical: Not on file  Tobacco Use  . Smoking status: Never Smoker  . Smokeless tobacco: Never Used  Substance and Sexual Activity  . Alcohol use: No    Alcohol/week: 0.0 standard drinks  . Drug use: No  . Sexual activity: Not on file    Comment: lives by self with 4 dogs works at [employer], avoids dairy  Lifestyle  . Physical activity:    Days per week: Not on file    Minutes per session: Not on file  . Stress: Not on file  Relationships  . Social connections:    Talks on phone: Not on file    Gets together: Not on file    Attends religious service: Not on file     Active member of club or organization: Not on file    Attends meetings of clubs or organizations: Not on file    Relationship status: Not on file  . Intimate partner violence:    Fear of current or ex partner: Not on file    Emotionally abused: Not on file    Physically abused: Not on file    Forced sexual activity: Not on file  Other Topics Concern  . Not on file  Social History Narrative  . Not on file    Outpatient Medications Prior to Visit  Medication Sig Dispense Refill  . Cholecalciferol (VITAMIN D3) 5000 units CAPS Take 5,000 Units by mouth daily.     Marland Kitchen LORazepam (ATIVAN) 1 MG tablet TAKE 1/2 TO 1 TABLET BY MOUTH EVERY 8 HOURS AS NEEDED FOR ANXIETY 70 tablet 3  . metoprolol succinate (TOPROL XL) 100 MG 24 hr tablet Take 1 tablet (100 mg total) by mouth 2 (two) times daily. Take with or immediately following a meal. 180 tablet 3  . Omega-3 Fatty Acids (FISH OIL PO) Take 2,000 mg by mouth 2 (two) times daily.    . temazepam (RESTORIL) 30 MG capsule Take 1 capsule po at bedtime 30 capsule 3  . traZODone (DESYREL) 150 MG tablet TAKE 1 TABLET (150 MG TOTAL) BY MOUTH AT BEDTIME. 90 tablet 1  . vortioxetine HBr (TRINTELLIX) 10 MG TABS tablet Take 10 mg by mouth daily.    . ranitidine (ZANTAC) 300 MG tablet TAKE 1 TABLET BY MOUTH AT BEDTIME AS NEEDED FOR HEARTBURN 90 tablet 3  . ciprofloxacin (CIPRO) 500 MG tablet Take 1 tablet (500 mg total) by mouth 2 (two) times daily. 20 tablet 0  . metroNIDAZOLE (FLAGYL) 500 MG tablet Take 1 tablet (500 mg total) by mouth 3 (three) times daily. 30 tablet 0  . Omega-3 Fatty Acids (FISH OIL) 1000 MG CAPS Take 2,000 mg by mouth 3 (three) times a week.      No facility-administered medications prior to visit.     Allergies  Allergen Reactions  . Dilaudid [Hydromorphone Hcl] Other (See Comments)    Respiratory , cardiac     Review of Systems  Constitutional: Negative for fever and malaise/fatigue.  HENT: Negative for congestion.   Eyes:  Negative for blurred vision.  Respiratory: Negative for shortness of breath.   Cardiovascular: Negative for chest pain, palpitations and leg swelling.  Gastrointestinal: Positive for abdominal pain and heartburn. Negative for blood in stool, melena and nausea.  Genitourinary: Negative for dysuria and frequency.  Musculoskeletal: Positive  for myalgias. Negative for falls.  Skin: Negative for rash.  Neurological: Negative for dizziness, loss of consciousness and headaches.  Endo/Heme/Allergies: Negative for environmental allergies.  Psychiatric/Behavioral: Positive for depression. Negative for hallucinations, substance abuse and suicidal ideas. The patient is nervous/anxious and has insomnia.        Objective:    Physical Exam Vitals signs and nursing note reviewed.  Constitutional:      General: She is not in acute distress.    Appearance: She is well-developed.  HENT:     Head: Normocephalic and atraumatic.     Nose: Nose normal.  Eyes:     General:        Right eye: No discharge.        Left eye: No discharge.  Neck:     Musculoskeletal: Normal range of motion and neck supple.  Cardiovascular:     Rate and Rhythm: Normal rate and regular rhythm.     Heart sounds: No murmur.  Pulmonary:     Effort: Pulmonary effort is normal.     Breath sounds: Normal breath sounds.  Abdominal:     General: Bowel sounds are normal.     Palpations: Abdomen is soft.     Tenderness: There is no abdominal tenderness.  Skin:    General: Skin is warm and dry.  Neurological:     Mental Status: She is alert and oriented to person, place, and time.     BP 120/68 (BP Location: Right Arm, Cuff Size: Normal)   Pulse 80   Temp 98.1 F (36.7 C) (Oral)   Resp 16   Ht 5\' 4"  (1.626 m)   Wt 173 lb 6.4 oz (78.7 kg)   LMP  (LMP Unknown)   SpO2 97%   BMI 29.76 kg/m  Wt Readings from Last 3 Encounters:  12/17/18 173 lb 6.4 oz (78.7 kg)  11/11/18 167 lb 3.2 oz (75.8 kg)  08/27/18 178 lb 3.2 oz  (80.8 kg)     Lab Results  Component Value Date   WBC 12.4 (H) 11/11/2018   HGB 14.0 11/11/2018   HCT 41.1 11/11/2018   PLT 302.0 11/11/2018   GLUCOSE 112 (H) 11/11/2018   CHOL 196 10/22/2018   TRIG 149.0 10/22/2018   HDL 47.90 10/22/2018   LDLDIRECT 91.0 09/25/2018   LDLCALC 118 (H) 10/22/2018   ALT 27 11/11/2018   AST 26 11/11/2018   NA 136 11/11/2018   K 4.1 11/11/2018   CL 101 11/11/2018   CREATININE 0.79 11/11/2018   BUN 12 11/11/2018   CO2 25 11/11/2018   TSH 1.20 09/25/2018   INR 1.0 11/09/2008   HGBA1C 5.9 09/25/2018    Lab Results  Component Value Date   TSH 1.20 09/25/2018   Lab Results  Component Value Date   WBC 12.4 (H) 11/11/2018   HGB 14.0 11/11/2018   HCT 41.1 11/11/2018   MCV 83.3 11/11/2018   PLT 302.0 11/11/2018   Lab Results  Component Value Date   NA 136 11/11/2018   K 4.1 11/11/2018   CO2 25 11/11/2018   GLUCOSE 112 (H) 11/11/2018   BUN 12 11/11/2018   CREATININE 0.79 11/11/2018   BILITOT 1.1 11/11/2018   ALKPHOS 74 11/11/2018   AST 26 11/11/2018   ALT 27 11/11/2018   PROT 7.3 11/11/2018   ALBUMIN 4.0 11/11/2018   CALCIUM 9.8 11/11/2018   ANIONGAP 12 01/24/2018   GFR 78.82 11/11/2018   Lab Results  Component Value Date   CHOL 196  10/22/2018   Lab Results  Component Value Date   HDL 47.90 10/22/2018   Lab Results  Component Value Date   LDLCALC 118 (H) 10/22/2018   Lab Results  Component Value Date   TRIG 149.0 10/22/2018   Lab Results  Component Value Date   CHOLHDL 4 10/22/2018   Lab Results  Component Value Date   HGBA1C 5.9 09/25/2018       Assessment & Plan:   Problem List Items Addressed This Visit    Tachycardia    RRR today      Depression with anxiety    Continues to struggle on a daily basis but manages adequately most days. Consider consultation with psychiatry      Relevant Medications   vortioxetine HBr (TRINTELLIX) 10 MG TABS tablet   GERD (gastroesophageal reflux disease)    Avoid  offending foods, start probiotics. Do not eat large meals in late evening and consider raising head of bed.       Relevant Medications   famotidine (PEPCID) 40 MG tablet   Obesity    Encouraged DASH diet, decrease po intake and increase exercise as tolerated. Needs 7-8 hours of sleep nightly. Avoid trans fats, eat small, frequent meals every 4-5 hours with lean proteins, complex carbs and healthy fats. Minimize simple carbs      Abdominal pain    Recently treated for Diverticulitis and improved but pain is still present. Will proceed with imaging and encouraged bland diet, hydration and seek care if worsens.       Relevant Orders   CT Abdomen Pelvis W Contrast   Ambulatory referral to Gastroenterology   Vitamin D deficiency   Diverticulitis - Primary    Recently treated with cipro and flagyl and improved some. Symptoms milder but still present is given a refill on meds to hold onto and use if symptoms worsen. Encouraged probiotics.      Relevant Orders   CT Abdomen Pelvis W Contrast   Ambulatory referral to Gastroenterology      I have discontinued [name] Jensen's Fish Oil, ranitidine, ciprofloxacin, and metroNIDAZOLE. I am also having her start on ciprofloxacin, metroNIDAZOLE, and famotidine. Additionally, I am having her maintain her Vitamin D3, metoprolol succinate, LORazepam, traZODone, temazepam, Omega-3 Fatty Acids (FISH OIL PO), and vortioxetine HBr.  Meds ordered this encounter  Medications  . ciprofloxacin (CIPRO) 500 MG tablet    Sig: Take 1 tablet (500 mg total) by mouth 2 (two) times daily.    Dispense:  20 tablet    Refill:  0  . metroNIDAZOLE (FLAGYL) 500 MG tablet    Sig: Take 1 tablet (500 mg total) by mouth 3 (three) times daily.    Dispense:  21 tablet    Refill:  0  . famotidine (PEPCID) 40 MG tablet    Sig: Take 1 tablet (40 mg total) by mouth daily.    Dispense:  90 tablet    Refill:  1     Penni Homans, MD

## 2018-12-23 NOTE — Assessment & Plan Note (Signed)
Recently treated for Diverticulitis and improved but pain is still present. Will proceed with imaging and encouraged bland diet, hydration and seek care if worsens.

## 2018-12-23 NOTE — Assessment & Plan Note (Signed)
Avoid offending foods, start probiotics. Do not eat large meals in late evening and consider raising head of bed.  

## 2019-01-01 MED FILL — LORazepam 1 MG TABS: 1 | 23 days supply | Qty: 70 | Fill #2

## 2019-01-01 MED FILL — TEMAZEPAM 30 MG CAPSULE: 30 | 30 days supply | Qty: 30 | Fill #1

## 2019-01-07 ENCOUNTER — Telehealth: Payer: Self-pay | Admitting: Gastroenterology

## 2019-01-07 NOTE — Telephone Encounter (Signed)
Good Morning Dr. Fuller Plan,   Patient record for review was sent to you for a upper endoscopy that was done 2019 (aslo found in epic/ procedures). Pt was referred from PCP for the ENDO but would like to come back with you. Let me know if I can schedule her.

## 2019-01-07 NOTE — Telephone Encounter (Signed)
OK to schedule for office appt with me.

## 2019-01-28 ENCOUNTER — Other Ambulatory Visit: Payer: Self-pay

## 2019-01-28 DIAGNOSIS — R0602 Shortness of breath: Secondary | ICD-10-CM

## 2019-01-29 ENCOUNTER — Encounter (HOSPITAL_BASED_OUTPATIENT_CLINIC_OR_DEPARTMENT_OTHER): Payer: Self-pay

## 2019-01-29 ENCOUNTER — Ambulatory Visit (HOSPITAL_BASED_OUTPATIENT_CLINIC_OR_DEPARTMENT_OTHER)
Admission: RE | Admit: 2019-01-29 | Discharge: 2019-01-29 | Disposition: A | Discharge status: Home / Self Care | Payer: 59 | Source: Ambulatory Visit | Attending: Family Medicine | Admitting: Family Medicine

## 2019-01-29 ENCOUNTER — Ambulatory Visit: Payer: 59 | Admitting: Internal Medicine

## 2019-01-29 ENCOUNTER — Other Ambulatory Visit (HOSPITAL_COMMUNITY)
Admission: RE | Admit: 2019-01-29 | Discharge: 2019-01-29 | Disposition: A | Discharge status: Home / Self Care | Payer: 59 | Source: Ambulatory Visit | Attending: Cardiovascular Disease | Admitting: Cardiovascular Disease

## 2019-01-29 DIAGNOSIS — K5792 Diverticulitis of intestine, part unspecified, without perforation or abscess without bleeding: Secondary | ICD-10-CM | POA: Insufficient documentation

## 2019-01-29 DIAGNOSIS — K5732 Diverticulitis of large intestine without perforation or abscess without bleeding: Secondary | ICD-10-CM | POA: Diagnosis not present

## 2019-01-29 DIAGNOSIS — R109 Unspecified abdominal pain: Secondary | ICD-10-CM | POA: Insufficient documentation

## 2019-01-29 LAB — BASIC METABOLIC PANEL
Anion gap: 10 (ref 5–15)
BUN: 13 mg/dL (ref 6–20)
CO2: 24 mmol/L (ref 22–32)
Calcium: 9.2 mg/dL (ref 8.9–10.3)
Chloride: 106 mmol/L (ref 98–111)
Creatinine, Ser: 0.73 mg/dL (ref 0.44–1.00)
Glucose, Bld: 131 mg/dL — ABNORMAL HIGH (ref 70–99)
Potassium: 4 mmol/L (ref 3.5–5.1)
Sodium: 140 mmol/L (ref 135–145)

## 2019-01-29 MED ORDER — IOPAMIDOL (ISOVUE-300) INJECTION 61%
100.0000 mL | Freq: Once | INTRAVENOUS | Status: AC | PRN
  Administered 2019-01-29: 100 mL via INTRAVENOUS

## 2019-01-29 MED FILL — CIPROFLOXACIN HCL 500 MG TA: 500 | 10 days supply | Qty: 20 | Fill #0

## 2019-01-29 MED FILL — metroNIDAZOLE 500 MG TABS: 500 | 7 days supply | Qty: 21 | Fill #0

## 2019-02-03 MED FILL — TEMAZEPAM 30 MG CAPSULE: 30 | 30 days supply | Qty: 30 | Fill #2 | Status: TO

## 2019-02-03 MED FILL — LORazepam 1 MG TABS: 1 | 23 days supply | Qty: 70 | Fill #3

## 2019-02-05 ENCOUNTER — Encounter: Payer: Self-pay | Admitting: Gastroenterology

## 2019-02-05 ENCOUNTER — Ambulatory Visit: Payer: 59 | Admitting: Gastroenterology

## 2019-02-05 VITALS — BP 140/82 | HR 96 | Ht 64.0 in | Wt 173.5 lb

## 2019-02-05 DIAGNOSIS — R933 Abnormal findings on diagnostic imaging of other parts of digestive tract: Secondary | ICD-10-CM

## 2019-02-05 DIAGNOSIS — K5732 Diverticulitis of large intestine without perforation or abscess without bleeding: Secondary | ICD-10-CM | POA: Diagnosis not present

## 2019-02-05 DIAGNOSIS — Z86010 Personal history of colonic polyps: Secondary | ICD-10-CM

## 2019-02-05 DIAGNOSIS — K2100 Gastro-esophageal reflux disease with esophagitis, without bleeding: Secondary | ICD-10-CM

## 2019-02-05 DIAGNOSIS — R1032 Left lower quadrant pain: Secondary | ICD-10-CM

## 2019-02-05 MED ORDER — DICYCLOMINE HCL 10 MG PO CAPS: 10.0000 mg | ORAL_CAPSULE | Freq: Three times a day (TID) | ORAL | 3 refills | Status: DC

## 2019-02-05 MED ORDER — PANTOPRAZOLE SODIUM 40 MG PO TBEC: 40.0000 mg | DELAYED_RELEASE_TABLET | Freq: Two times a day (BID) | ORAL | 3 refills | Status: DC

## 2019-02-05 MED FILL — PANTOPRAZOLE SOD DR 40 MG T: 40 | 90 days supply | Qty: 180 | Fill #0 | Status: TO

## 2019-02-05 MED FILL — DICYCLOMINE 10 MG CAPSULE: 10 | 90 days supply | Qty: 270 | Fill #0 | Status: TO

## 2019-02-05 NOTE — Patient Instructions (Signed)
We have sent the following medications to your pharmacy for you to pick up at your convenience: pantoprazole and dicyclomine.   Patient advised to avoid spicy, acidic, citrus, chocolate, mints, fruit and fruit juices.  Limit the intake of caffeine, alcohol and Soda.  Don't exercise too soon after eating.  Don't lie down within 3-4 hours of eating.  Elevate the head of your bed.  You have been scheduled for a colonoscopy. Please follow written instructions given to you at your visit today.  Please pick up your prep supplies at the pharmacy within the next 1-3 days. If you use inhalers (even only as needed), please bring them with you on the day of your procedure. Your physician has requested that you go to www.startemmi.com and enter the access code given to you at your visit today. This web site gives a general overview about your procedure. However, you should still follow specific instructions given to you by our office regarding your preparation for the procedure.  Thank you for choosing me and [facility].  Pricilla Riffle. Dagoberto Ligas., MD., [facility] Regal

## 2019-02-05 NOTE — Progress Notes (Signed)
History of Present Illness: This is a 63 year old female referred by [name], MD for the evaluation of LLQ pain, GERD, diarrhea.  She developed persistent left lower quadrant pain in December and a diagnosis of acute diverticulitis was established with CT imaging.  She was treated with a 10-day course of antibiotics and her left lower quadrant pain resolved.  She relates an ongoing problem of frequent postprandial urgent loose bowel movements with brief crampy left lower quadrant pain.  The symptoms have been present for years.  She feels they have been slightly worse since her diverticulitis.  She relates ongoing reflux symptoms with frequent nocturnal regurgitation.  She is following antireflux measures and elevates the head of her bed.  EGD performed in 02/2018 by Dr. Laural Golden showed a 2 cm hiatal hernia, LA grade A esophagitis and a reactive gastropathy.  She was treated with ranitidine which controlled her symptoms however upon switching to famotidine her symptoms are poorly controlled.  Evaluation of diarrhea last summer with stool cultures negative, O&P negative, fecal lactoferrin positive.  Prior cholecystectomy, hysterectomy, appendectomy. Denies weight loss, constipation, change in stool caliber, melena, hematochezia, nausea, vomiting, dysphagia, chest pain.  Abd/pelvic CT 12/29/2018 IMPRESSION: 1. No evidence of acute abnormalities within the abdomen or pelvis. 2. Scattered colonic diverticulosis. Near complete interval resolution of previously seen acute sigmoid diverticulitis.  Abd/pelvic CT 11/11/2018 IMPRESSION: Findings most compatible with Acute Diverticulitis of the proximal sigmoid colon colon, however, follow-up is recommended to exclude neoplasm which can occasionally have a similar appearance and presentation. Small volume of free fluid in the lower abdomen and pelvis. No abscess or drainable fluid collection. No bowel obstruction or other complicating  features.   Allergies  Allergen Reactions  . Dilaudid [Hydromorphone Hcl] Other (See Comments)    Respiratory , cardiac    Outpatient Medications Prior to Visit  Medication Sig Dispense Refill  . Cholecalciferol (VITAMIN D3) 5000 units CAPS Take 5,000 Units by mouth daily.     . famotidine (PEPCID) 40 MG tablet Take 1 tablet (40 mg total) by mouth daily. 90 tablet 1  . LORazepam (ATIVAN) 1 MG tablet TAKE 1/2 TO 1 TABLET BY MOUTH EVERY 8 HOURS AS NEEDED FOR ANXIETY 70 tablet 3  . metoprolol succinate (TOPROL XL) 100 MG 24 hr tablet Take 1 tablet (100 mg total) by mouth 2 (two) times daily. Take with or immediately following a meal. 180 tablet 3  . Omega-3 Fatty Acids (FISH OIL PO) Take 2,000 mg by mouth 2 (two) times daily.    . temazepam (RESTORIL) 30 MG capsule Take 1 capsule po at bedtime 30 capsule 3  . traZODone (DESYREL) 150 MG tablet TAKE 1 TABLET (150 MG TOTAL) BY MOUTH AT BEDTIME. 90 tablet 1  . ciprofloxacin (CIPRO) 500 MG tablet Take 500 mg by mouth 2 (two) times daily.    . metroNIDAZOLE (FLAGYL) 500 MG tablet Take 1 tablet (500 mg total) by mouth 3 (three) times daily. (Patient not taking: Reported on 02/05/2019) 21 tablet 0  . ciprofloxacin (CIPRO) 500 MG tablet Take 1 tablet (500 mg total) by mouth 2 (two) times daily. 20 tablet 0  . vortioxetine HBr (TRINTELLIX) 10 MG TABS tablet Take 10 mg by mouth daily.     No facility-administered medications prior to visit.    Past Medical History:  Diagnosis Date  . Adjustment reaction with anxiety and depression 06/12/2014  . Anemia    h/o low ferritin  . Anxiety   . Asthma  environmental triggers  . Chicken pox as a child  . Cough 08/22/2015  . Depression   . Depression with anxiety 06/12/2014  . Diverticulosis   . Ehrlichiosis 0175  . Gallstones   . GERD (gastroesophageal reflux disease)   . Hair loss 03/28/2017  . Hiatal hernia 2007  . History of viral illness   . Hyperlipidemia, mixed 04/11/2015  . Insomnia 08/21/2017   . Lactose intolerance 06/12/2014  . Mumps as a child  . Overweight 08/19/2014  . Rectocele 06/12/2014  . RLS (restless legs syndrome)   . 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 Sessile colonic polyp   . SVT (supraventricular tachycardia) ([place])   . Tachycardia 06/12/2014  . Thyroid disease   . Vitamin D deficiency 08/22/2015   Past Surgical History:  Procedure Laterality Date  . ABDOMINAL HYSTERECTOMY  11-2008   total with cystocele, rectocele repair  . APPENDECTOMY    . CHOLECYSTECTOMY    . COLONOSCOPY  2006   diverticulosis, hemorrhoids ([facility])  . ESOPHAGOGASTRODUODENOSCOPY  2006   small hiatus hernia ([facility])  . ESOPHAGOGASTRODUODENOSCOPY N/A 02/21/2018   Procedure: ESOPHAGOGASTRODUODENOSCOPY (EGD);  Surgeon: Rogene Houston, MD;  Location: [facility];  Service: Endoscopy;  Laterality: N/A;  pt knows to arrive at 2:45  . INCONTINENCE SURGERY    . SKIN SURGERY     right arm scc  . svt ablation    . TONSILLECTOMY  1972   2ith adenoid  . WISDOM TOOTH EXTRACTION  63 yrs old   Social History   Socioeconomic History  . Marital status: Divorced    Spouse name: Not on file  . Number of children: 2  . Years of education: Not on file  . Highest education level: Not on file  Occupational History  . Occupation: Programmer, multimedia: [employer]  . Financial resource strain: Not on file  . Food insecurity:    Worry: Not on file    Inability: Not on file  . Transportation needs:    Medical: Not on file    Non-medical: Not on file  Tobacco Use  . Smoking status: Never Smoker  . Smokeless tobacco: Never Used  Substance and Sexual Activity  . Alcohol use: No    Alcohol/week: 0.0 standard drinks  . Drug use: No  . Sexual activity: Not on file    Comment: lives by self with 4 dogs works at [employer], avoids dairy  Lifestyle  . Physical activity:    Days per week: Not on file    Minutes per session: Not on file  . Stress: Not on file   Relationships  . Social connections:    Talks on phone: Not on file    Gets together: Not on file    Attends religious service: Not on file    Active member of club or organization: Not on file    Attends meetings of clubs or organizations: Not on file    Relationship status: Not on file  Other Topics Concern  . Not on file  Social History Narrative  . Not on file   Family History  Problem Relation Age of Onset  . Hypertension Mother   . Hypertension Father   . Hypertension Sister   . Diabetes Sister        type 2  . Fibromyalgia Sister   . GI Bleed Maternal Grandmother   . Atrial fibrillation Maternal Grandmother   . Heart  disease Maternal Grandfather   . Diabetes Paternal Grandmother   . Stroke Paternal Grandfather   . Proteinuria Sister        Review of Systems: Pertinent positive and negative review of systems were noted in the above HPI section. All other review of systems were otherwise negative.    Physical Exam: General: Well developed, well nourished, no acute distress Head: Normocephalic and atraumatic Eyes:  sclerae anicteric, EOMI Ears: Normal auditory acuity Mouth: No deformity or lesions Neck: Supple, no masses or thyromegaly Lungs: Clear throughout to auscultation Heart: Regular rate and rhythm; no murmurs, rubs or bruits Abdomen: Soft, non tender and non distended. No masses, hepatosplenomegaly or hernias noted. Normal Bowel sounds Rectal: Deferred to colonoscopy Musculoskeletal: Symmetrical with no gross deformities  Skin: No lesions on visible extremities Pulses:  Normal pulses noted Extremities: No clubbing, cyanosis, edema or deformities noted Neurological: Alert oriented x 4, grossly nonfocal Cervical Nodes:  No significant cervical adenopathy Inguinal Nodes: No significant inguinal adenopathy Psychological:  Alert and cooperative. Normal mood and affect   Assessment and Recommendations:  1. LLQ pain associated with urgency, loose  stools/diarrhea. 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  Diarrhea evaluation in summer of 2019 unremarkable except lactoferrin positive. Avoid foods that trigger symptoms.  Begin dicyclomine 10 mg 3 times daily AC.  Trial of a daily probiotic.  If symptoms not improved consider lactose free diet and low FODMAP diet.  2. Diverticulitis, resolved. Abnormal CT of colon.  Rule out neoplasm or colitis.  Schedule colonoscopy. The risks (including bleeding, perforation, infection, missed lesions, medication reactions and possible hospitalization or surgery if complications occur), benefits, and alternatives to colonoscopy with possible biopsy and possible polypectomy were discussed with the patient and they consent to proceed.   3. GERD. History of LA Grade A esophagitis, small HH.  Discontinue famotidine and begin pantoprazole 40 mg twice daily before breakfast and dinner.  If symptoms are not adequately controlled we will add famotidine at bedtime.  She inquired about the possibility of TIF procedure which is a reasonable option however want to gain better control of her reflux symptoms before consideration of referral.  4. Personal history of SSP in 2015. Colonoscopy as above.    cc: [name], MD [address], [place] [address]

## 2019-02-06 ENCOUNTER — Encounter: Payer: Self-pay | Admitting: Gastroenterology

## 2019-02-18 ENCOUNTER — Telehealth: Payer: Self-pay | Admitting: *Deleted

## 2019-02-18 NOTE — Telephone Encounter (Signed)
Covid-19 travel screening questions  Have you traveled in the last 14 days? No If yes where?  Do you now or have you had a fever in the last 14 days? No  Do you have any respiratory symptoms of shortness of breath or cough now or in the last 14 days? No  Do you have any family members or close contacts with diagnosed or suspected Covid-19? No       

## 2019-02-19 ENCOUNTER — Ambulatory Visit (AMBULATORY_SURGERY_CENTER): Payer: 59 | Admitting: Gastroenterology

## 2019-02-19 ENCOUNTER — Other Ambulatory Visit: Payer: Self-pay

## 2019-02-19 ENCOUNTER — Encounter: Payer: Self-pay | Admitting: Gastroenterology

## 2019-02-19 VITALS — BP 132/70 | HR 75 | Temp 98.6°F | Resp 16 | Ht 64.0 in | Wt 173.0 lb

## 2019-02-19 DIAGNOSIS — D123 Benign neoplasm of transverse colon: Secondary | ICD-10-CM

## 2019-02-19 DIAGNOSIS — K5730 Diverticulosis of large intestine without perforation or abscess without bleeding: Secondary | ICD-10-CM

## 2019-02-19 DIAGNOSIS — Z1211 Encounter for screening for malignant neoplasm of colon: Secondary | ICD-10-CM | POA: Diagnosis not present

## 2019-02-19 DIAGNOSIS — D128 Benign neoplasm of rectum: Secondary | ICD-10-CM

## 2019-02-19 DIAGNOSIS — Z86010 Personal history of colonic polyps: Secondary | ICD-10-CM

## 2019-02-19 DIAGNOSIS — R197 Diarrhea, unspecified: Secondary | ICD-10-CM

## 2019-02-19 DIAGNOSIS — D129 Benign neoplasm of anus and anal canal: Secondary | ICD-10-CM

## 2019-02-19 MED ORDER — SODIUM CHLORIDE 0.9 % IV SOLN: 500.0000 mL | Freq: Once | INTRAVENOUS | Status: DC

## 2019-02-19 NOTE — Progress Notes (Signed)
Called to room to assist during endoscopic procedure.  Patient ID and intended procedure confirmed with present staff. Received instructions for my participation in the procedure from the performing physician.  

## 2019-02-19 NOTE — Progress Notes (Signed)
PT taken to PACU. Monitors in place. VSS. Report given to RN. 

## 2019-02-19 NOTE — Patient Instructions (Signed)
Handouts given for polyps and Diverticulosis   YOU HAD AN ENDOSCOPIC PROCEDURE TODAY AT [facility]:   Refer to the procedure report that was given to you for any specific questions about what was found during the examination.  If the procedure report does not answer your questions, please call your gastroenterologist to clarify.  If you requested that your care partner not be given the details of your procedure findings, then the procedure report has been included in a sealed envelope for you to review at your convenience later.  YOU SHOULD EXPECT: Some feelings of bloating in the abdomen. Passage of more gas than usual.  Walking can help get rid of the air that was put into your GI tract during the procedure and reduce the bloating. If you had a lower endoscopy (such as a colonoscopy or flexible sigmoidoscopy) you may notice spotting of blood in your stool or on the toilet paper. If you underwent a bowel prep for your procedure, you may not have a normal bowel movement for a few days.  Please Note:  You might notice some irritation and congestion in your nose or some drainage.  This is from the oxygen used during your procedure.  There is no need for concern and it should clear up in a day or so.  SYMPTOMS TO REPORT IMMEDIATELY:   Following lower endoscopy (colonoscopy or flexible sigmoidoscopy):  Excessive amounts of blood in the stool  Significant tenderness or worsening of abdominal pains  Swelling of the abdomen that is new, acute  Fever of 100F or higher    For urgent or emergent issues, a gastroenterologist can be reached at any hour by calling [phone].   DIET:  We do recommend a small meal at first, but then you may proceed to your regular diet.  Drink plenty of fluids but you should avoid alcoholic beverages for 24 hours.  ACTIVITY:  You should plan to take it easy for the rest of today and you should NOT DRIVE or use heavy machinery until tomorrow (because  of the sedation medicines used during the test).    FOLLOW UP: Our staff will call the number listed on your records the next business day following your procedure to check on you and address any questions or concerns that you may have regarding the information given to you following your procedure. If we do not reach you, we will leave a message.  However, if you are feeling well and you are not experiencing any problems, there is no need to return our call.  We will assume that you have returned to your regular daily activities without incident.  If any biopsies were taken you will be contacted by phone or by letter within the next 1-3 weeks.  Please call us at [phone] if you have not heard about the biopsies in 3 weeks.    SIGNATURES/CONFIDENTIALITY: You and/or your care partner have signed paperwork which will be entered into your electronic medical record.  These signatures attest to the fact that that the information above on your After Visit Summary has been reviewed and is understood.  Full responsibility of the confidentiality of this discharge information lies with you and/or your care-partner.

## 2019-02-19 NOTE — Op Note (Signed)
[name] Procedure Date: 02/19/2019 1:33 PM MRN: [id number] Endoscopist: Ladene Artist , MD Age: 63 Referring MD:  Date of Birth: 22-Dec-1955 Gender: Female Account #: [id number] Procedure:                Colonoscopy Indications:              High risk colon cancer surveillance: Personal                            history of sessile serrated colon polyp (less than                            10 mm in size) with no dysplasia. Diarrhea. Medicines:                Monitored Anesthesia Care Procedure:                Pre-Anesthesia Assessment:                           - Prior to the procedure, a History and Physical                            was performed, and patient medications and                            allergies were reviewed. The patient's tolerance of                            previous anesthesia was also reviewed. The risks                            and benefits of the procedure and the sedation                            options and risks were discussed with the patient.                            All questions were answered, and informed consent                            was obtained. Prior Anticoagulants: The patient has                            taken no previous anticoagulant or antiplatelet                            agents. ASA Grade Assessment: II - A patient with                            mild systemic disease. After reviewing the risks                            and benefits, the patient was deemed in  satisfactory condition to undergo the procedure.                           After obtaining informed consent, the colonoscope                            was passed under direct vision. Throughout the                            procedure, the patient's blood pressure, pulse, and                            oxygen saturations were monitored continuously. The                            Colonoscope  was introduced through the anus and                            advanced to the the terminal ileum, with                            identification of the appendiceal orifice and IC                            valve. The terminal ileum, ileocecal valve,                            appendiceal orifice, and rectum were photographed.                            The quality of the bowel preparation was excellent.                            The colonoscopy was performed without difficulty.                            The patient tolerated the procedure well. Scope In: 1:46:41 PM Scope Out: 2:02:16 PM Scope Withdrawal Time: 0 hours 12 minutes 0 seconds  Total Procedure Duration: 0 hours 15 minutes 35 seconds  Findings:                 The perianal and digital rectal examinations were                            normal.                           The terminal ileum appeared normal.                           A 5 mm polyp was found in the transverse colon. The                            polyp was sessile. The polyp was removed with a  cold snare. Resection and retrieval were complete.                           A 4 mm polyp was found in the rectum. The polyp was                            sessile. The polyp was removed with a cold biopsy                            forceps. Resection and retrieval were complete.                           Multiple small-mouthed diverticula were found in                            the left colon. There was narrowing of the colon in                            association with the diverticular opening. There                            was evidence of diverticular spasm. There was no                            evidence of diverticular bleeding.                           External hemorrhoids were found during                            retroflexion. The hemorrhoids were small.                           The exam was otherwise without abnormality on                             direct and retroflexion views. Multiple random                            biopsies obtained throughout colon. Complications:            No immediate complications. Estimated blood loss:                            None. Estimated Blood Loss:     Estimated blood loss: none. Impression:               - The examined portion of the ileum was normal.                           - One 5 mm polyp in the transverse colon, removed                            with a cold snare. Resected and retrieved.                           -  One 4 mm polyp in the rectum, removed with a cold                            biopsy forceps. Resected and retrieved.                           - Moderate diverticulosis in the left colon.                           - External hemorrhoids.                           - The examination was otherwise normal on direct                            and retroflexion views. Recommendation:           - Repeat colonoscopy in 7 years for surveillance                            pending pathology review.                           - Patient has a contact number available for                            emergencies. The signs and symptoms of potential                            delayed complications were discussed with the                            patient. Return to normal activities tomorrow.                            Written discharge instructions were provided to the                            patient.                           - High fiber diet.                           - Continue present medications.                           - Await pathology results. Ladene Artist, MD 02/19/2019 2:17:39 PM This report has been signed electronically.

## 2019-02-20 ENCOUNTER — Telehealth: Payer: Self-pay

## 2019-02-20 NOTE — Telephone Encounter (Signed)
Follow up call, left message.

## 2019-02-21 ENCOUNTER — Other Ambulatory Visit: Payer: Self-pay

## 2019-02-21 ENCOUNTER — Other Ambulatory Visit: Payer: Self-pay | Admitting: Cardiovascular Disease

## 2019-02-21 MED ORDER — METOPROLOL SUCCINATE ER 100 MG PO TB24: ORAL_TABLET | ORAL | 3 refills | Status: DC

## 2019-02-21 MED FILL — traZODone HCL 150 MG TABS: 150 | 90 days supply | Qty: 90 | Fill #1

## 2019-02-21 MED FILL — METOPROLOL SUCCINATE ER 100: 100 | 90 days supply | Qty: 180 | Fill #0

## 2019-02-26 ENCOUNTER — Encounter: Payer: Self-pay | Admitting: Gastroenterology

## 2019-03-04 ENCOUNTER — Ambulatory Visit: Payer: 59 | Admitting: Family Medicine

## 2019-03-06 ENCOUNTER — Other Ambulatory Visit: Payer: Self-pay | Admitting: Family Medicine

## 2019-03-06 MED FILL — TEMAZEPAM 30 MG CAPSULE: 30 | 30 days supply | Qty: 30 | Fill #0

## 2019-03-06 NOTE — Telephone Encounter (Signed)
Requesting:ativan  Contract: yes UDS:no [facility] employee Last OV:12/17/18 Next OV:03/18/19 Last Refill:10/24/18 Database:   Please advise

## 2019-03-07 MED FILL — LORazepam 1 MG TABS: 1 | 23 days supply | Qty: 70 | Fill #0

## 2019-03-17 ENCOUNTER — Telehealth: Payer: Self-pay

## 2019-03-17 NOTE — Telephone Encounter (Signed)
thanks

## 2019-03-17 NOTE — Telephone Encounter (Signed)
Called patient. States she spoke to P. Eulas Post regarding her appointment.

## 2019-03-17 NOTE — Telephone Encounter (Signed)
Copied from [place] [id number]. Topic: Appointment Scheduling - Scheduling Inquiry for Clinic >> Mar 14, 2019 12:29 PM Nils Flack wrote: Reason for CRM: pt would like to have a virtual visit visit for her appt on tues 03/18/19

## 2019-03-18 ENCOUNTER — Ambulatory Visit (INDEPENDENT_AMBULATORY_CARE_PROVIDER_SITE_OTHER): Payer: 59 | Admitting: Family Medicine

## 2019-03-18 ENCOUNTER — Other Ambulatory Visit: Payer: Self-pay

## 2019-03-18 DIAGNOSIS — L989 Disorder of the skin and subcutaneous tissue, unspecified: Secondary | ICD-10-CM | POA: Diagnosis not present

## 2019-03-18 DIAGNOSIS — R739 Hyperglycemia, unspecified: Secondary | ICD-10-CM | POA: Diagnosis not present

## 2019-03-18 DIAGNOSIS — K589 Irritable bowel syndrome without diarrhea: Secondary | ICD-10-CM | POA: Diagnosis not present

## 2019-03-18 DIAGNOSIS — R197 Diarrhea, unspecified: Secondary | ICD-10-CM

## 2019-03-18 DIAGNOSIS — R000 Tachycardia, unspecified: Secondary | ICD-10-CM

## 2019-03-18 DIAGNOSIS — L659 Nonscarring hair loss, unspecified: Secondary | ICD-10-CM

## 2019-03-18 NOTE — Assessment & Plan Note (Signed)
95% better after colonoscopy and with the addition if Bentyl once to twice daily and Protonix 40 mg po bid

## 2019-03-18 NOTE — Assessment & Plan Note (Signed)
No c/o palpitations at visit

## 2019-03-18 NOTE — Assessment & Plan Note (Signed)
She describes it as black and start shaped. Has an appt with dermatology on May 1.

## 2019-03-18 NOTE — Assessment & Plan Note (Signed)
hgba1c acceptable, minimize simple carbs. Increase exercise as tolerated.  

## 2019-03-18 NOTE — Progress Notes (Signed)
Virtual Visit via Video Note  I connected with [name] Jensen on 03/18/19 at  3:40 PM EDT by a video enabled telemedicine application and verified that I am speaking with the correct person using two identifiers.   I discussed the limitations of evaluation and management by telemedicine and the availability of in person appointments. The patient expressed understanding and agreed to proceed.    Subjective:    Patient ID: [name] Jensen, female    DOB: [birth year], 63 y.o.   MRN: [id number]  No chief complaint on file.   HPI Patient is in today for follow up on tachycardia, reflux, IBS D, hyperglycemia, hyperlipidemia andmore. She feels anxious and is struggling with anhedonia. 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 No recent febrile illness or hospitalizations. Her abdominal pain and diarrhea are nearly resolved since her colonoscopy and Bentyl has been very helpful when needed. Reflux well controlled on Pantoprazole bid. She is noting hair loss since starting the Pantoprazole. Her hairdressor noted a a black, irreguarly shaped lesion on her scalp and she has an appointment with dermatology on May 1. Denies CP/palp/SOB/HA/congestion/fevers or GU c/o. Taking meds as prescribed  Past Medical History:  Diagnosis Date  . Adjustment reaction with anxiety and depression 06/12/2014  . Anemia    h/o low ferritin  . Anxiety   . Asthma    environmental triggers  . Chicken pox as a child  . Cough 08/22/2015  . Depression   . Depression with anxiety 06/12/2014  . Diverticulosis   . Ehrlichiosis 3419  . Gallstones   . GERD (gastroesophageal reflux disease)   . Hair loss 03/28/2017  . Hiatal hernia 2007  . History of viral illness   . Hyperlipidemia, mixed 04/11/2015  . Insomnia 08/21/2017  . Lactose intolerance 06/12/2014  . Mumps as a child  . Overweight 08/19/2014  . Rectocele 06/12/2014  . RLS (restless legs syndrome)   . 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 Sessile colonic polyp   . SVT (supraventricular tachycardia) ([facility])   . Tachycardia 06/12/2014  . Thyroid disease   . Vitamin D deficiency 08/22/2015    Past Surgical History:  Procedure Laterality Date  . ABDOMINAL HYSTERECTOMY  11-2008   total with cystocele, rectocele repair  . APPENDECTOMY    . CHOLECYSTECTOMY    . COLONOSCOPY  2006   diverticulosis, hemorrhoids ([place])  . ESOPHAGOGASTRODUODENOSCOPY  2006   small hiatus hernia ([place])  . ESOPHAGOGASTRODUODENOSCOPY N/A 02/21/2018   Procedure: ESOPHAGOGASTRODUODENOSCOPY (EGD);  Surgeon: Rogene Houston, MD;  Location: [facility];  Service: Endoscopy;  Laterality: N/A;  pt knows to arrive at 2:45  . INCONTINENCE SURGERY    . SKIN SURGERY     right arm scc  . svt ablation    . TONSILLECTOMY  1972   2ith adenoid  . WISDOM TOOTH EXTRACTION  63 yrs old    Family History  Problem Relation Age of Onset  . Hypertension Mother   . Hypertension Father   . Hypertension Sister   . Diabetes Sister        type 2  . Fibromyalgia Sister   . GI Bleed Maternal Grandmother   . Atrial fibrillation Maternal Grandmother   . Heart disease Maternal Grandfather   . Diabetes Paternal Grandmother   . Stroke Paternal Grandfather   . Proteinuria Sister   . Colon cancer Neg Hx   .  Esophageal cancer Neg Hx   . Rectal cancer Neg Hx   . Stomach cancer Neg Hx     Social History   Socioeconomic History  . Marital status: Divorced    Spouse name: Not on file  . Number of children: 2  . Years of education: Not on file  . Highest education level: Not on file  Occupational History  . Occupation: Programmer, multimedia: [employer]  . Financial resource strain: Not on file  . Food insecurity:    Worry: Not on file    Inability: Not on file  . Transportation needs:    Medical: Not on file    Non-medical: Not on file  Tobacco Use  . Smoking status: Never Smoker  .  Smokeless tobacco: Never Used  Substance and Sexual Activity  . Alcohol use: No    Alcohol/week: 0.0 standard drinks  . Drug use: No  . Sexual activity: Not on file    Comment: lives by self with 4 dogs works at [employer], avoids dairy  Lifestyle  . Physical activity:    Days per week: Not on file    Minutes per session: Not on file  . Stress: Not on file  Relationships  . Social connections:    Talks on phone: Not on file    Gets together: Not on file    Attends religious service: Not on file    Active member of club or organization: Not on file    Attends meetings of clubs or organizations: Not on file    Relationship status: Not on file  . Intimate partner violence:    Fear of current or ex partner: Not on file    Emotionally abused: Not on file    Physically abused: Not on file    Forced sexual activity: Not on file  Other Topics Concern  . Not on file  Social History Narrative  . Not on file    Outpatient Medications Prior to Visit  Medication Sig Dispense Refill  . Cholecalciferol (VITAMIN D3) 5000 units CAPS Take 5,000 Units by mouth daily.     . ciprofloxacin (CIPRO) 500 MG tablet Take 500 mg by mouth 2 (two) times daily.    Marland Kitchen dicyclomine (BENTYL) 10 MG capsule Take 1 capsule (10 mg total) by mouth 3 (three) times daily before meals. 270 capsule 3  . LORazepam (ATIVAN) 1 MG tablet TAKE 1/2 TO 1 TABLET BY MOUTH EVERY 8 HOURS AS NEEDED FOR ANXIETY 70 tablet 2  . metoprolol succinate (TOPROL-XL) 100 MG 24 hr tablet TAKE 1 TABLET BY MOUTH 2 TIMES DAILY. TAKE WITH OR IMMEDIATELY FOLLOWING A MEAL. 180 tablet 3  . metroNIDAZOLE (FLAGYL) 500 MG tablet Take 1 tablet (500 mg total) by mouth 3 (three) times daily. (Patient not taking: Reported on 02/05/2019) 21 tablet 0  . Omega-3 Fatty Acids (FISH OIL PO) Take 2,000 mg by mouth 2 (two) times daily.    . pantoprazole (PROTONIX) 40 MG tablet Take 1 tablet (40 mg total) by mouth 2 (two) times daily before a meal. 180 tablet 3  .  temazepam (RESTORIL) 30 MG capsule Take 1 capsule po at bedtime 30 capsule 3  . traZODone (DESYREL) 150 MG tablet TAKE 1 TABLET BY MOUTH AT BEDTIME. 90 tablet 1  . 0.9 %  sodium chloride infusion      No facility-administered medications prior to visit.     Allergies  Allergen Reactions  . Dilaudid [Hydromorphone Hcl]  Other (See Comments)    Respiratory , cardiac     Review of Systems  Constitutional: Positive for malaise/fatigue. Negative for fever.  HENT: Negative for congestion.   Eyes: Negative for blurred vision.  Respiratory: Negative for shortness of breath.   Cardiovascular: Negative for chest pain, palpitations and leg swelling.  Gastrointestinal: Positive for abdominal pain and diarrhea. Negative for blood in stool and nausea.  Genitourinary: Negative for dysuria and frequency.  Musculoskeletal: Negative for falls.  Skin: Negative for rash.  Neurological: Negative for dizziness, loss of consciousness and headaches.  Endo/Heme/Allergies: Negative for environmental allergies.  Psychiatric/Behavioral: Positive for depression. The patient is nervous/anxious.        Objective:    Physical Exam Constitutional:      Appearance: Normal appearance.  HENT:     Head: Normocephalic and atraumatic.     Nose: Nose normal.  Pulmonary:     Effort: Pulmonary effort is normal.  Neurological:     Mental Status: She is alert and oriented to person, place, and time.  Psychiatric:        Mood and Affect: Mood normal.        Behavior: Behavior normal.     LMP  (LMP Unknown)  Wt Readings from Last 3 Encounters:  02/19/19 173 lb (78.5 kg)  02/05/19 173 lb 8 oz (78.7 kg)  12/17/18 173 lb 6.4 oz (78.7 kg)    Diabetic Foot Exam - Simple   No data filed     Lab Results  Component Value Date   WBC 12.4 (H) 11/11/2018   HGB 14.0 11/11/2018   HCT 41.1 11/11/2018   PLT 302.0 11/11/2018   GLUCOSE 131 (H) 01/29/2019   CHOL 196 10/22/2018   TRIG 149.0 10/22/2018   HDL 47.90  10/22/2018   LDLDIRECT 91.0 09/25/2018   LDLCALC 118 (H) 10/22/2018   ALT 27 11/11/2018   AST 26 11/11/2018   NA 140 01/29/2019   K 4.0 01/29/2019   CL 106 01/29/2019   CREATININE 0.73 01/29/2019   BUN 13 01/29/2019   CO2 24 01/29/2019   TSH 1.20 09/25/2018   INR 1.0 11/09/2008   HGBA1C 5.9 09/25/2018    Lab Results  Component Value Date   TSH 1.20 09/25/2018   Lab Results  Component Value Date   WBC 12.4 (H) 11/11/2018   HGB 14.0 11/11/2018   HCT 41.1 11/11/2018   MCV 83.3 11/11/2018   PLT 302.0 11/11/2018   Lab Results  Component Value Date   NA 140 01/29/2019   K 4.0 01/29/2019   CO2 24 01/29/2019   GLUCOSE 131 (H) 01/29/2019   BUN 13 01/29/2019   CREATININE 0.73 01/29/2019   BILITOT 1.1 11/11/2018   ALKPHOS 74 11/11/2018   AST 26 11/11/2018   ALT 27 11/11/2018   PROT 7.3 11/11/2018   ALBUMIN 4.0 11/11/2018   CALCIUM 9.2 01/29/2019   ANIONGAP 10 01/29/2019   GFR 78.82 11/11/2018   Lab Results  Component Value Date   CHOL 196 10/22/2018   Lab Results  Component Value Date   HDL 47.90 10/22/2018   Lab Results  Component Value Date   LDLCALC 118 (H) 10/22/2018   Lab Results  Component Value Date   TRIG 149.0 10/22/2018   Lab Results  Component Value Date   CHOLHDL 4 10/22/2018   Lab Results  Component Value Date   HGBA1C 5.9 09/25/2018       Assessment & Plan:   Problem List Items Addressed This Visit  Tachycardia    No c/o palpitations at visit      Diarrhea    95% better after colonoscopy and with the addition if Bentyl once to twice daily and Protonix 40 mg po bid      Hyperglycemia    hgba1c acceptable, minimize simple carbs. Increase exercise as tolerated.       Hair loss    Encouraged to try and add Zinc 50 mg daily, biotin and a fatty acid supplements such as Fish oil daily. Consider Minoxdil if persists.       Skin lesion of scalp    She describes it as black and start shaped. Has an appt with dermatology on May 1.       IBS (irritable bowel syndrome)    Diarrhea predomint doing much better needs to consider FODMAP diet.          I am having Barnetta Chapel A. Reisen maintain her Vitamin D3, temazepam, Omega-3 Fatty Acids (FISH OIL PO), metroNIDAZOLE, ciprofloxacin, pantoprazole, dicyclomine, metoprolol succinate, LORazepam, and traZODone. We will stop administering sodium chloride.  No orders of the defined types were placed in this encounter.    I discussed the assessment and treatment plan with the patient. The patient was provided an opportunity to ask questions and all were answered. The patient agreed with the plan and demonstrated an understanding of the instructions.   The patient was advised to call back or seek an in-person evaluation if the symptoms worsen or if the condition fails to improve as anticipated.  I provided 25 minutes of non-face-to-face time during this encounter.   Penni Homans, MD

## 2019-03-18 NOTE — Assessment & Plan Note (Signed)
Encouraged to try and add Zinc 50 mg daily, biotin and a fatty acid supplements such as Fish oil daily. Consider Minoxdil if persists.

## 2019-03-18 NOTE — Assessment & Plan Note (Signed)
Diarrhea predomint doing much better needs to consider FODMAP diet.

## 2019-03-25 ENCOUNTER — Ambulatory Visit (INDEPENDENT_AMBULATORY_CARE_PROVIDER_SITE_OTHER): Payer: 59 | Admitting: Internal Medicine

## 2019-04-04 DIAGNOSIS — D229 Melanocytic nevi, unspecified: Secondary | ICD-10-CM | POA: Diagnosis not present

## 2019-04-04 DIAGNOSIS — Z85828 Personal history of other malignant neoplasm of skin: Secondary | ICD-10-CM | POA: Diagnosis not present

## 2019-04-04 DIAGNOSIS — L738 Other specified follicular disorders: Secondary | ICD-10-CM | POA: Diagnosis not present

## 2019-04-04 DIAGNOSIS — D485 Neoplasm of uncertain behavior of skin: Secondary | ICD-10-CM | POA: Diagnosis not present

## 2019-04-04 DIAGNOSIS — L989 Disorder of the skin and subcutaneous tissue, unspecified: Secondary | ICD-10-CM | POA: Diagnosis not present

## 2019-04-04 DIAGNOSIS — L814 Other melanin hyperpigmentation: Secondary | ICD-10-CM | POA: Diagnosis not present

## 2019-04-04 DIAGNOSIS — D1801 Hemangioma of skin and subcutaneous tissue: Secondary | ICD-10-CM | POA: Diagnosis not present

## 2019-04-04 DIAGNOSIS — L821 Other seborrheic keratosis: Secondary | ICD-10-CM | POA: Diagnosis not present

## 2019-04-06 ENCOUNTER — Encounter: Payer: Self-pay | Admitting: Family Medicine

## 2019-04-07 ENCOUNTER — Other Ambulatory Visit: Payer: Self-pay | Admitting: Family Medicine

## 2019-04-07 MED ORDER — TEMAZEPAM 30 MG PO CAPS: ORAL_CAPSULE | ORAL | 3 refills | Status: DC

## 2019-04-07 MED FILL — LORazepam 1 MG TABS: 1 | 23 days supply | Qty: 70 | Fill #1

## 2019-04-14 ENCOUNTER — Telehealth: Payer: Self-pay | Admitting: Family Medicine

## 2019-04-14 ENCOUNTER — Other Ambulatory Visit: Payer: Self-pay | Admitting: Family Medicine

## 2019-04-14 NOTE — Telephone Encounter (Signed)
Copied from [place] [id number]. Topic: Quick Communication - Rx Refill/Question >> Apr 14, 2019  3:14 PM Blase Mess A wrote: Medication: temazepam (RESTORIL) 30 MG capsule [[id number]] [facility]. That is closed items need to be filled at [facility]  Has the patient contacted their pharmacy? Yes  (Agent: If no, request that the patient contact the pharmacy for the refill.) (Agent: If yes, when and what did the pharmacy advise?)  Preferred Pharmacy (with phone number or street name):  [place], [place] - [place] [phone] (Phone) [phone] (Fax)     Agent: Please be advised that RX refills may take up to 3 business days. We ask that you follow-up with your pharmacy.

## 2019-04-15 ENCOUNTER — Other Ambulatory Visit: Payer: Self-pay | Admitting: Family Medicine

## 2019-04-15 MED ORDER — TEMAZEPAM 30 MG PO CAPS: ORAL_CAPSULE | ORAL | 5 refills | Status: DC

## 2019-04-15 MED FILL — TEMAZEPAM 30 MG CAPSULE: 30 | 30 days supply | Qty: 30 | Fill #0

## 2019-04-15 NOTE — Telephone Encounter (Signed)
Can you resend script for temazepam to [facility] please?

## 2019-04-15 NOTE — Telephone Encounter (Signed)
done

## 2019-05-05 MED FILL — traZODone HCL 150 MG TABS: 150 | 90 days supply | Qty: 90 | Fill #0

## 2019-05-06 MED FILL — PANTOPRAZOLE SOD DR 40 MG T: 40 | 90 days supply | Qty: 180 | Fill #0

## 2019-05-06 MED FILL — DICYCLOMINE 10 MG CAPSULE: 10 | 90 days supply | Qty: 270 | Fill #0

## 2019-05-06 MED FILL — LORazepam 1 MG TABS: 1 | 23 days supply | Qty: 70 | Fill #2

## 2019-05-13 MED FILL — TEMAZEPAM 30 MG CAPSULE: 30 | 30 days supply | Qty: 30 | Fill #1

## 2019-05-20 ENCOUNTER — Telehealth: Payer: Self-pay

## 2019-05-20 NOTE — Telephone Encounter (Signed)
Copied from [place] [phone]. Topic: Appointment Scheduling - Scheduling Inquiry for Clinic >> May 19, 2019 12:50 PM Alanda Slim E wrote: Reason for CRM: Pt has an appt scheduled for 7.10.20 but is having a procedure and wants to know if she can come in any later around 10:30 or better at 11:20 the same day/ please advise and leave a Vm if it is ok to do 11:20 on 7.10.20 due to this being one of the only days the Pt is available     Called patient and switched appointment

## 2019-05-29 ENCOUNTER — Ambulatory Visit: Payer: 59 | Admitting: Family Medicine

## 2019-06-04 ENCOUNTER — Telehealth: Payer: Self-pay | Admitting: Family Medicine

## 2019-06-04 NOTE — Telephone Encounter (Signed)
Please advise 

## 2019-06-04 NOTE — Telephone Encounter (Signed)
Pt called stating she is trying to get in for appt or virtual but is requesting after 5pm if possible. 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 Please advise.

## 2019-06-04 NOTE — Telephone Encounter (Signed)
I could do a virtual visitg for her on Monday at 5 or 5:15 or Tuesday

## 2019-06-09 ENCOUNTER — Other Ambulatory Visit: Payer: Self-pay | Admitting: Family Medicine

## 2019-06-09 MED FILL — TEMAZEPAM 30 MG CAPSULE: 30 | 30 days supply | Qty: 30 | Fill #2

## 2019-06-09 MED FILL — LORazepam 1 MG TABS: 1 | 23 days supply | Qty: 70 | Fill #0

## 2019-06-09 NOTE — Telephone Encounter (Signed)
Requesting: ativan  Contract:n/a UDS:n/a Last OV:03/18/19 Next OV:06/13/19 Last Refill:03/06/19  #[id number] Database:   Please advise

## 2019-06-09 NOTE — Telephone Encounter (Signed)
Can you call patient and set up appt. If it needs an override I will do it just let me know

## 2019-06-10 ENCOUNTER — Ambulatory Visit (INDEPENDENT_AMBULATORY_CARE_PROVIDER_SITE_OTHER): Payer: 59 | Admitting: Family Medicine

## 2019-06-10 ENCOUNTER — Other Ambulatory Visit: Payer: Self-pay

## 2019-06-10 DIAGNOSIS — K589 Irritable bowel syndrome without diarrhea: Secondary | ICD-10-CM | POA: Diagnosis not present

## 2019-06-10 DIAGNOSIS — R51 Headache: Secondary | ICD-10-CM

## 2019-06-10 DIAGNOSIS — E782 Mixed hyperlipidemia: Secondary | ICD-10-CM

## 2019-06-10 DIAGNOSIS — F418 Other specified anxiety disorders: Secondary | ICD-10-CM

## 2019-06-10 DIAGNOSIS — R739 Hyperglycemia, unspecified: Secondary | ICD-10-CM

## 2019-06-10 DIAGNOSIS — E559 Vitamin D deficiency, unspecified: Secondary | ICD-10-CM | POA: Diagnosis not present

## 2019-06-10 DIAGNOSIS — R519 Headache, unspecified: Secondary | ICD-10-CM

## 2019-06-10 NOTE — Assessment & Plan Note (Signed)
Supplement and monitor 

## 2019-06-10 NOTE — Assessment & Plan Note (Addendum)
Has had long standing trouble with diarrhea of abdominal pain and symptoms have increased a bit recently again. Avoid offending foods, start probiotics. Do not eat large meals in late evening and consider raising head of bed. Use current meds as needed. Gets some relief with bentyl so she will start using that again and report worsening symptoms

## 2019-06-10 NOTE — Assessment & Plan Note (Signed)
Encouraged heart healthy diet, increase exercise, avoid trans fats, agrees to labs next week at [facility]

## 2019-06-10 NOTE — Assessment & Plan Note (Signed)
Her anxiety is heightened by the pandemic but she acknowledges her depression is some better and she is helping to take care of her elderly mother while continuing to work. No changes to therapy at this time.

## 2019-06-10 NOTE — Assessment & Plan Note (Signed)
With pain and tenderness over scalp she actually thought she was getting shingles because the pain was intense and burned but then no lesions developed and the pain resolved after she took 3 doses of Prednisone 20 mg daily. Will check labs

## 2019-06-10 NOTE — Progress Notes (Signed)
Virtual Visit via phone Note  I connected with [name] Jensen on 06/10/19 at  5:00 PM EDT by a phone enabled telemedicine application and verified that I am speaking with the correct person using two identifiers.  Location: Patient: work Provider: home   I discussed the limitations of evaluation and management by telemedicine and the availability of in person appointments. The patient expressed understanding and agreed to proceed. [name] CMA attempted to get patient set up on video visit but was unable to visit completed via phone     Subjective:    Patient ID: [name] Jensen, female    DOB: 08-22-1956, 63 y.o.   MRN: [id number]  No chief complaint on file.   HPI Patient is in today for follow up on chronic medical concerns including hyperlipidemia, hyperglycemia and depression with anxiety. She is doing well today and has continued to work with cardiology. She acknowledges the pandemic has heightened her anxiety. No recent febrile illness or hospitalizations. No polyuria or polydipsia. She notes anhedonia but no suicidal ideation. Her stomach is her biggest concern recently with her diarrhea and abdominal cramping returning recently. She notes some increase in her headaches as well without photophobia or phonophobia. Denies CP/palp/SOB/HA/congestion/fevers or GU c/o. Taking meds as prescribed  Past Medical History:  Diagnosis Date  . Adjustment reaction with anxiety and depression 06/12/2014  . Anemia    h/o low ferritin  . Anxiety   . Asthma    environmental triggers  . Chicken pox as a child  . Cough 08/22/2015  . Depression   . Depression with anxiety 06/12/2014  . Diverticulosis   . Ehrlichiosis 6468  . Gallstones   . GERD (gastroesophageal reflux disease)   . Hair loss 03/28/2017  . Hiatal hernia 2007  . History of viral illness   . Hyperlipidemia, mixed 04/11/2015  . Insomnia 08/21/2017  . Lactose intolerance 06/12/2014  . Mumps as a child  . Overweight  08/19/2014  . Rectocele 06/12/2014  . RLS (restless legs syndrome)   . 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 Sessile colonic polyp   . SVT (supraventricular tachycardia) ([place])   . Tachycardia 06/12/2014  . Thyroid disease   . Vitamin D deficiency 08/22/2015    Past Surgical History:  Procedure Laterality Date  . ABDOMINAL HYSTERECTOMY  11-2008   total with cystocele, rectocele repair  . APPENDECTOMY    . CHOLECYSTECTOMY    . COLONOSCOPY  2006   diverticulosis, hemorrhoids ([facility])  . ESOPHAGOGASTRODUODENOSCOPY  2006   small hiatus hernia ([facility])  . ESOPHAGOGASTRODUODENOSCOPY N/A 02/21/2018   Procedure: ESOPHAGOGASTRODUODENOSCOPY (EGD);  Surgeon: Rogene Houston, MD;  [facility];  Service: Endoscopy;  Laterality: N/A;  pt knows to arrive at 2:45  . INCONTINENCE SURGERY    . SKIN SURGERY     right arm scc  . svt ablation    . TONSILLECTOMY  1972   2ith adenoid  . WISDOM TOOTH EXTRACTION  63 yrs old    Family History  Problem Relation Age of Onset  . Hypertension Mother   . Hypertension Father   . Hypertension Sister   . Diabetes Sister        type 2  . Fibromyalgia Sister   . GI Bleed Maternal Grandmother   . Atrial fibrillation Maternal Grandmother   . Heart disease Maternal Grandfather   . Diabetes Paternal Grandmother   . Stroke Paternal Grandfather   . Proteinuria Sister   .  Colon cancer Neg Hx   . Esophageal cancer Neg Hx   . Rectal cancer Neg Hx   . Stomach cancer Neg Hx     Social History   Socioeconomic History  . Marital status: Divorced    Spouse name: Not on file  . Number of children: 2  . Years of education: Not on file  . Highest education level: Not on file  Occupational History  . Occupation: Programmer, multimedia: [employer]  . Financial resource strain: Not on file  . Food insecurity    Worry: Not on file    Inability: Not on file  . Transportation needs    Medical: Not on file     Non-medical: Not on file  Tobacco Use  . Smoking status: Never Smoker  . Smokeless tobacco: Never Used  Substance and Sexual Activity  . Alcohol use: No    Alcohol/week: 0.0 standard drinks  . Drug use: No  . Sexual activity: Not on file    Comment: lives by self with 4 dogs works at [employer], avoids dairy  Lifestyle  . Physical activity    Days per week: Not on file    Minutes per session: Not on file  . Stress: Not on file  Relationships  . Social Herbalist on phone: Not on file    Gets together: Not on file    Attends religious service: Not on file    Active member of club or organization: Not on file    Attends meetings of clubs or organizations: Not on file    Relationship status: Not on file  . Intimate partner violence    Fear of current or ex partner: Not on file    Emotionally abused: Not on file    Physically abused: Not on file    Forced sexual activity: Not on file  Other Topics Concern  . Not on file  Social History Narrative  . Not on file    Outpatient Medications Prior to Visit  Medication Sig Dispense Refill  . Cholecalciferol (VITAMIN D3) 5000 units CAPS Take 5,000 Units by mouth daily.     Marland Kitchen dicyclomine (BENTYL) 10 MG capsule Take 1 capsule (10 mg total) by mouth 3 (three) times daily before meals. 270 capsule 3  . LORazepam (ATIVAN) 1 MG tablet TAKE 1/2-1 TABLET BY MOUTH EVERY 8 HOURS AS NEEDED FOR ANXIETY 70 tablet 2  . metoprolol succinate (TOPROL-XL) 100 MG 24 hr tablet TAKE 1 TABLET BY MOUTH 2 TIMES DAILY. TAKE WITH OR IMMEDIATELY FOLLOWING A MEAL. 180 tablet 3  . metroNIDAZOLE (FLAGYL) 500 MG tablet Take 1 tablet (500 mg total) by mouth 3 (three) times daily. (Patient not taking: Reported on 02/05/2019) 21 tablet 0  . Omega-3 Fatty Acids (FISH OIL PO) Take 2,000 mg by mouth 2 (two) times daily.    . pantoprazole (PROTONIX) 40 MG tablet Take 1 tablet (40 mg total) by mouth 2 (two) times daily before a meal. 180 tablet 3  . temazepam  (RESTORIL) 30 MG capsule Take 1 capsule po at bedtime 30 capsule 5  . traZODone (DESYREL) 150 MG tablet TAKE 1 TABLET BY MOUTH AT BEDTIME. 90 tablet 1  . ciprofloxacin (CIPRO) 500 MG tablet Take 500 mg by mouth 2 (two) times daily.     No facility-administered medications prior to visit.     Allergies  Allergen Reactions  . Dilaudid [Hydromorphone Hcl] Other (See Comments)  Respiratory , cardiac     Review of Systems  Constitutional: Positive for malaise/fatigue. Negative for fever.  HENT: Negative for congestion.   Eyes: Negative for blurred vision.  Respiratory: Negative for shortness of breath.   Cardiovascular: Negative for chest pain, palpitations and leg swelling.  Gastrointestinal: Positive for abdominal pain and diarrhea. Negative for blood in stool, constipation, melena, nausea and vomiting.  Genitourinary: Negative for dysuria and frequency.  Musculoskeletal: Positive for myalgias. Negative for falls.  Skin: Negative for rash.  Neurological: Negative for dizziness, loss of consciousness and headaches.  Endo/Heme/Allergies: Negative for environmental allergies.  Psychiatric/Behavioral: Negative for depression. The patient is nervous/anxious.        Objective:    Physical Exam unable to obtain via phone  LMP  (LMP Unknown)  Wt Readings from Last 3 Encounters:  02/19/19 173 lb (78.5 kg)  02/05/19 173 lb 8 oz (78.7 kg)  12/17/18 173 lb 6.4 oz (78.7 kg)    Diabetic Foot Exam - Simple   No data filed     Lab Results  Component Value Date   WBC 12.4 (H) 11/11/2018   HGB 14.0 11/11/2018   HCT 41.1 11/11/2018   PLT 302.0 11/11/2018   GLUCOSE 131 (H) 01/29/2019   CHOL 196 10/22/2018   TRIG 149.0 10/22/2018   HDL 47.90 10/22/2018   LDLDIRECT 91.0 09/25/2018   LDLCALC 118 (H) 10/22/2018   ALT 27 11/11/2018   AST 26 11/11/2018   NA 140 01/29/2019   K 4.0 01/29/2019   CL 106 01/29/2019   CREATININE 0.73 01/29/2019   BUN 13 01/29/2019   CO2 24 01/29/2019    TSH 1.20 09/25/2018   INR 1.0 11/09/2008   HGBA1C 5.9 09/25/2018    Lab Results  Component Value Date   TSH 1.20 09/25/2018   Lab Results  Component Value Date   WBC 12.4 (H) 11/11/2018   HGB 14.0 11/11/2018   HCT 41.1 11/11/2018   MCV 83.3 11/11/2018   PLT 302.0 11/11/2018   Lab Results  Component Value Date   NA 140 01/29/2019   K 4.0 01/29/2019   CO2 24 01/29/2019   GLUCOSE 131 (H) 01/29/2019   BUN 13 01/29/2019   CREATININE 0.73 01/29/2019   BILITOT 1.1 11/11/2018   ALKPHOS 74 11/11/2018   AST 26 11/11/2018   ALT 27 11/11/2018   PROT 7.3 11/11/2018   ALBUMIN 4.0 11/11/2018   CALCIUM 9.2 01/29/2019   ANIONGAP 10 01/29/2019   GFR 78.82 11/11/2018   Lab Results  Component Value Date   CHOL 196 10/22/2018   Lab Results  Component Value Date   HDL 47.90 10/22/2018   Lab Results  Component Value Date   LDLCALC 118 (H) 10/22/2018   Lab Results  Component Value Date   TRIG 149.0 10/22/2018   Lab Results  Component Value Date   CHOLHDL 4 10/22/2018   Lab Results  Component Value Date   HGBA1C 5.9 09/25/2018       Assessment & Plan:   Problem List Items Addressed This Visit    Depression with anxiety    Her anxiety is heightened by the pandemic but she acknowledges her depression is some better and she is helping to take care of her elderly mother while continuing to work. No changes to therapy at this time.       Headache    With pain and tenderness over scalp she actually thought she was getting shingles because the pain was intense and burned but  then no lesions developed and the pain resolved after she took 3 doses of Prednisone 20 mg daily. Will check labs      Hyperlipidemia, mixed    Encouraged heart healthy diet, increase exercise, avoid trans fats, agrees to labs [facility]      Hyperglycemia    hgba1c acceptable, minimize simple carbs. Increase exercise as tolerated.       Vitamin D deficiency    Supplement and monitor       IBS (irritable bowel syndrome)    Has had long standing trouble with diarrhea of abdominal pain and symptoms have increased a bit recently again. Avoid offending foods, start probiotics. Do not eat large meals in late evening and consider raising head of bed. Use current meds as needed. Gets some relief with bentyl so she will start using that again and report worsening symptoms         I have discontinued Barnetta Chapel A. Cocuzza's ciprofloxacin. I am also having her maintain her Vitamin D3, Omega-3 Fatty Acids (FISH OIL PO), metroNIDAZOLE, pantoprazole, dicyclomine, metoprolol succinate, traZODone, temazepam, and LORazepam.  No orders of the defined types were placed in this encounter.   I discussed the assessment and treatment plan with the patient. The patient was provided an opportunity to ask questions and all were answered. The patient agreed with the plan and demonstrated an understanding of the instructions.   The patient was advised to call back or seek an in-person evaluation if the symptoms worsen or if the condition fails to improve as anticipated.  I provided 3535 minutes of non-face-to-face time during this encounter.   Penni Homans, MD

## 2019-06-10 NOTE — Assessment & Plan Note (Signed)
hgba1c acceptable, minimize simple carbs. Increase exercise as tolerated.  

## 2019-06-13 ENCOUNTER — Ambulatory Visit: Payer: 59 | Admitting: Family Medicine

## 2019-07-07 MED FILL — METOPROLOL SUCCINATE ER 100: 100 | 90 days supply | Qty: 180 | Fill #0

## 2019-07-07 MED FILL — LORazepam 1 MG TABS: 1 | 23 days supply | Qty: 70 | Fill #1

## 2019-07-07 MED FILL — TEMAZEPAM 30 MG CAPSULE: 30 | 30 days supply | Qty: 30 | Fill #3

## 2019-07-22 ENCOUNTER — Encounter: Payer: Self-pay | Admitting: Family Medicine

## 2019-08-01 MED FILL — LORazepam 1 MG TABS: 1 | 23 days supply | Qty: 70 | Fill #2

## 2019-08-01 MED FILL — TEMAZEPAM 30 MG CAPSULE: 30 | 30 days supply | Qty: 30 | Fill #4

## 2019-08-01 MED FILL — traZODone HCL 150 MG TABS: 150 | 90 days supply | Qty: 90 | Fill #1

## 2019-08-13 MED FILL — PANTOPRAZOLE SOD DR 40 MG T: 40 | 90 days supply | Qty: 180 | Fill #1

## 2019-08-28 ENCOUNTER — Other Ambulatory Visit (HOSPITAL_COMMUNITY): Payer: Self-pay | Admitting: Family Medicine

## 2019-08-28 DIAGNOSIS — Z1231 Encounter for screening mammogram for malignant neoplasm of breast: Secondary | ICD-10-CM

## 2019-09-01 ENCOUNTER — Other Ambulatory Visit: Payer: Self-pay | Admitting: Family Medicine

## 2019-09-01 MED FILL — TEMAZEPAM 30 MG CAPSULE: 30 | 30 days supply | Qty: 30 | Fill #5

## 2019-09-01 MED FILL — LORazepam 1 MG TABS: 1 | 23 days supply | Qty: 70 | Fill #0

## 2019-09-01 NOTE — Telephone Encounter (Signed)
Requesting:ativan  Contract:yes UDS:no ([employer] employee) Last OV:06/10/19 Next OV:n/a Last Refill:06/09/19  #[id number] Database:   Please advise

## 2019-10-02 ENCOUNTER — Other Ambulatory Visit: Payer: Self-pay | Admitting: Family Medicine

## 2019-10-02 MED FILL — LORazepam 1 MG TABS: 1 | 23 days supply | Qty: 70 | Fill #1

## 2019-10-02 NOTE — Telephone Encounter (Signed)
Requesting:Restoril  Contract:yes UDS:no [employer] employee Last OV:06/10/19 Next OV:n/a Last Refill:04/15/19  #[id number] Database:   Please advise

## 2019-10-03 MED FILL — TEMAZEPAM 30 MG CAPSULE: 30 | 30 days supply | Qty: 30 | Fill #0

## 2019-10-07 MED FILL — METOPROLOL SUCCINATE ER 100: 100 | 90 days supply | Qty: 180 | Fill #1

## 2019-10-09 ENCOUNTER — Other Ambulatory Visit (INDEPENDENT_AMBULATORY_CARE_PROVIDER_SITE_OTHER): Payer: 59

## 2019-10-09 ENCOUNTER — Encounter: Payer: Self-pay | Admitting: Physician Assistant

## 2019-10-09 ENCOUNTER — Other Ambulatory Visit: Payer: Self-pay

## 2019-10-09 ENCOUNTER — Ambulatory Visit (INDEPENDENT_AMBULATORY_CARE_PROVIDER_SITE_OTHER): Payer: 59 | Admitting: Physician Assistant

## 2019-10-09 VITALS — BP 140/70 | HR 88 | Temp 98.4°F | Ht 64.0 in | Wt 203.1 lb

## 2019-10-09 DIAGNOSIS — R197 Diarrhea, unspecified: Secondary | ICD-10-CM

## 2019-10-09 DIAGNOSIS — R109 Unspecified abdominal pain: Secondary | ICD-10-CM | POA: Diagnosis not present

## 2019-10-09 LAB — CBC WITH DIFFERENTIAL/PLATELET
Basophils Absolute: 0.1 10*3/uL (ref 0.0–0.1)
Basophils Relative: 0.8 % (ref 0.0–3.0)
Eosinophils Absolute: 0.1 10*3/uL (ref 0.0–0.7)
Eosinophils Relative: 1.7 % (ref 0.0–5.0)
HCT: 38.7 % (ref 36.0–46.0)
Hemoglobin: 13.1 g/dL (ref 12.0–15.0)
Lymphocytes Relative: 42.5 % (ref 12.0–46.0)
Lymphs Abs: 3.2 10*3/uL (ref 0.7–4.0)
MCHC: 33.9 g/dL (ref 30.0–36.0)
MCV: 84.4 fl (ref 78.0–100.0)
Monocytes Absolute: 0.5 10*3/uL (ref 0.1–1.0)
Monocytes Relative: 6.3 % (ref 3.0–12.0)
Neutro Abs: 3.7 10*3/uL (ref 1.4–7.7)
Neutrophils Relative %: 48.7 % (ref 43.0–77.0)
Platelets: 322 10*3/uL (ref 150.0–400.0)
RBC: 4.59 Mil/uL (ref 3.87–5.11)
RDW: 12.9 % (ref 11.5–15.5)
WBC: 7.6 10*3/uL (ref 4.0–10.5)

## 2019-10-09 LAB — COMPREHENSIVE METABOLIC PANEL
ALT: 24 U/L (ref 0–35)
AST: 16 U/L (ref 0–37)
Albumin: 4.4 g/dL (ref 3.5–5.2)
Alkaline Phosphatase: 73 U/L (ref 39–117)
BUN: 16 mg/dL (ref 6–23)
CO2: 26 mEq/L (ref 19–32)
Calcium: 9.8 mg/dL (ref 8.4–10.5)
Chloride: 105 mEq/L (ref 96–112)
Creatinine, Ser: 0.71 mg/dL (ref 0.40–1.20)
GFR: 83.63 mL/min (ref >60.00)
Glucose, Bld: 121 mg/dL — ABNORMAL HIGH (ref 70–99)
Potassium: 3.9 mEq/L (ref 3.5–5.1)
Sodium: 140 mEq/L (ref 135–145)
Total Bilirubin: 0.2 mg/dL (ref 0.2–1.2)
Total Protein: 7.4 g/dL (ref 6.0–8.3)

## 2019-10-09 MED ORDER — FAMOTIDINE 40 MG PO TABS: 40.0000 mg | ORAL_TABLET | Freq: Two times a day (BID) | ORAL | 5 refills | Status: DC

## 2019-10-09 MED ORDER — CHOLESTYRAMINE 4 G PO PACK: 4.0000 g | PACK | Freq: Every day | ORAL | 5 refills | Status: DC

## 2019-10-09 MED FILL — FAMOTIDINE 40 MG TABLET: 40 | 30 days supply | Qty: 60 | Fill #0

## 2019-10-09 MED FILL — CHOLESTYRAMINE PACKET: 4 | 30 days supply | Qty: 30 | Fill #0

## 2019-10-09 NOTE — Progress Notes (Signed)
Chief Complaint: Diarrhea, abdominal pain, GERD  HPI:    [name] Jensen is a 63 year old female with a past medical history as listed below, known to Dr. Fuller Plan, who was referred to me by [name] Lukes, MD for a complaint of diarrhea, abdominal pain, GERD.      01/29/2019 CT abdomen pelvis with contrast no evidence of acute abnormalities in the abdomen or pelvis.  Scattered colonic diverticulosis.  Near complete interval resolution of previously seen acute sigmoid diverticulitis.    02/19/2019 colonoscopy done for diarrhea and history of polyps,  One 5 mm polyp in transverse colon, one 4 mm polyp in the rectum, moderate diverticulosis and external hemorrhoids.  Pathology tubular adenomas and benign colonic mucosa.  Otherwise normal.  Repeat recommended in 7 years.    Today, the patient begins by saying that her reflux has never been under control.  She was switched from Omeprazole to Pantoprazole 40 mg twice daily when she last saw Dr. Fuller Plan but this has not been helping.  She continues with daily reflux symptoms.  Tells me that Zantac used to work well for her but this was discontinued.    Most concerning for the patient today is that she has developed diffuse lower abdominal pain over the past couple of weeks.  This is in addition to her chronic diarrheal stools.  Patient tells me she has always had trouble with fecal urgency as well, having sometimes up to 6-7 loose stools a day, initially was started on Dicyclomine 10 mg 3 times daily which did help, but stopped working earlier this year.  Most recently has been taking Kaopectate and Imodium in order to try and slow things down.  She did have a very pencil thin "amber-colored" stool this morning.  Patient tells me that she is not able to go many places as she has to go to the bathroom so urgently.  Currently it feels like she has been "kicked in the anus", like a "spasm".  Patient describes having a bowel movement about 10 minutes after eating anything.   Some of this is chronic for her but she never usually has abdominal pain.  Patient does have history of diverticulitis and tried taking 4 days of Cipro and Flagyl which did help her symptoms.    History of cholecystectomy 6 years ago.    Denies fever, chills or symptoms that awaken her from sleep.  Past Medical History:  Diagnosis Date   Adjustment reaction with anxiety and depression 06/12/2014   Anemia    h/o low ferritin   Anxiety    Asthma    environmental triggers   Chicken pox as a child   Cough 08/22/2015   Depression    Depression with anxiety 06/12/2014   Diverticulosis    Ehrlichiosis [id number]   Gallstones    GERD (gastroesophageal reflux disease)    Hair loss 03/28/2017   Hiatal hernia 2007   History of viral illness    Hyperlipidemia, mixed 04/11/2015   Insomnia 08/21/2017   Lactose intolerance 06/12/2014   Mumps as a child   Overweight 08/19/2014   Rectocele 06/12/2014   RLS (restless legs syndrome)    SCC (squamous cell carcinoma) 06/12/2014   Right arm removed 2011   Sessile colonic polyp    SVT (supraventricular tachycardia) ([place])    Tachycardia 06/12/2014   Thyroid disease    Vitamin D deficiency 08/22/2015    Past Surgical History:  Procedure Laterality Date   ABDOMINAL HYSTERECTOMY  11-2008   total  with cystocele, rectocele repair   APPENDECTOMY     CHOLECYSTECTOMY     COLONOSCOPY  2006   diverticulosis, hemorrhoids ([facility])   ESOPHAGOGASTRODUODENOSCOPY  2006   small hiatus hernia ([facility])   ESOPHAGOGASTRODUODENOSCOPY N/A 02/21/2018   Procedure: ESOPHAGOGASTRODUODENOSCOPY (EGD);  Surgeon: Rogene Houston, MD;  [facility];  Service: Endoscopy;  Laterality: N/A;  pt knows to arrive at 2:45   [place]     right arm scc   svt ablation     TONSILLECTOMY  1972   2ith adenoid   WISDOM TOOTH EXTRACTION  63 yrs old    Current Outpatient Medications  Medication Sig Dispense Refill    Cholecalciferol (VITAMIN D3) 5000 units CAPS Take 5,000 Units by mouth daily.      dicyclomine (BENTYL) 10 MG capsule Take 1 capsule (10 mg total) by mouth 3 (three) times daily before meals. 270 capsule 3   LORazepam (ATIVAN) 1 MG tablet TAKE 1/2 TO 1 TABLET BY MOUTH EVERY 8 HOURS AS NEEDED FOR ANXIETY 70 tablet 2   metoprolol succinate (TOPROL-XL) 100 MG 24 hr tablet TAKE 1 TABLET BY MOUTH 2 TIMES DAILY. TAKE WITH OR IMMEDIATELY FOLLOWING A MEAL. 180 tablet 3   metroNIDAZOLE (FLAGYL) 500 MG tablet Take 1 tablet (500 mg total) by mouth 3 (three) times daily. (Patient not taking: Reported on 02/05/2019) 21 tablet 0   Omega-3 Fatty Acids (FISH OIL PO) Take 2,000 mg by mouth 2 (two) times daily.     pantoprazole (PROTONIX) 40 MG tablet Take 1 tablet (40 mg total) by mouth 2 (two) times daily before a meal. 180 tablet 3   temazepam (RESTORIL) 30 MG capsule TAKE 1 CAPSULE BY MOUTH ONCE DAILY AT BEDTIME 30 capsule 5   traZODone (DESYREL) 150 MG tablet TAKE 1 TABLET BY MOUTH AT BEDTIME. 90 tablet 1   No current facility-administered medications for this visit.     Allergies as of 10/09/2019 - Review Complete 02/19/2019  Allergen Reaction Noted   Dilaudid [hydromorphone hcl] Other (See Comments) 09/23/2011    Family History  Problem Relation Age of Onset   Hypertension Mother    Hypertension Father    Hypertension Sister    Diabetes Sister        type 2   Fibromyalgia Sister    GI Bleed Maternal Grandmother    Atrial fibrillation Maternal Grandmother    Heart disease Maternal Grandfather    Diabetes Paternal Grandmother    Stroke Paternal Grandfather    Proteinuria Sister    Colon cancer Neg Hx    Esophageal cancer Neg Hx    Rectal cancer Neg Hx    Stomach cancer Neg Hx     Social History   Socioeconomic History   Marital status: Divorced    Spouse name: Not on file   Number of children: 2   Years of education: Not on file   Highest education level: Not  on file  Occupational History   Occupation: Programmer, multimedia: [employer]  Social Needs   Financial resource strain: Not on file   Food insecurity    Worry: Not on file    Inability: Not on file   Transportation needs    Medical: Not on file    Non-medical: Not on file  Tobacco Use   Smoking status: Never Smoker   Smokeless tobacco: Never Used  Substance and Sexual Activity   Alcohol use: No  Alcohol/week: 0.0 standard drinks   Drug use: No   Sexual activity: Not on file    Comment: lives by self with 4 dogs works at [employer], avoids dairy  Lifestyle   Physical activity    Days per week: Not on file    Minutes per session: Not on file   Stress: Not on file  Relationships   Social connections    Talks on phone: Not on file    Gets together: Not on file    Attends religious service: Not on file    Active member of club or organization: Not on file    Attends meetings of clubs or organizations: Not on file    Relationship status: Not on file   Intimate partner violence    Fear of current or ex partner: Not on file    Emotionally abused: Not on file    Physically abused: Not on file    Forced sexual activity: Not on file  Other Topics Concern   Not on file  Social History Narrative   Not on file    Review of Systems:    Constitutional: No weight loss, fever or chills Cardiovascular: No chest pain  Respiratory: No SOB Gastrointestinal: See HPI and otherwise negative   Physical Exam:  Vital signs: BP 140/70 (BP Location: Left Arm, Patient Position: Sitting, Cuff Size: Normal)    Pulse 88    Temp 98.4 F (36.9 C)    Ht 5\' 4"  (1.626 m)    Wt 203 lb 2 oz (92.1 kg)    LMP  (LMP Unknown)    BMI 34.87 kg/m   Constitutional:   Pleasant Caucasian female appears to be in NAD, Well developed, Well nourished, alert and cooperative Respiratory: Respirations even and unlabored. Lungs clear to auscultation bilaterally.   No wheezes, crackles, or rhonchi.    Cardiovascular: Normal S1, S2. No MRG. Regular rate and rhythm. No peripheral edema, cyanosis or pallor.  Gastrointestinal:  Soft, nondistended, moderate lower abdominal ttp. No rebound or guarding. Normal bowel sounds. No appreciable masses or hepatomegaly. Rectal:  Not performed.  Psychiatric: Demonstrates good judgement and reason without abnormal affect or behaviors.  No recent labs.  Assessment: 1.  Abdominal pain: Diffuse lower abdominal pain, new for the patient, history of diverticulitis in the past; consider diverticulitis versus IBS 2.  Diarrhea: Chronic for the patient, now with abdominal pain as above, is status post cholecystectomy; consider post-cholecystectomy+/-IBS+/-colitis 3.  GERD: Chronic for the patient, unchanged by Pantoprazole 40 mg twice daily; consider most likely chronic gastritis/GERD  Plan: 1.  Ordered CBC and CMP 2.  Ordered CT the abdomen and pelvis with contrast for further evaluation. 3.  Start the patient on Cholestyramine 4 g daily, can increase to twice daily if this is helping 4.  For the moment we will continue Bentyl 10 mg 3 times daily, also discussed increasing this to 4 times daily at 20 mg. 5.  Would recommend the patient add Pepcid 40 mg twice daily, every morning and nightly.  Prescribed #60 with 3 refills.  She should take this in addition to her Pantoprazole 40 mg twice daily. 6.  If above is not helpful for reflux can consider consultation with Dr. Bryan Lemma for TIF procedure. 7.  Discussed diarrhea with the patient, if labs and imaging are normal and do not show any inflammation, will see how Cholestyramine works for her.  If this is not working can increase Bentyl.  At that time would likely recommend repeat  stool studies including GI pathogen panel and O&P. 8.  Patient to follow in clinic per recommendations after imaging and labs above.  Ellouise Newer, PA-C [facility] 10/09/2019, 2:58 PM  Cc: [name] Lukes, MD

## 2019-10-09 NOTE — Patient Instructions (Addendum)
If you are age 61 or older, your body mass index should be between 23-30. Your Body mass index is 34.87 kg/m. If this is out of the aforementioned range listed, please consider follow up with your Primary Care Provider.  If you are age 18 or younger, your body mass index should be between 19-25. Your Body mass index is 34.87 kg/m. If this is out of the aformentioned range listed, please consider follow up with your Primary Care Provider.   Your provider has requested that you go to the basement level for lab work before leaving today. Press "B" on the elevator. The lab is located at the first door on the left as you exit the elevator.   We have sent the following medications to your pharmacy for you to pick up at your convenience:  1. Continue Pantoprazole twice daily.  2. Start Pepcid 40 mg twice daily   3. Start Cholestyramine 4G packet daily.   You have been scheduled for an CT abdomen and pelvics with contrast at [facility] Radiology (1st floor of hospital) on (tomorrow) 10/10/2019 at [place] should arrive 15 minutes prior to your appointment time for registration. Please follow the written instructions below on the day of your exam:  WARNING: IF YOU ARE ALLERGIC TO IODINE/X-RAY DYE, PLEASE NOTIFY RADIOLOGY IMMEDIATELY AT [phone]! YOU WILL BE GIVEN A 13 HOUR PREMEDICATION PREP.  1) Do not eat or drink anything after 4am (4 hours prior to your test) 2) You have been given 2 bottles of oral contrast to drink. The solution may taste better if refrigerated, but do NOT add ice or any other liquid to this solution. Shake well before drinking.    Drink 1 bottle of contrast @ 6am (2 hours prior to your exam)  Drink 1 bottle of contrast @ 7am (1 hour prior to your exam)  You may take any medications as prescribed with a small amount of water, if necessary. If you take any of the following medications: METFORMIN, GLUCOPHAGE, GLUCOVANCE, AVANDAMET, RIOMET, FORTAMET, [place] MET,  JANUMET, GLUMETZA or METAGLIP, you MAY be asked to HOLD this medication 48 hours AFTER the exam.  The purpose of you drinking the oral contrast is to aid in the visualization of your intestinal tract. The contrast solution may cause some diarrhea. Depending on your individual set of symptoms, you may also receive an intravenous injection of x-ray contrast/dye. Plan on being at [facility] for 30 minutes or longer, depending on the type of exam you are having performed.  This test typically takes 30-45 minutes to complete.  If you have any questions regarding your exam or if you need to reschedule, you may call the CT department at [phone] between the hours of 8:00 am and 5:00 pm, Monday-Friday.  ________________________________________________________________________

## 2019-10-09 NOTE — Addendum Note (Signed)
Addended by: Horris Latino on: 10/09/2019 03:53 PM   Modules accepted: Orders

## 2019-10-10 ENCOUNTER — Ambulatory Visit (HOSPITAL_COMMUNITY)
Admission: RE | Admit: 2019-10-10 | Discharge: 2019-10-10 | Disposition: A | Discharge status: Home / Self Care | Payer: 59 | Source: Ambulatory Visit | Attending: Physician Assistant | Admitting: Physician Assistant

## 2019-10-10 ENCOUNTER — Encounter (HOSPITAL_COMMUNITY): Payer: Self-pay

## 2019-10-10 DIAGNOSIS — R197 Diarrhea, unspecified: Secondary | ICD-10-CM | POA: Diagnosis not present

## 2019-10-10 MED ORDER — SODIUM CHLORIDE (PF) 0.9 % IJ SOLN
INTRAMUSCULAR | Status: AC
  Filled 2019-10-10: qty 50

## 2019-10-10 MED ORDER — IOHEXOL 300 MG/ML  SOLN
100.0000 mL | Freq: Once | INTRAMUSCULAR | Status: AC | PRN
  Administered 2019-10-10: 08:00:00 100 mL via INTRAVENOUS

## 2019-10-10 NOTE — Progress Notes (Signed)
Reviewed and agree with management plan.  Clotiel Troop T. Adithi Gammon, MD FACG [facility]  

## 2019-10-13 ENCOUNTER — Encounter (HOSPITAL_COMMUNITY)
Admission: RE | Admit: 2019-10-13 | Discharge: 2019-10-13 | Disposition: A | Discharge status: Home / Self Care | Payer: 59 | Source: Ambulatory Visit | Attending: Family Medicine | Admitting: Family Medicine

## 2019-10-13 ENCOUNTER — Other Ambulatory Visit: Payer: Self-pay

## 2019-10-13 DIAGNOSIS — Z1231 Encounter for screening mammogram for malignant neoplasm of breast: Secondary | ICD-10-CM | POA: Insufficient documentation

## 2019-10-13 MED ORDER — METRONIDAZOLE 500 MG PO TABS: 500.0000 mg | ORAL_TABLET | Freq: Three times a day (TID) | ORAL | 0 refills | Status: DC

## 2019-10-13 MED ORDER — CIPROFLOXACIN HCL 500 MG PO TABS: 500.0000 mg | ORAL_TABLET | Freq: Two times a day (BID) | ORAL | 0 refills | Status: DC

## 2019-10-13 MED FILL — METRONIDAZOLE 500 MG TABS: 500 | 10 days supply | Qty: 30 | Fill #0

## 2019-10-13 MED FILL — CIPROFLOXACIN HCL 500 MG TA: 500 | 10 days supply | Qty: 20 | Fill #0

## 2019-10-15 ENCOUNTER — Telehealth: Payer: Self-pay | Admitting: Physician Assistant

## 2019-10-15 ENCOUNTER — Ambulatory Visit (HOSPITAL_COMMUNITY): Payer: 59

## 2019-10-15 NOTE — Telephone Encounter (Signed)
Called patient back and she says the cholestyramine she was put on makes her sick on her stomach and she vomited it back up today. Said the pharmacist said an alternative might be Welchol since it comes in pill form. She would like to change to that if possible

## 2019-10-16 ENCOUNTER — Other Ambulatory Visit: Payer: Self-pay

## 2019-10-16 MED ORDER — COLESEVELAM HCL 625 MG PO TABS: 1875.0000 mg | ORAL_TABLET | Freq: Two times a day (BID) | ORAL | 2 refills | Status: DC

## 2019-10-16 MED FILL — COLESEVELAM HCL 625 MG TABS: 625 | 30 days supply | Qty: 180 | Fill #0

## 2019-10-16 NOTE — Telephone Encounter (Signed)
That is fine- Welchol-1875 mg PO BID with meals. #60, refill x2- thanks-JLL

## 2019-10-16 NOTE — Telephone Encounter (Signed)
Sent order to patient's pharmacy for [facility] and called and  gave instructions for taking.

## 2019-11-05 ENCOUNTER — Other Ambulatory Visit: Payer: Self-pay | Admitting: Family Medicine

## 2019-11-05 MED FILL — TEMAZEPAM 30 MG CAPSULE: 30 | 30 days supply | Qty: 30 | Fill #1

## 2019-11-05 MED FILL — LORazepam 1 MG TABS: 1 | 23 days supply | Qty: 70 | Fill #2

## 2019-11-06 MED FILL — traZODone HCL 150 MG TABS: 150 | 90 days supply | Qty: 90 | Fill #0

## 2019-12-02 MED FILL — DICYCLOMINE 10 MG CAPSULE: 10 | 90 days supply | Qty: 270 | Fill #1

## 2019-12-02 MED FILL — TEMAZEPAM 30 MG CAPSULE: 30 | 30 days supply | Qty: 30 | Fill #2

## 2019-12-03 MED FILL — FAMOTIDINE 40 MG TABLET: 40 | 30 days supply | Qty: 60 | Fill #1

## 2019-12-13 ENCOUNTER — Other Ambulatory Visit: Payer: Self-pay | Admitting: Physician Assistant

## 2019-12-13 ENCOUNTER — Telehealth: Payer: Self-pay | Admitting: Family Medicine

## 2019-12-15 MED FILL — COLESEVELAM HCL 625 MG TABS: 625 | 30 days supply | Qty: 180 | Fill #0

## 2019-12-15 NOTE — Telephone Encounter (Signed)
Requesting: Ativan  Contract: n/a UDS: n/a Last OV:06/10/19 Next OV:n/a Last Refill: 09/01/19  #[id number] Database:   Please advise

## 2019-12-15 NOTE — Telephone Encounter (Signed)
Please schedule patient a virtual visit

## 2019-12-15 NOTE — Telephone Encounter (Signed)
Approved #70 but will need appt for further refills due to 6 months since last visit

## 2019-12-16 NOTE — Telephone Encounter (Signed)
Pt works for [employer] and has been working a lot and everyone around her keeps testing + for COVID-19. Pt states "I'm losing my mind" and noted she is taking more lorazepam than originally prescribed. Pt is off 1/14 and asking if Dr. Charlett Blake can fit in for a telephone/video appt. Please advise.

## 2019-12-23 ENCOUNTER — Other Ambulatory Visit: Payer: Self-pay | Admitting: Family Medicine

## 2019-12-23 NOTE — Addendum Note (Signed)
Addended by: [name] A on: 12/23/2019 04:00 PM   Modules accepted: Orders

## 2019-12-23 NOTE — Telephone Encounter (Signed)
D/c ativan

## 2019-12-23 NOTE — Telephone Encounter (Signed)
Pt called in and is now out of Ativan. She is scheduled for 2/22 appointment and thought 1 month was being sent in. Pt works for [employer] and under a lot of stress. She is now out of Ativan. Pt asking that refill be sent in. She is off 1/20 and will pick up in the morning.  Please make a note on the rx "DO NOT SHIP/MAIL MEDICATION"  [place].

## 2019-12-23 NOTE — Telephone Encounter (Signed)
Requesting:ativan  Contract: yes UDS:n/a Last OV:06/10/19 Next OV:01/26/20 Database:   Please advise

## 2019-12-24 MED FILL — LORAZEPAM 1 MG TABS: 1 | 23 days supply | Qty: 70 | Fill #0

## 2019-12-25 MED FILL — METOPROLOL SUCCINATE ER 100: 100 | 90 days supply | Qty: 180 | Fill #2

## 2020-01-08 MED FILL — TEMAZEPAM 30 MG CAPSULE: 30 | 30 days supply | Qty: 30 | Fill #3

## 2020-01-14 ENCOUNTER — Encounter: Payer: Self-pay | Admitting: Physician Assistant

## 2020-01-14 ENCOUNTER — Other Ambulatory Visit (INDEPENDENT_AMBULATORY_CARE_PROVIDER_SITE_OTHER): Payer: 59

## 2020-01-14 ENCOUNTER — Ambulatory Visit: Payer: 59 | Admitting: Physician Assistant

## 2020-01-14 VITALS — BP 138/74 | HR 90 | Temp 98.1°F | Ht 64.0 in | Wt 194.2 lb

## 2020-01-14 DIAGNOSIS — R1011 Right upper quadrant pain: Secondary | ICD-10-CM

## 2020-01-14 DIAGNOSIS — R110 Nausea: Secondary | ICD-10-CM

## 2020-01-14 DIAGNOSIS — K5792 Diverticulitis of intestine, part unspecified, without perforation or abscess without bleeding: Secondary | ICD-10-CM

## 2020-01-14 DIAGNOSIS — R1031 Right lower quadrant pain: Secondary | ICD-10-CM

## 2020-01-14 LAB — CBC WITH DIFFERENTIAL/PLATELET
Basophils Absolute: 0 10*3/uL (ref 0.0–0.1)
Basophils Relative: 0.6 % (ref 0.0–3.0)
Eosinophils Absolute: 0.1 10*3/uL (ref 0.0–0.7)
Eosinophils Relative: 1.5 % (ref 0.0–5.0)
HCT: 38.2 % (ref 36.0–46.0)
Hemoglobin: 12.8 g/dL (ref 12.0–15.0)
Lymphocytes Relative: 41.3 % (ref 12.0–46.0)
Lymphs Abs: 3.2 10*3/uL (ref 0.7–4.0)
MCHC: 33.4 g/dL (ref 30.0–36.0)
MCV: 84.7 fl (ref 78.0–100.0)
Monocytes Absolute: 0.5 10*3/uL (ref 0.1–1.0)
Monocytes Relative: 7.1 % (ref 3.0–12.0)
Neutro Abs: 3.9 10*3/uL (ref 1.4–7.7)
Neutrophils Relative %: 49.5 % (ref 43.0–77.0)
Platelets: 291 10*3/uL (ref 150.0–400.0)
RBC: 4.51 Mil/uL (ref 3.87–5.11)
RDW: 13.7 % (ref 11.5–15.5)
WBC: 7.8 10*3/uL (ref 4.0–10.5)

## 2020-01-14 LAB — LIPASE: Lipase: 38 U/L (ref 11.0–59.0)

## 2020-01-14 LAB — COMPREHENSIVE METABOLIC PANEL
ALT: 33 U/L (ref 0–35)
AST: 23 U/L (ref 0–37)
Albumin: 4.2 g/dL (ref 3.5–5.2)
Alkaline Phosphatase: 67 U/L (ref 39–117)
BUN: 15 mg/dL (ref 6–23)
CO2: 28 mEq/L (ref 19–32)
Calcium: 9.5 mg/dL (ref 8.4–10.5)
Chloride: 108 mEq/L (ref 96–112)
Creatinine, Ser: 0.76 mg/dL (ref 0.40–1.20)
GFR: 77.25 mL/min (ref >60.00)
Glucose, Bld: 107 mg/dL — ABNORMAL HIGH (ref 70–99)
Potassium: 4.1 mEq/L (ref 3.5–5.1)
Sodium: 141 mEq/L (ref 135–145)
Total Bilirubin: 0.3 mg/dL (ref 0.2–1.2)
Total Protein: 7.1 g/dL (ref 6.0–8.3)

## 2020-01-14 MED ORDER — ONDANSETRON HCL 4 MG PO TABS: 4.0000 mg | ORAL_TABLET | Freq: Four times a day (QID) | ORAL | 1 refills | Status: DC | PRN

## 2020-01-14 MED FILL — ONDANSETRON HCL 4 MG TABLET: 4 | 10 days supply | Qty: 40 | Fill #0

## 2020-01-14 NOTE — Progress Notes (Signed)
Subjective:    Patient ID: [name] Jensen, female    DOB: 1956/08/19, 64 y.o.   MRN: [id number]  HPI [name] is a pleasant 64 year old white female, established with Dr. Fuller Plan, who comes in today with acute onset of lower abdominal pain, nausea, fever, onset about 4 days ago.  She has history of diverticulitis and says these symptoms are similar however her pain is now in a different location than her usual and she has also been experiencing nausea which she has not had in the past with diverticulitis. She says she started noticing left lower quadrant abdominal pain this past Saturday followed by nausea, chills and temp to 100.4.  She had Cipro and Flagyl at home so started this on Sunday.  She started to feel a bit better but has remained nauseated and now has had some migration of pain up into the right mid and upper abdomen.  She continues to have some low-grade temp. She has also had chronic problems with diarrhea post cholecystectomy and had been started on WelChol which was working very well.  She has been off this over the past few days because of the nausea so has also been experiencing diarrhea. Last colonoscopy was done in March 2020 with 2 small polyps removed both were tubular adenomas noted to have moderate left colon diverticulosis. She had CT done in November 2020 also with an episode of diverticulitis which did show sigmoid diverticulosis with mild diverticulitis. Patient has family history of colon cancer in her mother, is status post cholecystectomy, appendectomy, hysterectomy has history of IBS multinodular goiter obstructive sleep apnea and asthma.  Review of Systems Pertinent positive and negative review of systems were noted in the above HPI section.  All other review of systems was otherwise negative.  Outpatient Encounter Medications as of 01/14/2020  Medication Sig  . Cholecalciferol (VITAMIN D3) 5000 units CAPS Take 5,000 Units by mouth daily.   . ciprofloxacin  (CIPRO) 500 MG tablet Take 500 mg by mouth 2 (two) times daily.  . colesevelam (WELCHOL) 625 MG tablet TAKE 3 TABLETS BY MOUTH TWICE DAILY WITH MEALS  . dicyclomine (BENTYL) 10 MG capsule Take 1 capsule (10 mg total) by mouth 3 (three) times daily before meals.  . famotidine (PEPCID) 40 MG tablet Take 1 tablet (40 mg total) by mouth 2 (two) times daily.  Marland Kitchen LORazepam (ATIVAN) 1 MG tablet TAKE 1/2 TO 1 TABLET BY MOUTH EVERY 8 HOURS AS NEEDED FOR ANXIETY  . metoprolol succinate (TOPROL-XL) 100 MG 24 hr tablet TAKE 1 TABLET BY MOUTH 2 TIMES DAILY. TAKE WITH OR IMMEDIATELY FOLLOWING A MEAL.  Marland Kitchen metroNIDAZOLE (FLAGYL) 500 MG tablet Take 1 tablet (500 mg total) by mouth 3 (three) times daily.  . Omega-3 Fatty Acids (FISH OIL PO) Take 2,000 mg by mouth 2 (two) times daily.  . pantoprazole (PROTONIX) 40 MG tablet Take 1 tablet (40 mg total) by mouth 2 (two) times daily before a meal.  . temazepam (RESTORIL) 30 MG capsule TAKE 1 CAPSULE BY MOUTH ONCE DAILY AT BEDTIME  . traZODone (DESYREL) 150 MG tablet TAKE 1 TABLET BY MOUTH AT BEDTIME.  Marland Kitchen ondansetron (ZOFRAN) 4 MG tablet Take 1 tablet (4 mg total) by mouth every 6 (six) hours as needed for nausea or vomiting.  . [DISCONTINUED] cholestyramine (QUESTRAN) 4 g packet Take 1 packet (4 g total) by mouth daily.  . [DISCONTINUED] ciprofloxacin (CIPRO) 500 MG tablet Take 1 tablet (500 mg total) by mouth 2 (two) times daily.  . [  DISCONTINUED] metroNIDAZOLE (FLAGYL) 500 MG tablet Take 1 tablet (500 mg total) by mouth 3 (three) times daily.   No facility-administered encounter medications on file as of 01/14/2020.   Allergies  Allergen Reactions  . Dilaudid [Hydromorphone Hcl] Other (See Comments)    Respiratory , cardiac    Patient Active Problem List   Diagnosis Date Noted  . Skin lesion of scalp 03/18/2019  . IBS (irritable bowel syndrome) 03/18/2019  . Diverticulitis 12/23/2018  . Insomnia 08/21/2017  . Hair loss 03/28/2017  . Acute pain of right knee  03/28/2017  . Vitamin D deficiency 08/22/2015  . Cough 08/22/2015  . Left-sided thoracic back pain 04/14/2015  . Hyperlipidemia, mixed 04/11/2015  . Hyperglycemia 04/11/2015  . Abdominal pain 10/07/2014  . Family history of colon cancer in mother per 2006 GI note Dr. Watt Climes 10/04/2014  . Diarrhea 10/04/2014  . Preventative health care 08/19/2014  . Benign paroxysmal positional vertigo 08/19/2014  . Obesity 08/19/2014  . Multinodular goiter (nontoxic) 06/29/2014  . Cystocele 06/12/2014  . Rectocele 06/12/2014  . SCC (squamous cell carcinoma) 06/12/2014  . Lactose intolerance 06/12/2014  . OSA (obstructive sleep apnea) 06/12/2014  . Tachycardia 06/12/2014  . Depression with anxiety 06/12/2014  . Headache 06/12/2014  . GERD (gastroesophageal reflux disease) 06/12/2014  . Euthyroid Hashimoto's thyroiditis   . Chicken pox   . Mumps   . Anemia   . Asthma   . 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 RESTLESS LEG SYNDROME 02/04/2009   Social History   Socioeconomic History  . Marital status: Divorced    Spouse name: Not on file  . Number of children: 2  . Years of education: Not on file  . Highest education level: Not on file  Occupational History  . Occupation: Programmer, multimedia: [employer]  Tobacco Use  . Smoking status: Never Smoker  . Smokeless tobacco: Never Used  Substance and Sexual Activity  . Alcohol use: No    Alcohol/week: 0.0 standard drinks  . Drug use: No  . Sexual activity: Not on file    Comment: lives by self with 4 dogs works at [employer], avoids dairy  Other Topics Concern  . Not on file  Social History Narrative  . Not on file   Social Determinants of Health   Financial Resource Strain:   . Difficulty of Paying Living Expenses: Not on file  Food Insecurity:   . Worried About Charity fundraiser in the Last Year: Not on file  . Ran Out of Food in the Last Year: Not on file  Transportation Needs:   . Lack of Transportation  (Medical): Not on file  . Lack of Transportation (Non-Medical): Not on file  Physical Activity:   . Days of Exercise per Week: Not on file  . Minutes of Exercise per Session: Not on file  Stress:   . Feeling of Stress : Not on file  Social Connections:   . Frequency of Communication with Friends and Family: Not on file  . Frequency of Social Gatherings with Friends and Family: Not on file  . Attends Religious Services: Not on file  . Active Member of Clubs or Organizations: Not on file  . Attends Archivist Meetings: Not on file  . Marital Status: Not on file  Intimate Partner Violence:   . Fear of Current or Ex-Partner: Not on file  . Emotionally Abused: Not on file  . Physically Abused: Not on file  .  Sexually Abused: Not on file    Ms. Justus's family history includes Atrial fibrillation in her maternal grandmother; Diabetes in her paternal grandmother and sister; Fibromyalgia in her sister; GI Bleed in her maternal grandmother; Heart disease in her maternal grandfather; Hypertension in her father, mother, and sister; Proteinuria in her sister; Stroke in her paternal grandfather.      Objective:    Vitals:   01/14/20 1407  BP: 138/74  Pulse: 90  Temp: 98.1 F (36.7 C)    Physical Exam Well-developed well-nourished female in no acute distress.  Height, Weight 194, BMI 33.3 HEENT; nontraumatic normocephalic, EOMI, PE RR LA, sclera anicteric. Oropharynx; not examined today Neck; supple, no JVD Cardiovascular; regular rate and rhythm with S1-S2, no murmur rub or gallop Pulmonary; Clear bilaterally Abdomen; soft, she is tender in the deep left lower quadrant and suprapubic area also mildly in the right lower quadrant and right upper quadrant, nondistended, no palpable mass or hepatosplenomegaly, bowel sounds are active Rectal; not done today Skin; benign exam, no jaundice rash or appreciable lesions Extremities; no clubbing cyanosis or edema skin warm and  dry Neuro/Psych; alert and oriented x4, grossly nonfocal mood and affect appropriate       Assessment & Plan:   #38 64 year old female with history of diverticulitis, last episode November 2020 who comes in with 5-day history of recurrent lower abdominal pain, chills and fever.  She started herself on Cipro and Flagyl 3 days ago, had some improvement in abdominal pain but has now had migration of pain into the right abdomen and persistent nausea which is unusual for her episodes of diverticulitis.  I suspect she has had recurrent diverticulitis, etiology of right-sided abdominal pain is not entirely clear, rule out spasm, rule out mild ileus, rule out other intra-abdominal inflammatory process.  #2 history of chronic diarrhea/bile salt induced status post cholecystectomy-good response to WelChol #3 that is post appendectomy 4.  IBS 5.  History of adenomatous colon polyps-up-to-date with colonoscopy last done March 2020 #6 asthma/obstructive sleep apnea  Plan; patient will complete a 10-day course of Cipro 500 mg p.o. twice daily and metronidazole 500 mg p.o. 3 times daily Full liquid to very soft diet until symptoms improve We will send prescription for Zofran 4 mg every 6 hours as needed for nausea She will restart WelChol when nausea resolves. We will check this CBC, c-Met, lipase and UA today She will need CT of the abdomen and pelvis if symptoms do not improve over the next day or 2 and/or if any concerning labs.  Jackelynn Hosie S Gyanna Jarema PA-C 01/14/2020   Cc: [name], MD

## 2020-01-14 NOTE — Patient Instructions (Addendum)
If you are age 62 or older, your body mass index should be between 23-30. Your Body mass index is 33.33 kg/m. If this is out of the aforementioned range listed, please consider follow up with your Primary Care Provider.  If you are age 57 or younger, your body mass index should be between 19-25. Your Body mass index is 33.33 kg/m. If this is out of the aformentioned range listed, please consider follow up with your Primary Care Provider.   Your provider has requested that you go to the basement level for lab work before leaving today. Press "B" on the elevator. The lab is located at the first door on the left as you exit the elevator.  We have sent the following medications to your pharmacy for you to pick up at your convenience: Zofran 4 mg every 6 hours as needed for nausea.   Finish Cipro and Flagyl.   Call tomorrow with and update and ask to speak with [facility].

## 2020-01-15 ENCOUNTER — Telehealth: Payer: Self-pay | Admitting: Physician Assistant

## 2020-01-15 LAB — URINALYSIS, ROUTINE W REFLEX MICROSCOPIC
Bilirubin Urine: NEGATIVE
Hgb urine dipstick: NEGATIVE
Ketones, ur: NEGATIVE
Leukocytes,Ua: NEGATIVE
Nitrite: NEGATIVE
RBC / HPF: NONE SEEN (ref 0–?)
Specific Gravity, Urine: >=1.03 — AB (ref 1.000–1.030)
Total Protein, Urine: NEGATIVE
Urine Glucose: NEGATIVE
Urobilinogen, UA: 0.2 (ref 0.0–1.0)
pH: 5.5 (ref 5.0–8.0)

## 2020-01-15 NOTE — Telephone Encounter (Signed)
Called the patient back. No answer.  Left a message on her voicemail to call when she is available.

## 2020-01-15 NOTE — Progress Notes (Signed)
Reviewed and agree with management plan.  Demarus Latterell T. Tehya Leath, MD FACG [facility]  

## 2020-01-26 ENCOUNTER — Ambulatory Visit: Payer: 59 | Admitting: Family Medicine

## 2020-01-26 ENCOUNTER — Other Ambulatory Visit: Payer: Self-pay

## 2020-01-26 ENCOUNTER — Encounter: Payer: Self-pay | Admitting: Family Medicine

## 2020-01-26 VITALS — BP 134/86 | HR 85 | Temp 97.4°F | Ht 64.0 in | Wt 192.1 lb

## 2020-01-26 DIAGNOSIS — R739 Hyperglycemia, unspecified: Secondary | ICD-10-CM

## 2020-01-26 DIAGNOSIS — R109 Unspecified abdominal pain: Secondary | ICD-10-CM | POA: Diagnosis not present

## 2020-01-26 DIAGNOSIS — E559 Vitamin D deficiency, unspecified: Secondary | ICD-10-CM

## 2020-01-26 DIAGNOSIS — K5792 Diverticulitis of intestine, part unspecified, without perforation or abscess without bleeding: Secondary | ICD-10-CM

## 2020-01-26 DIAGNOSIS — F418 Other specified anxiety disorders: Secondary | ICD-10-CM

## 2020-01-26 DIAGNOSIS — E782 Mixed hyperlipidemia: Secondary | ICD-10-CM | POA: Diagnosis not present

## 2020-01-26 DIAGNOSIS — R002 Palpitations: Secondary | ICD-10-CM

## 2020-01-26 DIAGNOSIS — R350 Frequency of micturition: Secondary | ICD-10-CM

## 2020-01-26 DIAGNOSIS — G4700 Insomnia, unspecified: Secondary | ICD-10-CM | POA: Diagnosis not present

## 2020-01-26 DIAGNOSIS — E063 Autoimmune thyroiditis: Secondary | ICD-10-CM | POA: Diagnosis not present

## 2020-01-26 LAB — HEMOGLOBIN A1C: Hgb A1c MFr Bld: 5.9 % (ref 4.6–6.5)

## 2020-01-26 LAB — CBC
HCT: 42 % (ref 36.0–46.0)
Hemoglobin: 13.9 g/dL (ref 12.0–15.0)
MCHC: 33 g/dL (ref 30.0–36.0)
MCV: 85.7 fl (ref 78.0–100.0)
Platelets: 309 10*3/uL (ref 150.0–400.0)
RBC: 4.9 Mil/uL (ref 3.87–5.11)
RDW: 13.8 % (ref 11.5–15.5)
WBC: 7 10*3/uL (ref 4.0–10.5)

## 2020-01-26 LAB — URINALYSIS
Bilirubin Urine: NEGATIVE
Ketones, ur: NEGATIVE
Leukocytes,Ua: NEGATIVE
Nitrite: NEGATIVE
Specific Gravity, Urine: >=1.03 — AB (ref 1.000–1.030)
Total Protein, Urine: NEGATIVE
Urine Glucose: NEGATIVE
Urobilinogen, UA: 0.2 (ref 0.0–1.0)
pH: 5.5 (ref 5.0–8.0)

## 2020-01-26 LAB — TSH: TSH: 1.25 u[IU]/mL (ref 0.35–4.50)

## 2020-01-26 LAB — T4, FREE: Free T4: 1.04 ng/dL (ref 0.60–1.60)

## 2020-01-26 LAB — VITAMIN D 25 HYDROXY (VIT D DEFICIENCY, FRACTURES): VITD: 42.7 ng/mL (ref 30.00–100.00)

## 2020-01-26 MED ORDER — LORAZEPAM 1 MG PO TABS: 0.5000 mg | ORAL_TABLET | Freq: Three times a day (TID) | ORAL | 2 refills | Status: DC | PRN

## 2020-01-26 MED ORDER — TEMAZEPAM 30 MG PO CAPS: ORAL_CAPSULE | ORAL | 5 refills | Status: DC

## 2020-01-26 MED FILL — LORazepam 1 MG TABS: 1 | 23 days supply | Qty: 70 | Fill #0

## 2020-01-26 NOTE — Patient Instructions (Signed)
Omron Blood Pressure cuff, upper arm, want BP 100-140/60-90 Pulse oximeter, want oxygen in 90s  Weekly vitals  Take Multivitamin with minerals, selenium Vitamin D 1000-2000 IU daily Probiotic with lactobacillus and bifidophilus Asprin EC 81 mg daily  Melatonin 2-5 mg at bedtime  [email or web address] 

## 2020-01-26 NOTE — Assessment & Plan Note (Signed)
Encouraged heart healthy diet, increase exercise, avoid trans fats, consider a krill oil cap daily 

## 2020-01-26 NOTE — Assessment & Plan Note (Signed)
Supplement and monitor 

## 2020-01-26 NOTE — Assessment & Plan Note (Signed)
hgba1c acceptable, minimize simple carbs. Increase exercise as tolerated. Continue current meds 

## 2020-01-26 NOTE — Assessment & Plan Note (Signed)
Encouraged good sleep hygiene such as dark, quiet room. No blue/green glowing lights such as computer screens in bedroom. No alcohol or stimulants in evening. Cut down on caffeine as able. Regular exercise is helpful but not just prior to bed time.  

## 2020-01-26 NOTE — Assessment & Plan Note (Signed)
Is waking up around 4 am every morning. With racing heart beat and anxiety and hot sweats. Consider low sugars vs hot flashes vs cardiac arrthymia

## 2020-01-27 ENCOUNTER — Encounter: Payer: Self-pay | Admitting: Family Medicine

## 2020-01-27 LAB — URINE CULTURE
MICRO NUMBER:: 10172829
SPECIMEN QUALITY:: ADEQUATE

## 2020-01-27 LAB — COMPREHENSIVE METABOLIC PANEL
ALT: 36 U/L — ABNORMAL HIGH (ref 0–35)
AST: 23 U/L (ref 0–37)
Albumin: 4.8 g/dL (ref 3.5–5.2)
Alkaline Phosphatase: 80 U/L (ref 39–117)
BUN: 13 mg/dL (ref 6–23)
CO2: 24 mEq/L (ref 19–32)
Calcium: 9.9 mg/dL (ref 8.4–10.5)
Chloride: 105 mEq/L (ref 96–112)
Creatinine, Ser: 0.72 mg/dL (ref 0.40–1.20)
GFR: 82.21 mL/min (ref >60.00)
Glucose, Bld: 121 mg/dL — ABNORMAL HIGH (ref 70–99)
Potassium: 4.2 mEq/L (ref 3.5–5.1)
Sodium: 140 mEq/L (ref 135–145)
Total Bilirubin: 0.5 mg/dL (ref 0.2–1.2)
Total Protein: 7.4 g/dL (ref 6.0–8.3)

## 2020-01-27 LAB — THYROID PEROXIDASE ANTIBODY: Thyroperoxidase Ab SerPl-aCnc: 1 IU/mL (ref <9)

## 2020-01-27 LAB — INSULIN, RANDOM: Insulin: 34.9 u[IU]/mL — ABNORMAL HIGH

## 2020-01-28 ENCOUNTER — Other Ambulatory Visit (INDEPENDENT_AMBULATORY_CARE_PROVIDER_SITE_OTHER): Payer: 59

## 2020-01-28 ENCOUNTER — Other Ambulatory Visit: Payer: Self-pay | Admitting: Family Medicine

## 2020-01-28 ENCOUNTER — Encounter: Payer: Self-pay | Admitting: Family Medicine

## 2020-01-28 DIAGNOSIS — E785 Hyperlipidemia, unspecified: Secondary | ICD-10-CM | POA: Diagnosis not present

## 2020-01-28 LAB — LIPID PANEL
Cholesterol: 266 mg/dL — ABNORMAL HIGH (ref 0–200)
HDL: 52.9 mg/dL (ref >39.00)
NonHDL: 212.67
Total CHOL/HDL Ratio: 5
Triglycerides: 372 mg/dL — ABNORMAL HIGH (ref 0.0–149.0)
VLDL: 74.4 mg/dL — ABNORMAL HIGH (ref 0.0–40.0)

## 2020-01-28 LAB — LDL CHOLESTEROL, DIRECT: Direct LDL: 136 mg/dL

## 2020-01-28 MED ORDER — METFORMIN HCL 500 MG PO TABS: 500.0000 mg | ORAL_TABLET | Freq: Every day | ORAL | 3 refills | Status: DC

## 2020-01-28 MED FILL — metFORMIN HCL 500 MG TABS: 500 | 30 days supply | Qty: 30 | Fill #0

## 2020-01-28 NOTE — Progress Notes (Signed)
Subjective:    Patient ID: [name] Jensen, female    DOB: 1956/08/23, 64 y.o.   MRN: [id number]  Chief Complaint  Patient presents with  . Follow-up    problems with diverticulitis/saw GI last week    HPI Patient is in today for follow up on chronic medical concerns. No recent febrile illness or hospitalizations. She has continued to work during the pandemic and she is doing well. No recent febrile illness or hospitalizations. Denies CP/palp/SOB/HA/congestion/fevers/GI or GU c/o. Taking meds as prescribed  Past Medical History:  Diagnosis Date  . Adjustment reaction with anxiety and depression 06/12/2014  . Anemia    h/o low ferritin  . Anxiety   . Asthma    environmental triggers  . Chicken pox as a child  . Cough 08/22/2015  . Depression   . Depression with anxiety 06/12/2014  . Diverticulosis   . Ehrlichiosis [id number]  . Gallstones   . GERD (gastroesophageal reflux disease)   . Hair loss 03/28/2017  . Hiatal hernia 2007  . History of viral illness   . Hyperlipidemia, mixed 04/11/2015  . Insomnia 08/21/2017  . Lactose intolerance 06/12/2014  . Mumps as a child  . Overweight 08/19/2014  . Rectocele 06/12/2014  . RLS (restless legs syndrome)   . 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 Sessile colonic polyp   . SVT (supraventricular tachycardia) ([place])   . Tachycardia 06/12/2014  . Thyroid disease   . Vitamin D deficiency 08/22/2015    Past Surgical History:  Procedure Laterality Date  . ABDOMINAL HYSTERECTOMY  11-2008   total with cystocele, rectocele repair  . APPENDECTOMY    . CHOLECYSTECTOMY    . COLONOSCOPY  2006   diverticulosis, hemorrhoids ([place])  . ESOPHAGOGASTRODUODENOSCOPY  2006   small hiatus hernia ([place])  . ESOPHAGOGASTRODUODENOSCOPY N/A 02/21/2018   Procedure: ESOPHAGOGASTRODUODENOSCOPY (EGD);  Surgeon: Rogene Houston, MD;  Location: [facility];  Service: Endoscopy;  Laterality: N/A;  pt knows to arrive at 2:45  .  INCONTINENCE SURGERY    . SKIN SURGERY     right arm scc  . svt ablation    . TONSILLECTOMY  1972   2ith adenoid  . WISDOM TOOTH EXTRACTION  64 yrs old    Family History  Problem Relation Age of Onset  . Hypertension Mother   . Hypertension Father   . Hypertension Sister   . Diabetes Sister        type 2  . Fibromyalgia Sister   . GI Bleed Maternal Grandmother   . Atrial fibrillation Maternal Grandmother   . Heart disease Maternal Grandfather   . Diabetes Paternal Grandmother   . Stroke Paternal Grandfather   . Proteinuria Sister   . Colon cancer Neg Hx   . Esophageal cancer Neg Hx   . Rectal cancer Neg Hx   . Stomach cancer Neg Hx     Social History   Socioeconomic History  . Marital status: Divorced    Spouse name: Not on file  . Number of children: 2  . Years of education: Not on file  . Highest education level: Not on file  Occupational History  . Occupation: Programmer, multimedia: [employer]  Tobacco Use  . Smoking status: Never Smoker  . Smokeless tobacco: Never Used  Substance and Sexual Activity  . Alcohol use: No    Alcohol/week: 0.0 standard drinks  . Drug use: No  . Sexual activity: Not on  file    Comment: lives by self with 4 dogs works at [employer], avoids dairy  Other Topics Concern  . Not on file  Social History Narrative  . Not on file   Social Determinants of Health   Financial Resource Strain:   . Difficulty of Paying Living Expenses: Not on file  Food Insecurity:   . Worried About Charity fundraiser in the Last Year: Not on file  . Ran Out of Food in the Last Year: Not on file  Transportation Needs:   . Lack of Transportation (Medical): Not on file  . Lack of Transportation (Non-Medical): Not on file  Physical Activity:   . Days of Exercise per Week: Not on file  . Minutes of Exercise per Session: Not on file  Stress:   . Feeling of Stress : Not on file  Social Connections:   . Frequency of Communication with Friends and Family: Not  on file  . Frequency of Social Gatherings with Friends and Family: Not on file  . Attends Religious Services: Not on file  . Active Member of Clubs or Organizations: Not on file  . Attends Archivist Meetings: Not on file  . Marital Status: Not on file  Intimate Partner Violence:   . Fear of Current or Ex-Partner: Not on file  . Emotionally Abused: Not on file  . Physically Abused: Not on file  . Sexually Abused: Not on file    Outpatient Medications Prior to Visit  Medication Sig Dispense Refill  . Cholecalciferol (VITAMIN D3) 5000 units CAPS Take 5,000 Units by mouth daily.     . colesevelam (WELCHOL) 625 MG tablet TAKE 3 TABLETS BY MOUTH TWICE DAILY WITH MEALS 180 tablet 0  . dicyclomine (BENTYL) 10 MG capsule Take 1 capsule (10 mg total) by mouth 3 (three) times daily before meals. 270 capsule 3  . famotidine (PEPCID) 40 MG tablet Take 1 tablet (40 mg total) by mouth 2 (two) times daily. 60 tablet 5  . metoprolol succinate (TOPROL-XL) 100 MG 24 hr tablet TAKE 1 TABLET BY MOUTH 2 TIMES DAILY. TAKE WITH OR IMMEDIATELY FOLLOWING A MEAL. 180 tablet 3  . Omega-3 Fatty Acids (FISH OIL PO) Take 2,000 mg by mouth 2 (two) times daily.    . ondansetron (ZOFRAN) 4 MG tablet Take 1 tablet (4 mg total) by mouth every 6 (six) hours as needed for nausea or vomiting. 40 tablet 1  . pantoprazole (PROTONIX) 40 MG tablet Take 1 tablet (40 mg total) by mouth 2 (two) times daily before a meal. 180 tablet 3  . traZODone (DESYREL) 150 MG tablet TAKE 1 TABLET BY MOUTH AT BEDTIME. 90 tablet 1  . LORazepam (ATIVAN) 1 MG tablet TAKE 1/2 TO 1 TABLET BY MOUTH EVERY 8 HOURS AS NEEDED FOR ANXIETY 70 tablet 2  . metroNIDAZOLE (FLAGYL) 500 MG tablet Take 1 tablet (500 mg total) by mouth 3 (three) times daily. 21 tablet 0  . temazepam (RESTORIL) 30 MG capsule TAKE 1 CAPSULE BY MOUTH ONCE DAILY AT BEDTIME 30 capsule 5  . ciprofloxacin (CIPRO) 500 MG tablet Take 500 mg by mouth 2 (two) times daily.     No  facility-administered medications prior to visit.    Allergies  Allergen Reactions  . Dilaudid [Hydromorphone Hcl] Other (See Comments)    Respiratory , cardiac     Review of Systems  Constitutional: Positive for malaise/fatigue. Negative for fever.  HENT: Negative for congestion.   Eyes: Negative for blurred  vision.  Respiratory: Negative for shortness of breath.   Cardiovascular: Positive for palpitations. Negative for chest pain and leg swelling.  Gastrointestinal: Negative for abdominal pain, blood in stool and nausea.  Genitourinary: Negative for dysuria and frequency.  Musculoskeletal: Negative for falls.  Skin: Negative for rash.  Neurological: Negative for dizziness, loss of consciousness and headaches.  Endo/Heme/Allergies: Negative for environmental allergies.  Psychiatric/Behavioral: Positive for depression. The patient is nervous/anxious.        Objective:    Physical Exam Vitals and nursing note reviewed.  Constitutional:      General: She is not in acute distress.    Appearance: She is well-developed.  HENT:     Head: Normocephalic and atraumatic.     Nose: Nose normal.  Eyes:     General:        Right eye: No discharge.        Left eye: No discharge.  Cardiovascular:     Rate and Rhythm: Normal rate and regular rhythm.     Heart sounds: No murmur.  Pulmonary:     Effort: Pulmonary effort is normal.     Breath sounds: Normal breath sounds.  Abdominal:     General: Bowel sounds are normal.     Palpations: Abdomen is soft.     Tenderness: There is no abdominal tenderness.  Musculoskeletal:     Cervical back: Normal range of motion and neck supple.  Skin:    General: Skin is warm and dry.  Neurological:     Mental Status: She is alert and oriented to person, place, and time.     BP 134/86 (BP Location: Left Arm, Patient Position: Sitting, Cuff Size: Normal)   Pulse 85   Temp (!) 97.4 F (36.3 C) (Temporal)   Ht 5\' 4"  (1.626 m)   Wt 192 lb 2 oz  (87.1 kg)   LMP  (LMP Unknown)   SpO2 98%   BMI 32.98 kg/m  Wt Readings from Last 3 Encounters:  01/26/20 192 lb 2 oz (87.1 kg)  01/14/20 194 lb 3.2 oz (88.1 kg)  10/09/19 203 lb 2 oz (92.1 kg)    Diabetic Foot Exam - Simple   No data filed     Lab Results  Component Value Date   WBC 7.0 01/26/2020   HGB 13.9 01/26/2020   HCT 42.0 01/26/2020   PLT 309.0 01/26/2020   GLUCOSE 121 (H) 01/26/2020   CHOL 266 (H) 01/28/2020   TRIG 372.0 (H) 01/28/2020   HDL 52.90 01/28/2020   LDLDIRECT 136.0 01/28/2020   LDLCALC 118 (H) 10/22/2018   ALT 36 (H) 01/26/2020   AST 23 01/26/2020   NA 140 01/26/2020   K 4.2 01/26/2020   CL 105 01/26/2020   CREATININE 0.72 01/26/2020   BUN 13 01/26/2020   CO2 24 01/26/2020   TSH 1.25 01/26/2020   INR 1.0 11/09/2008   HGBA1C 5.9 01/26/2020    Lab Results  Component Value Date   TSH 1.25 01/26/2020   Lab Results  Component Value Date   WBC 7.0 01/26/2020   HGB 13.9 01/26/2020   HCT 42.0 01/26/2020   MCV 85.7 01/26/2020   PLT 309.0 01/26/2020   Lab Results  Component Value Date   NA 140 01/26/2020   K 4.2 01/26/2020   CO2 24 01/26/2020   GLUCOSE 121 (H) 01/26/2020   BUN 13 01/26/2020   CREATININE 0.72 01/26/2020   BILITOT 0.5 01/26/2020   ALKPHOS 80 01/26/2020   AST 23 01/26/2020  ALT 36 (H) 01/26/2020   PROT 7.4 01/26/2020   ALBUMIN 4.8 01/26/2020   CALCIUM 9.9 01/26/2020   ANIONGAP 10 01/29/2019   GFR 82.21 01/26/2020   Lab Results  Component Value Date   CHOL 266 (H) 01/28/2020   Lab Results  Component Value Date   HDL 52.90 01/28/2020   Lab Results  Component Value Date   LDLCALC 118 (H) 10/22/2018   Lab Results  Component Value Date   TRIG 372.0 (H) 01/28/2020   Lab Results  Component Value Date   CHOLHDL 5 01/28/2020   Lab Results  Component Value Date   HGBA1C 5.9 01/26/2020       Assessment & Plan:   Problem List Items Addressed This Visit    Euthyroid Hashimoto's thyroiditis - Primary  (Chronic)   Relevant Orders   T4, free (Completed)   TSH (Completed)   Thyroid peroxidase antibody (Completed)   US THYROID   Depression with anxiety    Has struggled with anhedonia during the pandemic but is managing to continue working and stay busy.       Relevant Medications   LORazepam (ATIVAN) 1 MG tablet   Abdominal pain    Has had a flare in abdominal pain for a few days however it is some improved today. Maintain a bland diet and hydrate well. Report if worsens.      Hyperlipidemia, mixed    Encouraged heart healthy diet, increase exercise, avoid trans fats, consider a krill oil cap daily      Hyperglycemia    hgba1c acceptable, minimize simple carbs. Increase exercise as tolerated. Continue current meds.       Relevant Orders   Hemoglobin A1c (Completed)   Comprehensive metabolic panel (Completed)   Insulin, random (Completed)   Vitamin D deficiency    Supplement and monitor      Relevant Orders   Vitamin D (25 hydroxy) (Completed)   Insomnia    Encouraged good sleep hygiene such as dark, quiet room. No blue/green glowing lights such as computer screens in bedroom. No alcohol or stimulants in evening. Cut down on caffeine as able. Regular exercise is helpful but not just prior to bed time.       Diverticulitis   Palpitation    Is waking up around 4 am every morning. With racing heart beat and anxiety and hot sweats. Consider low sugars vs hot flashes vs cardiac arrthymia      Relevant Orders   CBC (Completed)   T4, free (Completed)   TSH (Completed)   Thyroid peroxidase antibody (Completed)   US THYROID    Other Visit Diagnoses    Urinary frequency       Relevant Orders   Urinalysis (Completed)   Urine Culture (Completed)      I have discontinued Barnetta Chapel A. Trier's metroNIDAZOLE and ciprofloxacin. I have also changed her LORazepam. Additionally, I am having her maintain her Vitamin D3, Omega-3 Fatty Acids (FISH OIL PO), pantoprazole,  dicyclomine, metoprolol succinate, famotidine, traZODone, colesevelam, ondansetron, and temazepam.  Meds ordered this encounter  Medications  . LORazepam (ATIVAN) 1 MG tablet    Sig: Take 0.5-1 tablets (0.5-1 mg total) by mouth every 8 (eight) hours as needed for anxiety.    Dispense:  70 tablet    Refill:  2  . temazepam (RESTORIL) 30 MG capsule    Sig: TAKE 1 CAPSULE BY MOUTH ONCE DAILY AT BEDTIME    Dispense:  30 capsule    Refill:  5  Penni Homans, MD

## 2020-01-28 NOTE — Assessment & Plan Note (Signed)
Has had a flare in abdominal pain for a few days however it is some improved today. Maintain a bland diet and hydrate well. Report if worsens.

## 2020-01-28 NOTE — Assessment & Plan Note (Signed)
Has struggled with anhedonia during the pandemic but is managing to continue working and stay busy.

## 2020-01-29 ENCOUNTER — Other Ambulatory Visit: Payer: Self-pay | Admitting: Family Medicine

## 2020-01-29 MED ORDER — ROSUVASTATIN CALCIUM 5 MG PO TABS: 5.0000 mg | ORAL_TABLET | Freq: Every day | ORAL | 1 refills | Status: DC

## 2020-01-29 MED ORDER — FREESTYLE LIBRE SENSOR SYSTEM MISC: 2 refills | Status: DC

## 2020-01-30 MED FILL — ROSUVASTATIN CALCIUM 5 MG T: 5 | 90 days supply | Qty: 90 | Fill #0

## 2020-02-03 ENCOUNTER — Other Ambulatory Visit: Payer: Self-pay | Admitting: *Deleted

## 2020-02-04 ENCOUNTER — Other Ambulatory Visit: Payer: Self-pay

## 2020-02-04 ENCOUNTER — Other Ambulatory Visit: Payer: Self-pay | Admitting: Physician Assistant

## 2020-02-04 ENCOUNTER — Ambulatory Visit (HOSPITAL_BASED_OUTPATIENT_CLINIC_OR_DEPARTMENT_OTHER)
Admission: RE | Admit: 2020-02-04 | Discharge: 2020-02-04 | Disposition: A | Discharge status: Home / Self Care | Payer: 59 | Source: Ambulatory Visit | Attending: Family Medicine | Admitting: Family Medicine

## 2020-02-04 DIAGNOSIS — E063 Autoimmune thyroiditis: Secondary | ICD-10-CM | POA: Diagnosis not present

## 2020-02-04 DIAGNOSIS — E079 Disorder of thyroid, unspecified: Secondary | ICD-10-CM | POA: Diagnosis not present

## 2020-02-04 DIAGNOSIS — R002 Palpitations: Secondary | ICD-10-CM | POA: Insufficient documentation

## 2020-02-04 MED FILL — traZODone HCL 150 MG TABS: 150 | 90 days supply | Qty: 90 | Fill #1

## 2020-02-04 MED FILL — TEMAZEPAM 30 MG CAPSULE: 30 | 30 days supply | Qty: 30 | Fill #4

## 2020-02-05 MED FILL — COLESEVELAM HCL 625 MG TABS: 625 | 30 days supply | Qty: 180 | Fill #0

## 2020-02-17 MED FILL — FAMOTIDINE 40 MG TABS: 40 | 30 days supply | Qty: 60 | Fill #2

## 2020-02-24 ENCOUNTER — Telehealth: Payer: Self-pay | Admitting: Gastroenterology

## 2020-02-24 MED FILL — METFORMIN HCL 500 MG TABS: 500 | 30 days supply | Qty: 30 | Fill #1

## 2020-02-24 NOTE — Telephone Encounter (Signed)
Patient reports that she believes her diverticulitis has returned. Se has a rx at home that Dr. Charlett Blake gave her for PRN flares.  She wants to be seen, but can't get off work until Monday.  She will come see Nicoletta Ba PA on 2/29 1:30. She will start on clear liquids tonight and slowly advance as tolerated.  She has dicyclomine at home as well she will use PRN

## 2020-02-27 ENCOUNTER — Encounter: Payer: Self-pay | Admitting: Physician Assistant

## 2020-02-27 ENCOUNTER — Other Ambulatory Visit (INDEPENDENT_AMBULATORY_CARE_PROVIDER_SITE_OTHER): Payer: 59

## 2020-02-27 ENCOUNTER — Ambulatory Visit: Payer: 59 | Admitting: Physician Assistant

## 2020-02-27 VITALS — BP 122/82 | HR 80 | Temp 97.9°F | Ht 64.0 in | Wt 184.0 lb

## 2020-02-27 DIAGNOSIS — R6883 Chills (without fever): Secondary | ICD-10-CM

## 2020-02-27 DIAGNOSIS — R1032 Left lower quadrant pain: Secondary | ICD-10-CM

## 2020-02-27 LAB — CBC WITH DIFFERENTIAL/PLATELET
Basophils Absolute: 0 10*3/uL (ref 0.0–0.1)
Basophils Relative: 0.5 % (ref 0.0–3.0)
Eosinophils Absolute: 0.1 10*3/uL (ref 0.0–0.7)
Eosinophils Relative: 1.6 % (ref 0.0–5.0)
HCT: 39.5 % (ref 36.0–46.0)
Hemoglobin: 13.5 g/dL (ref 12.0–15.0)
Lymphocytes Relative: 35 % (ref 12.0–46.0)
Lymphs Abs: 3 10*3/uL (ref 0.7–4.0)
MCHC: 34.1 g/dL (ref 30.0–36.0)
MCV: 84 fl (ref 78.0–100.0)
Monocytes Absolute: 0.5 10*3/uL (ref 0.1–1.0)
Monocytes Relative: 5.4 % (ref 3.0–12.0)
Neutro Abs: 4.9 10*3/uL (ref 1.4–7.7)
Neutrophils Relative %: 57.5 % (ref 43.0–77.0)
Platelets: 306 10*3/uL (ref 150.0–400.0)
RBC: 4.7 Mil/uL (ref 3.87–5.11)
RDW: 13.2 % (ref 11.5–15.5)
WBC: 8.5 10*3/uL (ref 4.0–10.5)

## 2020-02-27 NOTE — Patient Instructions (Signed)
If you are age 62 or older, your body mass index should be between 23-30. Your Body mass index is 31.58 kg/m. If this is out of the aforementioned range listed, please consider follow up with your Primary Care Provider.  If you are age 69 or younger, your body mass index should be between 19-25. Your Body mass index is 31.58 kg/m. If this is out of the aformentioned range listed, please consider follow up with your Primary Care Provider.   You have been scheduled for a CT scan of the abdomen and pelvis at [facility] ([address]---this is in the same building as [facility]).   You are scheduled on 03/01/20 at 10:15 am. You should arrive 15 minutes prior to your appointment time for registration. Please follow the written instructions below on the day of your exam:  WARNING: IF YOU ARE ALLERGIC TO IODINE/X-RAY DYE, PLEASE NOTIFY RADIOLOGY IMMEDIATELY AT [phone]! YOU WILL BE GIVEN A 13 HOUR PREMEDICATION PREP.  1) Do not eat or drink anything after 6:15 am (4 hours prior to your test) 2) You have been given 2 bottles of oral contrast to drink. The solution may taste better if refrigerated, but do NOT add ice or any other liquid to this solution. Shake well before drinking.    Drink 1 bottle of contrast @ 8:15 am (2 hours prior to your exam)  Drink 1 bottle of contrast @ 9:15 (1 hour prior to your exam)  You may take any medications as prescribed with a small amount of water, if necessary. If you take any of the following medications: METFORMIN, GLUCOPHAGE, GLUCOVANCE, AVANDAMET, RIOMET, FORTAMET, [place] MET, JANUMET, GLUMETZA or METAGLIP, you MAY be asked to HOLD this medication 48 hours AFTER the exam.  The purpose of you drinking the oral contrast is to aid in the visualization of your intestinal tract. The contrast solution may cause some diarrhea. Depending on your individual set of symptoms, you may also receive an intravenous injection of  x-ray contrast/dye. Plan on being at [facility] for 30 minutes or longer, depending on the type of exam you are having performed.  This test typically takes 30-45 minutes to complete.  If you have any questions regarding your exam or if you need to reschedule, you may call the CT department at [phone] between the hours of 8:00 am and 5:00 pm, Monday-Friday.  ________________________________________________________________________ Your provider has requested that you go to the basement level for lab work before leaving today. Press "B" on the elevator. The lab is located at the first door on the left as you exit the elevator.  Due to recent changes in healthcare laws, you may see the results of your imaging and laboratory studies on MyChart before your provider has had a chance to review them.  We understand that in some cases there may be results that are confusing or concerning to you. Not all laboratory results come back in the same time frame and the provider may be waiting for multiple results in order to interpret others.  Please give [place] 48 hours in order for your provider to thoroughly review all the results before contacting the office for clarification of your results.   Start drinking clear liquids, DO NOT get constipated.  Go to the ER if symptoms worse over the weekend.

## 2020-02-27 NOTE — Progress Notes (Signed)
Chief Complaint: Diverticulitis  HPI:    [name] Jensen is a 64 year old female with a past medical history as listed below, known to Dr. Fuller Plan, who presents clinic today for follow-up of diverticulitis.    01/14/2020 patient seen in clinic by Nicoletta Ba, PA-C and at that time complained of acute onset of lower abdominal pain with nausea and fever.  Also some left lower quadrant pain.  Started Cipro and Flagyl at home and started to feel a bit better.  Last colonoscopy noted in March 2020 with 2 small polyps removed and both were tubular adenomas, noted to have moderate left colon diverticulosis.  Had a CT done November 2020 with an episode of diverticulitis which did show sigmoid diverticulosis with mild diverticulitis.  Family history of colon cancer in her mother.  Patient is status post cholecystectomy, appendectomy, hysterectomy and has history of IBS.  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  She was told to complete a 10-day course of Cipro 500 mg p.o. twice daily and Flagyl 500 mg p.o. 3 times daily.  She was sent a prescription for Zofran 4 mg every 6 hours as needed and told to restart WelChol when nausea resolves.  CBC CMP lipase and UA were checked.  It was recommended she have a CT of the abdomen and pelvis if symptoms did not improve over the next day or 2.  Labs are normal.    02/24/2020 patient called and said that she thought she was having diverticulitis flare.  She had some Cipro/Flagyl for flares from her PCP and was started on clear liquids.  With dicyclomine use as needed.    Today, the patient explains that on Tuesday, 02/24/2020 she started with pain in her left lower quadrant that tracked over to the right just like her previous diverticulitis attacks.  Explains that she started the Cipro and Flagyl that she had at home and after taking her fourth dose on the second day,  Wednesday afternoon she had no further pain, but this then came back yesterday afternoon and felt more like a spasming.  She has restarted her Dicyclomine as of this morning which is helping slightly.  Tells me that she is getting go back to a clear liquid diet.  Also explains when symptoms started she had chills/rigors and could not get warm until she got underneath her heating blanket.    Denies documented fever, nausea, vomiting or blood in her stool.  Past Medical History:  Diagnosis Date  . Adjustment reaction with anxiety and depression 06/12/2014  . Anemia    h/o low ferritin  . Anxiety   . Asthma    environmental triggers  . Chicken pox as a child  . Cough 08/22/2015  . Depression   . Depression with anxiety 06/12/2014  . Diverticulosis   . Ehrlichiosis [id number]  . Gallstones   . GERD (gastroesophageal reflux disease)   . Hair loss 03/28/2017  . Hiatal hernia 2007  . History of viral illness   . Hyperlipidemia, mixed 04/11/2015  . Insomnia 08/21/2017  . Lactose intolerance 06/12/2014  . Mumps as a child  . Overweight 08/19/2014  . Rectocele 06/12/2014  . RLS (restless legs syndrome)   . 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 Sessile colonic polyp   . SVT (supraventricular tachycardia) ([place])   . Tachycardia 06/12/2014  . Thyroid disease   .  Vitamin D deficiency 08/22/2015    Past Surgical History:  Procedure Laterality Date  . ABDOMINAL HYSTERECTOMY  11-2008   total with cystocele, rectocele repair  . APPENDECTOMY    . CHOLECYSTECTOMY    . COLONOSCOPY  2006   diverticulosis, hemorrhoids ([place])  . ESOPHAGOGASTRODUODENOSCOPY  2006   small hiatus hernia ([place])  . ESOPHAGOGASTRODUODENOSCOPY N/A 02/21/2018   Procedure: ESOPHAGOGASTRODUODENOSCOPY (EGD);  Surgeon: Rogene Houston, MD;  Location: [facility];  Service: Endoscopy;  Laterality: N/A;  pt knows to arrive at 2:45  . INCONTINENCE SURGERY    . SKIN SURGERY     right arm scc  . svt  ablation    . TONSILLECTOMY  1972   2ith adenoid  . WISDOM TOOTH EXTRACTION  64 yrs old    Current Outpatient Medications  Medication Sig Dispense Refill  . Cholecalciferol (VITAMIN D3) 5000 units CAPS Take 5,000 Units by mouth daily.     . colesevelam (WELCHOL) 625 MG tablet TAKE 3 TABLETS BY MOUTH TWICE DAILY WITH MEALS (Patient taking differently: Dose varies from day to day) 180 tablet 0  . dicyclomine (BENTYL) 10 MG capsule Take 1 capsule (10 mg total) by mouth 3 (three) times daily before meals. 270 capsule 3  . famotidine (PEPCID) 40 MG tablet Take 1 tablet (40 mg total) by mouth 2 (two) times daily. 60 tablet 5  . LORazepam (ATIVAN) 1 MG tablet Take 0.5-1 tablets (0.5-1 mg total) by mouth every 8 (eight) hours as needed for anxiety. 70 tablet 2  . metFORMIN (GLUCOPHAGE) 500 MG tablet Take 1 tablet (500 mg total) by mouth daily with breakfast. 30 tablet 3  . metoprolol succinate (TOPROL-XL) 100 MG 24 hr tablet TAKE 1 TABLET BY MOUTH 2 TIMES DAILY. TAKE WITH OR IMMEDIATELY FOLLOWING A MEAL. 180 tablet 3  . Omega-3 Fatty Acids (FISH OIL PO) Take 2,000 mg by mouth 2 (two) times daily.    . pantoprazole (PROTONIX) 40 MG tablet Take 1 tablet (40 mg total) by mouth 2 (two) times daily before a meal. 180 tablet 3  . rosuvastatin (CRESTOR) 5 MG tablet Take 1 tablet (5 mg total) by mouth daily. 90 tablet 1  . temazepam (RESTORIL) 30 MG capsule TAKE 1 CAPSULE BY MOUTH ONCE DAILY AT BEDTIME 30 capsule 5  . traZODone (DESYREL) 150 MG tablet TAKE 1 TABLET BY MOUTH AT BEDTIME. 90 tablet 1  . ondansetron (ZOFRAN) 4 MG tablet Take 1 tablet (4 mg total) by mouth every 6 (six) hours as needed for nausea or vomiting. (Patient not taking: Reported on 02/27/2020) 40 tablet 1   No current facility-administered medications for this visit.    Allergies as of 02/27/2020 - Review Complete 02/27/2020  Allergen Reaction Noted  . Dilaudid [hydromorphone hcl] Other (See Comments) 09/23/2011    Family History    Problem Relation Age of Onset  . Hypertension Mother   . Hypertension Father   . Hypertension Sister   . Diabetes Sister        type 2  . Fibromyalgia Sister   . GI Bleed Maternal Grandmother   . Atrial fibrillation Maternal Grandmother   . Heart disease Maternal Grandfather   . Diabetes Paternal Grandmother   . Stroke Paternal Grandfather   . Proteinuria Sister   . Colon cancer Neg Hx   . Esophageal cancer Neg Hx   . Rectal cancer Neg Hx   . Stomach cancer Neg Hx     Social History   Socioeconomic History  .  Marital status: Divorced    Spouse name: Not on file  . Number of children: 2  . Years of education: Not on file  . Highest education level: Not on file  Occupational History  . Occupation: Programmer, multimedia: [employer]  Tobacco Use  . Smoking status: Never Smoker  . Smokeless tobacco: Never Used  Substance and Sexual Activity  . Alcohol use: No    Alcohol/week: 0.0 standard drinks  . Drug use: No  . Sexual activity: Not on file    Comment: lives by self with 4 dogs works at [employer], avoids dairy  Other Topics Concern  . Not on file  Social History Narrative  . Not on file   Social Determinants of Health   Financial Resource Strain:   . Difficulty of Paying Living Expenses:   Food Insecurity:   . Worried About Charity fundraiser in the Last Year:   . Arboriculturist in the Last Year:   Transportation Needs:   . Film/video editor (Medical):   Marland Kitchen Lack of Transportation (Non-Medical):   Physical Activity:   . Days of Exercise per Week:   . Minutes of Exercise per Session:   Stress:   . Feeling of Stress :   Social Connections:   . Frequency of Communication with Friends and Family:   . Frequency of Social Gatherings with Friends and Family:   . Attends Religious Services:   . Active Member of Clubs or Organizations:   . Attends Archivist Meetings:   Marland Kitchen Marital Status:   Intimate Partner Violence:   . Fear of Current or Ex-Partner:    . Emotionally Abused:   Marland Kitchen Physically Abused:   . Sexually Abused:     Review of Systems:    Constitutional: No weight loss, fever or chills Cardiovascular: No chest pain Respiratory: No SOB  Gastrointestinal: See HPI and otherwise negative   Physical Exam:  Vital signs: BP 122/82   Pulse 80   Temp 97.9 F (36.6 C)   Ht 5\' 4"  (1.626 m)   Wt 184 lb (83.5 kg)   LMP  (LMP Unknown)   BMI 31.58 kg/m   Constitutional:   Pleasant Caucasian female appears to be in NAD, Well developed, Well nourished, alert and cooperative Respiratory: Respirations even and unlabored. Lungs clear to auscultation bilaterally.   No wheezes, crackles, or rhonchi.  Cardiovascular: Normal S1, S2. No MRG. Regular rate and rhythm. No peripheral edema, cyanosis or pallor.  Gastrointestinal:  Soft, nondistended, moderate generalized ttp, marked in LLQ with involuntary guarding and wincing, Normal bowel sounds. No appreciable masses or hepatomegaly. Rectal:  Not performed.  Psychiatric:  Demonstrates good judgement and reason without abnormal affect or behaviors.  RELEVANT LABS AND IMAGING: CBC    Component Value Date/Time   WBC 7.0 01/26/2020 1036   RBC 4.90 01/26/2020 1036   HGB 13.9 01/26/2020 1036   HCT 42.0 01/26/2020 1036   PLT 309.0 01/26/2020 1036   MCV 85.7 01/26/2020 1036   MCH 28.3 05/18/2013 0952   MCHC 33.0 01/26/2020 1036   RDW 13.8 01/26/2020 1036   LYMPHSABS 3.2 01/14/2020 1451   MONOABS 0.5 01/14/2020 1451   EOSABS 0.1 01/14/2020 1451   BASOSABS 0.0 01/14/2020 1451    CMP     Component Value Date/Time   NA 140 01/26/2020 1036   K 4.2 01/26/2020 1036   CL 105 01/26/2020 1036   CO2 24 01/26/2020 1036   GLUCOSE  121 (H) 01/26/2020 1036   BUN 13 01/26/2020 1036   CREATININE 0.72 01/26/2020 1036   CALCIUM 9.9 01/26/2020 1036   PROT 7.4 01/26/2020 1036   ALBUMIN 4.8 01/26/2020 1036   AST 23 01/26/2020 1036   ALT 36 (H) 01/26/2020 1036   ALKPHOS 80 01/26/2020 1036   BILITOT 0.5  01/26/2020 1036   GFRNONAA >60 01/29/2019 0823   GFRAA >60 01/29/2019 0823    Assessment: 1.  LLQ abdominal pain: This is patient's third episode of suspected diverticulitis in the past 5 months, initially better with 2 days cirpo/flagyl, now some symptoms have returned 2. Chills: likely from above  Plan: 1.  Discussed with patient that I am not happy about the amount of abdominal pain she has on exam today.  I recommended that we order a stat CT of the abdomen pelvis to be done this afternoon but patient tells me she needs to go home to let the dogs in before the [facility]ought gets there.  She would like this to be scheduled on Monday.  Promised me that if symptoms worsen over the weekend she would proceed to the ER. 2.  Scheduled patient for a CT of the abdomen pelvis with contrast on Monday. 3.  Ordered labs including CBC and CMP today. 4.  Recommend the patient maintain a clear liquid diet over the weekend and continue her Cipro and Flagyl as well as Dicyclomine as needed.  Discussed that she should not get constipated.  If she needs to she can increase her Miralax dosing. 5.  Patient to follow in clinic for recommendations after imaging and labs above.  Discussed that we may have to change her antibiotics to Augmentin.  Ellouise Newer, PA-C [facility] 02/27/2020, 3:40 PM  Cc: [name], MD

## 2020-02-29 MED FILL — LORazepam 1 MG TABS: 1 | 23 days supply | Qty: 70 | Fill #1

## 2020-02-29 NOTE — Progress Notes (Signed)
Reviewed and agree with management plan.  Calum Cormier T. Ananth Fiallos, MD FACG [facility]  

## 2020-03-01 ENCOUNTER — Ambulatory Visit: Payer: 59 | Admitting: Physician Assistant

## 2020-03-01 ENCOUNTER — Other Ambulatory Visit: Payer: Self-pay

## 2020-03-01 ENCOUNTER — Ambulatory Visit (INDEPENDENT_AMBULATORY_CARE_PROVIDER_SITE_OTHER)
Admission: RE | Admit: 2020-03-01 | Discharge: 2020-03-01 | Disposition: A | Discharge status: Home / Self Care | Payer: 59 | Source: Ambulatory Visit | Attending: Physician Assistant | Admitting: Physician Assistant

## 2020-03-01 DIAGNOSIS — R197 Diarrhea, unspecified: Secondary | ICD-10-CM | POA: Diagnosis not present

## 2020-03-01 DIAGNOSIS — R1032 Left lower quadrant pain: Secondary | ICD-10-CM | POA: Diagnosis not present

## 2020-03-01 DIAGNOSIS — R6883 Chills (without fever): Secondary | ICD-10-CM | POA: Diagnosis not present

## 2020-03-01 MED ORDER — IOHEXOL 300 MG/ML  SOLN
100.0000 mL | Freq: Once | INTRAMUSCULAR | Status: AC | PRN
  Administered 2020-03-01: 100 mL via INTRAVENOUS

## 2020-03-08 MED FILL — TEMAZEPAM 30 MG CAPSULE: 30 | 30 days supply | Qty: 30 | Fill #5

## 2020-04-02 MED FILL — LORazepam 1 MG TABS: 1 | 23 days supply | Qty: 70 | Fill #2

## 2020-04-07 DIAGNOSIS — L905 Scar conditions and fibrosis of skin: Secondary | ICD-10-CM | POA: Diagnosis not present

## 2020-04-07 DIAGNOSIS — L814 Other melanin hyperpigmentation: Secondary | ICD-10-CM | POA: Diagnosis not present

## 2020-04-07 DIAGNOSIS — D225 Melanocytic nevi of trunk: Secondary | ICD-10-CM | POA: Diagnosis not present

## 2020-04-07 DIAGNOSIS — Z85828 Personal history of other malignant neoplasm of skin: Secondary | ICD-10-CM | POA: Diagnosis not present

## 2020-04-07 DIAGNOSIS — D1801 Hemangioma of skin and subcutaneous tissue: Secondary | ICD-10-CM | POA: Diagnosis not present

## 2020-04-07 DIAGNOSIS — L570 Actinic keratosis: Secondary | ICD-10-CM | POA: Diagnosis not present

## 2020-04-07 DIAGNOSIS — D485 Neoplasm of uncertain behavior of skin: Secondary | ICD-10-CM | POA: Diagnosis not present

## 2020-04-07 DIAGNOSIS — B078 Other viral warts: Secondary | ICD-10-CM | POA: Diagnosis not present

## 2020-04-07 DIAGNOSIS — L700 Acne vulgaris: Secondary | ICD-10-CM | POA: Diagnosis not present

## 2020-04-08 ENCOUNTER — Other Ambulatory Visit: Payer: Self-pay | Admitting: Gastroenterology

## 2020-04-08 ENCOUNTER — Other Ambulatory Visit: Payer: Self-pay | Admitting: Family Medicine

## 2020-04-08 ENCOUNTER — Telehealth: Payer: Self-pay | Admitting: *Deleted

## 2020-04-08 MED FILL — FAMOTIDINE 40 MG TABS: 40 | 30 days supply | Qty: 60 | Fill #3

## 2020-04-08 MED FILL — METFORMIN HCL 500 MG TABS: 500 | 30 days supply | Qty: 30 | Fill #2

## 2020-04-08 MED FILL — PANTOPRAZOLE SOD DR 40 MG T: 40 | 90 days supply | Qty: 180 | Fill #0

## 2020-04-08 MED FILL — TEMAZEPAM 30 MG CAPSULE: 30 | 30 days supply | Qty: 30 | Fill #0

## 2020-04-08 NOTE — Telephone Encounter (Signed)
Last written: 01/26/20 Last ov: 01/26/20 Next ov: 04/26/20 Contract: [employer] UDS: [employer] employee

## 2020-04-08 NOTE — Telephone Encounter (Signed)
Not sure which med she needs refilled. Does not seem like anything is quite due but let me know so I can consider.

## 2020-04-08 NOTE — Telephone Encounter (Signed)
Pharmacy filled already.

## 2020-04-08 NOTE — Telephone Encounter (Signed)
It is temezpam.

## 2020-04-08 NOTE — Telephone Encounter (Signed)
She should be ok til August

## 2020-04-09 MED FILL — TRETINOIN 0.05 % CREA: 0.05 | 30 days supply | Qty: 45 | Fill #0

## 2020-04-22 DIAGNOSIS — L700 Acne vulgaris: Secondary | ICD-10-CM | POA: Diagnosis not present

## 2020-04-22 DIAGNOSIS — D225 Melanocytic nevi of trunk: Secondary | ICD-10-CM | POA: Diagnosis not present

## 2020-04-22 DIAGNOSIS — L570 Actinic keratosis: Secondary | ICD-10-CM | POA: Diagnosis not present

## 2020-04-26 ENCOUNTER — Ambulatory Visit (INDEPENDENT_AMBULATORY_CARE_PROVIDER_SITE_OTHER): Payer: 59 | Admitting: Family Medicine

## 2020-04-26 ENCOUNTER — Ambulatory Visit (HOSPITAL_BASED_OUTPATIENT_CLINIC_OR_DEPARTMENT_OTHER)
Admission: RE | Admit: 2020-04-26 | Discharge: 2020-04-26 | Disposition: A | Discharge status: Home / Self Care | Payer: 59 | Source: Ambulatory Visit | Attending: Family Medicine | Admitting: Family Medicine

## 2020-04-26 ENCOUNTER — Encounter: Payer: Self-pay | Admitting: Family Medicine

## 2020-04-26 ENCOUNTER — Other Ambulatory Visit: Payer: Self-pay

## 2020-04-26 VITALS — BP 130/84 | HR 96 | Temp 95.4°F | Ht 64.0 in | Wt 184.4 lb

## 2020-04-26 DIAGNOSIS — E559 Vitamin D deficiency, unspecified: Secondary | ICD-10-CM

## 2020-04-26 DIAGNOSIS — R739 Hyperglycemia, unspecified: Secondary | ICD-10-CM

## 2020-04-26 DIAGNOSIS — F418 Other specified anxiety disorders: Secondary | ICD-10-CM

## 2020-04-26 DIAGNOSIS — E782 Mixed hyperlipidemia: Secondary | ICD-10-CM | POA: Diagnosis not present

## 2020-04-26 DIAGNOSIS — M542 Cervicalgia: Secondary | ICD-10-CM | POA: Insufficient documentation

## 2020-04-26 DIAGNOSIS — Z23 Encounter for immunization: Secondary | ICD-10-CM | POA: Diagnosis not present

## 2020-04-26 DIAGNOSIS — R197 Diarrhea, unspecified: Secondary | ICD-10-CM

## 2020-04-26 LAB — LIPID PANEL
Cholesterol: 174 mg/dL (ref 0–200)
HDL: 52.2 mg/dL (ref >39.00)
NonHDL: 121.37
Total CHOL/HDL Ratio: 3
Triglycerides: 287 mg/dL — ABNORMAL HIGH (ref 0.0–149.0)
VLDL: 57.4 mg/dL — ABNORMAL HIGH (ref 0.0–40.0)

## 2020-04-26 LAB — CBC
HCT: 40.6 % (ref 36.0–46.0)
Hemoglobin: 13.6 g/dL (ref 12.0–15.0)
MCHC: 33.4 g/dL (ref 30.0–36.0)
MCV: 84.7 fl (ref 78.0–100.0)
Platelets: 301 10*3/uL (ref 150.0–400.0)
RBC: 4.79 Mil/uL (ref 3.87–5.11)
RDW: 13.2 % (ref 11.5–15.5)
WBC: 9.2 10*3/uL (ref 4.0–10.5)

## 2020-04-26 LAB — COMPREHENSIVE METABOLIC PANEL
ALT: 19 U/L (ref 0–35)
AST: 17 U/L (ref 0–37)
Albumin: 4.5 g/dL (ref 3.5–5.2)
Alkaline Phosphatase: 70 U/L (ref 39–117)
BUN: 14 mg/dL (ref 6–23)
CO2: 26 mEq/L (ref 19–32)
Calcium: 9.6 mg/dL (ref 8.4–10.5)
Chloride: 103 mEq/L (ref 96–112)
Creatinine, Ser: 0.79 mg/dL (ref 0.40–1.20)
GFR: 73.8 mL/min (ref >60.00)
Glucose, Bld: 104 mg/dL — ABNORMAL HIGH (ref 70–99)
Potassium: 4.5 mEq/L (ref 3.5–5.1)
Sodium: 138 mEq/L (ref 135–145)
Total Bilirubin: 0.4 mg/dL (ref 0.2–1.2)
Total Protein: 7.1 g/dL (ref 6.0–8.3)

## 2020-04-26 LAB — HEMOGLOBIN A1C: Hgb A1c MFr Bld: 6 % (ref 4.6–6.5)

## 2020-04-26 LAB — TSH: TSH: 1.32 u[IU]/mL (ref 0.35–4.50)

## 2020-04-26 LAB — VITAMIN D 25 HYDROXY (VIT D DEFICIENCY, FRACTURES): VITD: 48.95 ng/mL (ref 30.00–100.00)

## 2020-04-26 LAB — LDL CHOLESTEROL, DIRECT: Direct LDL: 72 mg/dL

## 2020-04-26 MED ORDER — TIZANIDINE HCL 2 MG PO TABS: 1.0000 mg | ORAL_TABLET | Freq: Four times a day (QID) | ORAL | 1 refills | Status: DC | PRN

## 2020-04-26 MED FILL — tiZANidine HCL 2 MG TABS: 2 | 5 days supply | Qty: 40 | Fill #0

## 2020-04-26 NOTE — Patient Instructions (Addendum)
For nausea, try Ginger caps or ginger tea as needed  Benefiber twice daily can replace Psyllium Diarrhea, Adult Diarrhea is frequent loose and watery bowel movements. Diarrhea can make you feel weak and cause you to become dehydrated. Dehydration can make you tired and thirsty, cause you to have a dry mouth, and decrease how often you urinate. Diarrhea typically lasts 2-3 days. However, it can last longer if it is a sign of something more serious. It is important to treat your diarrhea as told by your health care provider. Follow these instructions at home: Eating and drinking     Follow these recommendations as told by your health care provider:  Take an oral rehydration solution (ORS). This is an over-the-counter medicine that helps return your body to its normal balance of nutrients and water. It is found at pharmacies and retail stores.  Drink plenty of fluids, such as water, ice chips, diluted fruit juice, and low-calorie sports drinks. You can drink milk also, if desired.  Avoid drinking fluids that contain a lot of sugar or caffeine, such as energy drinks, sports drinks, and soda.  Eat bland, easy-to-digest foods in small amounts as you are able. These foods include bananas, applesauce, rice, lean meats, toast, and crackers.  Avoid alcohol.  Avoid spicy or fatty foods.  Medicines  Take over-the-counter and prescription medicines only as told by your health care provider.  If you were prescribed an antibiotic medicine, take it as told by your health care provider. Do not stop using the antibiotic even if you start to feel better. General instructions   Wash your hands often using soap and water. If soap and water are not available, use a hand sanitizer. Others in the household should wash their hands as well. Hands should be washed: ? After using the toilet or changing a diaper. ? Before preparing, cooking, or serving food. ? While caring for a sick person or while visiting  someone in a hospital.  Drink enough fluid to keep your urine pale yellow.  Rest at home while you recover.  Watch your condition for any changes.  Take a warm bath to relieve any burning or pain from frequent diarrhea episodes.  Keep all follow-up visits as told by your health care provider. This is important. Contact a health care provider if:  You have a fever.  Your diarrhea gets worse.  You have new symptoms.  You cannot keep fluids down.  You feel light-headed or dizzy.  You have a headache.  You have muscle cramps. Get help right away if:  You have chest pain.  You feel extremely weak or you faint.  You have bloody or black stools or stools that look like tar.  You have severe pain, cramping, or bloating in your abdomen.  You have trouble breathing or you are breathing very quickly.  Your heart is beating very quickly.  Your skin feels cold and clammy.  You feel confused.  You have signs of dehydration, such as: ? Dark urine, very little urine, or no urine. ? Cracked lips. ? Dry mouth. ? Sunken eyes. ? Sleepiness. ? Weakness. Summary  Diarrhea is frequent loose and watery bowel movements. Diarrhea can make you feel weak and cause you to become dehydrated.  Drink enough fluids to keep your urine pale yellow.  Make sure that you wash your hands after using the toilet. If soap and water are not available, use hand sanitizer.  Contact a health care provider if your diarrhea gets worse  or you have new symptoms.  Get help right away if you have signs of dehydration. This information is not intended to replace advice given to you by your health care provider. Make sure you discuss any questions you have with your health care provider. Document Revised: 04/08/2019 Document Reviewed: 04/26/2018 Elsevier Patient Education  [place].

## 2020-04-26 NOTE — Progress Notes (Signed)
l °

## 2020-04-26 NOTE — Assessment & Plan Note (Signed)
Intermittent and manageable

## 2020-04-26 NOTE — Assessment & Plan Note (Signed)
Encouraged moist heat and gentle stretching as tolerated. May try NSAIDs and prescription meds as directed and report if symptoms worsen or seek immediate care 

## 2020-04-26 NOTE — Assessment & Plan Note (Addendum)
hgba1c acceptable, minimize simple carbs. Increase exercise as tolerated. Is having episodes in the middle of the night of waking with nausea, diaphoresis, anxiety, she suspects her sugars are dropping but has been checking. +

## 2020-04-26 NOTE — Assessment & Plan Note (Signed)
Still struggling with stress and exhaustion with anhedonia and not sleeping well due to diarrhea nad mind racing. No change for now will continue to monitor

## 2020-04-26 NOTE — Assessment & Plan Note (Signed)
Takes 5000 IU daily, check level

## 2020-04-27 ENCOUNTER — Other Ambulatory Visit: Payer: Self-pay | Admitting: Family Medicine

## 2020-04-27 LAB — INSULIN, RANDOM: Insulin: 25 u[IU]/mL — ABNORMAL HIGH

## 2020-04-27 MED ORDER — METFORMIN HCL ER 500 MG PO TB24: 500.0000 mg | ORAL_TABLET | Freq: Two times a day (BID) | ORAL | 3 refills | Status: DC

## 2020-04-27 MED FILL — metFORMIN HCL ER 500 MG TB2: 500 | 30 days supply | Qty: 60 | Fill #0

## 2020-05-03 NOTE — Progress Notes (Signed)
Patient ID: [name] Jensen, female   DOB: 1956/02/27, 64 y.o.   MRN: [id number]   Subjective:    Patient ID: [name] Jensen, female    DOB: 1955/12/29, 64 y.o.   MRN: [id number]  Chief Complaint  Patient presents with  . Follow-up    HPI Patient is in today for follow up on chronic medical concerns. No recent febrile illness or hospitalizations. She has had 2 lesions removed by dermatology, one on right cheek that was pre Cancer and Left leg lesion was also removed. She saw Gloris Manchester PA. She has struggled with some nausea and diarrhea but it is tolerable. No blood. Continues to be more stressed at work recently and notes neck pain and stiffness. No injury. Denies CP/palp/SOB/HA/congestion/fevers/GI or GU c/o. Taking meds as prescribed  Past Medical History:  Diagnosis Date  . Adjustment reaction with anxiety and depression 06/12/2014  . Anemia    h/o low ferritin  . Anxiety   . Asthma    environmental triggers  . Chicken pox as a child  . Cough 08/22/2015  . Depression   . Depression with anxiety 06/12/2014  . Diverticulosis   . Ehrlichiosis [id number]  . Gallstones   . GERD (gastroesophageal reflux disease)   . Hair loss 03/28/2017  . Hiatal hernia 2007  . History of viral illness   . Hyperlipidemia, mixed 04/11/2015  . Insomnia 08/21/2017  . Lactose intolerance 06/12/2014  . Mumps as a child  . Overweight 08/19/2014  . Rectocele 06/12/2014  . RLS (restless legs syndrome)   . 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 Sessile colonic polyp   . SVT (supraventricular tachycardia) ([place])   . Tachycardia 06/12/2014  . Thyroid disease   . Vitamin D deficiency 08/22/2015    Past Surgical History:  Procedure Laterality Date  . ABDOMINAL HYSTERECTOMY  11-2008   total with cystocele, rectocele repair  . APPENDECTOMY    . CHOLECYSTECTOMY    . COLONOSCOPY  2006   diverticulosis, hemorrhoids ([place])  . ESOPHAGOGASTRODUODENOSCOPY  2006   small hiatus  hernia ([place])  . ESOPHAGOGASTRODUODENOSCOPY N/A 02/21/2018   Procedure: ESOPHAGOGASTRODUODENOSCOPY (EGD);  Surgeon: Rogene Houston, MD;  Location: [facility];  Service: Endoscopy;  Laterality: N/A;  pt knows to arrive at 2:45  . INCONTINENCE SURGERY    . SKIN SURGERY     right arm scc  . svt ablation    . TONSILLECTOMY  1972   2ith adenoid  . WISDOM TOOTH EXTRACTION  64 yrs old    Family History  Problem Relation Age of Onset  . Hypertension Mother   . Hypertension Father   . Hypertension Sister   . Diabetes Sister        type 2  . Fibromyalgia Sister   . GI Bleed Maternal Grandmother   . Atrial fibrillation Maternal Grandmother   . Heart disease Maternal Grandfather   . Diabetes Paternal Grandmother   . Stroke Paternal Grandfather   . Proteinuria Sister   . Colon cancer Neg Hx   . Esophageal cancer Neg Hx   . Rectal cancer Neg Hx   . Stomach cancer Neg Hx     Social History   Socioeconomic History  . Marital status: Divorced    Spouse name: Not on file  . Number of children: 2  . Years of education: Not on file  . Highest education level: Not on file  Occupational History  . Occupation: Therapist, sports  Employer: [employer]  Tobacco Use  . Smoking status: Never Smoker  . Smokeless tobacco: Never Used  Substance and Sexual Activity  . Alcohol use: No    Alcohol/week: 0.0 standard drinks  . Drug use: No  . Sexual activity: Not on file    Comment: lives by self with 4 dogs works at [employer], avoids dairy  Other Topics Concern  . Not on file  Social History Narrative  . Not on file   Social Determinants of Health   Financial Resource Strain:   . Difficulty of Paying Living Expenses:   Food Insecurity:   . Worried About Charity fundraiser in the Last Year:   . Arboriculturist in the Last Year:   Transportation Needs:   . Film/video editor (Medical):   Marland Kitchen Lack of Transportation (Non-Medical):   Physical Activity:   . Days of Exercise per Week:   .  Minutes of Exercise per Session:   Stress:   . Feeling of Stress :   Social Connections:   . Frequency of Communication with Friends and Family:   . Frequency of Social Gatherings with Friends and Family:   . Attends Religious Services:   . Active Member of Clubs or Organizations:   . Attends Archivist Meetings:   Marland Kitchen Marital Status:   Intimate Partner Violence:   . Fear of Current or Ex-Partner:   . Emotionally Abused:   Marland Kitchen Physically Abused:   . Sexually Abused:     Outpatient Medications Prior to Visit  Medication Sig Dispense Refill  . Cholecalciferol (VITAMIN D3) 5000 units CAPS Take 5,000 Units by mouth daily.     . colesevelam (WELCHOL) 625 MG tablet TAKE 3 TABLETS BY MOUTH TWICE DAILY WITH MEALS (Patient taking differently: Dose varies from day to day) 180 tablet 0  . dicyclomine (BENTYL) 10 MG capsule Take 1 capsule (10 mg total) by mouth 3 (three) times daily before meals. 270 capsule 3  . famotidine (PEPCID) 40 MG tablet Take 1 tablet (40 mg total) by mouth 2 (two) times daily. 60 tablet 5  . LORazepam (ATIVAN) 1 MG tablet Take 0.5-1 tablets (0.5-1 mg total) by mouth every 8 (eight) hours as needed for anxiety. 70 tablet 2  . metoprolol succinate (TOPROL-XL) 100 MG 24 hr tablet TAKE 1 TABLET BY MOUTH 2 TIMES DAILY. TAKE WITH OR IMMEDIATELY FOLLOWING A MEAL. 180 tablet 3  . metroNIDAZOLE (FLAGYL) 500 MG tablet Take 500 mg by mouth 3 (three) times daily.    . Omega-3 Fatty Acids (FISH OIL PO) Take 2,000 mg by mouth 2 (two) times daily.    . ondansetron (ZOFRAN) 4 MG tablet Take 1 tablet (4 mg total) by mouth every 6 (six) hours as needed for nausea or vomiting. 40 tablet 1  . pantoprazole (PROTONIX) 40 MG tablet TAKE 1 TABLET (40 MG TOTAL) BY MOUTH TWICE A DAY BEFORE A MEAL. 180 tablet 2  . rosuvastatin (CRESTOR) 5 MG tablet Take 1 tablet (5 mg total) by mouth daily. 90 tablet 1  . temazepam (RESTORIL) 30 MG capsule TAKE 1 CAPSULE BY MOUTH ONCE DAILY AT BEDTIME 30  capsule 5  . traZODone (DESYREL) 150 MG tablet TAKE 1 TABLET BY MOUTH AT BEDTIME. 90 tablet 1  . ciprofloxacin (CIPRO) 500 MG tablet Take 500 mg by mouth 2 (two) times daily.    . metFORMIN (GLUCOPHAGE) 500 MG tablet Take 1 tablet (500 mg total) by mouth daily with breakfast. 30 tablet  3   No facility-administered medications prior to visit.    Allergies  Allergen Reactions  . Dilaudid [Hydromorphone Hcl] Other (See Comments)    Respiratory , cardiac     Review of Systems  Constitutional: Positive for malaise/fatigue. Negative for fever.  HENT: Negative for congestion.   Eyes: Negative for blurred vision.  Respiratory: Negative for shortness of breath.   Cardiovascular: Negative for chest pain, palpitations and leg swelling.  Gastrointestinal: Positive for diarrhea. Negative for abdominal pain, blood in stool and nausea.  Genitourinary: Negative for dysuria and frequency.  Musculoskeletal: Positive for myalgias and neck pain. Negative for falls.  Skin: Negative for rash.  Neurological: Negative for dizziness, loss of consciousness and headaches.  Endo/Heme/Allergies: Negative for environmental allergies.  Psychiatric/Behavioral: Positive for depression. The patient is not nervous/anxious.        Objective:    Physical Exam Vitals and nursing note reviewed.  Constitutional:      General: She is not in acute distress.    Appearance: She is well-developed.  HENT:     Head: Normocephalic and atraumatic.     Nose: Nose normal.  Eyes:     General:        Right eye: No discharge.        Left eye: No discharge.  Cardiovascular:     Rate and Rhythm: Normal rate and regular rhythm.     Heart sounds: No murmur.  Pulmonary:     Effort: Pulmonary effort is normal.     Breath sounds: Normal breath sounds.  Abdominal:     General: Bowel sounds are normal.     Palpations: Abdomen is soft.     Tenderness: There is no abdominal tenderness.  Musculoskeletal:     Cervical back:  Normal range of motion and neck supple.  Skin:    General: Skin is warm and dry.  Neurological:     Mental Status: She is alert and oriented to person, place, and time.     BP 130/84 (BP Location: Left Arm, Patient Position: Sitting, Cuff Size: Normal)   Pulse 96   Temp (!) 95.4 F (35.2 C) (Temporal)   Ht 5\' 4"  (1.626 m)   Wt 184 lb 6 oz (83.6 kg)   LMP  (LMP Unknown)   SpO2 94%   BMI 31.65 kg/m  Wt Readings from Last 3 Encounters:  04/26/20 184 lb 6 oz (83.6 kg)  02/27/20 184 lb (83.5 kg)  01/26/20 192 lb 2 oz (87.1 kg)    Diabetic Foot Exam - Simple   No data filed     Lab Results  Component Value Date   WBC 9.2 04/26/2020   HGB 13.6 04/26/2020   HCT 40.6 04/26/2020   PLT 301.0 04/26/2020   GLUCOSE 104 (H) 04/26/2020   CHOL 174 04/26/2020   TRIG 287.0 (H) 04/26/2020   HDL 52.20 04/26/2020   LDLDIRECT 72.0 04/26/2020   LDLCALC 118 (H) 10/22/2018   ALT 19 04/26/2020   AST 17 04/26/2020   NA 138 04/26/2020   K 4.5 04/26/2020   CL 103 04/26/2020   CREATININE 0.79 04/26/2020   BUN 14 04/26/2020   CO2 26 04/26/2020   TSH 1.32 04/26/2020   INR 1.0 11/09/2008   HGBA1C 6.0 04/26/2020    Lab Results  Component Value Date   TSH 1.32 04/26/2020   Lab Results  Component Value Date   WBC 9.2 04/26/2020   HGB 13.6 04/26/2020   HCT 40.6 04/26/2020   MCV 84.7  04/26/2020   PLT 301.0 04/26/2020   Lab Results  Component Value Date   NA 138 04/26/2020   K 4.5 04/26/2020   CO2 26 04/26/2020   GLUCOSE 104 (H) 04/26/2020   BUN 14 04/26/2020   CREATININE 0.79 04/26/2020   BILITOT 0.4 04/26/2020   ALKPHOS 70 04/26/2020   AST 17 04/26/2020   ALT 19 04/26/2020   PROT 7.1 04/26/2020   ALBUMIN 4.5 04/26/2020   CALCIUM 9.6 04/26/2020   ANIONGAP 10 01/29/2019   GFR 73.80 04/26/2020   Lab Results  Component Value Date   CHOL 174 04/26/2020   Lab Results  Component Value Date   HDL 52.20 04/26/2020   Lab Results  Component Value Date   LDLCALC 118 (H)  10/22/2018   Lab Results  Component Value Date   TRIG 287.0 (H) 04/26/2020   Lab Results  Component Value Date   CHOLHDL 3 04/26/2020   Lab Results  Component Value Date   HGBA1C 6.0 04/26/2020       Assessment & Plan:   Problem List Items Addressed This Visit    Depression with anxiety    Still struggling with stress and exhaustion with anhedonia and not sleeping well due to diarrhea nad mind racing. No change for now will continue to monitor      Diarrhea    Intermittent and manageable       Hyperlipidemia, mixed   Relevant Orders   Lipid panel (Completed)   TSH (Completed)   Hyperglycemia - Primary    hgba1c acceptable, minimize simple carbs. Increase exercise as tolerated. Is having episodes in the middle of the night of waking with nausea, diaphoresis, anxiety, she suspects her sugars are dropping but has been checking. +      Relevant Orders   Comprehensive metabolic panel (Completed)   TSH (Completed)   Insulin, random (Completed)   Hemoglobin A1c (Completed)   Vitamin D deficiency    Takes 5000 IU daily, check level      Relevant Orders   VITAMIN D 25 Hydroxy (Vit-D Deficiency, Fractures)   VITAMIN D 25 Hydroxy (Vit-D Deficiency, Fractures) (Completed)   Neck pain    Encouraged moist heat and gentle stretching as tolerated. May try NSAIDs and prescription meds as directed and report if symptoms worsen or seek immediate care      Relevant Orders   CBC (Completed)   DG Cervical Spine 2 or 3 views (Completed)    Other Visit Diagnoses    Need for shingles vaccine       Relevant Orders   Varicella-zoster vaccine IM (Shingrix) (Completed)      I have discontinued Barnetta Chapel A. Mraz's ciprofloxacin. I am also having her start on tiZANidine. Additionally, I am having her maintain her Vitamin D3, Omega-3 Fatty Acids (FISH OIL PO), dicyclomine, metoprolol succinate, famotidine, traZODone, ondansetron, LORazepam, temazepam, rosuvastatin, colesevelam,  metroNIDAZOLE, and pantoprazole.  Meds ordered this encounter  Medications  . tiZANidine (ZANAFLEX) 2 MG tablet    Sig: Take 0.5-2 tablets (1-4 mg total) by mouth every 6 (six) hours as needed for muscle spasms.    Dispense:  40 tablet    Refill:  1     Penni Homans, MD

## 2020-05-10 ENCOUNTER — Other Ambulatory Visit: Payer: Self-pay | Admitting: Cardiovascular Disease

## 2020-05-10 ENCOUNTER — Other Ambulatory Visit: Payer: Self-pay | Admitting: Family Medicine

## 2020-05-11 ENCOUNTER — Other Ambulatory Visit: Payer: Self-pay

## 2020-05-11 ENCOUNTER — Other Ambulatory Visit: Payer: Self-pay | Admitting: Family Medicine

## 2020-05-30 ENCOUNTER — Encounter: Payer: Self-pay | Admitting: Family Medicine

## 2020-05-31 ENCOUNTER — Other Ambulatory Visit: Payer: Self-pay | Admitting: Family Medicine

## 2020-05-31 MED ORDER — PERMETHRIN 5 % EX CREA: 1.0000 "application " | TOPICAL_CREAM | Freq: Once | CUTANEOUS | 0 refills | Status: DC

## 2020-05-31 MED FILL — PERMETHRIN 5 % CREA: 5 | 1 days supply | Qty: 60 | Fill #0

## 2020-06-08 ENCOUNTER — Other Ambulatory Visit: Payer: Self-pay | Admitting: Family Medicine

## 2020-06-08 ENCOUNTER — Other Ambulatory Visit: Payer: Self-pay | Admitting: Physician Assistant

## 2020-06-08 ENCOUNTER — Telehealth: Payer: Self-pay | Admitting: Family Medicine

## 2020-06-08 MED ORDER — PERMETHRIN 5 % EX CREA: 1.0000 "application " | TOPICAL_CREAM | Freq: Once | CUTANEOUS | 0 refills | Status: AC

## 2020-06-08 MED FILL — TEMAZEPAM 30 MG CAPSULE: 30 | 30 days supply | Qty: 30 | Fill #2

## 2020-06-08 MED FILL — LORazepam 1 MG TABS: 1 | 23 days supply | Qty: 70 | Fill #2

## 2020-06-08 MED FILL — COLESEVELAM HCL 625 MG TABS: 625 | 30 days supply | Qty: 180 | Fill #0

## 2020-06-08 MED FILL — ROSUVASTATIN CALCIUM 5 MG T: 5 | 90 days supply | Qty: 90 | Fill #1

## 2020-06-08 NOTE — Telephone Encounter (Signed)
Medication:permethrin (ELIMITE) 5 % cream [[id number]] ENDED  Has the patient contacted their pharmacy? No. (If no, request that the patient contact the pharmacy for the refill.) (If yes, when and what did the pharmacy advise?)  Preferred Pharmacy (with phone number or street name): [place], [place] - [address]  [place], [place] [address]  Phone:  [phone] Fax:  [phone]  DEA #:  -- Agent: Please be advised that RX refills may take up to 3 business days. We ask that you follow-up with your pharmacy.

## 2020-06-08 NOTE — Telephone Encounter (Signed)
I have refilled 

## 2020-06-08 NOTE — Telephone Encounter (Signed)
You filled this last on 05/31/20.  Patient sent a mychart message.

## 2020-06-09 MED ORDER — PERMETHRIN 5 % EX CREA: 1.0000 "application " | TOPICAL_CREAM | Freq: Once | CUTANEOUS | 0 refills | Status: AC

## 2020-06-09 MED FILL — PERMETHRIN 5 % CREA: 5 | 30 days supply | Qty: 120 | Fill #0

## 2020-06-09 NOTE — Telephone Encounter (Signed)
Patient needed a more cream than the 60g tube because last tube did not cover body.  New rx sent in for 2 tubes.

## 2020-06-29 ENCOUNTER — Other Ambulatory Visit: Payer: Self-pay | Admitting: Family Medicine

## 2020-06-29 MED FILL — PANTOPRAZOLE SOD DR 40 MG T: 40 | 90 days supply | Qty: 180 | Fill #1

## 2020-06-29 MED FILL — metFORMIN HCL ER 500 MG TB2: 500 | 30 days supply | Qty: 60 | Fill #1

## 2020-07-12 MED FILL — tiZANidine HCL 2 MG TABS: 2 | 5 days supply | Qty: 40 | Fill #1

## 2020-08-02 ENCOUNTER — Other Ambulatory Visit: Payer: Self-pay | Admitting: Family Medicine

## 2020-08-02 NOTE — Telephone Encounter (Signed)
Last written: 01/26/20 both meds Last ov: 04/26/20 Next ov: 09/14/20 Contract: will get at next visit UDS: will get at next visit

## 2020-08-03 ENCOUNTER — Other Ambulatory Visit: Payer: Self-pay | Admitting: Family Medicine

## 2020-08-03 MED FILL — LORazepam 1 MG TABS: 1 | 23 days supply | Qty: 70 | Fill #0

## 2020-08-05 MED FILL — TEMAZEPAM 30 MG CAPSULE: 30 | 30 days supply | Qty: 30 | Fill #0

## 2020-08-31 ENCOUNTER — Other Ambulatory Visit (HOSPITAL_COMMUNITY): Payer: Self-pay | Admitting: Family Medicine

## 2020-08-31 DIAGNOSIS — Z1231 Encounter for screening mammogram for malignant neoplasm of breast: Secondary | ICD-10-CM

## 2020-09-02 MED FILL — TEMAZEPAM 30 MG CAPSULE: 30 | 30 days supply | Qty: 30 | Fill #1

## 2020-09-02 MED FILL — LORazepam 1 MG TABS: 1 | 23 days supply | Qty: 70 | Fill #1

## 2020-09-02 MED FILL — FAMOTIDINE 40 MG TABS: 40 | 30 days supply | Qty: 60 | Fill #4

## 2020-09-14 ENCOUNTER — Other Ambulatory Visit: Payer: Self-pay | Admitting: Family Medicine

## 2020-09-14 ENCOUNTER — Ambulatory Visit: Payer: 59 | Admitting: Family Medicine

## 2020-09-14 ENCOUNTER — Encounter: Payer: Self-pay | Admitting: Family Medicine

## 2020-09-14 ENCOUNTER — Other Ambulatory Visit: Payer: Self-pay

## 2020-09-14 VITALS — BP 128/62 | HR 76 | Temp 97.6°F | Resp 13 | Ht 64.0 in | Wt 190.2 lb

## 2020-09-14 DIAGNOSIS — D649 Anemia, unspecified: Secondary | ICD-10-CM

## 2020-09-14 DIAGNOSIS — E559 Vitamin D deficiency, unspecified: Secondary | ICD-10-CM

## 2020-09-14 DIAGNOSIS — M1990 Unspecified osteoarthritis, unspecified site: Secondary | ICD-10-CM

## 2020-09-14 DIAGNOSIS — F418 Other specified anxiety disorders: Secondary | ICD-10-CM

## 2020-09-14 DIAGNOSIS — R000 Tachycardia, unspecified: Secondary | ICD-10-CM | POA: Diagnosis not present

## 2020-09-14 DIAGNOSIS — E782 Mixed hyperlipidemia: Secondary | ICD-10-CM

## 2020-09-14 DIAGNOSIS — Z2839 Other underimmunization status: Secondary | ICD-10-CM

## 2020-09-14 DIAGNOSIS — K5792 Diverticulitis of intestine, part unspecified, without perforation or abscess without bleeding: Secondary | ICD-10-CM

## 2020-09-14 DIAGNOSIS — E2839 Other primary ovarian failure: Secondary | ICD-10-CM

## 2020-09-14 DIAGNOSIS — R739 Hyperglycemia, unspecified: Secondary | ICD-10-CM | POA: Diagnosis not present

## 2020-09-14 DIAGNOSIS — M791 Myalgia, unspecified site: Secondary | ICD-10-CM | POA: Diagnosis not present

## 2020-09-14 DIAGNOSIS — M79641 Pain in right hand: Secondary | ICD-10-CM

## 2020-09-14 DIAGNOSIS — R109 Unspecified abdominal pain: Secondary | ICD-10-CM | POA: Diagnosis not present

## 2020-09-14 DIAGNOSIS — Z780 Asymptomatic menopausal state: Secondary | ICD-10-CM

## 2020-09-14 DIAGNOSIS — Z23 Encounter for immunization: Secondary | ICD-10-CM

## 2020-09-14 DIAGNOSIS — R413 Other amnesia: Secondary | ICD-10-CM

## 2020-09-14 DIAGNOSIS — M79642 Pain in left hand: Secondary | ICD-10-CM | POA: Diagnosis not present

## 2020-09-14 DIAGNOSIS — Z283 Underimmunization status: Secondary | ICD-10-CM

## 2020-09-14 MED ORDER — MELOXICAM 7.5 MG PO TABS: 7.5000 mg | ORAL_TABLET | Freq: Every day | ORAL | 2 refills | Status: DC | PRN

## 2020-09-14 MED ORDER — CIPROFLOXACIN HCL 500 MG PO TABS: 500.0000 mg | ORAL_TABLET | Freq: Two times a day (BID) | ORAL | 0 refills | Status: DC

## 2020-09-14 MED ORDER — METRONIDAZOLE 500 MG PO TABS: 500.0000 mg | ORAL_TABLET | Freq: Three times a day (TID) | ORAL | 0 refills | Status: DC

## 2020-09-14 MED ORDER — SERTRALINE HCL 25 MG PO TABS: 25.0000 mg | ORAL_TABLET | Freq: Every day | ORAL | 3 refills | Status: DC

## 2020-09-14 MED FILL — MELOXICAM 7.5 MG TABLET: 7.5 | 22 days supply | Qty: 45 | Fill #0

## 2020-09-14 MED FILL — METRONIDAZOLE 500 MG TABS: 500 | 7 days supply | Qty: 21 | Fill #0

## 2020-09-14 MED FILL — metFORMIN HCL ER 500 MG TB2: 500 | 30 days supply | Qty: 60 | Fill #2

## 2020-09-14 MED FILL — SERTRALINE HCL 25 MG TABLET: 25 | 30 days supply | Qty: 30 | Fill #0

## 2020-09-14 MED FILL — CIPROFLOXACIN HCL 500 MG TA: 500 | 10 days supply | Qty: 20 | Fill #0

## 2020-09-14 NOTE — Patient Instructions (Signed)
Diverticulitis  Diverticulitis is infection or inflammation of small pouches (diverticula) in the colon that form due to a condition called diverticulosis. Diverticula can trap stool (feces) and bacteria, causing infection and inflammation. Diverticulitis may cause severe stomach pain and diarrhea. It may lead to tissue damage in the colon that causes bleeding. The diverticula may also burst (rupture) and cause infected stool to enter other areas of the abdomen. Complications of diverticulitis can include:  Bleeding.  Severe infection.  Severe pain.  Rupture (perforation) of the colon.  Blockage (obstruction) of the colon. What are the causes? This condition is caused by stool becoming trapped in the diverticula, which allows bacteria to grow in the diverticula. This leads to inflammation and infection. What increases the risk? You are more likely to develop this condition if:  You have diverticulosis. The risk for diverticulosis increases if: ? You are overweight or obese. ? You use tobacco products. ? You do not get enough exercise.  You eat a diet that does not include enough fiber. High-fiber foods include fruits, vegetables, beans, nuts, and whole grains. What are the signs or symptoms? Symptoms of this condition may include:  Pain and tenderness in the abdomen. The pain is normally located on the left side of the abdomen, but it may occur in other areas.  Fever and chills.  Bloating.  Cramping.  Nausea.  Vomiting.  Changes in bowel routines.  Blood in your stool. How is this diagnosed? This condition is diagnosed based on:  Your medical history.  A physical exam.  Tests to make sure there is nothing else causing your condition. These tests may include: ? Blood tests. ? Urine tests. ? Imaging tests of the abdomen, including X-rays, ultrasounds, MRIs, or CT scans. How is this treated? Most cases of this condition are mild and can be treated at home.  Treatment may include:  Taking over-the-counter pain medicines.  Following a clear liquid diet.  Taking antibiotic medicines by mouth.  Rest. More severe cases may need to be treated at a hospital. Treatment may include:  Not eating or drinking.  Taking prescription pain medicine.  Receiving antibiotic medicines through an IV tube.  Receiving fluids and nutrition through an IV tube.  Surgery. When your condition is under control, your health care provider may recommend that you have a colonoscopy. This is an exam to look at the entire large intestine. During the exam, a lubricated, bendable tube is inserted into the anus and then passed into the rectum, colon, and other parts of the large intestine. A colonoscopy can show how severe your diverticula are and whether something else may be causing your symptoms. Follow these instructions at home: Medicines  Take over-the-counter and prescription medicines only as told by your health care provider. These include fiber supplements, probiotics, and stool softeners.  If you were prescribed an antibiotic medicine, take it as told by your health care provider. Do not stop taking the antibiotic even if you start to feel better.  Do not drive or use heavy machinery while taking prescription pain medicine. General instructions   Follow a full liquid diet or another diet as directed by your health care provider. After your symptoms improve, your health care provider may tell you to change your diet. He or she may recommend that you eat a diet that contains at least 25 g (25 grams) of fiber daily. Fiber makes it easier to pass stool. Healthy sources of fiber include: ? Berries. One cup contains 4-8 grams of   fiber. ? Beans or lentils. One half cup contains 5-8 grams of fiber. ? Green vegetables. One cup contains 4 grams of fiber.  Exercise for at least 30 minutes, 3 times each week. You should exercise hard enough to raise your heart rate and  break a sweat.  Keep all follow-up visits as told by your health care provider. This is important. You may need a colonoscopy. Contact a health care provider if:  Your pain does not improve.  You have a hard time drinking or eating food.  Your bowel movements do not return to normal. Get help right away if:  Your pain gets worse.  Your symptoms do not get better with treatment.  Your symptoms suddenly get worse.  You have a fever.  You vomit more than one time.  You have stools that are bloody, black, or tarry. Summary  Diverticulitis is infection or inflammation of small pouches (diverticula) in the colon that form due to a condition called diverticulosis. Diverticula can trap stool (feces) and bacteria, causing infection and inflammation.  You are at higher risk for this condition if you have diverticulosis and you eat a diet that does not include enough fiber.  Most cases of this condition are mild and can be treated at home. More severe cases may need to be treated at a hospital.  When your condition is under control, your health care provider may recommend that you have an exam called a colonoscopy. This exam can show how severe your diverticula are and whether something else may be causing your symptoms. This information is not intended to replace advice given to you by your health care provider. Make sure you discuss any questions you have with your health care provider. Document Revised: 11/02/2017 Document Reviewed: 12/23/2016 Elsevier Patient Education  2020 [employer]  

## 2020-09-15 ENCOUNTER — Encounter: Payer: Self-pay | Admitting: Counselor

## 2020-09-15 ENCOUNTER — Encounter: Payer: Self-pay | Admitting: Family Medicine

## 2020-09-15 ENCOUNTER — Other Ambulatory Visit: Payer: Self-pay

## 2020-09-15 DIAGNOSIS — M1990 Unspecified osteoarthritis, unspecified site: Secondary | ICD-10-CM | POA: Insufficient documentation

## 2020-09-15 DIAGNOSIS — E782 Mixed hyperlipidemia: Secondary | ICD-10-CM

## 2020-09-15 MED ORDER — ATORVASTATIN CALCIUM 10 MG PO TABS: 10.0000 mg | ORAL_TABLET | Freq: Every day | ORAL | 1 refills | Status: DC

## 2020-09-15 MED FILL — ATORVASTATIN CALCIUM 10 MG: 10 | 90 days supply | Qty: 90 | Fill #0

## 2020-09-15 NOTE — Assessment & Plan Note (Signed)
Supplement and monitor 

## 2020-09-15 NOTE — Progress Notes (Signed)
Subjective:    Patient ID: [name] Jensen, female    DOB: May 22, 1956, 64 y.o.   MRN: [id number]  Chief Complaint  Patient presents with  . UDS/Contract/Followup  . Burning sensation when voiding-day2    HPI Patient is in today for follow up on chronic medical concerns. No recent febrile illness or hospitalizations. She is very stressed and tired. She notes her anxiety and depression are flared. Work has been exceedingly stressful. Denies CP/palp/SOB/HA/congestion/fevers/GI or GU c/o. Taking meds as prescribed  Past Medical History:  Diagnosis Date  . Adjustment reaction with anxiety and depression 06/12/2014  . Anemia    h/o low ferritin  . Anxiety   . Asthma    environmental triggers  . Chicken pox as a child  . Cough 08/22/2015  . Depression   . Depression with anxiety 06/12/2014  . Diverticulosis   . Ehrlichiosis 8841  . Gallstones   . GERD (gastroesophageal reflux disease)   . Hair loss 03/28/2017  . Hiatal hernia 2007  . History of viral illness   . Hyperlipidemia, mixed 04/11/2015  . Insomnia 08/21/2017  . Lactose intolerance 06/12/2014  . Mumps as a child  . Overweight 08/19/2014  . Rectocele 06/12/2014  . RLS (restless legs syndrome)   . 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 Sessile colonic polyp   . SVT (supraventricular tachycardia) ([place])   . Tachycardia 06/12/2014  . Thyroid disease   . Vitamin D deficiency 08/22/2015    Past Surgical History:  Procedure Laterality Date  . ABDOMINAL HYSTERECTOMY  11-2008   total with cystocele, rectocele repair  . APPENDECTOMY    . CHOLECYSTECTOMY    . COLONOSCOPY  2006   diverticulosis, hemorrhoids ([place])  . ESOPHAGOGASTRODUODENOSCOPY  2006   small hiatus hernia ([place])  . ESOPHAGOGASTRODUODENOSCOPY N/A 02/21/2018   Procedure: ESOPHAGOGASTRODUODENOSCOPY (EGD);  Surgeon: Rogene Houston, MD;  Location: [facility];  Service: Endoscopy;  Laterality: N/A;  pt knows to arrive at 2:45  .  INCONTINENCE SURGERY    . SKIN SURGERY     right arm scc  . svt ablation    . TONSILLECTOMY  1972   2ith adenoid  . WISDOM TOOTH EXTRACTION  64 yrs old    Family History  Problem Relation Age of Onset  . Hypertension Mother   . Hypertension Father   . Hypertension Sister   . Diabetes Sister        type 2  . Fibromyalgia Sister   . GI Bleed Maternal Grandmother   . Atrial fibrillation Maternal Grandmother   . Heart disease Maternal Grandfather   . Diabetes Paternal Grandmother   . Stroke Paternal Grandfather   . Proteinuria Sister   . Colon cancer Neg Hx   . Esophageal cancer Neg Hx   . Rectal cancer Neg Hx   . Stomach cancer Neg Hx     Social History   Socioeconomic History  . Marital status: Divorced    Spouse name: Not on file  . Number of children: 2  . Years of education: Not on file  . Highest education level: Not on file  Occupational History  . Occupation: Programmer, multimedia: [employer]  Tobacco Use  . Smoking status: Never Smoker  . Smokeless tobacco: Never Used  Vaping Use  . Vaping Use: Never used  Substance and Sexual Activity  . Alcohol use: No    Alcohol/week: 0.0 standard drinks  . Drug use: No  .  Sexual activity: Not on file    Comment: lives by self with 4 dogs works at [employer], avoids dairy  Other Topics Concern  . Not on file  Social History Narrative  . Not on file   Social Determinants of Health   Financial Resource Strain:   . Difficulty of Paying Living Expenses: Not on file  Food Insecurity:   . Worried About Charity fundraiser in the Last Year: Not on file  . Ran Out of Food in the Last Year: Not on file  Transportation Needs:   . Lack of Transportation (Medical): Not on file  . Lack of Transportation (Non-Medical): Not on file  Physical Activity:   . Days of Exercise per Week: Not on file  . Minutes of Exercise per Session: Not on file  Stress:   . Feeling of Stress : Not on file  Social Connections:   . Frequency of  Communication with Friends and Family: Not on file  . Frequency of Social Gatherings with Friends and Family: Not on file  . Attends Religious Services: Not on file  . Active Member of Clubs or Organizations: Not on file  . Attends Archivist Meetings: Not on file  . Marital Status: Not on file  Intimate Partner Violence:   . Fear of Current or Ex-Partner: Not on file  . Emotionally Abused: Not on file  . Physically Abused: Not on file  . Sexually Abused: Not on file    Outpatient Medications Prior to Visit  Medication Sig Dispense Refill  . Cholecalciferol (VITAMIN D3) 5000 units CAPS Take 5,000 Units by mouth daily.     . colesevelam (WELCHOL) 625 MG tablet TAKE 3 TABLETS BY MOUTH TWICE DAILY WITH MEALS 180 tablet 0  . dicyclomine (BENTYL) 10 MG capsule Take 1 capsule (10 mg total) by mouth 3 (three) times daily before meals. 270 capsule 3  . famotidine (PEPCID) 40 MG tablet Take 1 tablet (40 mg total) by mouth 2 (two) times daily. 60 tablet 5  . LORazepam (ATIVAN) 1 MG tablet TAKE 1/2 TO 1 TABLET BY MOUTH EVERY 8 HOURS AS NEEDED FOR ANXIETY 70 tablet 2  . metFORMIN (GLUCOPHAGE XR) 500 MG 24 hr tablet Take 1 tablet (500 mg total) by mouth in the morning and at bedtime. 60 tablet 3  . metoprolol succinate (TOPROL-XL) 100 MG 24 hr tablet TAKE 1 TABLET BY MOUTH TWICE A DAY.TAKE WITH OR IMMEDIATELY FOLLOWING A MEAL. 180 tablet 2  . Omega-3 Fatty Acids (FISH OIL PO) Take 2,000 mg by mouth 2 (two) times daily.    . pantoprazole (PROTONIX) 40 MG tablet TAKE 1 TABLET (40 MG TOTAL) BY MOUTH TWICE A DAY BEFORE A MEAL. 180 tablet 2  . temazepam (RESTORIL) 30 MG capsule TAKE 1 CAPSULE BY MOUTH ONCE DAILY AT BEDTIME 30 capsule 5  . tiZANidine (ZANAFLEX) 2 MG tablet Take 0.5-2 tablets (1-4 mg total) by mouth every 6 (six) hours as needed for muscle spasms. 40 tablet 1  . traZODone (DESYREL) 150 MG tablet TAKE 1 TABLET BY MOUTH AT BEDTIME. 90 tablet 1  . metroNIDAZOLE (FLAGYL) 500 MG tablet  Take 500 mg by mouth 3 (three) times daily.    . ondansetron (ZOFRAN) 4 MG tablet Take 1 tablet (4 mg total) by mouth every 6 (six) hours as needed for nausea or vomiting. 40 tablet 1  . rosuvastatin (CRESTOR) 5 MG tablet Take 1 tablet (5 mg total) by mouth daily. 90 tablet 1  No facility-administered medications prior to visit.    Allergies  Allergen Reactions  . Dilaudid [Hydromorphone Hcl] Other (See Comments)    Respiratory , cardiac     Review of Systems  Constitutional: Positive for malaise/fatigue. Negative for fever.  HENT: Negative for congestion.   Eyes: Negative for blurred vision.  Respiratory: Negative for shortness of breath.   Cardiovascular: Negative for chest pain, palpitations and leg swelling.  Gastrointestinal: Positive for abdominal pain. Negative for blood in stool and nausea.  Genitourinary: Negative for dysuria and frequency.  Musculoskeletal: Positive for back pain, joint pain, myalgias and neck pain. Negative for falls.  Skin: Negative for rash.  Neurological: Negative for dizziness, loss of consciousness and headaches.  Endo/Heme/Allergies: Negative for environmental allergies.  Psychiatric/Behavioral: Positive for depression. Negative for suicidal ideas. The patient is nervous/anxious.        Objective:    Physical Exam Vitals and nursing note reviewed.  Constitutional:      General: She is not in acute distress.    Appearance: She is well-developed.  HENT:     Head: Normocephalic and atraumatic.     Nose: Nose normal.  Eyes:     General:        Right eye: No discharge.        Left eye: No discharge.  Cardiovascular:     Rate and Rhythm: Normal rate and regular rhythm.     Heart sounds: No murmur heard.   Pulmonary:     Effort: Pulmonary effort is normal.     Breath sounds: Normal breath sounds.  Abdominal:     General: Bowel sounds are normal.     Palpations: Abdomen is soft.     Tenderness: There is no abdominal tenderness.    Musculoskeletal:     Cervical back: Normal range of motion and neck supple.  Skin:    General: Skin is warm and dry.  Neurological:     Mental Status: She is alert and oriented to person, place, and time.     BP 128/62 (BP Location: Right Arm, Patient Position: Sitting, Cuff Size: Small)   Pulse 76   Temp 97.6 F (36.4 C) (Oral)   Resp 13   Ht 5\' 4"  (1.626 m)   Wt 190 lb 3.2 oz (86.3 kg)   LMP  (LMP Unknown)   SpO2 97%   BMI 32.65 kg/m  Wt Readings from Last 3 Encounters:  09/14/20 190 lb 3.2 oz (86.3 kg)  04/26/20 184 lb 6 oz (83.6 kg)  02/27/20 184 lb (83.5 kg)    Diabetic Foot Exam - Simple   No data filed     Lab Results  Component Value Date   WBC 6.7 09/14/2020   HGB 13.5 09/14/2020   HCT 39.5 09/14/2020   PLT 285 09/14/2020   GLUCOSE 110 (H) 09/14/2020   CHOL 257 (H) 09/14/2020   TRIG 232 (H) 09/14/2020   HDL 58 09/14/2020   LDLDIRECT 72.0 04/26/2020   LDLCALC 160 (H) 09/14/2020   ALT 21 09/14/2020   AST 17 09/14/2020   NA 138 09/14/2020   K 4.4 09/14/2020   CL 105 09/14/2020   CREATININE 0.63 09/14/2020   BUN 11 09/14/2020   CO2 22 09/14/2020   TSH 1.41 09/14/2020   INR 1.0 11/09/2008   HGBA1C 6.0 (H) 09/14/2020    Lab Results  Component Value Date   TSH 1.41 09/14/2020   Lab Results  Component Value Date   WBC 6.7 09/14/2020   HGB  13.5 09/14/2020   HCT 39.5 09/14/2020   MCV 83.3 09/14/2020   PLT 285 09/14/2020   Lab Results  Component Value Date   NA 138 09/14/2020   K 4.4 09/14/2020   CO2 22 09/14/2020   GLUCOSE 110 (H) 09/14/2020   BUN 11 09/14/2020   CREATININE 0.63 09/14/2020   BILITOT 0.5 09/14/2020   ALKPHOS 70 04/26/2020   AST 17 09/14/2020   ALT 21 09/14/2020   PROT 7.3 09/14/2020   ALBUMIN 4.5 04/26/2020   CALCIUM 9.2 09/14/2020   ANIONGAP 10 01/29/2019   GFR 73.80 04/26/2020   Lab Results  Component Value Date   CHOL 257 (H) 09/14/2020   Lab Results  Component Value Date   HDL 58 09/14/2020   Lab Results   Component Value Date   LDLCALC 160 (H) 09/14/2020   Lab Results  Component Value Date   TRIG 232 (H) 09/14/2020   Lab Results  Component Value Date   CHOLHDL 4.4 09/14/2020   Lab Results  Component Value Date   HGBA1C 6.0 (H) 09/14/2020       Assessment & Plan:   Problem List Items Addressed This Visit    Anemia   Relevant Orders   CBC (Completed)   Tachycardia   Relevant Orders   TSH (Completed)   VITAMIN D 25 Hydroxy (Vit-D Deficiency, Fractures) (Completed)   Depression with anxiety    Flared recently. Will add Sertraline 25 mg daily. Continue Trazodone at same dose      Relevant Medications   sertraline (ZOLOFT) 25 MG tablet   Abdominal pain   Hyperlipidemia, mixed    Encouraged heart healthy diet, increase exercise, avoid trans fats, consider a krill oil cap daily      Relevant Orders   Lipid panel (Completed)   Hyperglycemia    hgba1c acceptable, minimize simple carbs. Increase exercise as tolerated.       Relevant Orders   Insulin, random (Completed)   Hemoglobin A1c (Completed)   Comprehensive metabolic panel (Completed)   Vitamin D deficiency    Supplement and monitor      Diverticulitis    Had a recent flare and responded to Ciprofloxacin and Metronidazole. She is given a refill on both meds to have on hand if she has another flare.       Arthritis    Patient is complaining of bilateral hand pain left>right  With some swelling but no warmth or redness, she also notes generalized myalgias and diffuse arthralgias as well. She stopped Crestor to see if that helped her pain but it did not. Labs are unremarkable. She is given a prescription for Meloxicam 7.5 mg tabs, 1-2 tabs daily prn      Relevant Medications   meloxicam (MOBIC) 7.5 MG tablet    Other Visit Diagnoses    Need for shingles vaccine    -  Primary   Relevant Orders   Varicella-zoster vaccine IM (Shingrix) (Completed)   Bilateral hand pain       Relevant Orders   Sedimentation  rate (Completed)   CRP High sensitivity (Completed)   Rheumatoid Factor (Completed)   Antinuclear Antib (ANA)   Myalgia       Memory loss       Relevant Orders   Ambulatory referral to Neuropsychology   Estrogen deficiency       Relevant Orders   DG Bone Density   Post-menopausal       Relevant Orders   DG Bone Density  Not up to date with tetanus toxoid immunization       Relevant Orders   Tdap vaccine greater than or equal to 7yo IM (Completed)      I have discontinued Barnetta Chapel A. Vanbeek's ondansetron. I am also having her start on meloxicam and sertraline. Additionally, I am having her maintain her Vitamin D3, Omega-3 Fatty Acids (FISH OIL PO), dicyclomine, famotidine, pantoprazole, tiZANidine, metFORMIN, metoprolol succinate, traZODone, colesevelam, LORazepam, temazepam, ciprofloxacin, and metroNIDAZOLE.  Meds ordered this encounter  Medications  . ciprofloxacin (CIPRO) 500 MG tablet    Sig: Take 1 tablet (500 mg total) by mouth 2 (two) times daily.    Dispense:  20 tablet    Refill:  0  . metroNIDAZOLE (FLAGYL) 500 MG tablet    Sig: Take 1 tablet (500 mg total) by mouth 3 (three) times daily.    Dispense:  21 tablet    Refill:  0  . meloxicam (MOBIC) 7.5 MG tablet    Sig: Take 1-2 tablets (7.5-15 mg total) by mouth daily as needed for pain.    Dispense:  45 tablet    Refill:  2  . sertraline (ZOLOFT) 25 MG tablet    Sig: Take 1 tablet (25 mg total) by mouth daily.    Dispense:  30 tablet    Refill:  3     Penni Homans, MD

## 2020-09-15 NOTE — Assessment & Plan Note (Signed)
Had a recent flare and responded to Ciprofloxacin and Metronidazole. She is given a refill on both meds to have on hand if she has another flare.

## 2020-09-15 NOTE — Assessment & Plan Note (Addendum)
Flared recently. Will add Sertraline 25 mg daily. Continue Trazodone at same dose

## 2020-09-15 NOTE — Assessment & Plan Note (Signed)
hgba1c acceptable, minimize simple carbs. Increase exercise as tolerated.  

## 2020-09-15 NOTE — Assessment & Plan Note (Signed)
Encouraged heart healthy diet, increase exercise, avoid trans fats, consider a krill oil cap daily 

## 2020-09-15 NOTE — Assessment & Plan Note (Signed)
Patient is complaining of bilateral hand pain left>right  With some swelling but no warmth or redness, she also notes generalized myalgias and diffuse arthralgias as well. She stopped Crestor to see if that helped her pain but it did not. Labs are unremarkable. She is given a prescription for Meloxicam 7.5 mg tabs, 1-2 tabs daily prn

## 2020-09-17 LAB — COMPREHENSIVE METABOLIC PANEL
AG Ratio: 1.6 (calc) (ref 1.0–2.5)
ALT: 21 U/L (ref 6–29)
AST: 17 U/L (ref 10–35)
Albumin: 4.5 g/dL (ref 3.6–5.1)
Alkaline phosphatase (APISO): 61 U/L (ref 37–153)
BUN: 11 mg/dL (ref 7–25)
CO2: 22 mmol/L (ref 20–32)
Calcium: 9.2 mg/dL (ref 8.6–10.4)
Chloride: 105 mmol/L (ref 98–110)
Creat: 0.63 mg/dL (ref 0.50–0.99)
Globulin: 2.8 g/dL (calc) (ref 1.9–3.7)
Glucose, Bld: 110 mg/dL — ABNORMAL HIGH (ref 65–99)
Potassium: 4.4 mmol/L (ref 3.5–5.3)
Sodium: 138 mmol/L (ref 135–146)
Total Bilirubin: 0.5 mg/dL (ref 0.2–1.2)
Total Protein: 7.3 g/dL (ref 6.1–8.1)

## 2020-09-17 LAB — HIGH SENSITIVITY CRP: hs-CRP: 2.3 mg/L

## 2020-09-17 LAB — LIPID PANEL
Cholesterol: 257 mg/dL — ABNORMAL HIGH (ref <200)
HDL: 58 mg/dL (ref >=50)
LDL Cholesterol (Calc): 160 mg/dL (calc) — ABNORMAL HIGH
Non-HDL Cholesterol (Calc): 199 mg/dL (calc) — ABNORMAL HIGH (ref <130)
Total CHOL/HDL Ratio: 4.4 (calc) (ref <5.0)
Triglycerides: 232 mg/dL — ABNORMAL HIGH (ref <150)

## 2020-09-17 LAB — ANA: Anti Nuclear Antibody (ANA): NEGATIVE

## 2020-09-17 LAB — INSULIN, RANDOM: Insulin: 29.9 u[IU]/mL — ABNORMAL HIGH

## 2020-09-17 LAB — HEMOGLOBIN A1C
Hgb A1c MFr Bld: 6 % of total Hgb — ABNORMAL HIGH (ref <5.7)
Mean Plasma Glucose: 126 (calc)
eAG (mmol/L): 7 (calc)

## 2020-09-17 LAB — CBC
HCT: 39.5 % (ref 35.0–45.0)
Hemoglobin: 13.5 g/dL (ref 11.7–15.5)
MCH: 28.5 pg (ref 27.0–33.0)
MCHC: 34.2 g/dL (ref 32.0–36.0)
MCV: 83.3 fL (ref 80.0–100.0)
MPV: 9.4 fL (ref 7.5–12.5)
Platelets: 285 10*3/uL (ref 140–400)
RBC: 4.74 10*6/uL (ref 3.80–5.10)
RDW: 12.6 % (ref 11.0–15.0)
WBC: 6.7 10*3/uL (ref 3.8–10.8)

## 2020-09-17 LAB — SEDIMENTATION RATE: Sed Rate: 14 mm/h (ref 0–30)

## 2020-09-17 LAB — VITAMIN D 25 HYDROXY (VIT D DEFICIENCY, FRACTURES): Vit D, 25-Hydroxy: 48 ng/mL (ref 30–100)

## 2020-09-17 LAB — TSH: TSH: 1.41 mIU/L (ref 0.40–4.50)

## 2020-09-17 LAB — RHEUMATOID FACTOR: Rheumatoid fact SerPl-aCnc: <14 IU/mL (ref <14)

## 2020-09-21 ENCOUNTER — Ambulatory Visit: Payer: 59 | Admitting: Counselor

## 2020-09-21 ENCOUNTER — Encounter: Payer: Self-pay | Admitting: Counselor

## 2020-09-21 ENCOUNTER — Other Ambulatory Visit: Payer: Self-pay

## 2020-09-21 ENCOUNTER — Ambulatory Visit: Payer: 59

## 2020-09-21 DIAGNOSIS — R413 Other amnesia: Secondary | ICD-10-CM

## 2020-09-21 DIAGNOSIS — F418 Other specified anxiety disorders: Secondary | ICD-10-CM | POA: Diagnosis not present

## 2020-09-21 DIAGNOSIS — R4189 Other symptoms and signs involving cognitive functions and awareness: Secondary | ICD-10-CM

## 2020-09-21 DIAGNOSIS — F09 Unspecified mental disorder due to known physiological condition: Secondary | ICD-10-CM

## 2020-09-21 NOTE — Progress Notes (Signed)
   Psychometrist Note   Cognitive testing was administered to [name] Jensen by Lamar Benes, B.S. (Technician) under the supervision of Alphonzo Severance, Psy.D., ABN. [name] was able to tolerate all test procedures. Dr. Nicole Kindred met with the patient as needed to manage any emotional reactions to the testing procedures. Rest breaks were offered.    The battery of tests administered was selected by Dr. Nicole Kindred with consideration to the patient's current level of functioning, the nature of her symptoms, emotional and behavioral responses during the interview, level of literacy, observed level of motivation/effort, and the nature of the referral question. This battery was communicated to the psychometrist. Communication between Dr. Nicole Kindred and the psychometrist was ongoing throughout the evaluation and Dr. Nicole Kindred was immediately accessible at all times. Dr. Nicole Kindred provided supervision to the technician on the date of this service, to the extent necessary to assure the quality of all services provided.    [name] will return in approximately one week for an interactive feedback session with Dr. Nicole Kindred, at which time test performance, clinical impressions, and treatment recommendations will be reviewed in detail. The patient understands she can contact our office should she require our assistance before this time.   A total of 140 minutes of billable time were spent with [name] Jensen by the technician, including test administration and scoring time. Billing for these services is reflected in Dr. Les Pou note.   This note reflects time spent with the psychometrician and does not include test scores, clinical history, or any interpretations made by Dr. Nicole Kindred. The full report will follow in a separate note.
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
## 2020-09-24 NOTE — Progress Notes (Signed)
[facility]  Patient Name: [name] Jensen MRN: [id number] Date of Birth: December 24, 1955 Age: 64 y.o. Education: 16 years  Measurement properties of test scores: IQ, Index, and Standard Scores (SS): Mean = 100; Standard Deviation = 15 Scaled Scores (Ss): Mean = 10; Standard Deviation = 3 Z scores (Z): Mean = 0; Standard Deviation = 1 T scores (T); Mean = 50; Standard Deviation = 10  TEST SCORES:    Note: This summary of test scores accompanies the interpretive report and should not be interpreted by unqualified individuals or in isolation without reference to the report. Test scores are relative to age, gender, and educational history as available and appropriate.   Performance Validity        "A" Random Letter Test Raw  Descriptor      Errors 0 Within Expectation  The Dot Counting Test: 11 Within Expectation      Mental Status Screening     Total Score Descriptor  MoCA 27 Normal      Expected Functioning        Wide Range Achievement Test: Standard/Scaled Score Percentile      Word Reading 116 86      Re[place]olds Intellectual Screening Test Standard/T-score Percentile      Guess What 57 75      Odd Item Out 63 91  RIST Index 119 90      Attention/Processing Speed        Neuropsychological Assessment Battery (Attention Module, Form 1): Scaled/T-score Percentile     Digits Forward 53 62     Digits Backwards 58 79      Repeatable Battery for the Assessment of Neuropsychological Status (Form A): Standard Score Percentile     Coding 10 50      Language        Neuropsychological Assessment Battery (Language Module, Form 1): T-score Percentile      Naming   (31) 55 69      Verbal Fluency:  T Score Percentile      Controlled Oral Word Association (F-A-S) 53 62      Semantic Fluency (Animals) 55 69      Memory:        Neuropsychological Assessment Battery (Memory Module, Form 1): T-score/Standard Score Percentile  Memory Index  (MEM): 77 6      List Learning           List A Immediate Recall   (7 , 11 , 9) 52 58         List B Immediate Recall   (5) 50 50         List A Short Delayed Recall   (3) 23 <1         List A Long Delayed Recall   (4) 29 2         List A Percent Retention   (133 %) --- 92         List A Long Delayed Yes/No Recognition Hits   (11) --- 50         List A Long Delayed Yes/No Recognition False Alarms   (4) --- 38         List A Recognition Discriminability Index --- 38      Shape Learning           Immediate Recognition   (6 , 7 , 5) 53 62         Delayed Recognition   (6) 48 42  Percent Retention   (120 %) --- 79         Delayed Forced-Choice Recognition Hits   (9) --- 86         Delayed Forced-Choice Recognition False Alarms   (1) --- 50         Delayed Forced-Choice Recognition Discriminability --- 73     Story Learning           Immediate Recall   (23, 22) 29 2         Delayed Recall   (22) 30 2         Percent Retention   (100 %) --- 69      Daily Living Memory            Immediate Recall   (24, 23) 56 73          Delayed Recall   (7, 4) 30 2          Percent Retention (69 %) --- 7          Recognition Hits   (8) --- 21      Repeatable Battery for the Assessment of Neuropsychological Status (Form A): Scaled Score Percentile         Figure Recall   (10) 8 25      Visuospatial/Constructional Functioning        Repeatable Battery for the Assessment of Neuropsychological Status (Form A): Standard/Scaled Score Percentile      Visuospatial/Constructional Index 100 50         Figure Copy   (18) 10 50         Judgment of Line Orientation   (17) --- 51-75      Executive Functioning        Modified [employer] Test (MWCST): Standard/T-Score Percentile      Number of Categories Correct 53 62      Number of Perseverative Errors 47 38      Number of Total Errors 50 50      Percent Perseverative Errors 44 27  Executive Function Composite 100 50      Trail Making  Test: T-Score Percentile      Part A 48 42      Part B 40 16      [place] Diagnostic Aphasia Exam: Raw Score Scaled Score      Complex Ideational Material 9 5      Clock Drawing Raw Score Descriptor      Command 9 WNL      Rating Scales         Raw Score Descriptor  Patient Health Questionnaire - 9 20 Severe  GAD-7 15 Severe  Geriatric Depression Scale - Short Form 11 Positive   Dalin Caldera V. Nicole Kindred PsyD, [place] Clinical Neuropsychologist

## 2020-09-27 NOTE — Progress Notes (Signed)
[facility]  Patient Name: [name] Jensen MRN: [id number] Date of Birth: May 13, 1956 Age: 64 y.o. Education: 27 yeasr  Clinical Impressions  [name] Jensen is a 64 y.o., right-hand dominant, divorced woman with a history of OSA, euthyroid hashimotos thyroiditis, HLD, prediabetes, and very significant difficutlies with depression and anxiety. She has a previous history of memory and thinking problems approximately 7 years ago after erlichiosis (successfully treated with doxycycline) that remitted after treatment and then new onset changes in memory and thinking over about the past year. Her functioning is good, she is still working as a Marine scientist and is performing adequately, but she notices diminished efficiency and minor day-to-day problems including misplacing things, problems keeping track of e-mails, and getting side tracked in the middle of tasks. She admits that these symptoms have coincided with increased stress and demands at work. She is also quite depressed, which is chronic, she has tried numerous SSRIs and other medications with minimal relief.   Findings from neuropsychological testing show diminished memory efficiency, presenting with scattered low scores and an overall unusually low performance level relative to her age and education cohort. The pattern is not concerning for a storage problem but rather, shows difficulties with retroactive interference on some measures, encoding on other measures, and generally well-maintained retention of information across time. Taken together, these findings suggest that memory problems may be on the basis of executive control factors. She performed within normal limits in all other areas. On self-report symptom questionnaires she endorsed items in a manner suggesting pronounced affective distress involving depressive symptoms and likely related somatic concerns. She reported experiencing a discomforting  level of anxiety, to an extent that her ability to concentrate and attend are likely compromised. Additionally, there are indications that she has experienced a traumatic event that continues to be a source of distress and concern.   [name] cognitive presentation can thus be summed up as reflecting problems with executive control, that are most likely on the basis of depression and anxiety, and those conditions may be trauma-related to some extent. She also demonstrates an unusual degree of concern about physical functioning and health matters. The findings are not concerning for the presence of neurodegeneration, although they certainly suggest that she is actively symptomatic of her affective issues and may benefit from treatment. Poor sleep pattern and cumulative effects of stress may also be contributing. Hashimoto's can of course be associated with cognitive difficulties but seems to go less well with the timeline, as it is a chronic condition and she is also euthyroid. Consideration is also given to the possible impact of medication side effects (e.g., ativan, pantoprazole, temazepam, tizanidine).   Diagnostic Impressions: Other symptoms and signs involving cognitive functions and awareness Major depressive disorder, recurrent, severe R/o Trauma and stressor related disorder  Recommendations to be discussed with patient  Your performance and presentation on assessment were consistent with diminished memory efficiency. The profile is not concerning for the type of memory problem that we expect to see in neurodegenerative causes of memory loss, such as Alzheimer's disease, but rather suggests that your difficulties with memory are related to diminished cognitive efficiency (I.e., executive control problems).   Executive control is a higher order cognitive ability involved in regulating other cognitive resources. Much like the conductor of an orchestra coordinates multiple instruments to make  music, executive capacities coordinate other lower-order skills (e.g., movement, language, attention) to form complex human behaviors. Individuals with executive control problems are often capable of  doing most of the things they did before they were having problems, but they may not do so as effortlessly, efficiently, and consistently. These difficulties often manifest as problems tracking information, multitasking, and paying attention. Executive control problems often result in cognitive inefficiency and can present as "memory problems," because they decrease encoding and spontaneous retrieval of information.   In your case, I think the main contributor to your executive control difficulties is your depression and anxiety. You reported a severe level of depression and anxiety symptoms and those have been worse over the period you have noticed increased cognitive difficulties, related to stress at work among other factors. I think that treating these issues should be a primary focus. Your sleep pattern is also disturbed, which is likely contributing. 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   Depression can affect cognitive functioning in several ways. For one, there are neurobiological changes in depression that can contribute to attention, concentration, and memory problems. These changes are so common they are part of the criteria we use to diagnose depressive disorders. Depression can also negatively impact your own appraisal of your cognitive abilities, leading you to feel like you are performing more poorly than is objectively warranted. In your case, I think that you would benefit from psychiatric consultation, which we can discuss. You stated that you have tried many things in the past although I think there are numerous other options. I would defer to your psychiatric  treatment providers as to whether alternative treatments you were interested in, such as ECT and ketamine, would be helpful. There is a high likelihood that your difficulties with depression and anxiety are caused or exacerbated by your previous experience of trauma, and for that I think psychotherapy is also likely to be beneficial.   There are few things as disruptive to brain functioning as not getting a good night's sleep. For sleep, I recommend against using medications, which can have lingering sedating effects on the brain and rob your brain of restful REM sleep. Instead, consider trying some of the following sleep hygiene recommendations. They may not work at once and may take effort, but the effort you spend is likely to be rewarded with better sleep eventually:  . Stick to a sleep schedule of the same bedtime and wake time even on the weekends, which can help to regulate your body's internal clock so that you fall asleep and stay asleep.  . Practice a relaxing bedtime ritual (conducted away from bright lights) which will help separate your sleep from stimulating activities and prepare your body to fall asleep when you go to bed.  . Avoid naps, especially in the afternoon.  . Evaluate your room and create conditions that will promote sleep such as keeping it cool (between 60 - 67 degrees), quiet, and free from any lights. Consider using blackout curtains, a "white noise" generator, or fan that will help mask any noises that might prevent you from going to sleep or awaken you during the night.  . Sleep on a comfortable mattress and pillows.  . Avoid bright light in the evening and excessive use of portable electronic devices right before bed that may contain light frequencies that can contribute to sleep problems.  . Avoid alcohol, cigarettes, or heavy meals in the evening. If you must eat, consume a light snack 45 minutes before bed.  . Use your bed only  for sleep to strengthen the association  between your bed and sleep.  . If you can't go to sleep within 30 minutes, go into another room and do something relaxing until you feel tired. Then, come back and try to go to sleep again for 30 minutes and repeat until sleep is achieved.  . Some people find over the counter melatonin to be helpful for sleep, which you could discuss with a pharmacist or prescribing provider.   I would like to reassure you that there are no signs you are developing a dementia. You did well in most areas and your memory problem, clinical history, and preserved functioning all argue strongly against an impression of a dementia level problem or even an incipient dementia condition.   Avoid overfocusing on cognitive performance. Memory and cognition are notoriously fallible and if you are looking for cognitive problems, you are bound to find them. Once someone gets worried about their memory and thinking, they may overfocus on how they are doing day-to-day, and then when normal day-to-day cognitive errors are made, this becomes a cause for more concern. This concern and anxiety then decreases focus from the task at hand, reducing concentration, causing more cognitive problems, and creating a vicious cycle. Rather than critiquing your performance, I would encourage you to remain present minded and focus on the task at hand. Perhaps most importantly, have reasonable expectations for yourself.  Healthy people forget things, lose focus, and do not perform 100% correctly all the time. Some cognitive errors are normal and are not necessarily a sign that there is something wrong with your brain.   Test Findings  Test scores are summarized in additional documentation associated with this encounter. Test scores are relative to age, gender, and educational history as available and appropriate. There were no concerns about performance validity as all findings fell within normal expectations.   General Intellectual  Functioning/Achievement:  Performance on single word reading was in the high average range with commensurate overall performance on the RIST index. She performed at a high average level on the more verbally oriented task and had slightly stronger yet still high average performance on the more visually oriented portion. High average presents as a reasonable standard of comparison for cognitive test performance.   Attention and Processing Efficiency: Performance on indicators of attention and working memory was good, with average digit repetition forward and high average digit repetition backward. Performance on timed measures of processing speed was average on timed number-symbol coding and on simple numeric sequencing.   Language: With respect to language, visual object confrontation naming was errorless. Normal average range scores were demonstrated on sensitive fluency indicators in response to both category and phonemic prompts.   Visuospatial Function: Performance on visuospatial and constructional indicators fell at an overall average level. Good average range performance was demonstrated on copy of a modestly complex figure. Judgment of angular line orientations was average to high average.   Learning and Memory: Performance on learning and memory measures was below expectations for Ms. Allende, with overall unusually low performance, which is significantly discrepant from her RIST index performance. The number of low subtest scores is also abnormal (I.e., would be expected in < 10% of healthy individuals matched for ability level). The profile shows scattered low scores due to susceptibility to retroactive interference, poor encoding in some cases, and retention across time was generally good, mitigating concerns about a storage problem. There may also be a contribution from premorbid strengths and weaknesses given that her low  scores were exclusively on verbally mediated measures.    In the  visual realm, Ms. Wirick demonstrated average immediate recall for a 12-item word list across three learning trials. She then forgot many of those words after being read a distractor alternate word list, resulting in an extremely low score. She retained the words that she had learned with some hypernesia on long-delayed recall, but her score was still extremely low due to the amount of information that was durably encoded. Delayed recognition for this information was better with average discriminability. Immediate recall for a short story was extremely low with good retention yet unusually to extremely low performance on delayed recall, because of her initial encoding. Memory for brief daily living type information was average on immediate recall and unusually low on delayed recall, this time with worse retention, but her recognition of the information contained amongst false choices was low average and reasonable.   In the visual realm, immediate and delayed recall for a series of designs that are difficult to verbally encode was average. Choosing target shapes under a cued recognition paradigm was average (nearly high average). Delayed free recall for a modestly complex figure was average.   Executive Functions: Performance on executive indicators was generally good, although as above, her memory test performance suggests an executive control problem. Modified [employer] generated an average executive function composite score with comparable categories correct and perseverative errors scores. Generation of words in response to the letters F-A-S was average. Alternating sequencing of numbers and letters of the alphabet was low average, which is a bit weak but not clearly impaired. She performed in the unusually low range on a measure involving reasoning with verbal information assessed via her ability to answer a series of yes/no questions. Clock drawing was within normal limits.   Rating  Scale(s): On self-rating scales, Ms. Hollett reported severe levels of depressive and anxiety symptomatology. On detailed assessment of emotional and psychological functioning, there is suggestion that she adequately attended to item content and responded in a forthright and accurate manner. She reported marked affective distress characterized by depressive symptomatology, somatic symptoms/preoccupation, and there are also indications of a likely past traumatic event. She reported manifesting most affective and physiological signs of depression (e.g., sadness, a loss of interest, a lost sense of pleasure), yet she does not appear to feel hopeless and her self-esteem is intact. She also reported experiencing a discomforting level of anxiety and is likely to be plagued by worry to a degree that her ability to concentrate and attend are significantly compromised. Apart from her affective concerns, she demonstrated an unusual level of concern about physical functioning and health matters and is likely to feel as though her health is not as good as that of her age matched peers. With regard to trauma, she has described a disturbing traumatic event that continues to distress her and produce recurrent episodes of anxiety. She appears to be motivated for treatment and reported a positive attitude toward the possibility of personal change, the value of therapy, and the importance of personal responsibility. These and other aspects of her personality are prognostically positive for psychotherapeutic treatment.   Viviano Simas Nicole Kindred PsyD, [place] Clinical Neuropsychologist

## 2020-09-29 ENCOUNTER — Encounter: Payer: Self-pay | Admitting: Counselor

## 2020-09-29 ENCOUNTER — Other Ambulatory Visit: Payer: Self-pay

## 2020-09-29 ENCOUNTER — Ambulatory Visit (INDEPENDENT_AMBULATORY_CARE_PROVIDER_SITE_OTHER): Payer: 59 | Admitting: Counselor

## 2020-09-29 DIAGNOSIS — F332 Major depressive disorder, recurrent severe without psychotic features: Secondary | ICD-10-CM | POA: Diagnosis not present

## 2020-09-29 DIAGNOSIS — R4189 Other symptoms and signs involving cognitive functions and awareness: Secondary | ICD-10-CM

## 2020-09-29 NOTE — Progress Notes (Signed)
[facility]  Feedback Note: I met with [name] Jensen to review the findings resulting from her neuropsychological evaluation. 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 She was upset but is taking it in stride, and she is connected in with a peer group who knew this individual and is able to talk about it with them. After processing that with her, time was spent reviewing the impressions and recommendations that are detailed in the evaluation report. We discussed impression of executive control problems, likely related to multiple factors including depression/anxiety, insufficient sleep, medication side effects, and overfocus on cognition. I am not concerned that this represents prodromal neurodegeneration and think instead that her issues should improve as these problems are corrected. Other topics of discussion are as reflected in the patient instructions. I took time to explain the findings and answer all the patient's questions. I encouraged [name] Jensen to contact me should she have any further questions or if further follow up is desired.   Current Medications and Medical History   Current Outpatient Medications  Medication Sig Dispense Refill  . atorvastatin (LIPITOR) 10 MG tablet Take 1 tablet (10 mg total) by mouth daily. 90 tablet 1  . Cholecalciferol (VITAMIN D3) 5000 units CAPS Take 5,000 Units by mouth daily.     . ciprofloxacin (CIPRO) 500 MG tablet Take 1 tablet (500 mg total) by mouth 2 (two) times daily. 20 tablet 0  . colesevelam (WELCHOL) 625 MG tablet TAKE 3 TABLETS BY MOUTH TWICE DAILY WITH MEALS 180 tablet 0  . dicyclomine (BENTYL) 10 MG capsule Take 1 capsule (10 mg total) by mouth 3 (three) times daily before meals. 270 capsule 3  . famotidine (PEPCID) 40 MG tablet Take 1 tablet (40 mg total) by mouth 2 (two) times daily. 60 tablet 5  . LORazepam (ATIVAN) 1  MG tablet TAKE 1/2 TO 1 TABLET BY MOUTH EVERY 8 HOURS AS NEEDED FOR ANXIETY 70 tablet 2  . meloxicam (MOBIC) 7.5 MG tablet Take 1-2 tablets (7.5-15 mg total) by mouth daily as needed for pain. 45 tablet 2  . metFORMIN (GLUCOPHAGE XR) 500 MG 24 hr tablet Take 1 tablet (500 mg total) by mouth in the morning and at bedtime. 60 tablet 3  . metoprolol succinate (TOPROL-XL) 100 MG 24 hr tablet TAKE 1 TABLET BY MOUTH TWICE A DAY.TAKE WITH OR IMMEDIATELY FOLLOWING A MEAL. 180 tablet 2  . metroNIDAZOLE (FLAGYL) 500 MG tablet Take 1 tablet (500 mg total) by mouth 3 (three) times daily. 21 tablet 0  . Omega-3 Fatty Acids (FISH OIL PO) Take 2,000 mg by mouth 2 (two) times daily.    . pantoprazole (PROTONIX) 40 MG tablet TAKE 1 TABLET (40 MG TOTAL) BY MOUTH TWICE A DAY BEFORE A MEAL. 180 tablet 2  . sertraline (ZOLOFT) 25 MG tablet Take 1 tablet (25 mg total) by mouth daily. 30 tablet 3  . temazepam (RESTORIL) 30 MG capsule TAKE 1 CAPSULE BY MOUTH ONCE DAILY AT BEDTIME 30 capsule 5  . tiZANidine (ZANAFLEX) 2 MG tablet Take 0.5-2 tablets (1-4 mg total) by mouth every 6 (six) hours as needed for muscle spasms. 40 tablet 1  . traZODone (DESYREL) 150 MG tablet TAKE 1 TABLET BY MOUTH AT BEDTIME. 90 tablet 1   No current facility-administered medications for this visit.    Patient Active Problem List   Diagnosis Date Noted  . Arthritis 09/15/2020  . Neck  pain 04/26/2020  . Palpitation 01/26/2020  . Skin lesion of scalp 03/18/2019  . IBS (irritable bowel syndrome) 03/18/2019  . Diverticulitis 12/23/2018  . Insomnia 08/21/2017  . Hair loss 03/28/2017  . Acute pain of right knee 03/28/2017  . Vitamin D deficiency 08/22/2015  . Cough 08/22/2015  . Left-sided thoracic back pain 04/14/2015  . Hyperlipidemia, mixed 04/11/2015  . Hyperglycemia 04/11/2015  . Abdominal pain 10/07/2014  . Family history of colon cancer in mother per 2006 GI note Dr. Watt Climes 10/04/2014  . Diarrhea 10/04/2014  . Preventative health  care 08/19/2014  . Benign paroxysmal positional vertigo 08/19/2014  . Obesity 08/19/2014  . Multinodular goiter (nontoxic) 06/29/2014  . Cystocele 06/12/2014  . Rectocele 06/12/2014  . SCC (squamous cell carcinoma) 06/12/2014  . Lactose intolerance 06/12/2014  . OSA (obstructive sleep apnea) 06/12/2014  . Tachycardia 06/12/2014  . Depression with anxiety 06/12/2014  . Headache 06/12/2014  . GERD (gastroesophageal reflux disease) 06/12/2014  . Euthyroid Hashimoto's thyroiditis   . Chicken pox   . Mumps   . Anemia   . Asthma   . 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 RESTLESS LEG SYNDROME 02/04/2009    Mental Status and Behavioral Observations  [name] Jensen presented on time to the present encounter and was alert and generally oriented. Speech was normal in rate, rhythm, volume, and prosody. Self-reported mood was "not good" and affect was appropriately variable, with moments of tearfulness when discussing the bad news she recently go. Thought process was logical and goal oriented and thought content was appropriate to the context of the appointment. [name] Jensen denied any thoughts of harming herself or others during this encounter.   Plan  Feedback provided regarding the patient's neuropsychological evaluation. She has plans to follow up with counseling. I offered a referral for psychiatry, but Dr. Charlett Blake is managing her psychiatric care and looking into some options for her. She presented as relieved that her test data are not concerning for an incipient dementia syndrome. [name] Jensen was encouraged to contact me if any questions arise or if further follow up is desired.   [name] Simas Nicole Kindred, PsyD, ABN Clinical Neuropsychologist  Service(s) Provided at This Encounter: 55 minutes [phone]; Psychotherapy with patient/family)

## 2020-09-29 NOTE — Patient Instructions (Signed)
Your performance and presentation on assessment were consistent with diminished memory efficiency. The profile is not concerning for the type of memory problem that we expect to see in neurodegenerative causes of memory loss, such as Alzheimer's disease, but rather suggests that your difficulties with memory are related to diminished cognitive efficiency (I.e., executive control problems).   Executive control is a higher order cognitive ability involved in regulating other cognitive resources. Much like the conductor of an orchestra coordinates multiple instruments to make music, executive capacities coordinate other lower-order skills (e.g., movement, language, attention) to form complex human behaviors. Individuals with executive control problems are often capable of doing most of the things they did before they were having problems, but they may not do so as effortlessly, efficiently, and consistently. These difficulties often manifest as problems tracking information, multitasking, and paying attention. Executive control problems often result in cognitive inefficiency and can present as "memory problems," because they decrease encoding and spontaneous retrieval of information.   In your case, I think the main contributor to your executive control difficulties is your depression and anxiety. You reported a severe level of depression and anxiety symptoms and those have been worse over the period you have noticed increased cognitive difficulties, related to stress at work among other factors. I think that treating these issues should be a primary focus. Your sleep pattern is also disturbed, which is likely contributing. 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   Depression can affect cognitive functioning in several ways. For one, there are neurobiological changes in depression  that can contribute to attention, concentration, and memory problems. These changes are so common they are part of the criteria we use to diagnose depressive disorders. Depression can also negatively impact your own appraisal of your cognitive abilities, leading you to feel like you are performing more poorly than is objectively warranted. In your case, I think that you would benefit from psychiatric consultation, which we can discuss. You stated that you have tried many things in the past although I think there are numerous other options. I would defer to your psychiatric treatment providers as to whether alternative treatments you were interested in, such as ECT and ketamine, would be helpful. There is a high likelihood that your difficulties with depression and anxiety are caused or exacerbated by your previous experience of trauma, and for that I think psychotherapy is also likely to be beneficial.   There are few things as disruptive to brain functioning as not getting a good night's sleep. For sleep, I recommend against using medications, which can have lingering sedating effects on the brain and rob your brain of restful REM sleep. Instead, consider trying some of the following sleep hygiene recommendations. They may not work at once and may take effort, but the effort you spend is likely to be rewarded with better sleep eventually:   Stick to a sleep schedule of the same bedtime and wake time even on the weekends, which can help to regulate your body's internal clock so that you fall asleep and stay asleep.   Practice a relaxing bedtime ritual (conducted away from bright lights) which will help separate your sleep from stimulating activities and prepare your body to fall asleep when you go to bed.   Avoid naps, especially in the afternoon.   Evaluate your room and create conditions that will promote sleep such as keeping it  cool (between 60 - 67 degrees), quiet, and free from any lights.  Consider using blackout curtains, a "white noise" generator, or fan that will help mask any noises that might prevent you from going to sleep or awaken you during the night.   Sleep on a comfortable mattress and pillows.   Avoid bright light in the evening and excessive use of portable electronic devices right before bed that may contain light frequencies that can contribute to sleep problems.   Avoid alcohol, cigarettes, or heavy meals in the evening. If you must eat, consume a light snack 45 minutes before bed.   Use your bed only for sleep to strengthen the association between your bed and sleep.   If you can't go to sleep within 30 minutes, go into another room and do something relaxing until you feel tired. Then, come back and try to go to sleep again for 30 minutes and repeat until sleep is achieved.   Some people find over the counter melatonin to be helpful for sleep, which you could discuss with a pharmacist or prescribing provider.   I would like to reassure you that there are no signs you are developing a dementia. You did well in most areas and your memory problem, clinical history, and preserved functioning all argue strongly against an impression of a dementia level problem or even an incipient dementia condition.   Avoid overfocusing on cognitive performance. Memory and cognition are notoriously fallible and if you are looking for cognitive problems, you are bound to find them. Once someone gets worried about their memory and thinking, they may overfocus on how they are doing day-to-day, and then when normal day-to-day cognitive errors are made, this becomes a cause for more concern. This concern and anxiety then decreases focus from the task at hand, reducing concentration, causing more cognitive problems, and creating a vicious cycle. Rather than critiquing your performance, I would encourage you to remain present minded and focus on the task at hand. Perhaps most importantly, have  reasonable expectations for yourself.  Healthy people forget things, lose focus, and do not perform 100% correctly all the time. Some cognitive errors are normal and are not necessarily a sign that there is something wrong with your brain.

## 2020-10-04 MED FILL — traZODone HCL 150 MG TABS: 150 | 90 days supply | Qty: 90 | Fill #1

## 2020-10-06 ENCOUNTER — Other Ambulatory Visit (HOSPITAL_COMMUNITY): Payer: Self-pay | Admitting: Physician Assistant

## 2020-10-06 DIAGNOSIS — L820 Inflamed seborrheic keratosis: Secondary | ICD-10-CM | POA: Diagnosis not present

## 2020-10-06 DIAGNOSIS — L218 Other seborrheic dermatitis: Secondary | ICD-10-CM | POA: Diagnosis not present

## 2020-10-06 DIAGNOSIS — L700 Acne vulgaris: Secondary | ICD-10-CM | POA: Diagnosis not present

## 2020-10-06 DIAGNOSIS — L821 Other seborrheic keratosis: Secondary | ICD-10-CM | POA: Diagnosis not present

## 2020-10-06 DIAGNOSIS — D485 Neoplasm of uncertain behavior of skin: Secondary | ICD-10-CM | POA: Diagnosis not present

## 2020-10-06 DIAGNOSIS — D1801 Hemangioma of skin and subcutaneous tissue: Secondary | ICD-10-CM | POA: Diagnosis not present

## 2020-10-06 DIAGNOSIS — D225 Melanocytic nevi of trunk: Secondary | ICD-10-CM | POA: Diagnosis not present

## 2020-10-06 DIAGNOSIS — L905 Scar conditions and fibrosis of skin: Secondary | ICD-10-CM | POA: Diagnosis not present

## 2020-10-06 DIAGNOSIS — L648 Other androgenic alopecia: Secondary | ICD-10-CM | POA: Diagnosis not present

## 2020-10-06 DIAGNOSIS — L738 Other specified follicular disorders: Secondary | ICD-10-CM | POA: Diagnosis not present

## 2020-10-06 MED FILL — TRIAMCINOLONE 0.1% CREAM: 0.1 | 30 days supply | Qty: 80 | Fill #0

## 2020-10-06 MED FILL — KETOCONAZOLE 2 % CREA: 2 | 30 days supply | Qty: 60 | Fill #0

## 2020-10-14 ENCOUNTER — Encounter: Payer: Self-pay | Admitting: Family Medicine

## 2020-10-14 ENCOUNTER — Other Ambulatory Visit: Payer: Self-pay

## 2020-10-14 ENCOUNTER — Other Ambulatory Visit: Payer: Self-pay | Admitting: Family Medicine

## 2020-10-14 ENCOUNTER — Telehealth (INDEPENDENT_AMBULATORY_CARE_PROVIDER_SITE_OTHER): Payer: 59 | Admitting: Family Medicine

## 2020-10-14 VITALS — BP 128/82 | HR 76 | Wt 182.0 lb

## 2020-10-14 DIAGNOSIS — R002 Palpitations: Secondary | ICD-10-CM

## 2020-10-14 DIAGNOSIS — E739 Lactose intolerance, unspecified: Secondary | ICD-10-CM

## 2020-10-14 DIAGNOSIS — R197 Diarrhea, unspecified: Secondary | ICD-10-CM | POA: Diagnosis not present

## 2020-10-14 DIAGNOSIS — F418 Other specified anxiety disorders: Secondary | ICD-10-CM

## 2020-10-14 DIAGNOSIS — R739 Hyperglycemia, unspecified: Secondary | ICD-10-CM

## 2020-10-14 MED ORDER — HYOSCYAMINE SULFATE 0.125 MG SL SUBL: 0.1250 mg | SUBLINGUAL_TABLET | SUBLINGUAL | 2 refills | Status: DC | PRN

## 2020-10-14 MED ORDER — SERTRALINE HCL 50 MG PO TABS: 50.0000 mg | ORAL_TABLET | Freq: Every day | ORAL | 3 refills | Status: DC

## 2020-10-14 MED FILL — OSCIMIN 0.125 MG SUBL: 0.125 | 7 days supply | Qty: 40 | Fill #0

## 2020-10-14 MED FILL — SERTRALINE HCL 50 MG TABLET: 50 | 30 days supply | Qty: 30 | Fill #0

## 2020-10-14 NOTE — Assessment & Plan Note (Signed)
No trouble with addition of Sertraline but no obvious improvement either. We will try increasing to 50 mg daily and reassess.

## 2020-10-14 NOTE — Assessment & Plan Note (Signed)
No c/o recent flares

## 2020-10-14 NOTE — Assessment & Plan Note (Signed)
Tries to avoid dairy.

## 2020-10-14 NOTE — Assessment & Plan Note (Signed)
Welchol 3 tabs daily has helped to control her diarrhea, is also encouraged to do Kegel exercises 10 bid to help strengthen the pelvic floor

## 2020-10-14 NOTE — Assessment & Plan Note (Signed)
hgba1c acceptable, minimize simple carbs. Increase exercise as tolerated.  

## 2020-10-14 NOTE — Progress Notes (Signed)
Virtual Visit via Phone Note  I connected with [name] Jensen on 10/14/20 at  3:20 PM EST by a phone enabled telemedicine application and verified that I am speaking with the correct person using two identifiers.  Location: Patient: home, patient and provider in visit Provider: office   I discussed the limitations of evaluation and management by telemedicine and the availability of in person appointments. The patient expressed understanding and agreed to proceed. S Chism, CMA was able to get the patient set up on a phone visit after patient was unable to get on a video visit    Subjective:    Patient ID: [name] Jensen, female    DOB: 07-11-1956, 64 y.o.   MRN: [id number]  Chief Complaint  Patient presents with  . Follow-up    HPI Patient is in today for follow up on chronic medical concerns. No recent febrile illness or hospitalizations. The addition of Sertraline 25 mg daily has not been helpful but no concerning side effects. She continues to have anhedonia but denies suicidal ideation. Work stress has improved some. welchol has helped her diarrhea. No bloody or tarry stool. Denies CP/palp/SOB/HA/congestion/fevers or GU c/o. Taking meds as prescribed  Past Medical History:  Diagnosis Date  . Adjustment reaction with anxiety and depression 06/12/2014  . Anemia    h/o low ferritin  . Anxiety   . Asthma    environmental triggers  . Chicken pox as a child  . Cough 08/22/2015  . Depression   . Depression with anxiety 06/12/2014  . Diverticulosis   . Ehrlichiosis 7371  . Gallstones   . GERD (gastroesophageal reflux disease)   . Hair loss 03/28/2017  . Hiatal hernia 2007  . History of viral illness   . Hyperlipidemia, mixed 04/11/2015  . Insomnia 08/21/2017  . Lactose intolerance 06/12/2014  . Mumps as a child  . Overweight 08/19/2014  . Rectocele 06/12/2014  . RLS (restless legs syndrome)   . 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 Sessile  colonic polyp   . SVT (supraventricular tachycardia) ([place])   . Tachycardia 06/12/2014  . Thyroid disease   . Vitamin D deficiency 08/22/2015    Past Surgical History:  Procedure Laterality Date  . ABDOMINAL HYSTERECTOMY  11-2008   total with cystocele, rectocele repair  . APPENDECTOMY    . CHOLECYSTECTOMY    . COLONOSCOPY  2006   diverticulosis, hemorrhoids ([place])  . ESOPHAGOGASTRODUODENOSCOPY  2006   small hiatus hernia ([place])  . ESOPHAGOGASTRODUODENOSCOPY N/A 02/21/2018   Procedure: ESOPHAGOGASTRODUODENOSCOPY (EGD);  Surgeon: Rogene Houston, MD;  Location: [facility];  Service: Endoscopy;  Laterality: N/A;  pt knows to arrive at 2:45  . INCONTINENCE SURGERY    . SKIN SURGERY     right arm scc  . svt ablation    . TONSILLECTOMY  1972   2ith adenoid  . WISDOM TOOTH EXTRACTION  64 yrs old    Family History  Problem Relation Age of Onset  . Hypertension Mother   . Hypertension Father   . Hypertension Sister   . Diabetes Sister        type 2  . Fibromyalgia Sister   . GI Bleed Maternal Grandmother   . Atrial fibrillation Maternal Grandmother   . Heart disease Maternal Grandfather   . Diabetes Paternal Grandmother   . Stroke Paternal Grandfather   . Proteinuria Sister   . Colon cancer Neg Hx   . Esophageal cancer Neg Hx   .  Rectal cancer Neg Hx   . Stomach cancer Neg Hx     Social History   Socioeconomic History  . Marital status: Divorced    Spouse name: Not on file  . Number of children: 2  . Years of education: Not on file  . Highest education level: Not on file  Occupational History  . Occupation: Programmer, multimedia: [employer]  Tobacco Use  . Smoking status: Never Smoker  . Smokeless tobacco: Never Used  Vaping Use  . Vaping Use: Never used  Substance and Sexual Activity  . Alcohol use: No    Alcohol/week: 0.0 standard drinks  . Drug use: No  . Sexual activity: Not on file    Comment: lives by self with 4 dogs works at [employer], avoids dairy    Other Topics Concern  . Not on file  Social History Narrative  . Not on file   Social Determinants of Health   Financial Resource Strain:   . Difficulty of Paying Living Expenses: Not on file  Food Insecurity:   . Worried About Charity fundraiser in the Last Year: Not on file  . Ran Out of Food in the Last Year: Not on file  Transportation Needs:   . Lack of Transportation (Medical): Not on file  . Lack of Transportation (Non-Medical): Not on file  Physical Activity:   . Days of Exercise per Week: Not on file  . Minutes of Exercise per Session: Not on file  Stress:   . Feeling of Stress : Not on file  Social Connections:   . Frequency of Communication with Friends and Family: Not on file  . Frequency of Social Gatherings with Friends and Family: Not on file  . Attends Religious Services: Not on file  . Active Member of Clubs or Organizations: Not on file  . Attends Archivist Meetings: Not on file  . Marital Status: Not on file  Intimate Partner Violence:   . Fear of Current or Ex-Partner: Not on file  . Emotionally Abused: Not on file  . Physically Abused: Not on file  . Sexually Abused: Not on file    Outpatient Medications Prior to Visit  Medication Sig Dispense Refill  . atorvastatin (LIPITOR) 10 MG tablet Take 1 tablet (10 mg total) by mouth daily. 90 tablet 1  . Cholecalciferol (VITAMIN D3) 5000 units CAPS Take 5,000 Units by mouth daily.     . colesevelam (WELCHOL) 625 MG tablet TAKE 3 TABLETS BY MOUTH TWICE DAILY WITH MEALS 180 tablet 0  . dicyclomine (BENTYL) 10 MG capsule Take 1 capsule (10 mg total) by mouth 3 (three) times daily before meals. 270 capsule 3  . famotidine (PEPCID) 40 MG tablet Take 1 tablet (40 mg total) by mouth 2 (two) times daily. 60 tablet 5  . LORazepam (ATIVAN) 1 MG tablet TAKE 1/2 TO 1 TABLET BY MOUTH EVERY 8 HOURS AS NEEDED FOR ANXIETY 70 tablet 2  . meloxicam (MOBIC) 7.5 MG tablet Take 1-2 tablets (7.5-15 mg total) by mouth  daily as needed for pain. 45 tablet 2  . metFORMIN (GLUCOPHAGE XR) 500 MG 24 hr tablet Take 1 tablet (500 mg total) by mouth in the morning and at bedtime. 60 tablet 3  . metoprolol succinate (TOPROL-XL) 100 MG 24 hr tablet TAKE 1 TABLET BY MOUTH TWICE A DAY.TAKE WITH OR IMMEDIATELY FOLLOWING A MEAL. 180 tablet 2  . pantoprazole (PROTONIX) 40 MG tablet TAKE 1 TABLET (40 MG TOTAL)  BY MOUTH TWICE A DAY BEFORE A MEAL. 180 tablet 2  . temazepam (RESTORIL) 30 MG capsule TAKE 1 CAPSULE BY MOUTH ONCE DAILY AT BEDTIME 30 capsule 5  . tiZANidine (ZANAFLEX) 2 MG tablet Take 0.5-2 tablets (1-4 mg total) by mouth every 6 (six) hours as needed for muscle spasms. 40 tablet 1  . traZODone (DESYREL) 150 MG tablet TAKE 1 TABLET BY MOUTH AT BEDTIME. 90 tablet 1  . ciprofloxacin (CIPRO) 500 MG tablet Take 1 tablet (500 mg total) by mouth 2 (two) times daily. 20 tablet 0  . metroNIDAZOLE (FLAGYL) 500 MG tablet Take 1 tablet (500 mg total) by mouth 3 (three) times daily. 21 tablet 0  . sertraline (ZOLOFT) 25 MG tablet Take 1 tablet (25 mg total) by mouth daily. 30 tablet 3  . Omega-3 Fatty Acids (FISH OIL PO) Take 2,000 mg by mouth 2 (two) times daily.     No facility-administered medications prior to visit.    Allergies  Allergen Reactions  . Dilaudid [Hydromorphone Hcl] Other (See Comments)    Respiratory , cardiac     Review of Systems  Constitutional: Negative for fever and malaise/fatigue.  HENT: Negative for congestion.   Eyes: Negative for blurred vision.  Respiratory: Negative for shortness of breath.   Cardiovascular: Negative for chest pain, palpitations and leg swelling.  Gastrointestinal: Positive for abdominal pain and diarrhea. Negative for blood in stool and nausea.  Genitourinary: Negative for dysuria and frequency.  Musculoskeletal: Negative for falls.  Skin: Negative for rash.  Neurological: Negative for dizziness, loss of consciousness and headaches.  Endo/Heme/Allergies: Negative for  environmental allergies.  Psychiatric/Behavioral: Positive for depression. The patient is nervous/anxious.        Objective:    Physical Exam unable to obtain via phone visit  BP 128/82   Pulse 76   Wt 182 lb (82.6 kg)   LMP  (LMP Unknown)   SpO2 97%   BMI 31.24 kg/m  Wt Readings from Last 3 Encounters:  10/14/20 182 lb (82.6 kg)  09/14/20 190 lb 3.2 oz (86.3 kg)  04/26/20 184 lb 6 oz (83.6 kg)    Diabetic Foot Exam - Simple   No data filed     Lab Results  Component Value Date   WBC 6.7 09/14/2020   HGB 13.5 09/14/2020   HCT 39.5 09/14/2020   PLT 285 09/14/2020   GLUCOSE 110 (H) 09/14/2020   CHOL 257 (H) 09/14/2020   TRIG 232 (H) 09/14/2020   HDL 58 09/14/2020   LDLDIRECT 72.0 04/26/2020   LDLCALC 160 (H) 09/14/2020   ALT 21 09/14/2020   AST 17 09/14/2020   NA 138 09/14/2020   K 4.4 09/14/2020   CL 105 09/14/2020   CREATININE 0.63 09/14/2020   BUN 11 09/14/2020   CO2 22 09/14/2020   TSH 1.41 09/14/2020   INR 1.0 11/09/2008   HGBA1C 6.0 (H) 09/14/2020    Lab Results  Component Value Date   TSH 1.41 09/14/2020   Lab Results  Component Value Date   WBC 6.7 09/14/2020   HGB 13.5 09/14/2020   HCT 39.5 09/14/2020   MCV 83.3 09/14/2020   PLT 285 09/14/2020   Lab Results  Component Value Date   NA 138 09/14/2020   K 4.4 09/14/2020   CO2 22 09/14/2020   GLUCOSE 110 (H) 09/14/2020   BUN 11 09/14/2020   CREATININE 0.63 09/14/2020   BILITOT 0.5 09/14/2020   ALKPHOS 70 04/26/2020   AST 17 09/14/2020  ALT 21 09/14/2020   PROT 7.3 09/14/2020   ALBUMIN 4.5 04/26/2020   CALCIUM 9.2 09/14/2020   ANIONGAP 10 01/29/2019   GFR 73.80 04/26/2020   Lab Results  Component Value Date   CHOL 257 (H) 09/14/2020   Lab Results  Component Value Date   HDL 58 09/14/2020   Lab Results  Component Value Date   LDLCALC 160 (H) 09/14/2020   Lab Results  Component Value Date   TRIG 232 (H) 09/14/2020   Lab Results  Component Value Date   CHOLHDL 4.4  09/14/2020   Lab Results  Component Value Date   HGBA1C 6.0 (H) 09/14/2020       Assessment & Plan:   Problem List Items Addressed This Visit    Lactose intolerance    Tries to avoid dairy.      Depression with anxiety - Primary    No trouble with addition of Sertraline but no obvious improvement either. We will try increasing to 50 mg daily and reassess.       Relevant Medications   sertraline (ZOLOFT) 50 MG tablet   Other Relevant Orders   Ambulatory referral to Psychiatry   Diarrhea    Welchol 3 tabs daily has helped to control her diarrhea, is also encouraged to do Kegel exercises 10 bid to help strengthen the pelvic floor      Hyperglycemia    hgba1c acceptable, minimize simple carbs. Increase exercise as tolerated.       Palpitation    No c/o recent flares         I have discontinued Barnetta [facility] A[facility] "Cathey"'s ciprofloxacin, metroNIDAZOLE, and sertraline. I am also having her start on hyoscyamine and sertraline. Additionally, I am having her maintain her Vitamin D3, Omega-3 Fatty Acids (FISH OIL PO), dicyclomine, famotidine, pantoprazole, tiZANidine, metFORMIN, metoprolol succinate, traZODone, colesevelam, LORazepam, temazepam, meloxicam, and atorvastatin.  Meds ordered this encounter  Medications  . hyoscyamine (LEVSIN SL) 0.125 MG SL tablet    Sig: Place 1 tablet (0.125 mg total) under the tongue every 4 (four) hours as needed.    Dispense:  40 tablet    Refill:  2  . sertraline (ZOLOFT) 50 MG tablet    Sig: Take 1 tablet (50 mg total) by mouth daily.    Dispense:  30 tablet    Refill:  3    I discussed the assessment and treatment plan with the patient. The patient was provided an opportunity to ask questions and all were answered. The patient agreed with the plan and demonstrated an understanding of the instructions.   The patient was advised to call back or seek an in-person evaluation if the symptoms worsen or if the condition fails to improve as  anticipated.  I provided 25 minutes of non-face-to-face time during this encounter.   Penni Homans, MD

## 2020-10-18 ENCOUNTER — Ambulatory Visit (HOSPITAL_COMMUNITY)
Admission: RE | Admit: 2020-10-18 | Discharge: 2020-10-18 | Disposition: A | Discharge status: Home / Self Care | Payer: 59 | Source: Ambulatory Visit | Attending: Family Medicine | Admitting: Family Medicine

## 2020-10-18 ENCOUNTER — Other Ambulatory Visit (HOSPITAL_BASED_OUTPATIENT_CLINIC_OR_DEPARTMENT_OTHER): Payer: 59

## 2020-10-18 ENCOUNTER — Encounter (HOSPITAL_COMMUNITY): Payer: Self-pay

## 2020-10-18 ENCOUNTER — Other Ambulatory Visit: Payer: Self-pay

## 2020-10-18 DIAGNOSIS — E2839 Other primary ovarian failure: Secondary | ICD-10-CM | POA: Diagnosis not present

## 2020-10-18 DIAGNOSIS — R29890 Loss of height: Secondary | ICD-10-CM | POA: Diagnosis not present

## 2020-10-18 DIAGNOSIS — Z780 Asymptomatic menopausal state: Secondary | ICD-10-CM

## 2020-10-18 DIAGNOSIS — Z13820 Encounter for screening for osteoporosis: Secondary | ICD-10-CM | POA: Diagnosis not present

## 2020-10-18 DIAGNOSIS — Z1231 Encounter for screening mammogram for malignant neoplasm of breast: Secondary | ICD-10-CM | POA: Insufficient documentation

## 2020-10-18 DIAGNOSIS — Z8739 Personal history of other diseases of the musculoskeletal system and connective tissue: Secondary | ICD-10-CM | POA: Diagnosis not present

## 2020-11-02 MED FILL — TEMAZEPAM 30 MG CAPSULE: 30 | 30 days supply | Qty: 30 | Fill #3

## 2020-11-02 MED FILL — LORazepam 1 MG TABS: 1 | 23 days supply | Qty: 70 | Fill #2

## 2020-11-02 MED FILL — METOPROLOL SUCCINATE ER 100: 100 | 90 days supply | Qty: 180 | Fill #1

## 2020-11-22 ENCOUNTER — Ambulatory Visit (HOSPITAL_COMMUNITY)
Admission: RE | Admit: 2020-11-22 | Discharge: 2020-11-22 | Disposition: A | Discharge status: Home / Self Care | Payer: 59 | Source: Ambulatory Visit | Attending: Family Medicine | Admitting: Family Medicine

## 2020-11-22 ENCOUNTER — Other Ambulatory Visit: Payer: Self-pay

## 2020-11-22 DIAGNOSIS — Z1231 Encounter for screening mammogram for malignant neoplasm of breast: Secondary | ICD-10-CM | POA: Insufficient documentation

## 2020-12-07 ENCOUNTER — Other Ambulatory Visit: Payer: Self-pay | Admitting: Family Medicine

## 2020-12-07 ENCOUNTER — Other Ambulatory Visit: Payer: Self-pay

## 2020-12-07 ENCOUNTER — Other Ambulatory Visit: Payer: Self-pay | Admitting: Physician Assistant

## 2020-12-07 MED ORDER — FAMOTIDINE 40 MG PO TABS: 40.0000 mg | ORAL_TABLET | Freq: Two times a day (BID) | ORAL | 5 refills | Status: DC

## 2020-12-07 MED FILL — FAMOTIDINE 40 MG TABS: 40 | 90 days supply | Qty: 180 | Fill #0

## 2020-12-07 MED FILL — TEMAZEPAM 30 MG CAPSULE: 30 | 30 days supply | Qty: 30 | Fill #4

## 2020-12-07 NOTE — Telephone Encounter (Signed)
Requesting: LORazepam Contract:04/26/2020 UDS:04/26/2020 Last Visit:09/14/2020 Next Visit:01/17/2021 Last Refill:08/03/2020  Please Advise

## 2020-12-08 MED FILL — LORazepam 1 MG TABS: 1 | 23 days supply | Qty: 70 | Fill #0

## 2020-12-13 ENCOUNTER — Other Ambulatory Visit: Payer: Self-pay | Admitting: Family Medicine

## 2020-12-13 NOTE — Telephone Encounter (Signed)
Note from Brent- Ms. Mckain's package was lost by [employer]. Our shipping policy requires we obtain a new Rx from the prescriber to replace what was lost by the shipper. Can you send a one time Rx to replace? Thank you

## 2020-12-29 ENCOUNTER — Other Ambulatory Visit: Payer: Self-pay | Admitting: Physician Assistant

## 2020-12-29 MED FILL — COLESEVELAM HCL 625 MG TABS: 625 | 30 days supply | Qty: 180 | Fill #0

## 2021-01-04 MED FILL — TEMAZEPAM 30 MG CAPSULE: 30 | 30 days supply | Qty: 30 | Fill #5

## 2021-01-17 ENCOUNTER — Ambulatory Visit: Payer: 59 | Admitting: Family Medicine

## 2021-01-17 ENCOUNTER — Other Ambulatory Visit (HOSPITAL_COMMUNITY): Payer: Self-pay | Admitting: Family Medicine

## 2021-01-17 ENCOUNTER — Encounter: Payer: Self-pay | Admitting: Family Medicine

## 2021-01-17 ENCOUNTER — Other Ambulatory Visit: Payer: Self-pay

## 2021-01-17 VITALS — BP 140/80 | HR 95 | Temp 99.2°F | Resp 18 | Ht 64.0 in | Wt 200.8 lb

## 2021-01-17 DIAGNOSIS — E785 Hyperlipidemia, unspecified: Secondary | ICD-10-CM | POA: Diagnosis not present

## 2021-01-17 DIAGNOSIS — E1165 Type 2 diabetes mellitus with hyperglycemia: Secondary | ICD-10-CM

## 2021-01-17 DIAGNOSIS — I10 Essential (primary) hypertension: Secondary | ICD-10-CM | POA: Diagnosis not present

## 2021-01-17 DIAGNOSIS — E1169 Type 2 diabetes mellitus with other specified complication: Secondary | ICD-10-CM | POA: Diagnosis not present

## 2021-01-17 DIAGNOSIS — M791 Myalgia, unspecified site: Secondary | ICD-10-CM

## 2021-01-17 MED ORDER — RYBELSUS 3 MG PO TABS: ORAL_TABLET | ORAL | 0 refills | Status: DC

## 2021-01-17 MED ORDER — BLOOD GLUCOSE MONITOR KIT: PACK | 0 refills | Status: AC

## 2021-01-17 MED FILL — FREESTYLE LANCETS: 25 days supply | Qty: 100 | Fill #0

## 2021-01-17 MED FILL — RYBELSUS 3 MG TABS: 3 | 30 days supply | Qty: 30 | Fill #0

## 2021-01-17 MED FILL — FREESTYLE LITE TEST STRIP: 25 days supply | Qty: 100 | Fill #0

## 2021-01-17 MED FILL — FREESTYLE LITE METER: W/DEVICE | 30 days supply | Qty: 1 | Fill #0

## 2021-01-17 NOTE — Progress Notes (Signed)
Patient ID: [name] Jensen, female    DOB: October 09, 1956  Age: 65 y.o. MRN: [id number]    Subjective:  Subjective  HPI [name] Jensen presents for f/u dm and cholesterol.   Her bs has been running 130-140s and she is just not feelling good.  She had to stop the metformin due to IBS.  She does not have a glucometer at home .     Review of Systems  Constitutional: Negative for appetite change, diaphoresis, fatigue and unexpected weight change.  Eyes: Negative for pain, redness and visual disturbance.  Respiratory: Negative for cough, chest tightness, shortness of breath and wheezing.   Cardiovascular: Negative for chest pain, palpitations and leg swelling.  Endocrine: Negative for cold intolerance, heat intolerance, polydipsia, polyphagia and polyuria.  Genitourinary: Negative for difficulty urinating, dysuria and frequency.  Neurological: Negative for dizziness, light-headedness, numbness and headaches.    History Past Medical History:  Diagnosis Date  . Adjustment reaction with anxiety and depression 06/12/2014  . Anemia    h/o low ferritin  . Anxiety   . Asthma    environmental triggers  . Chicken pox as a child  . Cough 08/22/2015  . Depression   . Depression with anxiety 06/12/2014  . Diverticulosis   . Ehrlichiosis 9163  . Gallstones   . GERD (gastroesophageal reflux disease)   . Hair loss 03/28/2017  . Hiatal hernia 2007  . History of viral illness   . Hyperlipidemia, mixed 04/11/2015  . Insomnia 08/21/2017  . Lactose intolerance 06/12/2014  . Mumps as a child  . Overweight 08/19/2014  . Rectocele 06/12/2014  . RLS (restless legs syndrome)   . 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 Sessile colonic polyp   . SVT (supraventricular tachycardia) ([place])   . Tachycardia 06/12/2014  . Thyroid disease   . Vitamin D deficiency 08/22/2015    She has a past surgical history that includes Cholecystectomy; Appendectomy; Tonsillectomy (1972); Wisdom  tooth extraction (65 yrs old); Abdominal hysterectomy (11-2008); Skin surgery; Colonoscopy (2006); Esophagogastroduodenoscopy (2006); svt ablation; Incontinence surgery; and Esophagogastroduodenoscopy (N/A, 02/21/2018).   Her family history includes Atrial fibrillation in her maternal grandmother; Diabetes in her paternal grandmother and sister; Fibromyalgia in her sister; GI Bleed in her maternal grandmother; Heart disease in her maternal grandfather; Hypertension in her father, mother, and sister; Proteinuria in her sister; Stroke in her paternal grandfather.She reports that she has never smoked. She has never used smokeless tobacco. She reports that she does not drink alcohol and does not use drugs.  Current Outpatient Medications on File Prior to Visit  Medication Sig Dispense Refill  . LORazepam (ATIVAN) 1 MG tablet TAKE 1/2 TO 1 TABLET BY MOUTH EVERY 8 HOURS AS NEEDED FOR ANXIETY 70 tablet 2  . atorvastatin (LIPITOR) 10 MG tablet Take 1 tablet (10 mg total) by mouth daily. 90 tablet 1  . Cholecalciferol (VITAMIN D3) 5000 units CAPS Take 5,000 Units by mouth daily.     . colesevelam (WELCHOL) 625 MG tablet TAKE 3 TABLETS BY MOUTH TWICE DAILY WITH MEALS 180 tablet 0  . dicyclomine (BENTYL) 10 MG capsule Take 1 capsule (10 mg total) by mouth 3 (three) times daily before meals. 270 capsule 3  . famotidine (PEPCID) 40 MG tablet Take 1 tablet (40 mg total) by mouth 2 (two) times daily. 60 tablet 5  . metoprolol succinate (TOPROL-XL) 100 MG 24 hr tablet TAKE 1 TABLET BY MOUTH TWICE A DAY.TAKE WITH OR IMMEDIATELY FOLLOWING A  MEAL. 180 tablet 2  . pantoprazole (PROTONIX) 40 MG tablet TAKE 1 TABLET (40 MG TOTAL) BY MOUTH TWICE A DAY BEFORE A MEAL. 180 tablet 2  . sertraline (ZOLOFT) 50 MG tablet Take 1 tablet (50 mg total) by mouth daily. 30 tablet 3  . temazepam (RESTORIL) 30 MG capsule TAKE 1 CAPSULE BY MOUTH ONCE DAILY AT BEDTIME 30 capsule 5  . traZODone (DESYREL) 150 MG tablet TAKE 1 TABLET BY MOUTH  AT BEDTIME. 90 tablet 1   No current facility-administered medications on file prior to visit.     Objective:  Objective  Physical Exam Vitals and nursing note reviewed.  Constitutional:      Appearance: She is well-developed and well-nourished.  HENT:     Head: Normocephalic and atraumatic.  Eyes:     Extraocular Movements: EOM normal.     Conjunctiva/sclera: Conjunctivae normal.  Neck:     Thyroid: No thyromegaly.     Vascular: No carotid bruit or JVD.  Cardiovascular:     Rate and Rhythm: Normal rate and regular rhythm.     Heart sounds: Normal heart sounds. No murmur heard.   Pulmonary:     Effort: Pulmonary effort is normal. No respiratory distress.     Breath sounds: Normal breath sounds. No wheezing or rales.  Chest:     Chest wall: No tenderness.  Musculoskeletal:        General: No edema.     Cervical back: Normal range of motion and neck supple.  Neurological:     Mental Status: She is alert and oriented to person, place, and time.  Psychiatric:        Mood and Affect: Mood and affect normal.    BP 140/80 (BP Location: Right Arm, Patient Position: Sitting, Cuff Size: Normal)   Pulse 95   Temp 99.2 F (37.3 C) (Oral)   Resp 18   Ht _0  (1.626 m)   Wt 200 lb 12.8 oz (91.1 kg)   LMP  (LMP Unknown)   SpO2 96%   BMI 34.47 kg/m  Wt Readings from Last 3 Encounters:  01/17/21 200 lb 12.8 oz (91.1 kg)  10/14/20 182 lb (82.6 kg)  09/14/20 190 lb 3.2 oz (86.3 kg)     Lab Results  Component Value Date   WBC 6.7 09/14/2020   HGB 13.5 09/14/2020   HCT 39.5 09/14/2020   PLT 285 09/14/2020   GLUCOSE 110 (H) 09/14/2020   CHOL 257 (H) 09/14/2020   TRIG 232 (H) 09/14/2020   HDL 58 09/14/2020   LDLDIRECT 72.0 04/26/2020   LDLCALC 160 (H) 09/14/2020   ALT 21 09/14/2020   AST 17 09/14/2020   NA 138 09/14/2020   K 4.4 09/14/2020   CL 105 09/14/2020   CREATININE 0.63 09/14/2020   BUN 11 09/14/2020   CO2 22 09/14/2020   TSH 1.41 09/14/2020   INR 1.0  11/09/2008   HGBA1C 6.0 (H) 09/14/2020    MM 3D SCREEN BREAST BILATERAL  Result Date: 11/25/2020 CLINICAL DATA:  Screening. EXAM: DIGITAL SCREENING BILATERAL MAMMOGRAM WITH TOMO AND CAD COMPARISON:  Previous exam(s). ACR Breast Density Category b: There are scattered areas of fibroglandular density. FINDINGS: There are no findings suspicious for malignancy. Images were processed with CAD. IMPRESSION: No mammographic evidence of malignancy. A result letter of this screening mammogram will be mailed directly to the patient. RECOMMENDATION: Screening mammogram in one year. (Code:SM-B-01Y) BI-RADS CATEGORY  1: Negative. Electronically Signed   By: Dedra Skeens.D.  On: 11/25/2020 09:50     Assessment & Plan:  Plan  I have discontinued [name] Chapel A. Fitzhenry "Cathey"'s Omega-3 Fatty Acids (FISH OIL PO), tiZANidine, metFORMIN, meloxicam, and hyoscyamine. I am also having her start on blood glucose meter kit and supplies and Rybelsus. Additionally, I am having her maintain her Vitamin D3, dicyclomine, pantoprazole, metoprolol succinate, traZODone, temazepam, atorvastatin, sertraline, famotidine, LORazepam, and colesevelam.  Meds ordered this encounter  Medications  . blood glucose meter kit and supplies KIT    Sig: Dispense based on patient and insurance preference. Use up to four times daily as directed. (FOR ICD-9 250.00, 250.01).    Dispense:  1 each    Refill:  0    Order Specific Question:   Number of strips    Answer:   100    Order Specific Question:   Number of lancets    Answer:   100  . Semaglutide (RYBELSUS) 3 MG TABS    Sig: 1 po qd    Dispense:  30 tablet    Refill:  0    Problem List Items Addressed This Visit      Unprioritized   Uncontrolled type 2 diabetes mellitus with hyperglycemia ([place]) - Primary    Check labs' Start rybelsus 3 mg daily for 1 month  rx glucometer       Relevant Medications   blood glucose meter kit and supplies KIT   Semaglutide (RYBELSUS) 3 MG  TABS   Other Relevant Orders   Comprehensive metabolic panel   Insulin, random   Hemoglobin A1c   Amb Ref to Medical Weight Management    Other Visit Diagnoses    Primary hypertension       Relevant Orders   Comprehensive metabolic panel   Hyperlipidemia associated with type 2 diabetes mellitus ([place])       Relevant Medications   Semaglutide (RYBELSUS) 3 MG TABS   Other Relevant Orders   Lipid panel   Comprehensive metabolic panel   Morbid obesity ([place])       Relevant Medications   Semaglutide (RYBELSUS) 3 MG TABS   Other Relevant Orders   Amb Ref to Medical Weight Management   Vitamin D (25 hydroxy)   Myalgia       Relevant Orders   Magnesium   Phosphorus      Follow-up: Return in about 3 months (around 04/16/2021) for hyperlipidemia, diabetes II.  Ann Held, DO

## 2021-01-17 NOTE — Patient Instructions (Signed)

## 2021-01-17 NOTE — Assessment & Plan Note (Signed)
Check labs' Start rybelsus 3 mg daily for 1 month  rx glucometer

## 2021-01-18 LAB — COMPREHENSIVE METABOLIC PANEL
ALT: 28 U/L (ref 0–35)
AST: 19 U/L (ref 0–37)
Albumin: 4.7 g/dL (ref 3.5–5.2)
Alkaline Phosphatase: 74 U/L (ref 39–117)
BUN: 12 mg/dL (ref 6–23)
CO2: 29 mEq/L (ref 19–32)
Calcium: 10.2 mg/dL (ref 8.4–10.5)
Chloride: 99 mEq/L (ref 96–112)
Creatinine, Ser: 0.86 mg/dL (ref 0.40–1.20)
GFR: 72.37 mL/min (ref >60.00)
Glucose, Bld: 97 mg/dL (ref 70–99)
Potassium: 4.3 mEq/L (ref 3.5–5.1)
Sodium: 138 mEq/L (ref 135–145)
Total Bilirubin: 0.5 mg/dL (ref 0.2–1.2)
Total Protein: 7.8 g/dL (ref 6.0–8.3)

## 2021-01-18 LAB — LIPID PANEL
Cholesterol: 177 mg/dL (ref 0–200)
HDL: 62.8 mg/dL (ref >39.00)
LDL Cholesterol: 76 mg/dL (ref 0–99)
NonHDL: 114.18
Total CHOL/HDL Ratio: 3
Triglycerides: 191 mg/dL — ABNORMAL HIGH (ref 0.0–149.0)
VLDL: 38.2 mg/dL (ref 0.0–40.0)

## 2021-01-18 LAB — INSULIN, RANDOM: Insulin: 41.1 u[IU]/mL — ABNORMAL HIGH

## 2021-01-18 LAB — MAGNESIUM: Magnesium: 1.9 mg/dL (ref 1.5–2.5)

## 2021-01-18 LAB — PHOSPHORUS: Phosphorus: 4.4 mg/dL (ref 2.3–4.6)

## 2021-01-18 LAB — HEMOGLOBIN A1C: Hgb A1c MFr Bld: 6.2 % (ref 4.6–6.5)

## 2021-01-18 LAB — VITAMIN D 25 HYDROXY (VIT D DEFICIENCY, FRACTURES): VITD: 50.58 ng/mL (ref 30.00–100.00)

## 2021-02-03 ENCOUNTER — Other Ambulatory Visit: Payer: Self-pay | Admitting: Family Medicine

## 2021-02-03 MED FILL — LORAZEPAM 1 MG TABS: 1 | 23 days supply | Qty: 70 | Fill #1

## 2021-02-03 MED FILL — TEMAZEPAM 30 MG CAPSULE: 30 | 30 days supply | Qty: 30 | Fill #0

## 2021-02-03 MED FILL — traZODone HCL 150 MG TABS: 150 | 90 days supply | Qty: 90 | Fill #0

## 2021-02-03 NOTE — Telephone Encounter (Signed)
Requesting: temazepam 30mg  Contract: None UDS: None Last Visit: 01/17/21 w/ Etter Sjogren Next Visit: 04/19/21 Last Refill: 12/13/2020 #70 and 2rf Pt sig: 1/2 to 1 tab q8h prn  Please Advise

## 2021-02-21 ENCOUNTER — Other Ambulatory Visit: Payer: Self-pay | Admitting: Family Medicine

## 2021-02-21 ENCOUNTER — Encounter: Payer: Self-pay | Admitting: Family Medicine

## 2021-02-21 MED ORDER — RYBELSUS 7 MG PO TABS: 7.0000 mg | ORAL_TABLET | Freq: Every day | ORAL | 1 refills | Status: DC

## 2021-02-21 MED FILL — RYBELSUS 7 MG TABS: 7 | 30 days supply | Qty: 30 | Fill #0

## 2021-02-22 ENCOUNTER — Other Ambulatory Visit (HOSPITAL_BASED_OUTPATIENT_CLINIC_OR_DEPARTMENT_OTHER): Payer: Self-pay

## 2021-02-25 ENCOUNTER — Other Ambulatory Visit (HOSPITAL_BASED_OUTPATIENT_CLINIC_OR_DEPARTMENT_OTHER): Payer: Self-pay

## 2021-03-01 MED FILL — METOPROLOL SUCCINATE ER 100: 100 | 90 days supply | Qty: 180 | Fill #2

## 2021-03-01 MED FILL — ATORVASTATIN CALCIUM 10 MG: 10 | 90 days supply | Qty: 90 | Fill #1

## 2021-03-10 ENCOUNTER — Other Ambulatory Visit (HOSPITAL_COMMUNITY): Payer: Self-pay

## 2021-03-10 MED FILL — Temazepam Cap 30 MG: ORAL | 30 days supply | Qty: 30 | Fill #0 | Status: AC

## 2021-03-11 ENCOUNTER — Other Ambulatory Visit (HOSPITAL_COMMUNITY): Payer: Self-pay

## 2021-03-26 ENCOUNTER — Encounter (INDEPENDENT_AMBULATORY_CARE_PROVIDER_SITE_OTHER): Payer: Self-pay

## 2021-03-31 ENCOUNTER — Other Ambulatory Visit (HOSPITAL_COMMUNITY): Payer: Self-pay

## 2021-03-31 MED FILL — Semaglutide Tab 7 MG: ORAL | 30 days supply | Qty: 30 | Fill #0 | Status: AC

## 2021-04-01 ENCOUNTER — Other Ambulatory Visit (HOSPITAL_COMMUNITY): Payer: Self-pay

## 2021-04-02 ENCOUNTER — Other Ambulatory Visit (HOSPITAL_COMMUNITY): Payer: Self-pay

## 2021-04-04 ENCOUNTER — Other Ambulatory Visit (HOSPITAL_COMMUNITY): Payer: Self-pay

## 2021-04-10 MED FILL — Temazepam Cap 30 MG: ORAL | 30 days supply | Qty: 30 | Fill #1 | Status: AC

## 2021-04-11 ENCOUNTER — Other Ambulatory Visit (HOSPITAL_COMMUNITY): Payer: Self-pay

## 2021-04-19 ENCOUNTER — Telehealth: Payer: 59 | Admitting: Family Medicine

## 2021-04-19 ENCOUNTER — Other Ambulatory Visit: Payer: Self-pay

## 2021-04-26 ENCOUNTER — Other Ambulatory Visit: Payer: Self-pay | Admitting: Family Medicine

## 2021-04-26 ENCOUNTER — Other Ambulatory Visit (HOSPITAL_COMMUNITY): Payer: Self-pay

## 2021-04-26 ENCOUNTER — Encounter: Payer: Self-pay | Admitting: Family Medicine

## 2021-04-26 MED ORDER — ONDANSETRON HCL 4 MG PO TABS
4.0000 mg | ORAL_TABLET | Freq: Three times a day (TID) | ORAL | 2 refills | Status: DC | PRN
  Filled 2021-04-26: qty 40, 14d supply, fill #0

## 2021-05-03 ENCOUNTER — Other Ambulatory Visit: Payer: Self-pay

## 2021-05-03 ENCOUNTER — Ambulatory Visit: Payer: 59 | Admitting: Family Medicine

## 2021-05-03 ENCOUNTER — Encounter: Payer: Self-pay | Admitting: Family Medicine

## 2021-05-03 VITALS — BP 126/80 | HR 90 | Temp 98.5°F | Resp 12 | Ht 64.0 in | Wt 185.6 lb

## 2021-05-03 DIAGNOSIS — E559 Vitamin D deficiency, unspecified: Secondary | ICD-10-CM | POA: Diagnosis not present

## 2021-05-03 DIAGNOSIS — R000 Tachycardia, unspecified: Secondary | ICD-10-CM | POA: Diagnosis not present

## 2021-05-03 DIAGNOSIS — F418 Other specified anxiety disorders: Secondary | ICD-10-CM

## 2021-05-03 DIAGNOSIS — E782 Mixed hyperlipidemia: Secondary | ICD-10-CM | POA: Diagnosis not present

## 2021-05-03 DIAGNOSIS — E1165 Type 2 diabetes mellitus with hyperglycemia: Secondary | ICD-10-CM | POA: Diagnosis not present

## 2021-05-03 DIAGNOSIS — K219 Gastro-esophageal reflux disease without esophagitis: Secondary | ICD-10-CM

## 2021-05-03 DIAGNOSIS — R109 Unspecified abdominal pain: Secondary | ICD-10-CM

## 2021-05-03 DIAGNOSIS — K589 Irritable bowel syndrome without diarrhea: Secondary | ICD-10-CM | POA: Diagnosis not present

## 2021-05-03 NOTE — Assessment & Plan Note (Signed)
Encouraged heart healthy diet, increase exercise, avoid trans fats, consider a krill oil cap daily 

## 2021-05-03 NOTE — Assessment & Plan Note (Signed)
Mild, asymptomatic, with leukopenia. 

## 2021-05-03 NOTE — Assessment & Plan Note (Signed)
H/o hiatal hernia and now her reflux is escalating she is using her Protonix and Pepcid and she still needs Maalox several time a day and she goes through 2 bottles a year. Avoid offending foods, she has been eating better. She is on Rybelsus and that may be contributing to her symptoms so she will hold it for a few weeks to see if it helps her symptoms

## 2021-05-03 NOTE — Patient Instructions (Signed)
Hiatal Hernia  A hiatal hernia occurs when part of the stomach slides above the muscle that separates the abdomen from the chest (diaphragm). A person can be born with a hiatal hernia (congenital), or it may develop over time. In almost all cases of hiatal hernia, only the top part of the stomach pushes through the diaphragm. Many people have a hiatal hernia with no symptoms. The larger the hernia, the more likely it is that you will have symptoms. In some cases, a hiatal hernia allows stomach acid to flow back into the tube that carries food from your mouth to your stomach (esophagus). This may cause heartburn symptoms. Severe heartburn symptoms may mean that you have developed a condition called gastroesophageal reflux disease (GERD). What are the causes? This condition is caused by a weakness in the opening (hiatus) where the esophagus passes through the diaphragm to attach to the upper part of the stomach. A person may be born with a weakness in the hiatus, or a weakness can develop over time. What increases the risk? This condition is more likely to develop in:  Older people. Age is a major risk factor for a hiatal hernia, especially if you are over the age of 50.  Pregnant women.  People who are overweight.  People who have frequent constipation. What are the signs or symptoms? Symptoms of this condition usually develop in the form of GERD symptoms. Symptoms include:  Heartburn.  Belching.  Indigestion.  Trouble swallowing.  Coughing or wheezing.  Sore throat.  Hoarseness.  Chest pain.  Nausea and vomiting. How is this diagnosed? This condition may be diagnosed during testing for GERD. Tests that may be done include:  X-rays of your stomach or chest.  An upper gastrointestinal (GI) series. This is an X-ray exam of your GI tract that is taken after you swallow a chalky liquid that shows up clearly on the X-ray.  Endoscopy. This is a procedure to look into your stomach  using a thin, flexible tube that has a tiny camera and light on the end of it. How is this treated? This condition may be treated by:  Dietary and lifestyle changes to help reduce GERD symptoms.  Medicines. These may include: ? Over-the-counter antacids. ? Medicines that make your stomach empty more quickly. ? Medicines that block the production of stomach acid (H2 blockers). ? Stronger medicines to reduce stomach acid (proton pump inhibitors).  Surgery to repair the hernia, if other treatments are not helping. If you have no symptoms, you may not need treatment. Follow these instructions at home: Lifestyle and activity  Do not use any products that contain nicotine or tobacco, such as cigarettes and e-cigarettes. If you need help quitting, ask your health care provider.  Try to achieve and maintain a healthy body weight.  Avoid putting pressure on your abdomen. Anything that puts pressure on your abdomen increases the amount of acid that may be pushed up into your esophagus. ? Avoid bending over, especially after eating. ? Raise the head of your bed by putting blocks under the legs. This keeps your head and esophagus higher than your stomach. ? Do not wear tight clothing around your chest or stomach. ? Try not to strain when having a bowel movement, when urinating, or when lifting heavy objects. Eating and drinking  Avoid foods that can worsen GERD symptoms. These may include: ? Fatty foods, like fried foods. ? Citrus fruits, like oranges or lemon. ? Other foods and drinks that contain acid, like   orange juice or tomatoes. ? Spicy food. ? Chocolate.  Eat frequent small meals instead of three large meals a day. This helps prevent your stomach from getting too full. ? Eat slowly. ? Do not lie down right after eating. ? Do not eat 1-2 hours before bed.  Do not drink beverages with caffeine. These include cola, coffee, cocoa, and tea.  Do not drink alcohol. General  instructions  Take over-the-counter and prescription medicines only as told by your health care provider.  Keep all follow-up visits as told by your health care provider. This is important. Contact a health care provider if:  Your symptoms are not controlled with medicines or lifestyle changes.  You are having trouble swallowing.  You have coughing or wheezing that will not go away. Get help right away if:  Your pain is getting worse.  Your pain spreads to your arms, neck, jaw, teeth, or back.  You have shortness of breath.  You sweat for no reason.  You feel sick to your stomach (nauseous) or you vomit.  You vomit blood.  You have bright red blood in your stools.  You have black, tarry stools. This information is not intended to replace advice given to you by your health care provider. Make sure you discuss any questions you have with your health care provider. Document Revised: 11/02/2017 Document Reviewed: 06/25/2017 Elsevier Patient Education  [place].

## 2021-05-03 NOTE — Progress Notes (Signed)
Patient ID: [name] Jensen, female    DOB: 1956/02/04  Age: 65 y.o. MRN: [id number]    Subjective:  Subjective  HPI [name] Jensen presents for office visit today for follow up on diabetes and acid reflux secondary to her hiatal hernia. She expresses interest in getting surgery to treat her hiatal hernia. She states that her acid reflux is getting worse, even though she made diet changes and was able to go down to 178 lbs. She states . She denies any chest pain, SOB, fever, abdominal pain, cough, chills, sore throat, dysuria, urinary incontinence, back pain, HA, or N/V. She reports that she does not feel as bloated with the diet changes she made. She states that she has been feeling more fatigue and tiredness lately.   Review of Systems  Constitutional: Positive for fatigue. Negative for chills and fever.  HENT: Negative for congestion, rhinorrhea, sinus pressure, sinus pain and sore throat.   Eyes: Negative for pain.  Respiratory: Negative for cough and shortness of breath.   Cardiovascular: Negative for chest pain, palpitations and leg swelling.  Gastrointestinal: Positive for diarrhea and nausea. Negative for abdominal pain, blood in stool and vomiting.  Genitourinary: Negative for decreased urine volume, flank pain, frequency, vaginal bleeding and vaginal discharge.  Musculoskeletal: Negative for back pain.  Neurological: Negative for headaches.    History Past Medical History:  Diagnosis Date  . Adjustment reaction with anxiety and depression 06/12/2014  . Anemia    h/o low ferritin  . Anxiety   . Asthma    environmental triggers  . Chicken pox as a child  . Cough 08/22/2015  . Depression   . Depression with anxiety 06/12/2014  . Diverticulosis   . Ehrlichiosis 3704  . Gallstones   . GERD (gastroesophageal reflux disease)   . Hair loss 03/28/2017  . Hiatal hernia 2007  . History of viral illness   . Hyperlipidemia, mixed 04/11/2015  . Insomnia 08/21/2017  . Lactose  intolerance 06/12/2014  . Mumps as a child  . Overweight 08/19/2014  . Rectocele 06/12/2014  . RLS (restless legs syndrome)   . 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 Sessile colonic polyp   . SVT (supraventricular tachycardia) ([place])   . Tachycardia 06/12/2014  . Thyroid disease   . Vitamin D deficiency 08/22/2015    She has a past surgical history that includes Cholecystectomy; Appendectomy; Tonsillectomy (1972); Wisdom tooth extraction (65 yrs old); Abdominal hysterectomy (11-2008); Skin surgery; Colonoscopy (2006); Esophagogastroduodenoscopy (2006); svt ablation; Incontinence surgery; and Esophagogastroduodenoscopy (N/A, 02/21/2018).   Her family history includes Atrial fibrillation in her maternal grandmother; Diabetes in her paternal grandmother and sister; Fibromyalgia in her sister; GI Bleed in her maternal grandmother; Heart disease in her maternal grandfather; Hypertension in her father, mother, and sister; Proteinuria in her sister; Stroke in her paternal grandfather.She reports that she has never smoked. She has never used smokeless tobacco. She reports that she does not drink alcohol and does not use drugs.  Current Outpatient Medications on File Prior to Visit  Medication Sig Dispense Refill  . atorvastatin (LIPITOR) 10 MG tablet TAKE 1 TABLET BY MOUTH DAILY 90 tablet 1  . blood glucose meter kit and supplies KIT Dispense based on patient and insurance preference. Use up to four times daily as directed. (FOR ICD-9 250.00, 250.01). 1 each 0  . Blood Glucose Monitoring Suppl (FREESTYLE LITE) w/Device KIT USE TO CHECK BLOOD SUGAR UP TO 4 TIMES DAILY 1 kit 0  .  Cholecalciferol (VITAMIN D3) 5000 units CAPS Take 5,000 Units by mouth daily.     . colesevelam (WELCHOL) 625 MG tablet TAKE 3 TABLETS BY MOUTH TWICE DAILY WITH MEALS 180 tablet 0  . dicyclomine (BENTYL) 10 MG capsule Take 1 capsule (10 mg total) by mouth 3 (three) times daily before meals. 270 capsule  3  . famotidine (PEPCID) 40 MG tablet TAKE 1 TABLET BY MOUTH 2 TIMES DAILY 60 tablet 5  . glucose blood test strip USE TO CHECK BLOOD SUGAR 4 TIMES A DAY (Patient taking differently: USE TO CHECK BLOOD SUGAR 4 TIMES A DAY) 100 strip 0  . Hyoscyamine Sulfate SL 0.125 MG SUBL PLACE 1 TABLET UNDER THE TONGUE EVERY 4 HOURS AS NEEDED. 40 tablet 2  . ketoconazole (NIZORAL) 2 % cream APPLY TO THE AFFECTED AREA(S) TWO TIMES DAILY (Patient taking differently: Apply topically 2 (two) times daily. to affected area(s)) 60 g 1  . Lancets (FREESTYLE) lancets USE TO CHECK BLOOD SUGAR 4 TIMES A DAY (Patient taking differently: USE TO CHECK BLOOD SUGAR 4 TIMES A DAY) 100 each 0  . LORazepam (ATIVAN) 1 MG tablet TAKE 1/2 TO 1 TABLET BY MOUTH EVERY 8 HOURS AS NEEDED FOR ANXIETY 70 tablet 2  . metoprolol succinate (TOPROL-XL) 100 MG 24 hr tablet TAKE 1 TABLET BY MOUTH TWICE A DAY, TAKE WITH OR IMMEDIATELY FOLLOWING A MEAL. 180 tablet 2  . ondansetron (ZOFRAN) 4 MG tablet Take 1 tablet (4 mg total) by mouth every 8 (eight) hours as needed for nausea or vomiting. 40 tablet 2  . pantoprazole (PROTONIX) 40 MG tablet TAKE 1 TABLET (40 MG TOTAL) BY MOUTH TWICE A DAY BEFORE A MEAL. 180 tablet 2  . Semaglutide 7 MG TABS TAKE 1 TABLET BY MOUTH ONCE A DAY 30 tablet 1  . temazepam (RESTORIL) 30 MG capsule TAKE 1 CAPSULE BY MOUTH ONCE DAILY AT BEDTIME 30 capsule 5  . traZODone (DESYREL) 150 MG tablet TAKE 1 TABLET BY MOUTH EVERY NIGHT AT BEDTIME 90 tablet 1  . triamcinolone (KENALOG) 0.1 % APPLY TO AFFECTED AREA TWO TIMES DAILY FOR 2-3 WEEKS AS NEEDED FOR FLARES/ITCHING 80 g 0  . [DISCONTINUED] metFORMIN (GLUCOPHAGE XR) 500 MG 24 hr tablet Take 1 tablet (500 mg total) by mouth in the morning and at bedtime. (Patient not taking: Reported on 01/17/2021) 60 tablet 3   No current facility-administered medications on file prior to visit.     Objective:  Objective  Physical Exam Constitutional:      General: She is not in acute  distress.    Appearance: Normal appearance. She is not ill-appearing or toxic-appearing.  HENT:     Head: Normocephalic and atraumatic.     Right Ear: Tympanic membrane, ear canal and external ear normal.     Left Ear: Tympanic membrane, ear canal and external ear normal.     Nose: No congestion or rhinorrhea.  Eyes:     Extraocular Movements: Extraocular movements intact.     Pupils: Pupils are equal, round, and reactive to light.  Cardiovascular:     Rate and Rhythm: Normal rate and regular rhythm.     Pulses: Normal pulses.     Heart sounds: Normal heart sounds. No murmur heard.   Pulmonary:     Effort: Pulmonary effort is normal. No respiratory distress.     Breath sounds: Normal breath sounds. No wheezing, rhonchi or rales.  Abdominal:     General: Bowel sounds are normal.     Palpations:  Abdomen is soft. There is no mass.     Tenderness: There is no abdominal tenderness. There is no guarding.     Hernia: No hernia is present.  Musculoskeletal:        General: Normal range of motion.     Cervical back: Normal range of motion and neck supple.  Skin:    General: Skin is warm and dry.  Neurological:     Mental Status: She is alert and oriented to person, place, and time.  Psychiatric:        Behavior: Behavior normal.    BP 126/80 (BP Location: Right Arm, Cuff Size: Large)   Pulse 90   Temp 98.5 F (36.9 C) (Oral)   Resp 12   Ht $R'5\' 4"'Ae$  (1.626 m)   Wt 185 lb 9.6 oz (84.2 kg)   LMP  (LMP Unknown)   SpO2 99%   BMI 31.86 kg/m  Wt Readings from Last 3 Encounters:  05/03/21 185 lb 9.6 oz (84.2 kg)  01/17/21 200 lb 12.8 oz (91.1 kg)  10/14/20 182 lb (82.6 kg)     Lab Results  Component Value Date   WBC 6.7 09/14/2020   HGB 13.5 09/14/2020   HCT 39.5 09/14/2020   PLT 285 09/14/2020   GLUCOSE 97 01/17/2021   CHOL 177 01/17/2021   TRIG 191.0 (H) 01/17/2021   HDL 62.80 01/17/2021   LDLDIRECT 72.0 04/26/2020   LDLCALC 76 01/17/2021   ALT 28 01/17/2021   AST 19  01/17/2021   NA 138 01/17/2021   K 4.3 01/17/2021   CL 99 01/17/2021   CREATININE 0.86 01/17/2021   BUN 12 01/17/2021   CO2 29 01/17/2021   TSH 1.41 09/14/2020   INR 1.0 11/09/2008   HGBA1C 6.2 01/17/2021    MM 3D SCREEN BREAST BILATERAL  Result Date: 11/25/2020 CLINICAL DATA:  Screening. EXAM: DIGITAL SCREENING BILATERAL MAMMOGRAM WITH TOMO AND CAD COMPARISON:  Previous exam(s). ACR Breast Density Category b: There are scattered areas of fibroglandular density. FINDINGS: There are no findings suspicious for malignancy. Images were processed with CAD. IMPRESSION: No mammographic evidence of malignancy. A result letter of this screening mammogram will be mailed directly to the patient. RECOMMENDATION: Screening mammogram in one year. (Code:SM-B-01Y) BI-RADS CATEGORY  1: Negative. Electronically Signed   By: Lajean Manes M.D.   On: 11/25/2020 09:50     Assessment & Plan:  Plan    No orders of the defined types were placed in this encounter.   Problem List Items Addressed This Visit    Tachycardia    Mild, asymptomatic      Relevant Orders   CBC   Comprehensive metabolic panel   TSH   Depression with anxiety    Is managing her stress fairly well considering the chronic nature of her depression. No changes to meds.       GERD (gastroesophageal reflux disease) - Primary    H/o hiatal hernia and now her reflux is escalating she is using her Protonix and Pepcid and she still needs Maalox several time a day and she goes through 2 bottles a year. Avoid offending foods, she has been eating better. She is on Rybelsus and that may be contributing to her symptoms so she will hold it for a few weeks to see if it helps her symptoms      Relevant Orders   Ambulatory referral to Gastroenterology   Abdominal pain   Relevant Orders   Ambulatory referral to Gastroenterology   Hyperlipidemia,  mixed    Encouraged heart healthy diet, increase exercise, avoid trans fats, consider a krill oil  cap daily      Relevant Orders   Lipid panel   Vitamin D deficiency    Supplement and monitor      Relevant Orders   VITAMIN D 25 Hydroxy (Vit-D Deficiency, Fractures)   IBS (irritable bowel syndrome)   Uncontrolled type 2 diabetes mellitus with hyperglycemia ([place])    rybelsus has been helpful but she is having many sypmtoms which rylebsus could be contributing to so we will hold her rybelsus and see if her reflux, abdominal discomfort, nausea improve and monitor her sugars.       Relevant Orders   Hemoglobin A1c   Insulin, random      Follow-up: Return in about 3 months (around 08/03/2021), or 3 mn f/u and 6 month CPE.   I,David Hanna,acting as a scribe for Penni Homans, MD.,have documented all relevant documentation on the behalf of Penni Homans, MD,as directed by  Penni Homans, MD while in the presence of Penni Homans, MD.  I, [name], MD personally performed the services described in this documentation. All medical record entries made by the scribe were at my direction and in my presence. I have reviewed the chart and agree that the record reflects my personal performance and is accurate and complete

## 2021-05-03 NOTE — Assessment & Plan Note (Addendum)
Supplement and monitor 

## 2021-05-03 NOTE — Assessment & Plan Note (Signed)
Is managing her stress fairly well considering the chronic nature of her depression. No changes to meds.

## 2021-05-04 LAB — HEMOGLOBIN A1C: Hgb A1c MFr Bld: 5.9 % (ref 4.6–6.5)

## 2021-05-04 LAB — COMPREHENSIVE METABOLIC PANEL
ALT: 26 U/L (ref 0–35)
AST: 19 U/L (ref 0–37)
Albumin: 4.7 g/dL (ref 3.5–5.2)
Alkaline Phosphatase: 77 U/L (ref 39–117)
BUN: 16 mg/dL (ref 6–23)
CO2: 27 mEq/L (ref 19–32)
Calcium: 9.9 mg/dL (ref 8.4–10.5)
Chloride: 102 mEq/L (ref 96–112)
Creatinine, Ser: 0.81 mg/dL (ref 0.40–1.20)
GFR: 77.61 mL/min (ref >60.00)
Glucose, Bld: 108 mg/dL — ABNORMAL HIGH (ref 70–99)
Potassium: 3.9 mEq/L (ref 3.5–5.1)
Sodium: 139 mEq/L (ref 135–145)
Total Bilirubin: 0.5 mg/dL (ref 0.2–1.2)
Total Protein: 7.4 g/dL (ref 6.0–8.3)

## 2021-05-04 LAB — CBC
HCT: 40 % (ref 36.0–46.0)
Hemoglobin: 13.9 g/dL (ref 12.0–15.0)
MCHC: 34.6 g/dL (ref 30.0–36.0)
MCV: 82.6 fl (ref 78.0–100.0)
Platelets: 312 10*3/uL (ref 150.0–400.0)
RBC: 4.85 Mil/uL (ref 3.87–5.11)
RDW: 13.3 % (ref 11.5–15.5)
WBC: 7.1 10*3/uL (ref 4.0–10.5)

## 2021-05-04 LAB — LIPID PANEL
Cholesterol: 142 mg/dL (ref 0–200)
HDL: 49.8 mg/dL (ref >39.00)
LDL Cholesterol: 63 mg/dL (ref 0–99)
NonHDL: 91.92
Total CHOL/HDL Ratio: 3
Triglycerides: 146 mg/dL (ref 0.0–149.0)
VLDL: 29.2 mg/dL (ref 0.0–40.0)

## 2021-05-04 LAB — TSH: TSH: 1.78 u[IU]/mL (ref 0.35–4.50)

## 2021-05-04 LAB — VITAMIN D 25 HYDROXY (VIT D DEFICIENCY, FRACTURES): VITD: 47.17 ng/mL (ref 30.00–100.00)

## 2021-05-04 LAB — INSULIN, RANDOM: Insulin: 61.3 u[IU]/mL — ABNORMAL HIGH

## 2021-05-04 NOTE — Assessment & Plan Note (Signed)
Has achieved good weight loss since her last visit with Rybelsus and dietary changes. Will need to hold the Rylebsus

## 2021-05-04 NOTE — Assessment & Plan Note (Signed)
rybelsus has been helpful but she is having many sypmtoms which rylebsus could be contributing to so we will hold her rybelsus and see if her reflux, abdominal discomfort, nausea improve and monitor her sugars.

## 2021-05-15 ENCOUNTER — Other Ambulatory Visit: Payer: Self-pay | Admitting: Family Medicine

## 2021-05-15 DIAGNOSIS — E782 Mixed hyperlipidemia: Secondary | ICD-10-CM

## 2021-05-15 MED FILL — Temazepam Cap 30 MG: ORAL | 30 days supply | Qty: 30 | Fill #2 | Status: AC

## 2021-05-15 MED FILL — Trazodone HCl Tab 150 MG: ORAL | 90 days supply | Qty: 90 | Fill #0 | Status: AC

## 2021-05-16 ENCOUNTER — Other Ambulatory Visit (HOSPITAL_COMMUNITY): Payer: Self-pay

## 2021-05-16 MED ORDER — ATORVASTATIN CALCIUM 10 MG PO TABS
ORAL_TABLET | Freq: Every day | ORAL | 1 refills | Status: DC
  Filled 2021-05-16: qty 90, 90d supply, fill #0
  Filled 2021-12-08: qty 90, 90d supply, fill #1

## 2021-05-16 MED FILL — Famotidine Tab 40 MG: ORAL | 30 days supply | Qty: 60 | Fill #0 | Status: AC

## 2021-05-17 ENCOUNTER — Encounter: Payer: Self-pay | Admitting: Family Medicine

## 2021-05-17 ENCOUNTER — Other Ambulatory Visit: Payer: Self-pay | Admitting: Family Medicine

## 2021-05-17 MED ORDER — OZEMPIC (0.25 OR 0.5 MG/DOSE) 2 MG/1.5ML ~~LOC~~ SOPN
0.2500 mg | PEN_INJECTOR | SUBCUTANEOUS | 0 refills | Status: DC
  Filled 2021-05-17: qty 1.5, 56d supply, fill #0

## 2021-05-18 ENCOUNTER — Other Ambulatory Visit (HOSPITAL_COMMUNITY): Payer: Self-pay

## 2021-06-15 ENCOUNTER — Ambulatory Visit: Payer: 59 | Admitting: Gastroenterology

## 2021-06-15 ENCOUNTER — Other Ambulatory Visit (HOSPITAL_COMMUNITY): Payer: Self-pay

## 2021-06-15 ENCOUNTER — Encounter: Payer: Self-pay | Admitting: Gastroenterology

## 2021-06-15 VITALS — BP 120/74 | HR 88 | Ht 64.0 in | Wt 188.4 lb

## 2021-06-15 DIAGNOSIS — K580 Irritable bowel syndrome with diarrhea: Secondary | ICD-10-CM

## 2021-06-15 DIAGNOSIS — R112 Nausea with vomiting, unspecified: Secondary | ICD-10-CM

## 2021-06-15 DIAGNOSIS — K2100 Gastro-esophageal reflux disease with esophagitis, without bleeding: Secondary | ICD-10-CM | POA: Diagnosis not present

## 2021-06-15 MED ORDER — ESOMEPRAZOLE MAGNESIUM 40 MG PO CPDR
40.0000 mg | DELAYED_RELEASE_CAPSULE | Freq: Two times a day (BID) | ORAL | 11 refills | Status: DC
  Filled 2021-06-15: qty 60, 30d supply, fill #0
  Filled 2021-08-11: qty 180, 90d supply, fill #1

## 2021-06-15 MED FILL — Temazepam Cap 30 MG: ORAL | 30 days supply | Qty: 30 | Fill #3 | Status: AC

## 2021-06-15 NOTE — Patient Instructions (Signed)
We have sent the following medications to your pharmacy for you to pick up at your convenience: Nexium 40 mg twice daily.   You have been scheduled for an endoscopy. Please follow written instructions given to you at your visit today. If you use inhalers (even only as needed), please bring them with you on the day of your procedure.  You have been scheduled for a gastric emptying scan at [facility] admitting on 06/23/21 at 7am. Please arrive at least 30 minutes prior to your appointment for registration. Please make certain not to have anything to eat or drink after midnight the night before your test. Hold all stomach medications (ex: Zofran, phenergan, Reglan, Nexium) 24 hours prior to your test. If you need to reschedule your appointment, please contact radiology scheduling at [phone]. __________________________________________________________ A gastric-emptying study measures how long it takes for food to move through your stomach. There are several ways to measure stomach emptying. In the most common test, you eat food that contains a small amount of radioactive material. A scanner that detects the movement of the radioactive material is placed over your abdomen to monitor the rate at which food leaves your stomach. This test normally takes about 4 hours to complete. __________________________________________________________  Due to recent changes in healthcare laws, you may see the results of your imaging and laboratory studies on MyChart before your provider has had a chance to review them.  We understand that in some cases there may be results that are confusing or concerning to you. Not all laboratory results come back in the same time frame and the provider may be waiting for multiple results in order to interpret others.  Please give [place] 48 hours in order for your provider to thoroughly review all the results before contacting the office for clarification of your results.   The [facility]  providers would like to encourage you to use [facility] to communicate with providers for non-urgent requests or questions.  Due to long hold times on the telephone, sending your provider a message by [facility] may be a faster and more efficient way to get a response.  Please allow 48 business hours for a response.  Please remember that this is for non-urgent requests.   Normal BMI (Body Mass Index- based on height and weight) is between 19 and 25. Your BMI today is Body mass index is 32.34 kg/m. Marland Kitchen Please consider follow up  regarding your BMI with your Primary Care Provider.  Thank you for choosing me and [facility].  Pricilla Riffle. Dagoberto Ligas., MD., Marval Regal

## 2021-06-15 NOTE — Progress Notes (Signed)
    History of Present Illness: This is a 65 year old female complaining of worsening reflux problems.  She notes frequent postprandial and nocturnal reflux despite carefully following a diet.  She is taking increased amounts of Maalox and Tums to try to control her symptoms in addition to taking pantoprazole twice daily and famotidine at bedtime.  Diarrhea and abdominal pain are fairly well controlled on WelChol and antispasmodics.    EGD 02/2018  - Normal proximal esophagus and mid esophagus. - LA Grade A reflux esophagitis. - Z-line irregular, 33 cm from the incisors. - 2 cm hiatal hernia. - Gastritis. Biopsied. - Normal duodenal bulb and second portion of the duodenum.   Current Medications, Allergies, Past Medical History, Past Surgical History, Family History and Social History were reviewed in [employer] record.   Physical Exam: General: Well developed, well nourished, no acute distress Head: Normocephalic and atraumatic Eyes: Sclerae anicteric, EOMI Ears: Normal auditory acuity Mouth: Not examined, mask on during Covid-19 pandemic Lungs: Clear throughout to auscultation Heart: Regular rate and rhythm; no murmurs, rubs or bruits Abdomen: Soft, non tender and non distended. No masses, hepatosplenomegaly or hernias noted. Normal Bowel sounds Rectal: Not done  Musculoskeletal: Symmetrical with no gross deformities  Pulses:  Normal pulses noted Extremities: No clubbing, cyanosis, edema or deformities noted Neurological: Alert oriented x 4, grossly nonfocal Psychological:  Alert and cooperative. Normal mood and affect   Assessment and Recommendations:  Refractory GERD. R/O esophagitis, gastroparesis. Change to Nexium 40 mg po bid. continue famotidine 40 mg at bedtime.  Closely follow antireflux measures.  Elevate HOB 4 inches.  Schedule gastric emptying scan.  Schedule EGD. The risks (including bleeding, perforation, infection, missed lesions, medication  reactions and possible hospitalization or surgery if complications occur), benefits, and alternatives to endoscopy with possible biopsy and possible dilation were discussed with the patient and they consent to proceed.   IBS-D. Bile salt diarrhea. Continue Welchol 1-2 po qd prn diarrhea. Continue Dicyclomine 10 mg po tid prn and hyoscyamine 0.125 mg po q4h prn.  Personal history of SSP and TA. Surveillance is recommended in colonoscopy in March 2027.

## 2021-06-16 ENCOUNTER — Other Ambulatory Visit (HOSPITAL_COMMUNITY): Payer: Self-pay

## 2021-06-17 ENCOUNTER — Encounter: Payer: Self-pay | Admitting: Gastroenterology

## 2021-06-17 ENCOUNTER — Other Ambulatory Visit: Payer: Self-pay

## 2021-06-17 ENCOUNTER — Ambulatory Visit (AMBULATORY_SURGERY_CENTER): Payer: 59 | Admitting: Gastroenterology

## 2021-06-17 VITALS — BP 104/62 | HR 72 | Temp 97.7°F | Resp 16 | Ht 64.0 in | Wt 188.0 lb

## 2021-06-17 DIAGNOSIS — K449 Diaphragmatic hernia without obstruction or gangrene: Secondary | ICD-10-CM | POA: Diagnosis not present

## 2021-06-17 DIAGNOSIS — K2100 Gastro-esophageal reflux disease with esophagitis, without bleeding: Secondary | ICD-10-CM | POA: Diagnosis not present

## 2021-06-17 DIAGNOSIS — K319 Disease of stomach and duodenum, unspecified: Secondary | ICD-10-CM | POA: Diagnosis not present

## 2021-06-17 DIAGNOSIS — K219 Gastro-esophageal reflux disease without esophagitis: Secondary | ICD-10-CM | POA: Diagnosis not present

## 2021-06-17 MED ORDER — SODIUM CHLORIDE 0.9 % IV SOLN
500.0000 mL | Freq: Once | INTRAVENOUS | Status: DC
Start: 2021-06-17 — End: 2021-06-17

## 2021-06-17 NOTE — Progress Notes (Signed)
Pt's states no medical or surgical changes since previsit or office visit. 

## 2021-06-17 NOTE — Op Note (Signed)
[facility] Patient Name: [name] Jensen Procedure Date: 06/17/2021 4:22 PM MRN: [id number] Endoscopist: Ladene Artist , MD Age: 65 Referring MD:  Date of Birth: 1956/11/30 Gender: Female Account #: [id number] Procedure:                Upper GI endoscopy Indications:              Gastroesophageal reflux disease Medicines:                Monitored Anesthesia Care Procedure:                Pre-Anesthesia Assessment:                           - Prior to the procedure, a History and Physical                            was performed, and patient medications and                            allergies were reviewed. The patient's tolerance of                            previous anesthesia was also reviewed. The risks                            and benefits of the procedure and the sedation                            options and risks were discussed with the patient.                            All questions were answered, and informed consent                            was obtained. Prior Anticoagulants: The patient has                            taken no previous anticoagulant or antiplatelet                            agents. ASA Grade Assessment: II - A patient with                            mild systemic disease. After reviewing the risks                            and benefits, the patient was deemed in                            satisfactory condition to undergo the procedure.                           After obtaining informed consent, the endoscope was  passed under direct vision. Throughout the                            procedure, the patient's blood pressure, pulse, and                            oxygen saturations were monitored continuously. The                            GIF [id number] #[id number] was introduced through the                            mouth, and advanced to the second part of duodenum.                            The upper GI  endoscopy was accomplished without                            difficulty. The patient tolerated the procedure                            well. Scope In: Scope Out: Findings:                 LA Grade B (one or more mucosal breaks greater than                            5 mm, not extending between the tops of two mucosal                            folds) esophagitis with no bleeding was found at                            the gastroesophageal junction.                           The exam of the esophagus was otherwise normal.                           Patchy mildly erythematous mucosa without bleeding                            was found in the entire examined stomach. Biopsies                            were taken with a cold forceps for histology.                           A medium-sized hiatal hernia was present.                           The exam of the stomach was otherwise normal.  The duodenal bulb and second portion of the                            duodenum were normal. Complications:            No immediate complications. Estimated Blood Loss:     Estimated blood loss was minimal. Impression:               - LA Grade B reflux esophagitis with no bleeding.                           - Erythematous mucosa in the stomach. Biopsied.                           - Medium-sized hiatal hernia.                           - Normal duodenal bulb and second portion of the                            duodenum. Recommendation:           - Patient has a contact number available for                            emergencies. The signs and symptoms of potential                            delayed complications were discussed with the                            patient. Return to normal activities tomorrow.                            Written discharge instructions were provided to the                            patient.                           - Resume previous diet.                            - Closely follow all antireflux measures.                           - Continue present medications.                           - Await pathology results.                           - Proceed wtih GES as scheduled.                           - Return to GI office in 6 weeks. Ladene Artist, MD 06/17/2021 4:39:36 PM This report has been signed electronically.

## 2021-06-17 NOTE — Progress Notes (Signed)
PT taken to PACU. Monitors in place. VSS. Report given to RN. 

## 2021-06-17 NOTE — Progress Notes (Signed)
Called to room to assist during endoscopic procedure.  Patient ID and intended procedure confirmed with present staff. Received instructions for my participation in the procedure from the performing physician.  

## 2021-06-17 NOTE — Patient Instructions (Signed)
Read all of the handouts given to  you by your recovery room nurse. Also try to adapt your diet per the GERD handouts.  YOU HAD AN ENDOSCOPIC PROCEDURE TODAY AT [facility]:   Refer to the procedure report that was given to you for any specific questions about what was found during the examination.  If the procedure report does not answer your questions, please call your gastroenterologist to clarify.  If you requested that your care partner not be given the details of your procedure findings, then the procedure report has been included in a sealed envelope for you to review at your convenience later.  YOU SHOULD EXPECT: Some feelings of bloating in the abdomen. Passage of more gas than usual.  Walking can help get rid of the air that was put into your GI tract during the procedure and reduce the bloating.  Please Note:  You might notice some irritation and congestion in your nose or some drainage.  This is from the oxygen used during your procedure.  There is no need for concern and it should clear up in a day or so.  SYMPTOMS TO REPORT IMMEDIATELY:   Following upper endoscopy (EGD)  Vomiting of blood or coffee ground material  New chest pain or pain under the shoulder blades  Painful or persistently difficult swallowing  New shortness of breath  Fever of 100F or higher  Black, tarry-looking stools  For urgent or emergent issues, a gastroenterologist can be reached at any hour by calling [phone]. Do not use MyChart messaging for urgent concerns.    DIET:  We do recommend a small meal at first, but then you may proceed to your regular diet.  Drink plenty of fluids but you should avoid alcoholic beverages for 24 hours.  ACTIVITY:  You should plan to take it easy for the rest of today and you should NOT DRIVE or use heavy machinery until tomorrow (because of the sedation medicines used during the test).    FOLLOW UP: Our staff will call the number listed on your  records 48-72 hours following your procedure to check on you and address any questions or concerns that you may have regarding the information given to you following your procedure. If we do not reach you, we will leave a message.  We will attempt to reach you two times.  During this call, we will ask if you have developed any symptoms of COVID 19. If you develop any symptoms (ie: fever, flu-like symptoms, shortness of breath, cough etc.) before then, please call [phone].  If you test positive for Covid 19 in the 2 weeks post procedure, please call and report this information to [place].    If any biopsies were taken you will be contacted by phone or by letter within the next 1-3 weeks.  Please call us at [phone] if you have not heard about the biopsies in 3 weeks.    SIGNATURES/CONFIDENTIALITY: You and/or your care partner have signed paperwork which will be entered into your electronic medical record.  These signatures attest to the fact that that the information above on your After Visit Summary has been reviewed and is understood.  Full responsibility of the confidentiality of this discharge information lies with you and/or your care-partner.

## 2021-06-21 ENCOUNTER — Telehealth: Payer: Self-pay | Admitting: *Deleted

## 2021-06-21 NOTE — Telephone Encounter (Signed)
Attempted f/u phone call. No answer. Left message. °

## 2021-06-21 NOTE — Telephone Encounter (Signed)
  Follow up Call-  Call back number 06/17/2021 02/19/2019  Post procedure Call Back phone  # [phone]  Permission to leave phone message Yes Yes  Some recent data might be hidden     Patient questions:  Do you have a fever, pain , or abdominal swelling? No. Pain Score  0 *  Have you tolerated food without any problems? Yes.    Have you been able to return to your normal activities? Yes.    Do you have any questions about your discharge instructions: Diet   No. Medications  No. Follow up visit  Yes.    Do you have questions or concerns about your Care? No.  Actions: * If pain score is 4 or above: No action needed, pain <4.  Pt. Said she was to call to make a 6 weeks follow up appointment,had question on how to do that number provided for pt. And she verbalize understanding.  Have you developed a fever since your procedure? no  2.   Have you had an respiratory symptoms (SOB or cough) since your procedure? no  3.   Have you tested positive for COVID 19 since your procedure no  4.   Have you had any family members/close contacts diagnosed with the COVID 19 since your procedure?  no   If yes to any of these questions please route to Joylene John, RN and Joella Prince, RN

## 2021-06-23 ENCOUNTER — Ambulatory Visit (HOSPITAL_COMMUNITY): Payer: 59

## 2021-06-29 ENCOUNTER — Encounter: Payer: Self-pay | Admitting: Gastroenterology

## 2021-06-30 ENCOUNTER — Encounter: Payer: Self-pay | Admitting: Family Medicine

## 2021-07-01 ENCOUNTER — Other Ambulatory Visit (HOSPITAL_COMMUNITY): Payer: Self-pay

## 2021-07-01 ENCOUNTER — Other Ambulatory Visit: Payer: Self-pay | Admitting: Family Medicine

## 2021-07-01 MED ORDER — SEMAGLUTIDE (1 MG/DOSE) 4 MG/3ML ~~LOC~~ SOPN
1.0000 mg | PEN_INJECTOR | SUBCUTANEOUS | 0 refills | Status: DC
  Filled 2021-07-01 – 2021-07-04 (×2): qty 3, 28d supply, fill #0

## 2021-07-01 MED ORDER — SEMAGLUTIDE (2 MG/DOSE) 8 MG/3ML ~~LOC~~ SOPN
2.0000 mg | PEN_INJECTOR | SUBCUTANEOUS | 5 refills | Status: DC
  Filled 2021-07-01: qty 3, fill #0

## 2021-07-01 MED ORDER — OZEMPIC (0.25 OR 0.5 MG/DOSE) 2 MG/1.5ML ~~LOC~~ SOPN
0.5000 mg | PEN_INJECTOR | SUBCUTANEOUS | 0 refills | Status: AC
  Filled 2021-07-01: qty 1.5, 28d supply, fill #0

## 2021-07-04 ENCOUNTER — Other Ambulatory Visit (HOSPITAL_COMMUNITY): Payer: Self-pay

## 2021-07-06 ENCOUNTER — Ambulatory Visit (HOSPITAL_COMMUNITY): Payer: 59

## 2021-07-06 ENCOUNTER — Encounter (HOSPITAL_COMMUNITY): Payer: Self-pay

## 2021-07-20 ENCOUNTER — Other Ambulatory Visit: Payer: Self-pay | Admitting: Cardiovascular Disease

## 2021-07-20 ENCOUNTER — Other Ambulatory Visit (HOSPITAL_COMMUNITY): Payer: Self-pay

## 2021-07-20 ENCOUNTER — Other Ambulatory Visit (HOSPITAL_BASED_OUTPATIENT_CLINIC_OR_DEPARTMENT_OTHER): Payer: Self-pay

## 2021-07-20 ENCOUNTER — Other Ambulatory Visit: Payer: Self-pay | Admitting: Family Medicine

## 2021-07-20 MED ORDER — METOPROLOL SUCCINATE ER 100 MG PO TB24
ORAL_TABLET | ORAL | 2 refills | Status: DC
  Filled 2021-07-20 (×2): qty 180, 90d supply, fill #0
  Filled 2021-12-08: qty 180, 90d supply, fill #1
  Filled 2021-12-12: qty 180, 90d supply, fill #0

## 2021-07-20 MED FILL — Temazepam Cap 30 MG: ORAL | 30 days supply | Qty: 30 | Fill #4 | Status: AC

## 2021-08-02 ENCOUNTER — Other Ambulatory Visit: Payer: Self-pay

## 2021-08-02 ENCOUNTER — Encounter: Payer: Self-pay | Admitting: Family Medicine

## 2021-08-02 ENCOUNTER — Other Ambulatory Visit (HOSPITAL_COMMUNITY): Payer: Self-pay

## 2021-08-02 ENCOUNTER — Ambulatory Visit: Payer: 59 | Admitting: Family Medicine

## 2021-08-02 DIAGNOSIS — K5900 Constipation, unspecified: Secondary | ICD-10-CM | POA: Diagnosis not present

## 2021-08-02 DIAGNOSIS — F418 Other specified anxiety disorders: Secondary | ICD-10-CM | POA: Diagnosis not present

## 2021-08-02 DIAGNOSIS — G4700 Insomnia, unspecified: Secondary | ICD-10-CM

## 2021-08-02 DIAGNOSIS — E559 Vitamin D deficiency, unspecified: Secondary | ICD-10-CM | POA: Diagnosis not present

## 2021-08-02 DIAGNOSIS — R739 Hyperglycemia, unspecified: Secondary | ICD-10-CM

## 2021-08-02 DIAGNOSIS — E782 Mixed hyperlipidemia: Secondary | ICD-10-CM | POA: Diagnosis not present

## 2021-08-02 MED ORDER — TRAZODONE HCL 100 MG PO TABS
200.0000 mg | ORAL_TABLET | Freq: Every day | ORAL | 1 refills | Status: DC
  Filled 2021-08-02: qty 180, 90d supply, fill #0
  Filled 2021-10-31: qty 180, 90d supply, fill #1

## 2021-08-02 NOTE — Progress Notes (Signed)
Patient ID: [name] Jensen, female    DOB: [birth year]  Age: 65 y.o. MRN: [id number]    Subjective:  Subjective  HPI [name] Jensen presents for office visit today for follow up on type 2 diabetes and GERD. She reports that recently she has been waking up in the middle of the night with heart racing/palpitations, nausea, and breaking out in sweats. She has also been experiencing sleep issues. She expresses interest increasing her trazodone 150 mg to see if it can improve her symptoms. She states that she has not been taking her Zofran due to the side effect of constipation and alternatively sniffs on alcohol wipes which had helped improve her nausea symptoms. She takes naps after work which might contribute to her sleep issues. Denies CP/SOB/HA/congestion/fevers/GI or GU c/o. Taking meds as prescribed.  Has been tolerating her Semaglutide injections. Her blood sugar readings have highs of 130 and averages around 110's.   Review of Systems  Constitutional:  Positive for chills and diaphoresis. Negative for fatigue and fever.  HENT:  Negative for congestion, rhinorrhea, sinus pressure, sinus pain and sore throat.   Eyes:  Negative for pain.  Respiratory:  Negative for cough and shortness of breath.   Cardiovascular:  Positive for palpitations. Negative for chest pain and leg swelling.  Gastrointestinal:  Positive for nausea. Negative for abdominal pain, blood in stool, diarrhea and vomiting.  Genitourinary:  Negative for decreased urine volume, flank pain, frequency, vaginal bleeding and vaginal discharge.  Musculoskeletal:  Negative for back pain.  Neurological:  Negative for headaches.  Psychiatric/Behavioral:  Positive for sleep disturbance.    History Past Medical History:  Diagnosis Date   Adjustment reaction with anxiety and depression 06/12/2014   Anemia    h/o low ferritin   Anxiety    Asthma    environmental triggers   Chicken pox as a child   Cough 08/22/2015    Depression    Depression with anxiety 06/12/2014   Diverticulosis    Ehrlichiosis 7829   Gallstones    GERD (gastroesophageal reflux disease)    Hair loss 03/28/2017   Hiatal hernia 2007   History of viral illness    Hyperlipidemia, mixed 04/11/2015   Insomnia 08/21/2017   Lactose intolerance 06/12/2014   Mumps as a child   Overweight 08/19/2014   Rectocele 06/12/2014   RLS (restless legs syndrome)    SCC (squamous cell carcinoma) 06/12/2014   Right arm removed 2011   Sessile colonic polyp    SVT (supraventricular tachycardia) ([place])    Tachycardia 06/12/2014   Thyroid disease    Vitamin D deficiency 08/22/2015    She has a past surgical history that includes Cholecystectomy; Appendectomy; Tonsillectomy (1972); Wisdom tooth extraction (65 yrs old); Abdominal hysterectomy (11-2008); Skin surgery; Colonoscopy (2006); Esophagogastroduodenoscopy (2006); svt ablation; Incontinence surgery; and Esophagogastroduodenoscopy (N/A, 02/21/2018).   Her family history includes Atrial fibrillation in her maternal grandmother; Diabetes in her paternal grandmother and sister; Fibromyalgia in her sister; GI Bleed in her maternal grandmother; Heart disease in her maternal grandfather; Hypertension in her father, mother, and sister; Proteinuria in her sister; Stroke in her paternal grandfather.She reports that she has never smoked. She has never used smokeless tobacco. She reports that she does not drink alcohol and does not use drugs.  Current Outpatient Medications on File Prior to Visit  Medication Sig Dispense Refill   atorvastatin (LIPITOR) 10 MG tablet TAKE 1 TABLET BY MOUTH DAILY 90 tablet 1   blood glucose meter kit and supplies  KIT Dispense based on patient and insurance preference. Use up to four times daily as directed. (FOR ICD-9 250.00, 250.01). 1 each 0   Blood Glucose Monitoring Suppl (FREESTYLE LITE) w/Device KIT USE TO CHECK BLOOD SUGAR UP TO 4 TIMES DAILY 1 kit 0   Cholecalciferol (VITAMIN D3)  5000 units CAPS Take 5,000 Units by mouth daily.      colesevelam (WELCHOL) 625 MG tablet TAKE 3 TABLETS BY MOUTH TWICE DAILY WITH MEALS 180 tablet 0   dicyclomine (BENTYL) 10 MG capsule Take 1 capsule (10 mg total) by mouth 3 (three) times daily before meals. 270 capsule 3   esomeprazole (NEXIUM) 40 MG capsule Take 1 capsule (40 mg total) by mouth 2 (two) times daily before a meal. 60 capsule 11   famotidine (PEPCID) 40 MG tablet TAKE 1 TABLET BY MOUTH 2 TIMES DAILY 60 tablet 5   glucose blood test strip USE TO CHECK BLOOD SUGAR 4 TIMES A DAY (Patient taking differently: USE TO CHECK BLOOD SUGAR 4 TIMES A DAY) 100 strip 0   Hyoscyamine Sulfate SL 0.125 MG SUBL PLACE 1 TABLET UNDER THE TONGUE EVERY 4 HOURS AS NEEDED. 40 tablet 2   Lancets (FREESTYLE) lancets USE TO CHECK BLOOD SUGAR 4 TIMES A DAY (Patient taking differently: USE TO CHECK BLOOD SUGAR 4 TIMES A DAY) 100 each 0   metoprolol succinate (TOPROL-XL) 100 MG 24 hr tablet TAKE 1 TABLET BY MOUTH TWICE A DAY, TAKE WITH OR IMMEDIATELY FOLLOWING A MEAL. 180 tablet 2   ondansetron (ZOFRAN) 4 MG tablet Take 1 tablet (4 mg total) by mouth every 8 (eight) hours as needed for nausea or vomiting. 40 tablet 2   Semaglutide, 2 MG/DOSE, 8 MG/3ML SOPN Inject 2 mg as directed once a week. 3 mL 5   temazepam (RESTORIL) 30 MG capsule TAKE 1 CAPSULE BY MOUTH ONCE DAILY AT BEDTIME 30 capsule 5   No current facility-administered medications on file prior to visit.     Objective:  Objective  Physical Exam Constitutional:      General: She is not in acute distress.    Appearance: Normal appearance. She is not ill-appearing or toxic-appearing.  HENT:     Head: Normocephalic and atraumatic.     Right Ear: Tympanic membrane, ear canal and external ear normal.     Left Ear: Tympanic membrane, ear canal and external ear normal.     Nose: No congestion or rhinorrhea.  Eyes:     Extraocular Movements: Extraocular movements intact.     Pupils: Pupils are  equal, round, and reactive to light.  Cardiovascular:     Rate and Rhythm: Normal rate and regular rhythm.     Pulses: Normal pulses.     Heart sounds: Normal heart sounds. No murmur heard. Pulmonary:     Effort: Pulmonary effort is normal. No respiratory distress.     Breath sounds: Normal breath sounds. No wheezing, rhonchi or rales.  Abdominal:     General: Bowel sounds are normal.     Palpations: Abdomen is soft. There is no mass.     Tenderness: no abdominal tenderness There is no guarding.     Hernia: No hernia is present.  Musculoskeletal:        General: Normal range of motion.     Cervical back: Normal range of motion and neck supple.  Skin:    General: Skin is warm and dry.  Neurological:     Mental Status: She is alert and oriented to person,  place, and time.  Psychiatric:        Behavior: Behavior normal.   BP 128/84   Pulse 81   Temp 98.4 F (36.9 C)   Resp 16   Wt 184 lb (83.5 kg)   LMP  (LMP Unknown)   SpO2 98%   BMI 31.58 kg/m  Wt Readings from Last 3 Encounters:  08/02/21 184 lb (83.5 kg)  06/17/21 188 lb (85.3 kg)  06/15/21 188 lb 6.4 oz (85.5 kg)     Lab Results  Component Value Date   WBC 7.1 05/03/2021   HGB 13.9 05/03/2021   HCT 40.0 05/03/2021   PLT 312.0 05/03/2021   GLUCOSE 108 (H) 05/03/2021   CHOL 142 05/03/2021   TRIG 146.0 05/03/2021   HDL 49.80 05/03/2021   LDLDIRECT 72.0 04/26/2020   LDLCALC 63 05/03/2021   ALT 26 05/03/2021   AST 19 05/03/2021   NA 139 05/03/2021   K 3.9 05/03/2021   CL 102 05/03/2021   CREATININE 0.81 05/03/2021   BUN 16 05/03/2021   CO2 27 05/03/2021   TSH 1.78 05/03/2021   INR 1.0 11/09/2008   HGBA1C 5.9 05/03/2021    MM 3D SCREEN BREAST BILATERAL  Result Date: 11/25/2020 CLINICAL DATA:  Screening. EXAM: DIGITAL SCREENING BILATERAL MAMMOGRAM WITH TOMO AND CAD COMPARISON:  Previous exam(s). ACR Breast Density Category b: There are scattered areas of fibroglandular density. FINDINGS: There are no  findings suspicious for malignancy. Images were processed with CAD. IMPRESSION: No mammographic evidence of malignancy. A result letter of this screening mammogram will be mailed directly to the patient. RECOMMENDATION: Screening mammogram in one year. (Code:[id number]-B-01Y) BI-RADS CATEGORY  1: Negative. Electronically Signed   By: Lajean Manes M.D.   On: 11/25/2020 09:50     Assessment & Plan:  Plan    Meds ordered this encounter  Medications   traZODone (DESYREL) 100 MG tablet    Sig: Take 2 tablets (200 mg total) by mouth at bedtime.    Dispense:  180 tablet    Refill:  1     Problem List Items Addressed This Visit     Depression with anxiety    Continues to struggle with stress at work and in life but manages with her current meds will let us know if she is willing to consider changing meds      Relevant Medications   traZODone (DESYREL) 100 MG tablet   Constipation    Encouraged increased hydration and fiber in diet. Daily probiotics. If bowels not moving can use MOM 2 tbls po in 4 oz of warm prune juice by mouth every 2-3 days. If no results then repeat in 4 hours with  Dulcolax suppository pr, may repeat again in 4 more hours as needed. Seek care if symptoms worsen. Consider daily Miralax and/or Dulcolax if symptoms persist.       Hyperlipidemia, mixed    Encourage heart healthy diet such as MIND or DASH diet, increase exercise, avoid trans fats, simple carbohydrates and processed foods, consider a krill or fish or flaxseed oil cap daily. Tolerating Atorvastatin      Hyperglycemia    hgba1c acceptable, minimize simple carbs. Increase exercise as tolerated.       Vitamin D deficiency    Supplement and monitor      Insomnia    Encouraged good sleep hygiene such as dark, quiet room. No blue/green glowing lights such as computer screens in bedroom. No alcohol or stimulants in evening. Cut down on caffeine  as able. Regular exercise is helpful but not just prior to bed time.  Increase Trazodone to 200 mg qhs       Follow-up: Return in about 4 months (around 12/02/2021).  I, Suezanne Jacquet, acting as a scribe for Penni Homans, MD, have documented all relevent documentation on behalf of Penni Homans, MD, as directed by Penni Homans, MD while in the presence of Penni Homans, MD.  I, [name], MD personally performed the services described in this documentation. All medical record entries made by the scribe were at my direction and in my presence. I have reviewed the chart and agree that the record reflects my personal performance and is accurate and complete

## 2021-08-02 NOTE — Patient Instructions (Signed)
  Paxlovid is the new COVID medication we can give you if you get COVID so make sure you test if you have symptoms because we have to treat by day 5 of symptoms for it to be effective. If you are positive let us know so we can treat. If a home test is negative and your symptoms are persistent get a PCR test. Can check testing locations at [email or web address] If you are positive we will make an appointment with [place] and we will send in Paxlovid if you would like it. Check with your pharmacy before we meet to confirm they have it in stock, if they do not then we can get the prescription at the [facility]

## 2021-08-05 ENCOUNTER — Other Ambulatory Visit: Payer: Self-pay | Admitting: Family Medicine

## 2021-08-05 ENCOUNTER — Other Ambulatory Visit (HOSPITAL_COMMUNITY): Payer: Self-pay

## 2021-08-05 MED ORDER — TIRZEPATIDE 2.5 MG/0.5ML ~~LOC~~ SOAJ
2.5000 mg | SUBCUTANEOUS | 0 refills | Status: DC
  Filled 2021-08-05: qty 2, 28d supply, fill #0

## 2021-08-08 NOTE — Assessment & Plan Note (Signed)
hgba1c acceptable, minimize simple carbs. Increase exercise as tolerated.  

## 2021-08-08 NOTE — Assessment & Plan Note (Signed)
Encouraged good sleep hygiene such as dark, quiet room. No blue/green glowing lights such as computer screens in bedroom. No alcohol or stimulants in evening. Cut down on caffeine as able. Regular exercise is helpful but not just prior to bed time. Increase Trazodone to 200 mg qhs

## 2021-08-08 NOTE — Assessment & Plan Note (Signed)
Encouraged increased hydration and fiber in diet. Daily probiotics. If bowels not moving can use MOM 2 tbls po in 4 oz of warm prune juice by mouth every 2-3 days. If no results then repeat in 4 hours with  Dulcolax suppository pr, may repeat again in 4 more hours as needed. Seek care if symptoms worsen. Consider daily Miralax and/or Dulcolax if symptoms persist.  

## 2021-08-08 NOTE — Assessment & Plan Note (Signed)
Encourage heart healthy diet such as MIND or DASH diet, increase exercise, avoid trans fats, simple carbohydrates and processed foods, consider a krill or fish or flaxseed oil cap daily. Tolerating Atorvastatin 

## 2021-08-08 NOTE — Assessment & Plan Note (Signed)
Continues to struggle with stress at work and in life but manages with her current meds will let us know if she is willing to consider changing meds

## 2021-08-08 NOTE — Assessment & Plan Note (Signed)
Supplement and monitor 

## 2021-08-11 ENCOUNTER — Other Ambulatory Visit (HOSPITAL_COMMUNITY): Payer: Self-pay

## 2021-08-11 ENCOUNTER — Ambulatory Visit: Payer: 59 | Admitting: Gastroenterology

## 2021-08-11 ENCOUNTER — Encounter: Payer: Self-pay | Admitting: Gastroenterology

## 2021-08-11 VITALS — BP 122/74 | HR 88 | Ht 64.0 in | Wt 183.4 lb

## 2021-08-11 DIAGNOSIS — K2100 Gastro-esophageal reflux disease with esophagitis, without bleeding: Secondary | ICD-10-CM

## 2021-08-11 DIAGNOSIS — K580 Irritable bowel syndrome with diarrhea: Secondary | ICD-10-CM | POA: Diagnosis not present

## 2021-08-11 MED ORDER — GLYCOPYRROLATE 2 MG PO TABS
2.0000 mg | ORAL_TABLET | Freq: Two times a day (BID) | ORAL | 11 refills | Status: DC
  Filled 2021-08-11: qty 60, 30d supply, fill #0

## 2021-08-11 NOTE — Progress Notes (Signed)
    History of Present Illness: This is a 65 year old female returning for follow-up of lower abdominal pain and urgent postprandial diarrhea.  She has noticed occasional constipation with WelChol so she has appropriately adjusted her dose.  She does not feel dicyclomine has been helpful.  She notes an increase in intestinal gas.  GES scheduled for 9/12.  Her reflux symptoms are under much better control on Nexium twice daily and closely following antireflux measures.  EGD 06/2021 - LA Grade B reflux esophagitis with no bleeding. - Erythematous mucosa in the stomach. Biopsied. - Medium-sized hiatal hernia. - Normal duodenal bulb and second portion of the duodenum.   Current Medications, Allergies, Past Medical History, Past Surgical History, Family History and Social History were reviewed in [employer] record.   Physical Exam: General: Well developed, well nourished, no acute distress Head: Normocephalic and atraumatic Eyes: Sclerae anicteric, EOMI Ears: Normal auditory acuity Mouth: Not examined, mask on during Covid-19 pandemic Lungs: Clear throughout to auscultation Heart: Regular rate and rhythm; no murmurs, rubs or bruits Abdomen: Soft, non tender and non distended. No masses, hepatosplenomegaly or hernias noted. Normal Bowel sounds Rectal: Deferred Musculoskeletal: Symmetrical with no gross deformities  Pulses:  Normal pulses noted Extremities: No clubbing, cyanosis, edema or deformities noted Neurological: Alert oriented x 4, grossly nonfocal Psychological:  Alert and cooperative. Normal mood and affect   Assessment and Recommendations:  GERD with LA class B esophagitis and a medium sized HH.  Closely follow antireflux measures.  Continue Nexium 40 mg p.o. twice daily.  When problem #2 is under much better control consider reducing Nexium to once daily. IBS-D. Low FODMAP, low gas diet. Philips colon health qd for 2 weeks then Florastor bid for 2 weeks.   If either probiotic is helpful in controlling's symptoms she should remain on it.  Change to glycopyrrolate 2 mg po bid. Change hyoscyamine to 0.125 mg po 1-2 before meals. Gas-X tid. Continue Welchol 1/2 to 2 po bid prn diarrhea. Discontinue dicyclomine.  Call in about 2 weeks if symptoms not improving on the above regimen.  REV in 4 weeks.

## 2021-08-11 NOTE — Patient Instructions (Addendum)
Discontinue dicyclomine.  We have sent the following medications to your pharmacy for you to pick up at your convenience: glycopyrrolate.   Take your hyoscyamine 1 tablet before meals.  Please take over the counter Gas-X three times a day for gas and bloating.   We have given you a gas prevention diet to follow. Along with a low Fodmap diet to follow.    Start taking a probiotic such as Hardin Negus colon health x 2 weeks. If no improvement then stop. Then start another probiotic such as Florastor x 2 weeks.  The [facility] providers would like to encourage you to use [facility] to communicate with providers for non-urgent requests or questions.  Due to long hold times on the telephone, sending your provider a message by [facility] may be a faster and more efficient way to get a response.  Please allow 48 business hours for a response.  Please remember that this is for non-urgent requests.   Due to recent changes in healthcare laws, you may see the results of your imaging and laboratory studies on MyChart before your provider has had a chance to review them.  We understand that in some cases there may be results that are confusing or concerning to you. Not all laboratory results come back in the same time frame and the provider may be waiting for multiple results in order to interpret others.  Please give [place] 48 hours in order for your provider to thoroughly review all the results before contacting the office for clarification of your results.   Thank you for choosing me and [facility].  Pricilla Riffle. Dagoberto Ligas., MD., Marval Regal

## 2021-08-15 ENCOUNTER — Encounter (HOSPITAL_COMMUNITY)
Admission: RE | Admit: 2021-08-15 | Discharge: 2021-08-15 | Disposition: A | Discharge status: Home / Self Care | Payer: 59 | Source: Ambulatory Visit | Attending: Gastroenterology | Admitting: Gastroenterology

## 2021-08-15 ENCOUNTER — Other Ambulatory Visit: Payer: Self-pay

## 2021-08-15 DIAGNOSIS — K580 Irritable bowel syndrome with diarrhea: Secondary | ICD-10-CM | POA: Insufficient documentation

## 2021-08-15 DIAGNOSIS — R112 Nausea with vomiting, unspecified: Secondary | ICD-10-CM | POA: Diagnosis not present

## 2021-08-15 DIAGNOSIS — K2100 Gastro-esophageal reflux disease with esophagitis, without bleeding: Secondary | ICD-10-CM | POA: Diagnosis not present

## 2021-08-15 DIAGNOSIS — K30 Functional dyspepsia: Secondary | ICD-10-CM | POA: Diagnosis not present

## 2021-08-15 DIAGNOSIS — K3184 Gastroparesis: Secondary | ICD-10-CM | POA: Diagnosis not present

## 2021-08-15 MED ORDER — TECHNETIUM TC 99M SULFUR COLLOID
2.0000 | Freq: Once | INTRAVENOUS | Status: AC | PRN
  Administered 2021-08-15: 2.2 via INTRAVENOUS

## 2021-08-17 ENCOUNTER — Other Ambulatory Visit: Payer: Self-pay | Admitting: Family Medicine

## 2021-08-17 ENCOUNTER — Encounter: Payer: Self-pay | Admitting: Family Medicine

## 2021-08-17 ENCOUNTER — Other Ambulatory Visit: Payer: Self-pay

## 2021-08-17 ENCOUNTER — Other Ambulatory Visit (HOSPITAL_COMMUNITY)
Admission: RE | Admit: 2021-08-17 | Discharge: 2021-08-17 | Disposition: A | Discharge status: Home / Self Care | Payer: 59 | Source: Ambulatory Visit | Attending: Family Medicine | Admitting: Family Medicine

## 2021-08-17 ENCOUNTER — Telehealth: Payer: Self-pay

## 2021-08-17 DIAGNOSIS — R739 Hyperglycemia, unspecified: Secondary | ICD-10-CM | POA: Diagnosis not present

## 2021-08-17 DIAGNOSIS — E559 Vitamin D deficiency, unspecified: Secondary | ICD-10-CM

## 2021-08-17 DIAGNOSIS — E2839 Other primary ovarian failure: Secondary | ICD-10-CM | POA: Insufficient documentation

## 2021-08-17 DIAGNOSIS — Z780 Asymptomatic menopausal state: Secondary | ICD-10-CM

## 2021-08-17 DIAGNOSIS — E782 Mixed hyperlipidemia: Secondary | ICD-10-CM

## 2021-08-17 LAB — HEMOGLOBIN A1C
Hgb A1c MFr Bld: 5.5 % (ref 4.8–5.6)
Mean Plasma Glucose: 111.15 mg/dL

## 2021-08-17 LAB — CBC WITH DIFFERENTIAL/PLATELET
Abs Immature Granulocytes: 0.01 10*3/uL (ref 0.00–0.07)
Basophils Absolute: 0 10*3/uL (ref 0.0–0.1)
Basophils Relative: 1 %
Eosinophils Absolute: 0.1 10*3/uL (ref 0.0–0.5)
Eosinophils Relative: 1 %
HCT: 41.9 % (ref 36.0–46.0)
Hemoglobin: 13.9 g/dL (ref 12.0–15.0)
Immature Granulocytes: 0 %
Lymphocytes Relative: 39 %
Lymphs Abs: 2.5 10*3/uL (ref 0.7–4.0)
MCH: 28.4 pg (ref 26.0–34.0)
MCHC: 33.2 g/dL (ref 30.0–36.0)
MCV: 85.7 fL (ref 80.0–100.0)
Monocytes Absolute: 0.4 10*3/uL (ref 0.1–1.0)
Monocytes Relative: 7 %
Neutro Abs: 3.4 10*3/uL (ref 1.7–7.7)
Neutrophils Relative %: 52 %
Platelets: 291 10*3/uL (ref 150–400)
RBC: 4.89 MIL/uL (ref 3.87–5.11)
RDW: 12.5 % (ref 11.5–15.5)
WBC: 6.4 10*3/uL (ref 4.0–10.5)
nRBC: 0 % (ref 0.0–0.2)

## 2021-08-17 LAB — COMPREHENSIVE METABOLIC PANEL
ALT: 22 U/L (ref 0–44)
AST: 20 U/L (ref 15–41)
Albumin: 4.5 g/dL (ref 3.5–5.0)
Alkaline Phosphatase: 77 U/L (ref 38–126)
Anion gap: 7 (ref 5–15)
BUN: 18 mg/dL (ref 8–23)
CO2: 26 mmol/L (ref 22–32)
Calcium: 9.1 mg/dL (ref 8.9–10.3)
Chloride: 103 mmol/L (ref 98–111)
Creatinine, Ser: 0.88 mg/dL (ref 0.44–1.00)
GFR, Estimated: >60 mL/min (ref >60)
Glucose, Bld: 110 mg/dL — ABNORMAL HIGH (ref 70–99)
Potassium: 4.1 mmol/L (ref 3.5–5.1)
Sodium: 136 mmol/L (ref 135–145)
Total Bilirubin: 0.7 mg/dL (ref 0.3–1.2)
Total Protein: 7.7 g/dL (ref 6.5–8.1)

## 2021-08-17 LAB — TSH: TSH: 1.88 u[IU]/mL (ref 0.350–4.500)

## 2021-08-17 LAB — LIPID PANEL
Cholesterol: 175 mg/dL (ref 0–200)
HDL: 46 mg/dL (ref >40)
LDL Cholesterol: 93 mg/dL (ref 0–99)
Total CHOL/HDL Ratio: 3.8 RATIO
Triglycerides: 182 mg/dL — ABNORMAL HIGH (ref <150)
VLDL: 36 mg/dL (ref 0–40)

## 2021-08-17 NOTE — Telephone Encounter (Signed)
Sent/done 

## 2021-08-17 NOTE — Telephone Encounter (Signed)
Spoke with pt and need labs put in to be drawn at [facility]. I didn't see order in so I put in order repeat orders from 05/03/21. Also spoke with lab rep from [facility] and they wanted orders faxed to [facility]. done

## 2021-08-18 ENCOUNTER — Other Ambulatory Visit (HOSPITAL_COMMUNITY): Payer: Self-pay

## 2021-08-18 LAB — INSULIN, RANDOM: Insulin: 42.8 u[IU]/mL — ABNORMAL HIGH (ref 2.6–24.9)

## 2021-08-18 MED ORDER — TEMAZEPAM 30 MG PO CAPS
ORAL_CAPSULE | Freq: Every day | ORAL | 5 refills | Status: DC
  Filled 2021-08-18: qty 30, 30d supply, fill #0
  Filled 2021-09-26: qty 30, 30d supply, fill #1
  Filled 2021-10-31: qty 30, 30d supply, fill #2
  Filled 2021-12-08: qty 30, 30d supply, fill #3
  Filled 2022-01-08: qty 30, 30d supply, fill #4
  Filled 2022-01-25 – 2022-02-08 (×2): qty 30, 30d supply, fill #5

## 2021-08-18 NOTE — Telephone Encounter (Signed)
Requesting: temazepam Contract: 09/14/20 UDS: 09/14/20 Last Visit: 08/02/21 Next Visit: 11/07/21 Last Refill: 02/03/21  Please Advise

## 2021-08-25 LAB — VITAMIN D 1,25 DIHYDROXY
Vitamin D 1, 25 (OH)2 Total: 53 pg/mL
Vitamin D2 1, 25 (OH)2: <10 pg/mL
Vitamin D3 1, 25 (OH)2: 53 pg/mL

## 2021-08-31 ENCOUNTER — Encounter: Payer: Self-pay | Admitting: Family Medicine

## 2021-08-31 ENCOUNTER — Other Ambulatory Visit: Payer: Self-pay | Admitting: Family Medicine

## 2021-08-31 MED ORDER — TIRZEPATIDE 7.5 MG/0.5ML ~~LOC~~ SOAJ
7.5000 mg | SUBCUTANEOUS | 0 refills | Status: DC
  Filled 2021-08-31 – 2021-09-26 (×2): qty 6, 84d supply, fill #0

## 2021-08-31 MED ORDER — TIRZEPATIDE 5 MG/0.5ML ~~LOC~~ SOAJ
5.0000 mg | SUBCUTANEOUS | 0 refills | Status: DC
  Filled 2021-08-31: qty 2, 28d supply, fill #0

## 2021-09-01 ENCOUNTER — Other Ambulatory Visit (HOSPITAL_COMMUNITY): Payer: Self-pay

## 2021-09-13 ENCOUNTER — Ambulatory Visit: Payer: 59 | Admitting: Gastroenterology

## 2021-09-13 ENCOUNTER — Encounter: Payer: Self-pay | Admitting: Gastroenterology

## 2021-09-13 VITALS — BP 104/76 | HR 90 | Ht 64.0 in | Wt 171.0 lb

## 2021-09-13 DIAGNOSIS — K3184 Gastroparesis: Secondary | ICD-10-CM | POA: Diagnosis not present

## 2021-09-13 DIAGNOSIS — K2100 Gastro-esophageal reflux disease with esophagitis, without bleeding: Secondary | ICD-10-CM

## 2021-09-13 NOTE — Patient Instructions (Signed)
Decrease the amount of fats you're eating in foods. Such as red meats and salmon.   Call before your follow up appointment if your symptoms are not better and we can discuss medications.  Due to recent changes in healthcare laws, you may see the results of your imaging and laboratory studies on MyChart before your provider has had a chance to review them.  We understand that in some cases there may be results that are confusing or concerning to you. Not all laboratory results come back in the same time frame and the provider may be waiting for multiple results in order to interpret others.  Please give [place] 48 hours in order for your provider to thoroughly review all the results before contacting the office for clarification of your results.   The [facility] providers would like to encourage you to use [facility] to communicate with providers for non-urgent requests or questions.  Due to long hold times on the telephone, sending your provider a message by [facility] may be a faster and more efficient way to get a response.  Please allow 48 business hours for a response.  Please remember that this is for non-urgent requests.   Thank you for choosing me and [facility].  Pricilla Riffle. Dagoberto Ligas., MD., Marval Regal

## 2021-09-13 NOTE — Progress Notes (Signed)
    History of Present Illness: This is a 65 year old female with reflux esophagitis and gastroparesis.  She relates episodes of nausea and vomiting and belching several hours following meals.  The most recent times have occurred with low-fat ground beef and salmon.  She is no longer having diarrhea.  She discontinued glycopyrrolate due to a dry mouth.  Current Medications, Allergies, Past Medical History, Past Surgical History, Family History and Social History were reviewed in [employer] record.   Physical Exam: General: Well developed, well nourished, no acute distress Head: Normocephalic and atraumatic Eyes: Sclerae anicteric, EOMI Ears: Normal auditory acuity Mouth: Not examined, mask on during Covid-19 pandemic Lungs: Clear throughout to auscultation Heart: Regular rate and rhythm; no murmurs, rubs or bruits Abdomen: Soft, non tender and non distended. No masses, hepatosplenomegaly or hernias noted. Normal Bowel sounds Rectal: Not done Musculoskeletal: Symmetrical with no gross deformities  Pulses:  Normal pulses noted Extremities: No clubbing, cyanosis, edema or deformities noted Neurological: Alert oriented x 4, grossly nonfocal Psychological:  Alert and cooperative. Normal mood and affect   Assessment and Recommendations:  GERD with LA class B esophagitis and a moderate sized hiatal hernia.  Closely follow antireflux measures and continue Nexium 40 mg p.o. twice daily. Gastroparesis. GES showed 75% emptied at 4 hours with normal greater than 90%.  Closely follow gastroparesis diet.  At this point would limit meat and fish intake to chicken and white fish.  Discussed a trial of metoclopramide before meals and at bedtime or just at bedtime.  We discussed the long-term potential side effects of metoclopramide.  She would like to wait on use of metoclopramide until she has made further dietary adjustments which is certainly reasonable. REV in 2 months.  IBS-D.  Much improved.  Glycopyrrolate was discontinued.  Minimize use of dicyclomine and hyoscyamine given gastroparesis.

## 2021-09-26 ENCOUNTER — Other Ambulatory Visit (HOSPITAL_COMMUNITY): Payer: Self-pay

## 2021-09-27 ENCOUNTER — Other Ambulatory Visit (HOSPITAL_COMMUNITY): Payer: Self-pay

## 2021-09-28 ENCOUNTER — Other Ambulatory Visit (HOSPITAL_COMMUNITY): Payer: Self-pay

## 2021-10-12 ENCOUNTER — Other Ambulatory Visit (HOSPITAL_COMMUNITY): Payer: Self-pay

## 2021-10-12 DIAGNOSIS — Z08 Encounter for follow-up examination after completed treatment for malignant neoplasm: Secondary | ICD-10-CM | POA: Diagnosis not present

## 2021-10-12 DIAGNOSIS — L821 Other seborrheic keratosis: Secondary | ICD-10-CM | POA: Diagnosis not present

## 2021-10-12 DIAGNOSIS — L298 Other pruritus: Secondary | ICD-10-CM | POA: Diagnosis not present

## 2021-10-12 DIAGNOSIS — L538 Other specified erythematous conditions: Secondary | ICD-10-CM | POA: Diagnosis not present

## 2021-10-12 DIAGNOSIS — L814 Other melanin hyperpigmentation: Secondary | ICD-10-CM | POA: Diagnosis not present

## 2021-10-12 DIAGNOSIS — L570 Actinic keratosis: Secondary | ICD-10-CM | POA: Diagnosis not present

## 2021-10-12 DIAGNOSIS — L708 Other acne: Secondary | ICD-10-CM | POA: Diagnosis not present

## 2021-10-12 DIAGNOSIS — L218 Other seborrheic dermatitis: Secondary | ICD-10-CM | POA: Diagnosis not present

## 2021-10-12 DIAGNOSIS — L700 Acne vulgaris: Secondary | ICD-10-CM | POA: Diagnosis not present

## 2021-10-12 DIAGNOSIS — L820 Inflamed seborrheic keratosis: Secondary | ICD-10-CM | POA: Diagnosis not present

## 2021-10-12 DIAGNOSIS — D225 Melanocytic nevi of trunk: Secondary | ICD-10-CM | POA: Diagnosis not present

## 2021-10-12 DIAGNOSIS — Z85828 Personal history of other malignant neoplasm of skin: Secondary | ICD-10-CM | POA: Diagnosis not present

## 2021-10-12 MED ORDER — RETIN-A MICRO PUMP 0.08 % EX GEL
CUTANEOUS | 3 refills | Status: DC
  Filled 2021-10-12: qty 50, 30d supply, fill #0

## 2021-10-13 ENCOUNTER — Other Ambulatory Visit (HOSPITAL_COMMUNITY): Payer: Self-pay

## 2021-10-14 ENCOUNTER — Other Ambulatory Visit (HOSPITAL_COMMUNITY): Payer: Self-pay

## 2021-10-15 ENCOUNTER — Other Ambulatory Visit (HOSPITAL_COMMUNITY): Payer: Self-pay

## 2021-10-17 ENCOUNTER — Other Ambulatory Visit (HOSPITAL_COMMUNITY): Payer: Self-pay

## 2021-10-17 MED ORDER — TRETINOIN 0.05 % EX CREA
TOPICAL_CREAM | CUTANEOUS | 3 refills | Status: DC
  Filled 2021-10-17: qty 45, 30d supply, fill #0
  Filled 2022-04-05: qty 45, 30d supply, fill #1
  Filled 2022-05-19: qty 45, 30d supply, fill #2
  Filled 2022-06-21: qty 45, 30d supply, fill #3

## 2021-10-18 ENCOUNTER — Other Ambulatory Visit (HOSPITAL_COMMUNITY): Payer: Self-pay

## 2021-10-19 DIAGNOSIS — H2513 Age-related nuclear cataract, bilateral: Secondary | ICD-10-CM | POA: Diagnosis not present

## 2021-10-19 DIAGNOSIS — E119 Type 2 diabetes mellitus without complications: Secondary | ICD-10-CM | POA: Diagnosis not present

## 2021-10-19 LAB — HM DIABETES EYE EXAM

## 2021-10-21 ENCOUNTER — Other Ambulatory Visit (HOSPITAL_COMMUNITY): Payer: Self-pay | Admitting: Family Medicine

## 2021-10-21 DIAGNOSIS — Z1231 Encounter for screening mammogram for malignant neoplasm of breast: Secondary | ICD-10-CM

## 2021-10-23 ENCOUNTER — Encounter: Payer: Self-pay | Admitting: Family Medicine

## 2021-10-24 ENCOUNTER — Other Ambulatory Visit: Payer: Self-pay

## 2021-10-24 ENCOUNTER — Other Ambulatory Visit (HOSPITAL_COMMUNITY): Payer: Self-pay

## 2021-10-24 MED ORDER — TIRZEPATIDE 10 MG/0.5ML ~~LOC~~ SOAJ
10.0000 mg | SUBCUTANEOUS | 1 refills | Status: DC
  Filled 2021-10-24: qty 6, 84d supply, fill #0

## 2021-10-25 ENCOUNTER — Other Ambulatory Visit (HOSPITAL_COMMUNITY): Payer: Self-pay

## 2021-10-26 ENCOUNTER — Other Ambulatory Visit (HOSPITAL_COMMUNITY): Payer: Self-pay

## 2021-10-31 ENCOUNTER — Other Ambulatory Visit (HOSPITAL_COMMUNITY): Payer: Self-pay

## 2021-11-01 ENCOUNTER — Other Ambulatory Visit (HOSPITAL_COMMUNITY): Payer: Self-pay

## 2021-11-07 ENCOUNTER — Encounter: Payer: Self-pay | Admitting: Family Medicine

## 2021-11-07 ENCOUNTER — Ambulatory Visit (INDEPENDENT_AMBULATORY_CARE_PROVIDER_SITE_OTHER): Payer: 59 | Admitting: Family Medicine

## 2021-11-07 VITALS — BP 102/64 | HR 84 | Temp 98.0°F | Resp 16 | Ht 64.0 in | Wt 156.4 lb

## 2021-11-07 DIAGNOSIS — R5383 Other fatigue: Secondary | ICD-10-CM

## 2021-11-07 DIAGNOSIS — D649 Anemia, unspecified: Secondary | ICD-10-CM | POA: Diagnosis not present

## 2021-11-07 DIAGNOSIS — R0609 Other forms of dyspnea: Secondary | ICD-10-CM

## 2021-11-07 DIAGNOSIS — R000 Tachycardia, unspecified: Secondary | ICD-10-CM

## 2021-11-07 DIAGNOSIS — R739 Hyperglycemia, unspecified: Secondary | ICD-10-CM

## 2021-11-07 DIAGNOSIS — G479 Sleep disorder, unspecified: Secondary | ICD-10-CM | POA: Diagnosis not present

## 2021-11-07 DIAGNOSIS — E8881 Metabolic syndrome: Secondary | ICD-10-CM | POA: Insufficient documentation

## 2021-11-07 DIAGNOSIS — E782 Mixed hyperlipidemia: Secondary | ICD-10-CM

## 2021-11-07 DIAGNOSIS — K5900 Constipation, unspecified: Secondary | ICD-10-CM | POA: Diagnosis not present

## 2021-11-07 DIAGNOSIS — G4733 Obstructive sleep apnea (adult) (pediatric): Secondary | ICD-10-CM

## 2021-11-07 DIAGNOSIS — Z0000 Encounter for general adult medical examination without abnormal findings: Secondary | ICD-10-CM

## 2021-11-07 DIAGNOSIS — E559 Vitamin D deficiency, unspecified: Secondary | ICD-10-CM

## 2021-11-07 DIAGNOSIS — E785 Hyperlipidemia, unspecified: Secondary | ICD-10-CM

## 2021-11-07 DIAGNOSIS — F418 Other specified anxiety disorders: Secondary | ICD-10-CM

## 2021-11-07 DIAGNOSIS — R519 Headache, unspecified: Secondary | ICD-10-CM | POA: Diagnosis not present

## 2021-11-07 NOTE — Assessment & Plan Note (Signed)
Check echo, hydrate continue to monitor

## 2021-11-07 NOTE — Progress Notes (Signed)
Subjective:   By signing my name below, I, Zite Okoli, attest that this documentation has been prepared under the direction and in the presence of [name], MD. 11/07/2021    Patient ID: [name] Jensen, female    DOB: 09-19-1956, 65 y.o.   MRN: [id number]  Chief Complaint  Patient presents with   Annual Exam    HPI Patient is in today for a comprehensive physical exam.  She reports she has been feeling very fatigued for the past month or two and it feels like her muscles are flaccid and she falls asleep being very exhausted.When she wakes up, her mouth is dry and she sleeps with her mouth open. Wakes up often during the night with her heart racing. She has been tachycardic recently but has a stable blood pressure and is doing good on 100 mg metoprolol. She is also stopping often to catch her breath and has had fuzzy vision for the past month. Went to an eye doctor before and was diagnosed with ocular migraines. She also has frequent headaches. She is also feeling better about her moods and has been less stressed.  She mentions she has been losing weight, is using 10 mg mounjaro but has side effects of nausea.  Wt Readings from Last 3 Encounters:  11/07/21 156 lb 6.4 oz (70.9 kg)  09/13/21 171 lb (77.6 kg)  08/11/21 183 lb 6 oz (83.2 kg)    She is now constipated but denies melena and blood in stool. She mentions she is now on a stricter diet and cut out all sweets and less carbohydrates. Her GI told her to eat less raw food due to gastroparesis and can't use Zofran. She sees him in the next 2 weeks. Has a history of diarrhea.  She had a sleep study done in the past but was considered borderline for sleep apnea. She is worried now and would like to do another test.  She denies fever, congestion, eye pain, chest pain, palpitations, leg swelling, shortness of breath, nausea, abdominal pain, diarrhea and blood in stool. Also denies dysuria, frequency, back pain and headaches.    She is still taking Vitamin D pills.   She is UTD on the flu, shingles and tetanus vaccines. She has 3 Covid-19 vaccines.   There has been no changes in family history.  Past Medical History:  Diagnosis Date   Adjustment reaction with anxiety and depression 06/12/2014   Anemia    h/o low ferritin   Anxiety    Asthma    environmental triggers   Chicken pox as a child   Cough 08/22/2015   Depression    Depression with anxiety 06/12/2014   Diverticulosis    Ehrlichiosis 0962   Gallstones    GERD (gastroesophageal reflux disease)    Hair loss 03/28/2017   Hiatal hernia 2007   History of viral illness    Hyperlipidemia, mixed 04/11/2015   Insomnia 08/21/2017   Lactose intolerance 06/12/2014   Mumps as a child   Overweight 08/19/2014   Rectocele 06/12/2014   RLS (restless legs syndrome)    SCC (squamous cell carcinoma) 06/12/2014   Right arm removed 2011   Sessile colonic polyp    SVT (supraventricular tachycardia) ([place])    Tachycardia 06/12/2014   Thyroid disease    Vitamin D deficiency 08/22/2015    Past Surgical History:  Procedure Laterality Date   ABDOMINAL HYSTERECTOMY  11-2008   total with cystocele, rectocele repair   APPENDECTOMY  CHOLECYSTECTOMY     COLONOSCOPY  2006   diverticulosis, hemorrhoids ([place])   ESOPHAGOGASTRODUODENOSCOPY  2006   small hiatus hernia ([place])   ESOPHAGOGASTRODUODENOSCOPY N/A 02/21/2018   Procedure: ESOPHAGOGASTRODUODENOSCOPY (EGD);  Surgeon: Rogene Houston, MD;  Location: [facility];  Service: Endoscopy;  Laterality: N/A;  pt knows to arrive at 2:45   [place]     right arm scc   svt ablation     TONSILLECTOMY  1972   2ith adenoid   WISDOM TOOTH EXTRACTION  65 yrs old    Family History  Problem Relation Age of Onset   Hypertension Mother    Hypertension Father    Hypertension Sister    Diabetes Sister        type 2   Fibromyalgia Sister    Proteinuria Sister    GI Bleed Maternal Grandmother     Atrial fibrillation Maternal Grandmother    Heart disease Maternal Grandfather    Diabetes Paternal Grandmother    Stroke Paternal Grandfather    Colon cancer Neg Hx    Esophageal cancer Neg Hx    Rectal cancer Neg Hx    Stomach cancer Neg Hx     Social History   Socioeconomic History   Marital status: Divorced    Spouse name: Not on file   Number of children: 2   Years of education: Not on file   Highest education level: Not on file  Occupational History   Occupation: Programmer, multimedia: [employer]  Tobacco Use   Smoking status: Never   Smokeless tobacco: Never  Vaping Use   Vaping Use: Never used  Substance and Sexual Activity   Alcohol use: No    Alcohol/week: 0.0 standard drinks   Drug use: No   Sexual activity: Not Currently    Comment: lives by self with 4 dogs works at [employer], avoids dairy  Other Topics Concern   Not on file  Social History Narrative   Not on file   Social Determinants of Health   Financial Resource Strain: Not on file  Food Insecurity: Not on file  Transportation Needs: Not on file  Physical Activity: Not on file  Stress: Not on file  Social Connections: Not on file  Intimate Partner Violence: Not on file    Outpatient Medications Prior to Visit  Medication Sig Dispense Refill   atorvastatin (LIPITOR) 10 MG tablet TAKE 1 TABLET BY MOUTH DAILY 90 tablet 1   blood glucose meter kit and supplies KIT Dispense based on patient and insurance preference. Use up to four times daily as directed. (FOR ICD-9 250.00, 250.01). 1 each 0   Blood Glucose Monitoring Suppl (FREESTYLE LITE) w/Device KIT USE TO CHECK BLOOD SUGAR UP TO 4 TIMES DAILY 1 kit 0   Cholecalciferol (VITAMIN D3) 5000 units CAPS Take 5,000 Units by mouth daily.      colesevelam (WELCHOL) 625 MG tablet TAKE 3 TABLETS BY MOUTH TWICE DAILY WITH MEALS 180 tablet 0   esomeprazole (NEXIUM) 40 MG capsule Take 1 capsule (40 mg total) by mouth 2 (two) times daily before a meal. 60  capsule 11   glucose blood test strip USE TO CHECK BLOOD SUGAR 4 TIMES A DAY (Patient taking differently: USE TO CHECK BLOOD SUGAR 4 TIMES A DAY) 100 strip 0   Lancets (FREESTYLE) lancets USE TO CHECK BLOOD SUGAR 4 TIMES A DAY (Patient taking differently: USE TO CHECK BLOOD SUGAR 4 TIMES  A DAY) 100 each 0   metoprolol succinate (TOPROL-XL) 100 MG 24 hr tablet TAKE 1 TABLET BY MOUTH TWICE A DAY, TAKE WITH OR IMMEDIATELY FOLLOWING A MEAL. 180 tablet 2   temazepam (RESTORIL) 30 MG capsule TAKE 1 CAPSULE BY MOUTH ONCE DAILY AT BEDTIME 30 capsule 5   tirzepatide (MOUNJARO) 10 MG/0.5ML Pen Inject 10 mg into the skin once a week. 6 mL 1   traZODone (DESYREL) 100 MG tablet Take 2 tablets (200 mg total) by mouth at bedtime. 180 tablet 1   tretinoin (RETIN-A) 0.05 % cream Apply to the face at bedtime 45 g 3   dicyclomine (BENTYL) 10 MG capsule Take 1 capsule (10 mg total) by mouth 3 (three) times daily before meals. 270 capsule 3   Hyoscyamine Sulfate SL 0.125 MG SUBL PLACE 1 TABLET UNDER THE TONGUE EVERY 4 HOURS AS NEEDED. 40 tablet 2   ondansetron (ZOFRAN) 4 MG tablet Take 1 tablet (4 mg total) by mouth every 8 (eight) hours as needed for nausea or vomiting. 40 tablet 2   Tretinoin Microsphere (RETIN-A MICRO PUMP) 0.08 % GEL Apply to the face at bedtime three nights a week, then gradually increase to every other night, then nightly as tolerated 50 g 3   No facility-administered medications prior to visit.    Allergies  Allergen Reactions   Dilaudid [Hydromorphone Hcl] Other (See Comments)    Respiratory , cardiac     Review of Systems  Constitutional:  Positive for malaise/fatigue. Negative for fever.  HENT:  Negative for congestion and sore throat.   Eyes:  Negative for pain.  Respiratory:  Negative for cough and shortness of breath.   Cardiovascular:  Negative for chest pain, palpitations and leg swelling.  Gastrointestinal:  Positive for constipation. Negative for abdominal pain, blood in  stool, diarrhea, melena and nausea.  Genitourinary:  Negative for dysuria and frequency.  Musculoskeletal:  Negative for back pain and myalgias.  Skin:  Negative for rash.  Neurological:  Positive for headaches. Negative for dizziness and weakness.  Psychiatric/Behavioral:  Negative for depression and suicidal ideas. The patient does not have insomnia.       Objective:    Physical Exam Constitutional:      General: She is not in acute distress.    Appearance: She is well-developed.  HENT:     Head: Normocephalic and atraumatic.     Right Ear: Tympanic membrane, ear canal and external ear normal.     Left Ear: Tympanic membrane, ear canal and external ear normal.  Eyes:     Extraocular Movements:     Right eye: No nystagmus.     Left eye: No nystagmus.     Conjunctiva/sclera: Conjunctivae normal.  Neck:     Thyroid: No thyromegaly.  Cardiovascular:     Rate and Rhythm: Normal rate and regular rhythm.     Heart sounds: Normal heart sounds. No murmur heard. Pulmonary:     Effort: Pulmonary effort is normal. No respiratory distress.     Breath sounds: Normal breath sounds.  Abdominal:     General: Bowel sounds are normal. There is no distension.     Palpations: Abdomen is soft. There is no mass.     Tenderness: There is no abdominal tenderness.  Musculoskeletal:     Cervical back: Neck supple.     Comments: 5/5 strength in upper and lower extremities   Lymphadenopathy:     Cervical: No cervical adenopathy.  Skin:    General: Skin  is warm and dry.  Neurological:     Mental Status: She is alert and oriented to person, place, and time.     Deep Tendon Reflexes:     Reflex Scores:      Patellar reflexes are 2+ on the right side and 2+ on the left side. Psychiatric:        Behavior: Behavior normal.    BP 102/64   Pulse 84   Temp 98 F (36.7 C)   Resp 16   Ht $R'5\' 4"'tr$  (1.626 m)   Wt 156 lb 6.4 oz (70.9 kg)   LMP  (LMP Unknown)   SpO2 91%   BMI 26.85 kg/m  Wt Readings  from Last 3 Encounters:  11/07/21 156 lb 6.4 oz (70.9 kg)  09/13/21 171 lb (77.6 kg)  08/11/21 183 lb 6 oz (83.2 kg)    Diabetic Foot Exam - Simple   No data filed    Lab Results  Component Value Date   WBC 6.4 08/17/2021   HGB 13.9 08/17/2021   HCT 41.9 08/17/2021   PLT 291 08/17/2021   GLUCOSE 110 (H) 08/17/2021   CHOL 175 08/17/2021   TRIG 182 (H) 08/17/2021   HDL 46 08/17/2021   LDLDIRECT 72.0 04/26/2020   LDLCALC 93 08/17/2021   ALT 22 08/17/2021   AST 20 08/17/2021   NA 136 08/17/2021   K 4.1 08/17/2021   CL 103 08/17/2021   CREATININE 0.88 08/17/2021   BUN 18 08/17/2021   CO2 26 08/17/2021   TSH 1.880 08/17/2021   INR 1.0 11/09/2008   HGBA1C 5.5 08/17/2021    Lab Results  Component Value Date   TSH 1.880 08/17/2021   Lab Results  Component Value Date   WBC 6.4 08/17/2021   HGB 13.9 08/17/2021   HCT 41.9 08/17/2021   MCV 85.7 08/17/2021   PLT 291 08/17/2021   Lab Results  Component Value Date   NA 136 08/17/2021   K 4.1 08/17/2021   CO2 26 08/17/2021   GLUCOSE 110 (H) 08/17/2021   BUN 18 08/17/2021   CREATININE 0.88 08/17/2021   BILITOT 0.7 08/17/2021   ALKPHOS 77 08/17/2021   AST 20 08/17/2021   ALT 22 08/17/2021   PROT 7.7 08/17/2021   ALBUMIN 4.5 08/17/2021   CALCIUM 9.1 08/17/2021   ANIONGAP 7 08/17/2021   GFR 77.61 05/03/2021   Lab Results  Component Value Date   CHOL 175 08/17/2021   Lab Results  Component Value Date   HDL 46 08/17/2021   Lab Results  Component Value Date   LDLCALC 93 08/17/2021   Lab Results  Component Value Date   TRIG 182 (H) 08/17/2021   Lab Results  Component Value Date   CHOLHDL 3.8 08/17/2021   Lab Results  Component Value Date   HGBA1C 5.5 08/17/2021        Mammogram: Last completed on 11/22/2020. Results were normal. Next appointment scheduled on 11/25/2021. Dexa: Last checked on 10/18/2020. The patient was considered normal according to the [facility]. Repeat in 2  years. Colonoscopy: Last completed on 02/19/2019. There were polyps found in the transverse colon and rectum that were removed with a cold snare. Moderate diverticulosis in the left colon and external hemorrhoids. Otherwise exam was normal. Repeat in 7 years.  Assessment & Plan:   Problem List Items Addressed This Visit     Anemia   Relevant Orders   CBC   OSA (obstructive sleep apnea)    Very mild  in past but with worsening concerning symptoms will reevaluate      Tachycardia - Primary    Check echo, hydrate continue to monitor      Relevant Orders   ECHOCARDIOGRAM COMPLETE   Depression with anxiety    She feels over all this is improving      Headache   Relevant Orders   Ambulatory referral to Pulmonology   Preventative health care    Patient encouraged to maintain heart healthy diet, regular exercise, adequate sleep. Consider daily probiotics. Take medications as prescribed. Patient no longer needs pap smears due to no cervic, no gyn complaints. Follows with dentist, last dex scan in 2021 of November. Sees dentist on Wedenesday, request flu shot from [facility]      Constipation   Relevant Orders   Comprehensive metabolic panel   TSH   Hyperlipidemia, mixed    Tolerating statin, encouraged heart healthy diet, avoid trans fats, minimize simple carbs and saturated fats. Increase exercise as tolerated      Hyperglycemia    hgba1c acceptable, minimize simple carbs. Increase exercise as tolerated.      Vitamin D deficiency    Supplement and monitor      Insulin resistance    Repeat insulin with next blood draw      Relevant Orders   Hemoglobin A1c   Insulin, random   Other Visit Diagnoses     DOE (dyspnea on exertion)       Relevant Orders   ECHOCARDIOGRAM COMPLETE   Other fatigue       Relevant Orders   Ambulatory referral to Pulmonology   Restless sleeper       Relevant Orders   Ambulatory referral to Pulmonology   Hyperlipidemia, unspecified hyperlipidemia  type       Relevant Orders   Lipid panel        No orders of the defined types were placed in this encounter.   I,Zite Okoli,acting as a Education administrator for Penni Homans, MD.,have documented all relevant documentation on the behalf of Penni Homans, MD,as directed by  Penni Homans, MD while in the presence of Penni Homans, MD.   I, [name], MD., personally preformed the services described in this documentation.  All medical record entries made by the scribe were at my direction and in my presence.  I have reviewed the chart and discharge instructions (if applicable) and agree that the record reflects my personal performance and is accurate and complete. 11/07/2021

## 2021-11-07 NOTE — Patient Instructions (Addendum)
Encouraged increased hydration and fiber in diet. Daily probiotics. If bowels not moving can use MOM 2 tbls po in 4 oz of warm prune juice by mouth every 2-3 days. If no results then repeat in 4 hours with  Dulcolax suppository pr, may repeat again in 4 more hours as needed. Seek care if symptoms worsen. Consider daily Miralax and/or Dulcolax if symptoms persist.    Yellow Split Pea Powder  Whey powder Collagen Chia seeds, ground flaxseeds  MINDBODYGREEN vegetable powder in the smoothie     Preventive Care 3-27 Years Old, Female Preventive care refers to lifestyle choices and visits with your health care provider that can promote health and wellness. Preventive care visits are also called wellness exams. What can I expect for my preventive care visit? Counseling Your health care provider may ask you questions about your: Medical history, including: Past medical problems. Family medical history. Pregnancy history. Current health, including: Menstrual cycle. Method of birth control. Emotional well-being. Home life and relationship well-being. Sexual activity and sexual health. Lifestyle, including: Alcohol, nicotine or tobacco, and drug use. Access to firearms. Diet, exercise, and sleep habits. Work and work Statistician. Sunscreen use. Safety issues such as seatbelt and bike helmet use. Physical exam Your health care provider will check your: Height and weight. These may be used to calculate your BMI (body mass index). BMI is a measurement that tells if you are at a healthy weight. Waist circumference. This measures the distance around your waistline. This measurement also tells if you are at a healthy weight and may help predict your risk of certain diseases, such as type 2 diabetes and high blood pressure. Heart rate and blood pressure. Body temperature. Skin for abnormal spots. What immunizations do I need? Vaccines are usually given at various ages, according to a  schedule. Your health care provider will recommend vaccines for you based on your age, medical history, and lifestyle or other factors, such as travel or where you work. What tests do I need? Screening Your health care provider may recommend screening tests for certain conditions. This may include: Lipid and cholesterol levels. Diabetes screening. This is done by checking your blood sugar (glucose) after you have not eaten for a while (fasting). Pelvic exam and Pap test. Hepatitis B test. Hepatitis C test. HIV (human immunodeficiency virus) test. STI (sexually transmitted infection) testing, if you are at risk. Lung cancer screening. Colorectal cancer screening. Mammogram. Talk with your health care provider about when you should start having regular mammograms. This may depend on whether you have a family history of breast cancer. BRCA-related cancer screening. This may be done if you have a family history of breast, ovarian, tubal, or peritoneal cancers. Bone density scan. This is done to screen for osteoporosis. Talk with your health care provider about your test results, treatment options, and if necessary, the need for more tests. Follow these instructions at home: Eating and drinking  Eat a diet that includes fresh fruits and vegetables, whole grains, lean protein, and low-fat dairy products. Take vitamin and mineral supplements as recommended by your health care provider. Do not drink alcohol if: Your health care provider tells you not to drink. You are pregnant, may be pregnant, or are planning to become pregnant. If you drink alcohol: Limit how much you have to 0-1 drink a day. Know how much alcohol is in your drink. In the [place] one drink equals one 12 oz bottle of beer (355 mL), one 5 oz glass of wine (148 mL),  or one 1 oz glass of hard liquor (44 mL). Lifestyle Brush your teeth every morning and night with fluoride toothpaste. Floss one time each day. Exercise for at least  30 minutes 5 or more days each week. Do not use any products that contain nicotine or tobacco. These products include cigarettes, chewing tobacco, and vaping devices, such as e-cigarettes. If you need help quitting, ask your health care provider. Do not use drugs. If you are sexually active, practice safe sex. Use a condom or other form of protection to prevent STIs. If you do not wish to become pregnant, use a form of birth control. If you plan to become pregnant, see your health care provider for a prepregnancy visit. Take aspirin only as told by your health care provider. Make sure that you understand how much to take and what form to take. Work with your health care provider to find out whether it is safe and beneficial for you to take aspirin daily. Find healthy ways to manage stress, such as: Meditation, yoga, or listening to music. Journaling. Talking to a trusted person. Spending time with friends and family. Minimize exposure to UV radiation to reduce your risk of skin cancer. Safety Always wear your seat belt while driving or riding in a vehicle. Do not drive: If you have been drinking alcohol. Do not ride with someone who has been drinking. When you are tired or distracted. While texting. If you have been using any mind-altering substances or drugs. Wear a helmet and other protective equipment during sports activities. If you have firearms in your house, make sure you follow all gun safety procedures. Seek help if you have been physically or sexually abused. What's next? Visit your health care provider once a year for an annual wellness visit. Ask your health care provider how often you should have your eyes and teeth checked. Stay up to date on all vaccines. This information is not intended to replace advice given to you by your health care provider. Make sure you discuss any questions you have with your health care provider. Document Revised: 05/18/2021 Document Reviewed:  05/18/2021 Elsevier Patient Education  [place].

## 2021-11-07 NOTE — Assessment & Plan Note (Signed)
Supplement and monitor 

## 2021-11-07 NOTE — Assessment & Plan Note (Addendum)
Patient encouraged to maintain heart healthy diet, regular exercise, adequate sleep. Consider daily probiotics. Take medications as prescribed. Patient no longer needs pap smears due to no cervic, no gyn complaints. Follows with dentist, last dex scan in 2021 of November. Sees dentist on Wedenesday, request flu shot from [facility]

## 2021-11-07 NOTE — Assessment & Plan Note (Signed)
Tolerating statin, encouraged heart healthy diet, avoid trans fats, minimize simple carbs and saturated fats. Increase exercise as tolerated 

## 2021-11-07 NOTE — Assessment & Plan Note (Signed)
Repeat insulin with next blood draw

## 2021-11-07 NOTE — Assessment & Plan Note (Signed)
She feels over all this is improving

## 2021-11-07 NOTE — Assessment & Plan Note (Signed)
Very mild in past but with worsening concerning symptoms will reevaluate

## 2021-11-07 NOTE — Assessment & Plan Note (Signed)
hgba1c acceptable, minimize simple carbs. Increase exercise as tolerated.  

## 2021-11-08 ENCOUNTER — Encounter: Payer: Self-pay | Admitting: Family Medicine

## 2021-11-09 ENCOUNTER — Encounter: Payer: Self-pay | Admitting: Family Medicine

## 2021-11-10 NOTE — Telephone Encounter (Signed)
fyi

## 2021-11-14 ENCOUNTER — Encounter: Payer: Self-pay | Admitting: Gastroenterology

## 2021-11-14 ENCOUNTER — Ambulatory Visit: Payer: 59 | Admitting: Gastroenterology

## 2021-11-14 VITALS — BP 90/66 | HR 108 | Ht 64.0 in | Wt 156.4 lb

## 2021-11-14 DIAGNOSIS — K5900 Constipation, unspecified: Secondary | ICD-10-CM | POA: Diagnosis not present

## 2021-11-14 DIAGNOSIS — K219 Gastro-esophageal reflux disease without esophagitis: Secondary | ICD-10-CM | POA: Diagnosis not present

## 2021-11-14 DIAGNOSIS — K3184 Gastroparesis: Secondary | ICD-10-CM | POA: Diagnosis not present

## 2021-11-14 MED ORDER — ESOMEPRAZOLE MAGNESIUM 40 MG PO CPDR: 40.0000 mg | DELAYED_RELEASE_CAPSULE | Freq: Every day | ORAL | 11 refills | Status: DC

## 2021-11-14 NOTE — Patient Instructions (Signed)
Decrease your Nexium to once daily dosing.   Start introducing more fiber back in your diet.   The [facility] providers would like to encourage you to use [facility] to communicate with providers for non-urgent requests or questions.  Due to long hold times on the telephone, sending your provider a message by [facility] may be a faster and more efficient way to get a response.  Please allow 48 business hours for a response.  Please remember that this is for non-urgent requests.   Thank you for choosing me and [facility].  Pricilla Riffle. Dagoberto Ligas., MD., Marval Regal

## 2021-11-14 NOTE — Progress Notes (Signed)
    History of Present Illness: This is a 65 year old female returning for follow-up of gastroparesis and GERD.  Her reflux symptoms are under excellent control since beginning a gastroparesis diet.  She has noted worsening problems with constipation and mild left lower quadrant pain.  She states it is difficult to drink MiraLAX.  Current Medications, Allergies, Past Medical History, Past Surgical History, Family History and Social History were reviewed in [employer] record.   Physical Exam: General: Well developed, well nourished, no acute distress Head: Normocephalic and atraumatic Eyes: Sclerae anicteric, EOMI Ears: Normal auditory acuity Mouth: Not examined, mask on during Covid-19 pandemic Lungs: Clear throughout to auscultation Heart: Regular rate and rhythm; no murmurs, rubs or bruits Abdomen: Soft, non tender and non distended. No masses, hepatosplenomegaly or hernias noted. Normal Bowel sounds Rectal:  Not done  Musculoskeletal: Symmetrical with no gross deformities  Pulses:  Normal pulses noted Extremities: No clubbing, cyanosis, edema or deformities noted Neurological: Alert oriented x 4, grossly nonfocal Psychological:  Alert and cooperative. Normal mood and affect   Assessment and Recommendations:  Gastroparesis.  Follow gastroparesis diet.  Add small amounts of fiber gradually to attempt to control constipation.  If gastroparesis or reflux symptoms worsen then reduce fiber intake. REV in 3 months.  GERD.  Under much better control with better control of gastroparesis.  Continue to follow antireflux measures.  Reduce Nexium to 40 mg p.o. daily. Constipation.  MiraLAX daily as needed and if she is unable to tolerate MiraLAX then LOC tablet qd prn.

## 2021-11-18 ENCOUNTER — Other Ambulatory Visit (HOSPITAL_COMMUNITY): Payer: Self-pay

## 2021-11-18 ENCOUNTER — Encounter: Payer: Self-pay | Admitting: Family Medicine

## 2021-11-18 ENCOUNTER — Other Ambulatory Visit: Payer: Self-pay | Admitting: Family Medicine

## 2021-11-18 MED ORDER — TIRZEPATIDE 12.5 MG/0.5ML ~~LOC~~ SOAJ
12.5000 mg | SUBCUTANEOUS | 0 refills | Status: DC
  Filled 2021-11-18: qty 6, 84d supply, fill #0

## 2021-11-18 MED ORDER — TIRZEPATIDE 15 MG/0.5ML ~~LOC~~ SOAJ
15.0000 mg | SUBCUTANEOUS | 1 refills | Status: DC
  Filled 2021-11-18: qty 6, 84d supply, fill #0
  Filled 2022-01-02: qty 2, 28d supply, fill #0
  Filled 2022-01-25: qty 2, 28d supply, fill #1

## 2021-11-21 ENCOUNTER — Other Ambulatory Visit (HOSPITAL_COMMUNITY): Payer: Self-pay

## 2021-11-24 ENCOUNTER — Other Ambulatory Visit: Payer: Self-pay

## 2021-11-24 ENCOUNTER — Ambulatory Visit (HOSPITAL_COMMUNITY)
Admission: RE | Admit: 2021-11-24 | Discharge: 2021-11-24 | Disposition: A | Discharge status: Home / Self Care | Payer: 59 | Source: Ambulatory Visit | Attending: Family Medicine | Admitting: Family Medicine

## 2021-11-24 DIAGNOSIS — Z1231 Encounter for screening mammogram for malignant neoplasm of breast: Secondary | ICD-10-CM | POA: Diagnosis not present

## 2021-11-25 ENCOUNTER — Ambulatory Visit (HOSPITAL_COMMUNITY): Payer: 59

## 2021-11-29 ENCOUNTER — Other Ambulatory Visit (INDEPENDENT_AMBULATORY_CARE_PROVIDER_SITE_OTHER): Payer: 59

## 2021-11-29 DIAGNOSIS — E8881 Metabolic syndrome: Secondary | ICD-10-CM | POA: Diagnosis not present

## 2021-11-29 DIAGNOSIS — E785 Hyperlipidemia, unspecified: Secondary | ICD-10-CM | POA: Diagnosis not present

## 2021-11-29 DIAGNOSIS — D649 Anemia, unspecified: Secondary | ICD-10-CM | POA: Diagnosis not present

## 2021-11-29 DIAGNOSIS — K5900 Constipation, unspecified: Secondary | ICD-10-CM

## 2021-11-29 LAB — COMPREHENSIVE METABOLIC PANEL
ALT: 16 U/L (ref 0–35)
AST: 17 U/L (ref 0–37)
Albumin: 4.3 g/dL (ref 3.5–5.2)
Alkaline Phosphatase: 56 U/L (ref 39–117)
BUN: 18 mg/dL (ref 6–23)
CO2: 24 mEq/L (ref 19–32)
Calcium: 9.7 mg/dL (ref 8.4–10.5)
Chloride: 103 mEq/L (ref 96–112)
Creatinine, Ser: 1.05 mg/dL (ref 0.40–1.20)
GFR: 56.61 mL/min — ABNORMAL LOW (ref >60.00)
Glucose, Bld: 98 mg/dL (ref 70–99)
Potassium: 4 mEq/L (ref 3.5–5.1)
Sodium: 137 mEq/L (ref 135–145)
Total Bilirubin: 0.6 mg/dL (ref 0.2–1.2)
Total Protein: 7.3 g/dL (ref 6.0–8.3)

## 2021-11-29 LAB — CBC
HCT: 40.2 % (ref 36.0–46.0)
Hemoglobin: 13.7 g/dL (ref 12.0–15.0)
MCHC: 34 g/dL (ref 30.0–36.0)
MCV: 82.1 fl (ref 78.0–100.0)
Platelets: 327 10*3/uL (ref 150.0–400.0)
RBC: 4.9 Mil/uL (ref 3.87–5.11)
RDW: 13.6 % (ref 11.5–15.5)
WBC: 6.6 10*3/uL (ref 4.0–10.5)

## 2021-11-29 LAB — LIPID PANEL
Cholesterol: 197 mg/dL (ref 0–200)
HDL: 39.6 mg/dL (ref >39.00)
LDL Cholesterol: 124 mg/dL — ABNORMAL HIGH (ref 0–99)
NonHDL: 157.51
Total CHOL/HDL Ratio: 5
Triglycerides: 167 mg/dL — ABNORMAL HIGH (ref 0.0–149.0)
VLDL: 33.4 mg/dL (ref 0.0–40.0)

## 2021-11-29 LAB — TSH: TSH: 1.59 u[IU]/mL (ref 0.35–5.50)

## 2021-11-29 LAB — HEMOGLOBIN A1C: Hgb A1c MFr Bld: 5.6 % (ref 4.6–6.5)

## 2021-11-30 ENCOUNTER — Encounter: Payer: Self-pay | Admitting: Family Medicine

## 2021-11-30 LAB — INSULIN, RANDOM: Insulin: 10.1 u[IU]/mL

## 2021-12-09 ENCOUNTER — Other Ambulatory Visit (HOSPITAL_COMMUNITY): Payer: Self-pay

## 2021-12-09 ENCOUNTER — Other Ambulatory Visit (HOSPITAL_BASED_OUTPATIENT_CLINIC_OR_DEPARTMENT_OTHER): Payer: Self-pay

## 2021-12-12 ENCOUNTER — Other Ambulatory Visit (HOSPITAL_COMMUNITY): Payer: Self-pay

## 2021-12-12 ENCOUNTER — Other Ambulatory Visit (HOSPITAL_BASED_OUTPATIENT_CLINIC_OR_DEPARTMENT_OTHER): Payer: Self-pay

## 2021-12-12 ENCOUNTER — Ambulatory Visit: Payer: 59 | Admitting: Family Medicine

## 2021-12-14 ENCOUNTER — Encounter: Payer: Self-pay | Admitting: Family Medicine

## 2021-12-14 ENCOUNTER — Encounter: Payer: Self-pay | Admitting: Pulmonary Disease

## 2021-12-14 ENCOUNTER — Other Ambulatory Visit: Payer: Self-pay

## 2021-12-14 ENCOUNTER — Ambulatory Visit (HOSPITAL_BASED_OUTPATIENT_CLINIC_OR_DEPARTMENT_OTHER)
Admission: RE | Admit: 2021-12-14 | Discharge: 2021-12-14 | Disposition: A | Discharge status: Home / Self Care | Payer: 59 | Source: Ambulatory Visit | Attending: Family Medicine | Admitting: Family Medicine

## 2021-12-14 ENCOUNTER — Ambulatory Visit (INDEPENDENT_AMBULATORY_CARE_PROVIDER_SITE_OTHER): Payer: 59 | Admitting: Pulmonary Disease

## 2021-12-14 VITALS — BP 116/76 | HR 93 | Temp 98.2°F | Ht 64.0 in | Wt 147.0 lb

## 2021-12-14 DIAGNOSIS — G4733 Obstructive sleep apnea (adult) (pediatric): Secondary | ICD-10-CM

## 2021-12-14 DIAGNOSIS — R0609 Other forms of dyspnea: Secondary | ICD-10-CM | POA: Insufficient documentation

## 2021-12-14 DIAGNOSIS — R000 Tachycardia, unspecified: Secondary | ICD-10-CM | POA: Diagnosis not present

## 2021-12-14 LAB — ECHOCARDIOGRAM COMPLETE
AR max vel: 2.47 cm2
AV Area VTI: 2.67 cm2
AV Area mean vel: 2.32 cm2
AV Mean grad: 3 mmHg
AV Peak grad: 5.4 mmHg
AV Vena cont: 0.3 cm
Ao pk vel: 1.16 m/s
Area-P 1/2: 4.39 cm2
Calc EF: 44.4 %
P 1/2 time: 479 msec
S' Lateral: 3.5 cm
Single Plane A2C EF: 46.2 %
Single Plane A4C EF: 42.1 %

## 2021-12-14 NOTE — Patient Instructions (Signed)
We will schedule you for an in lab polysomnogram to rule out significant obstructive sleep apnea  I will tentatively see you in about 3 to 4 months  I will call you with results as soon as reviewed  Continue weight loss efforts  Call with significant concerns  Sleep Apnea Sleep apnea affects breathing during sleep. It causes breathing to stop for 10 seconds or more, or to become shallow. People with sleep apnea usually snore loudly. It can also increase the risk of: Heart attack. Stroke. Being very overweight (obese). Diabetes. Heart failure. Irregular heartbeat. High blood pressure. The goal of treatment is to help you breathe normally again. What are the causes? The most common cause of this condition is a collapsed or blocked airway. There are three kinds of sleep apnea: Obstructive sleep apnea. This is caused by a blocked or collapsed airway. Central sleep apnea. This happens when the brain does not send the right signals to the muscles that control breathing. Mixed sleep apnea. This is a combination of obstructive and central sleep apnea. What increases the risk? Being overweight. Smoking. Having a small airway. Being older. Being female. Drinking alcohol. Taking medicines to calm yourself (sedatives or tranquilizers). Having family members with the condition. Having a tongue or tonsils that are larger than normal. What are the signs or symptoms? Trouble staying asleep. Loud snoring. Headaches in the morning. Waking up gasping. Dry mouth or sore throat in the morning. Being sleepy or tired during the day. If you are sleepy or tired during the day, you may also: Not be able to focus your mind (concentrate). Forget things. Get angry a lot and have mood swings. Feel sad (depressed). Have changes in your personality. Have less interest in sex, if you are female. Be unable to have an erection, if you are female. How is this treated?  Sleeping on your side. Using a  medicine to get rid of mucus in your nose (decongestant). Avoiding the use of alcohol, medicines to help you relax, or certain pain medicines (narcotics). Losing weight, if needed. Changing your diet. Quitting smoking. Using a machine to open your airway while you sleep, such as: An oral appliance. This is a mouthpiece that shifts your lower jaw forward. A CPAP device. This device blows air through a mask when you breathe out (exhale). An EPAP device. This has valves that you put in each nostril. A BIPAP device. This device blows air through a mask when you breathe in (inhale) and breathe out. Having surgery if other treatments do not work. Follow these instructions at home: Lifestyle Make changes that your doctor recommends. Eat a healthy diet. Lose weight if needed. Avoid alcohol, medicines to help you relax, and some pain medicines. Do not smoke or use any products that contain nicotine or tobacco. If you need help quitting, ask your doctor. General instructions Take over-the-counter and prescription medicines only as told by your doctor. If you were given a machine to use while you sleep, use it only as told by your doctor. If you are having surgery, make sure to tell your doctor you have sleep apnea. You may need to bring your device with you. Keep all follow-up visits. Contact a doctor if: The machine that you were given to use during sleep bothers you or does not seem to be working. You do not get better. You get worse. Get help right away if: Your chest hurts. You have trouble breathing in enough air. You have an uncomfortable feeling in your  back, arms, or stomach. You have trouble talking. One side of your body feels weak. A part of your face is hanging down. These symptoms may be an emergency. Get help right away. Call your local emergency services (911 in the [place] Do not wait to see if the symptoms will go away. Do not drive yourself to the hospital. Summary This  condition affects breathing during sleep. The most common cause is a collapsed or blocked airway. The goal of treatment is to help you breathe normally while you sleep. This information is not intended to replace advice given to you by your health care provider. Make sure you discuss any questions you have with your health care provider. Document Revised: 06/29/2021 Document Reviewed: 10/29/2020 Elsevier Patient Education  2022 [employer].

## 2021-12-14 NOTE — Progress Notes (Signed)
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 She had had a sleep study done about 2016 that revealed mild obstructive sleep apnea study Previous study over 10 years ago was inconclusive  Wakes up in the morning with a dry mouth, parched lips, sometimes awakens from sleep with tachycardia -Does have a history of tachycardia Daytime fatigue  Dad snored  Memory is fine  Usually goes to bed about 10 PM, takes about 30 minutes to fall asleep, about 2 awakenings Final wake up time about 5:30 AM  She is down about 45 pounds recently   Outpatient Encounter Medications as of 12/14/2021  Medication Sig   atorvastatin (LIPITOR) 10 MG tablet TAKE 1 TABLET BY MOUTH DAILY   blood glucose meter kit and supplies KIT Dispense based on patient and insurance preference. Use up to four times daily as directed. (FOR ICD-9 250.00, 250.01).   Blood Glucose Monitoring Suppl (FREESTYLE LITE) w/Device KIT USE TO CHECK BLOOD SUGAR UP TO 4 TIMES DAILY   Cholecalciferol (VITAMIN D3) 5000 units CAPS Take 5,000 Units by mouth daily.    colesevelam (WELCHOL) 625 MG tablet TAKE 3 TABLETS BY MOUTH TWICE DAILY WITH MEALS   esomeprazole (NEXIUM) 40 MG capsule Take 1 capsule (40 mg total) by mouth daily.   glucose blood test strip USE TO CHECK BLOOD SUGAR 4 TIMES A DAY (Patient taking differently: USE TO CHECK BLOOD SUGAR 4 TIMES A DAY)   Lancets (FREESTYLE) lancets USE TO CHECK BLOOD SUGAR 4 TIMES A DAY (Patient taking differently: USE TO CHECK BLOOD SUGAR 4 TIMES A DAY)   metoprolol succinate (TOPROL-XL) 100 MG 24 hr tablet TAKE 1 TABLET BY MOUTH TWICE A DAY, TAKE WITH OR IMMEDIATELY FOLLOWING A MEAL.    temazepam (RESTORIL) 30 MG capsule TAKE 1 CAPSULE BY MOUTH ONCE DAILY AT BEDTIME   tirzepatide (MOUNJARO) 12.5 MG/0.5ML Pen Inject 12.5 mg into the skin once a week.   tirzepatide (MOUNJARO) 15 MG/0.5ML Pen Inject 15 mg into the skin once a week.   traZODone (DESYREL) 100 MG tablet Take 2 tablets (200 mg total) by mouth at bedtime.   tretinoin (RETIN-A) 0.05 % cream Apply to the face at bedtime   No facility-administered encounter medications on file as of 12/14/2021.    Allergies as of 12/14/2021 - Review Complete 12/14/2021  Allergen Reaction Noted   Dilaudid [hydromorphone hcl] Other (See Comments) 09/23/2011    Past Medical History:  Diagnosis Date   Adjustment reaction with anxiety and depression 06/12/2014   Anemia    h/o low ferritin   Anxiety    Asthma    environmental triggers   Chicken pox as a child   Cough 08/22/2015   Depression    Depression with anxiety 06/12/2014   Diverticulosis    Ehrlichiosis 7096   Gallstones    GERD (gastroesophageal reflux disease)    Hair loss 03/28/2017   Hiatal hernia 2007   History of viral illness    Hyperlipidemia, mixed 04/11/2015   Insomnia 08/21/2017   Lactose intolerance 06/12/2014   Mumps as a child   Overweight 08/19/2014   Rectocele 06/12/2014  RLS (restless legs syndrome)    SCC (squamous cell carcinoma) 06/12/2014   Right arm removed 2011   Sessile colonic polyp    SVT (supraventricular tachycardia) ([facility])    Tachycardia 06/12/2014   Thyroid disease    Vitamin D deficiency 08/22/2015    Past Surgical History:  Procedure Laterality Date   ABDOMINAL HYSTERECTOMY  11-2008   total with cystocele, rectocele repair   APPENDECTOMY     CHOLECYSTECTOMY     COLONOSCOPY  2006   diverticulosis, hemorrhoids ([place])   ESOPHAGOGASTRODUODENOSCOPY  2006   small hiatus hernia ([place])   ESOPHAGOGASTRODUODENOSCOPY N/A 02/21/2018   Procedure: ESOPHAGOGASTRODUODENOSCOPY (EGD);  Surgeon: Rogene Houston, MD;  Location: [facility];   Service: Endoscopy;  Laterality: N/A;  pt knows to arrive at 2:45   [place]     right arm scc   svt ablation     TONSILLECTOMY  1972   2ith adenoid   WISDOM TOOTH EXTRACTION  66 yrs old    Family History  Problem Relation Age of Onset   Hypertension Mother    Hypertension Father    Hypertension Sister    Diabetes Sister        type 2   Fibromyalgia Sister    Proteinuria Sister    GI Bleed Maternal Grandmother    Atrial fibrillation Maternal Grandmother    Heart disease Maternal Grandfather    Diabetes Paternal Grandmother    Stroke Paternal Grandfather    Colon cancer Neg Hx    Esophageal cancer Neg Hx    Rectal cancer Neg Hx    Stomach cancer Neg Hx     Social History   Socioeconomic History   Marital status: Divorced    Spouse name: Not on file   Number of children: 2   Years of education: Not on file   Highest education level: Not on file  Occupational History   Occupation: Programmer, multimedia: [name]  Tobacco Use   Smoking status: Never   Smokeless tobacco: Never  Vaping Use   Vaping Use: Never used  Substance and Sexual Activity   Alcohol use: No    Alcohol/week: 0.0 standard drinks   Drug use: No   Sexual activity: Not Currently    Comment: lives by self with 4 dogs works at [employer], avoids dairy  Other Topics Concern   Not on file  Social History Narrative   Not on file   Social Determinants of Health   Financial Resource Strain: Not on file  Food Insecurity: Not on file  Transportation Needs: Not on file  Physical Activity: Not on file  Stress: Not on file  Social Connections: Not on file  Intimate Partner Violence: Not on file    Review of Systems  Constitutional:  Positive for fatigue.  Psychiatric/Behavioral:  Positive for sleep disturbance.    Vitals:   12/14/21 1455  BP: 116/76  Pulse: 93  Temp: 98.2 F (36.8 C)  SpO2: 99%     Physical Exam Constitutional:      Appearance: Normal  appearance.  HENT:     Head: Normocephalic.     Mouth/Throat:     Mouth: Mucous membranes are moist.     Comments: Macroglossia, Mallampati 2 Eyes:     Pupils: Pupils are equal, round, and reactive to light.  Cardiovascular:     Rate and Rhythm: Normal rate and regular rhythm.     Heart sounds: No  murmur heard.   No friction rub.  Pulmonary:     Effort: No respiratory distress.     Breath sounds: No stridor. No wheezing or rhonchi.  Musculoskeletal:     Cervical back: No rigidity or tenderness.  Neurological:     Mental Status: She is alert.  Psychiatric:        Mood and Affect: Mood normal.   Results of the Epworth flowsheet 12/14/2021  Sitting and reading 3  Watching TV 3  Sitting, inactive in a public place (e.g. a theatre or a meeting) 0  As a passenger in a car for an hour without a break 1  Lying down to rest in the afternoon when circumstances permit 3  Sitting and talking to someone 0  Sitting quietly after a lunch without alcohol 0  In a car, while stopped for a few minutes in traffic 0  Total score 10     Data Reviewed: Her sleep study from 02/05/2015 did reveal mild obstructive sleep apnea with an AHI of 7.5  Echo 02/02/9517 shows diastolic dysfunction  Assessment:  Excessive daytime sleepiness  History of snoring, dry mouth  Moderate probability of significant obstructive sleep apnea  Pathophysiology of sleep disordered breathing discussed with the patient  Treatment options for sleep disordered breathing discussed with the patient  Patient has bruxism and is being considered for a dental guard, an oral device may be an option of treatment as well  Plan/Recommendations: Tentative follow-up in 4 to 6 months  We will schedule the patient for an in lab polysomnogram as this will be more appropriate for somebody who had an inconclusive study in the past, mildly positive study in the past to definitively rule out significant obstructive sleep apnea since she  has lost a lot of weight recently as well  Encouraged to give [place] a call if she has any significant concerns   Sherrilyn Rist MD [facility] 12/14/2021, 3:02 PM  CC: [name] Lukes, MD

## 2021-12-15 ENCOUNTER — Other Ambulatory Visit: Payer: Self-pay | Admitting: Family Medicine

## 2021-12-15 DIAGNOSIS — E782 Mixed hyperlipidemia: Secondary | ICD-10-CM

## 2021-12-15 DIAGNOSIS — R739 Hyperglycemia, unspecified: Secondary | ICD-10-CM

## 2021-12-15 DIAGNOSIS — I359 Nonrheumatic aortic valve disorder, unspecified: Secondary | ICD-10-CM

## 2022-01-02 ENCOUNTER — Other Ambulatory Visit (HOSPITAL_COMMUNITY): Payer: Self-pay

## 2022-01-06 ENCOUNTER — Ambulatory Visit (INDEPENDENT_AMBULATORY_CARE_PROVIDER_SITE_OTHER): Payer: 59

## 2022-01-06 ENCOUNTER — Other Ambulatory Visit: Payer: Self-pay

## 2022-01-06 ENCOUNTER — Ambulatory Visit: Payer: 59 | Admitting: Student

## 2022-01-06 ENCOUNTER — Other Ambulatory Visit (HOSPITAL_COMMUNITY): Payer: Self-pay

## 2022-01-06 ENCOUNTER — Encounter: Payer: Self-pay | Admitting: Student

## 2022-01-06 VITALS — BP 117/72 | HR 86 | Ht 64.0 in | Wt 145.0 lb

## 2022-01-06 DIAGNOSIS — R002 Palpitations: Secondary | ICD-10-CM

## 2022-01-06 DIAGNOSIS — R55 Syncope and collapse: Secondary | ICD-10-CM

## 2022-01-06 DIAGNOSIS — E782 Mixed hyperlipidemia: Secondary | ICD-10-CM | POA: Diagnosis not present

## 2022-01-06 DIAGNOSIS — R0609 Other forms of dyspnea: Secondary | ICD-10-CM | POA: Diagnosis not present

## 2022-01-06 DIAGNOSIS — I351 Nonrheumatic aortic (valve) insufficiency: Secondary | ICD-10-CM

## 2022-01-06 MED ORDER — BISOPROLOL FUMARATE 5 MG PO TABS
2.5000 mg | ORAL_TABLET | Freq: Every day | ORAL | 3 refills | Status: DC
  Filled 2022-01-06: qty 45, 90d supply, fill #0
  Filled 2022-03-26: qty 45, 90d supply, fill #1
  Filled 2022-06-20: qty 45, 90d supply, fill #2
  Filled 2022-12-08: qty 45, 90d supply, fill #3

## 2022-01-06 NOTE — Progress Notes (Signed)
Cardiology Office Note    Date:  01/06/2022   ID:  [name], DOB 09-29-1956, MRN [id number]  PCP:  [name], MD  Cardiologist: Previously followed by Dr. Bronson Ing  Chief Complaint  Patient presents with   Follow-up    History of Present Illness:    [name] Jensen is a 66 y.o. female with past medical history of SVT (s/p prior ablation and recurrent inappropriate sinus tachycardia by review of notes), palpitations, gastroparesis, HLD and aortic regurgitation who presents to the office today for follow-up from her recent echocardiogram.  She was last examined by Dr. Bronson Ing in 12/2017 and had recently been switched from Toprol-XL to [facility] but developed chest pressure and a headache with the medication. Given her chest pain, a Coronary CT was recommended for further evaluation and this showed a coronary calcium score of 0.  She was examined by her PCP in 11/2021 and reported worsening dyspnea on exertion, therefore a follow-up echocardiogram was recommended for further assessment. This showed an EF of 50 to 55% with no regional wall motion abnormalities. She did have grade 1 diastolic dysfunction and mild to moderate aortic valve regurgitation. She also followed up with Pulmonology in the interim with plans for an outpatient sleep study which is tentatively scheduled in 02/2022.  In talking with the patient today, she reports having worsening dyspnea on exertion for the past several months and notices this mostly when walking up inclines. She was previously walking her dogs routinely but has not been doing so over the past several months. She has lost over 40 pounds within the past 7 months with dietary changes and since being on Mounjaro. She was previously taking Toprol-XL 100 mg twice daily but has gradually reduced this over the past few months. She was taking Toprol-XL 50 mg daily last week but had a presyncopal episode this past Tuesday and was found to be  orthostatic at that time. Since then, she has been taking Toprol-XL 25 mg daily. No reported orthopnea or PND. Does have palpitations which sometimes awaken her from sleep but do not resemble her prior SVT.   Past Medical History:  Diagnosis Date   Adjustment reaction with anxiety and depression 06/12/2014   Anemia    h/o low ferritin   Anxiety    Asthma    environmental triggers   Chicken pox as a child   Cough 08/22/2015   Depression    Depression with anxiety 06/12/2014   Diverticulosis    Ehrlichiosis 4373   Gallstones    GERD (gastroesophageal reflux disease)    Hair loss 03/28/2017   Hiatal hernia 2007   History of viral illness    Hyperlipidemia, mixed 04/11/2015   Insomnia 08/21/2017   Lactose intolerance 06/12/2014   Mumps as a child   Overweight 08/19/2014   Rectocele 06/12/2014   RLS (restless legs syndrome)    SCC (squamous cell carcinoma) 06/12/2014   Right arm removed 2011   Sessile colonic polyp    SVT (supraventricular tachycardia) ([place])    a. ablation in 05/2006   Tachycardia 06/12/2014   Thyroid disease    Vitamin D deficiency 08/22/2015    Past Surgical History:  Procedure Laterality Date   ABDOMINAL HYSTERECTOMY  11-2008   total with cystocele, rectocele repair   APPENDECTOMY     CHOLECYSTECTOMY     COLONOSCOPY  2006   diverticulosis, hemorrhoids ([place])   ESOPHAGOGASTRODUODENOSCOPY  2006   small hiatus hernia ([place])   ESOPHAGOGASTRODUODENOSCOPY N/A 02/21/2018  Procedure: ESOPHAGOGASTRODUODENOSCOPY (EGD);  Surgeon: Rogene Houston, MD;  Location: [facility];  Service: Endoscopy;  Laterality: N/A;  pt knows to arrive at 2:45   [place]     right arm scc   svt ablation     TONSILLECTOMY  1972   2ith adenoid   WISDOM TOOTH EXTRACTION  66 yrs old    Current Medications: Outpatient Medications Prior to Visit  Medication Sig Dispense Refill   atorvastatin (LIPITOR) 10 MG tablet TAKE 1 TABLET BY MOUTH DAILY  90 tablet 1   blood glucose meter kit and supplies KIT Dispense based on patient and insurance preference. Use up to four times daily as directed. (FOR ICD-9 250.00, 250.01). 1 each 0   Blood Glucose Monitoring Suppl (FREESTYLE LITE) w/Device KIT USE TO CHECK BLOOD SUGAR UP TO 4 TIMES DAILY 1 kit 0   Cholecalciferol (VITAMIN D3) 5000 units CAPS Take 5,000 Units by mouth daily.      esomeprazole (NEXIUM) 40 MG capsule Take 1 capsule (40 mg total) by mouth daily. 60 capsule 11   glucose blood test strip USE TO CHECK BLOOD SUGAR 4 TIMES A DAY (Patient taking differently: USE TO CHECK BLOOD SUGAR 4 TIMES A DAY) 100 strip 0   Lancets (FREESTYLE) lancets USE TO CHECK BLOOD SUGAR 4 TIMES A DAY (Patient taking differently: USE TO CHECK BLOOD SUGAR 4 TIMES A DAY) 100 each 0   temazepam (RESTORIL) 30 MG capsule TAKE 1 CAPSULE BY MOUTH ONCE DAILY AT BEDTIME 30 capsule 5   tirzepatide (MOUNJARO) 12.5 MG/0.5ML Pen Inject 12.5 mg into the skin once a week. 6 mL 0   tirzepatide (MOUNJARO) 15 MG/0.5ML Pen Inject 15 mg into the skin once a week. 6 mL 1   traZODone (DESYREL) 100 MG tablet Take 2 tablets (200 mg total) by mouth at bedtime. 180 tablet 1   tretinoin (RETIN-A) 0.05 % cream Apply to the face at bedtime 45 g 3   metoprolol succinate (TOPROL-XL) 100 MG 24 hr tablet TAKE 1 TABLET BY MOUTH TWICE A DAY, TAKE WITH OR IMMEDIATELY FOLLOWING A MEAL. 180 tablet 2   colesevelam (WELCHOL) 625 MG tablet TAKE 3 TABLETS BY MOUTH TWICE DAILY WITH MEALS 180 tablet 0   No facility-administered medications prior to visit.     Allergies:   Dilaudid [hydromorphone hcl]   Social History   Socioeconomic History   Marital status: Divorced    Spouse name: Not on file   Number of children: 2   Years of education: Not on file   Highest education level: Not on file  Occupational History   Occupation: Programmer, multimedia: [employer]  Tobacco Use   Smoking status: Never   Smokeless tobacco: Never  Vaping Use   Vaping  Use: Never used  Substance and Sexual Activity   Alcohol use: No    Alcohol/week: 0.0 standard drinks   Drug use: No   Sexual activity: Not Currently    Comment: lives by self with 4 dogs works at [employer], avoids dairy  Other Topics Concern   Not on file  Social History Narrative   Not on file   Social Determinants of Health   Financial Resource Strain: Not on file  Food Insecurity: Not on file  Transportation Needs: Not on file  Physical Activity: Not on file  Stress: Not on file  Social Connections: Not on file     Family History:  The patient's  family history includes Atrial fibrillation in her maternal grandmother; Diabetes in her paternal grandmother and sister; Fibromyalgia in her sister; GI Bleed in her maternal grandmother; Heart disease in her maternal grandfather; Hypertension in her father, mother, and sister; Proteinuria in her sister; Stroke in her paternal grandfather.   Review of Systems:    Please see the history of present illness.     All other systems reviewed and are otherwise negative except as noted above.   Physical Exam:    VS:  BP 117/72    Pulse 86    Ht _0  (1.626 m)    Wt 145 lb (65.8 kg)    LMP  (LMP Unknown)    SpO2 99%    BMI 24.89 kg/m    General: Well developed, well nourished,female appearing in no acute distress. Head: Normocephalic, atraumatic. Neck: No carotid bruits. JVD not elevated.  Lungs: Respirations regular and unlabored, without wheezes or rales.  Heart: Regular rate and rhythm. No S3 or S4.  No murmur, no rubs, or gallops appreciated. Abdomen: Appears non-distended. No obvious abdominal masses. Msk:  Strength and tone appear normal for age. No obvious joint deformities or effusions. Extremities: No clubbing or cyanosis. No pitting edema.  Neuro: Alert and oriented X 3. Moves all extremities spontaneously. No focal deficits noted. Psych:  Responds to questions appropriately with a normal affect. Skin: No rashes or lesions  noted  Wt Readings from Last 3 Encounters:  01/06/22 145 lb (65.8 kg)  12/14/21 147 lb (66.7 kg)  11/14/21 156 lb 6 oz (70.9 kg)     Studies/Labs Reviewed:   EKG:  EKG is ordered today.  The ekg ordered today demonstrates NSR, HR 86 with no acute ST changes.   Recent Labs: 01/17/2021: Magnesium 1.9 11/29/2021: ALT 16; BUN 18; Creatinine, Ser 1.05; Hemoglobin 13.7; Platelets 327.0; Potassium 4.0; Sodium 137; TSH 1.59   Lipid Panel    Component Value Date/Time   CHOL 197 11/29/2021 0910   TRIG 167.0 (H) 11/29/2021 0910   HDL 39.60 11/29/2021 0910   CHOLHDL 5 11/29/2021 0910   VLDL 33.4 11/29/2021 0910   LDLCALC 124 (H) 11/29/2021 0910   LDLCALC 160 (H) 09/14/2020 1136   LDLDIRECT 72.0 04/26/2020 1400    Additional studies/ records that were reviewed today include:   Coronary CT: 01/2018 Aorta:  Normal size.  No calcifications.  No dissection.   Aortic Valve:  Trileaflet.  No calcifications.   Coronary Arteries:  Normal coronary origin.  Right dominance.   RCA is a large dominant artery that gives rise to PDA and PLA. There is no plaque.   Left main is a large artery that gives rise to LAD and LCX arteries. LM has no plaque.   LAD is a large vessel that gives rise to one diagonal artery and has no plaque.   LCX is a non-dominant artery that gives rise to one large OM1 branch. There is no plaque.   Other findings:   Normal pulmonary vein drainage into the left atrium.   Normal let atrial appendage without a thrombus.   IMPRESSION: 1. Coronary calcium score of 0. This was 0 percentile for age and sex matched control.   2. Normal coronary origin with right dominance.   3. No evidence of CAD.   4. Mildly dilated pulmonary artery measuring 33 mm suggestive of pulmonary hypertension.    Echocardiogram: 12/14/2021 IMPRESSIONS     1. Left ventricular ejection fraction, by estimation, is 50 to 55%. The  left ventricle has low normal function. The left  ventricle has no regional  wall motion abnormalities. Left ventricular diastolic parameters are  consistent with Grade I diastolic  dysfunction (impaired relaxation).   2. Right ventricular systolic function is normal. The right ventricular  size is normal.   3. The mitral valve is normal in structure. No evidence of mitral valve  regurgitation. No evidence of mitral stenosis.   4. The aortic valve is normal in structure. Aortic valve regurgitation is  mild to moderate. No aortic stenosis is present.   5. The inferior vena cava is normal in size with greater than 50%  respiratory variability, suggesting right atrial pressure of 3 mmHg.   Comparison(s): LVEF 55-60%, Mild AI.   Assessment:    1. Heart palpitations   2. Near syncope   3. DOE (dyspnea on exertion)   4. Aortic valve insufficiency, etiology of cardiac valve disease unspecified   5. Hyperlipidemia, mixed      Plan:   In order of problems listed above:  1. Presyncope/Palpitations - She has a history of SVT with ablation in 2007 but has experienced inappropriate sinus tachycardia since and continues to experience intermittent palpitations. She did have a recent presyncopal episode and was orthostatic at that time. I suspect this was secondary to her remaining on a higher dose of Toprol-XL despite a 40 lb intentional weight loss over the past 7 months. Reviewed options including reducing Toprol-XL to 12.25m daily or switching to Bisoprolol and she prefers to try Bisoprolol. Previously did not tolerate CCB. Will therefore stop Toprol-XL and switch to Bisoprolol 2.51mdaily. Will plan for a 2-week Zio patch to rule-out any significant arrhythmias.   2. Dyspnea on Exertion - Possibly due to deconditioning but could have been secondary to chronotropic incompetence when on a higher dose of BB therapy. Will plan for a monitor as outlined above and adjust BB therapy. Would not plan for repeat ischemic testing at this time given her  Coronary Calcium score of 0 in 2019.  3. Aortic Regurgitation - Echocardiogram earlier this month showed mild to moderate aortic valve regurgitation. Would plan for repeat imaging in 1 year.   4. HLD - Followed by her PCP. She has made significant dietary changes over the past 7 months and also remains on Atorvastatin 1067maily.     Medication Adjustments/Labs and Tests Ordered: Current medicines are reviewed at length with the patient today.  Concerns regarding medicines are outlined above.  Medication changes, Labs and Tests ordered today are listed in the Patient Instructions below. Patient Instructions  Medication Instructions:   Stop Taking Toprol XL  Start Taking Bisoprolol 2.5 mg Daily   *If you need a refill on your cardiac medications before your next appointment, please call your pharmacy*   Lab Work: NONE  If you have labs (blood work) drawn today and your tests are completely normal, you will receive your results only by: MyCThompsonf you have MyChart) OR A paper copy in the mail If you have any lab test that is abnormal or we need to change your treatment, we will call you to review the results.   Testing/Procedures: Your physician has requested that you have an echocardiogram. Echocardiography is a painless test that uses sound waves to create images of your heart. It provides your doctor with information about the size and shape of your heart and how well your hearts chambers and valves are working. This procedure takes approximately one hour. There are no  restrictions for this procedure.    Follow-Up: At [facility], you and your health needs are our priority.  As part of our continuing mission to provide you with exceptional heart care, we have created designated Provider Care Teams.  These Care Teams include your primary Cardiologist (physician) and Advanced Practice Providers (APPs -  Physician Assistants and Nurse Practitioners) who all work together  to provide you with the care you need, when you need it.  We recommend signing up for the patient portal called "MyChart".  Sign up information is provided on this After Visit Summary.  MyChart is used to connect with patients for Virtual Visits (Telemedicine).  Patients are able to view lab/test results, encounter notes, upcoming appointments, etc.  Non-urgent messages can be sent to your provider as well.   To learn more about what you can do with MyChart, go to [email or web address].    Your next appointment:   1 year(s)  The format for your next appointment:   In Person  Provider:   Bernerd Pho, PA-C    Other Instructions Thank you for choosing [facility]!      Signed, Erma Heritage, PA-C  01/06/2022 8:15 PM    [facility] [address] [place], [place] [address] Phone: [phone] Fax: [phone]

## 2022-01-06 NOTE — Patient Instructions (Signed)
Medication Instructions:   Stop Taking Toprol XL  Start Taking Bisoprolol 2.5 mg Daily   *If you need a refill on your cardiac medications before your next appointment, please call your pharmacy*   Lab Work: NONE  If you have labs (blood work) drawn today and your tests are completely normal, you will receive your results only by: [place] (if you have MyChart) OR A paper copy in the mail If you have any lab test that is abnormal or we need to change your treatment, we will call you to review the results.   Testing/Procedures: Your physician has requested that you have an echocardiogram. Echocardiography is a painless test that uses sound waves to create images of your heart. It provides your doctor with information about the size and shape of your heart and how well your hearts chambers and valves are working. This procedure takes approximately one hour. There are no restrictions for this procedure.    Follow-Up: At [facility], you and your health needs are our priority.  As part of our continuing mission to provide you with exceptional heart care, we have created designated Provider Care Teams.  These Care Teams include your primary Cardiologist (physician) and Advanced Practice Providers (APPs -  Physician Assistants and Nurse Practitioners) who all work together to provide you with the care you need, when you need it.  We recommend signing up for the patient portal called "MyChart".  Sign up information is provided on this After Visit Summary.  MyChart is used to connect with patients for Virtual Visits (Telemedicine).  Patients are able to view lab/test results, encounter notes, upcoming appointments, etc.  Non-urgent messages can be sent to your provider as well.   To learn more about what you can do with MyChart, go to [email or web address].    Your next appointment:   1 year(s)  The format for your next appointment:   In Person  Provider:   Bernerd Pho,  PA-C    Other Instructions Thank you for choosing [facility]!

## 2022-01-09 ENCOUNTER — Other Ambulatory Visit (HOSPITAL_COMMUNITY): Payer: Self-pay

## 2022-01-22 ENCOUNTER — Encounter (HOSPITAL_BASED_OUTPATIENT_CLINIC_OR_DEPARTMENT_OTHER): Payer: 59 | Admitting: Pulmonary Disease

## 2022-01-25 ENCOUNTER — Other Ambulatory Visit (HOSPITAL_COMMUNITY): Payer: Self-pay

## 2022-01-27 DIAGNOSIS — R55 Syncope and collapse: Secondary | ICD-10-CM | POA: Diagnosis not present

## 2022-01-27 DIAGNOSIS — R002 Palpitations: Secondary | ICD-10-CM | POA: Diagnosis not present

## 2022-02-02 ENCOUNTER — Other Ambulatory Visit: Payer: Self-pay

## 2022-02-02 ENCOUNTER — Ambulatory Visit (HOSPITAL_BASED_OUTPATIENT_CLINIC_OR_DEPARTMENT_OTHER): Payer: 59 | Attending: Pulmonary Disease | Admitting: Pulmonary Disease

## 2022-02-02 DIAGNOSIS — G4733 Obstructive sleep apnea (adult) (pediatric): Secondary | ICD-10-CM | POA: Diagnosis not present

## 2022-02-02 DIAGNOSIS — G4710 Hypersomnia, unspecified: Secondary | ICD-10-CM | POA: Diagnosis not present

## 2022-02-02 DIAGNOSIS — R400 Somnolence: Secondary | ICD-10-CM | POA: Insufficient documentation

## 2022-02-09 ENCOUNTER — Other Ambulatory Visit (HOSPITAL_COMMUNITY): Payer: Self-pay

## 2022-02-13 ENCOUNTER — Telehealth: Payer: Self-pay | Admitting: Pulmonary Disease

## 2022-02-13 NOTE — Procedures (Signed)
POLYSOMNOGRAPHY ? ?Last, First: [name] Jensen, [name] Jensen ?MRN: [id number] ?Gender: Female ?Age (years): 66 ?Weight (lbs): 138 ?DOB: [birth year] ?BMI: 24 ?Primary Care: No PCP ?Epworth Score: 9 ?Referring: Laurin Coder MD ?Technician: Baxter Flattery ?Interpreting: Laurin Coder MD ?Study Type: NPSG ?Ordered Study Type: Split Night CPAP ?Study date: 02/02/2022 ?Location: [facility] ?CLINICAL INFORMATION ?[name] Jensen is a 66 year old Female and was referred to the sleep center for evaluation of G47.80 Other Sleep Disorders. Indications include Snoring. ? ?MEDICATIONS ?Patient self administered medications include: N/A. Medications administered during study include No sleep medicine administered. ? ?SLEEP STUDY TECHNIQUE ?A multi-channel overnight Polysomnography study was performed. The channels recorded and monitored were central and occipital EEG, electrooculogram (EOG), submentalis EMG (chin), nasal and oral airflow, thoracic and abdominal wall motion, anterior tibialis EMG, snore microphone, electrocardiogram, and a pulse oximetry. ?TECHNICIAN COMMENTS ?Comments added by Technician: Patient had difficulty initiating sleep. Patioent did not exhibit enough events to warrant split ?Comments added by Scorer: N/A ?SLEEP ARCHITECTURE ?The study was initiated at 10:47:35 PM and terminated at 4:47:17 AM. The total recorded time was 359.7 minutes. EEG confirmed total sleep time was 322.7 minutes yielding a sleep efficiency of 89.7%%. Sleep onset after lights out was 24.5 minutes with a REM latency of 231.0 minutes. The patient spent 0.9%% of the night in stage N1 sleep, 91.8%% in stage N2 sleep, 0.2%% in stage N3 and 7.1% in REM. Wake after sleep onset (WASO) was 12.5 minutes. The Arousal Index was 0.7/hour. ?RESPIRATORY PARAMETERS ?There were a total of 0 respiratory disturbances out of which 0 were apneas ( 0 obstructive, 0 mixed, 0 central) and 0 hypopneas. The apnea/hypopnea index (AHI) was 0.0 events/hour. The  central sleep apnea index was 0 events/hour. The REM AHI was 0.0 events/hour and NREM AHI was 0.0 events/hour. The supine AHI was 0.0 events/hour and the non supine AHI was 0 supine during 100.00% of sleep. Respiratory disturbances were associated with oxygen desaturation down to a nadir of 89.0% during sleep. The mean oxygen saturation during the study was 91.7%. The cumulative time under 88% oxygen saturation was 5.5 minutes. ? ?LEG MOVEMENT DATA ?The total leg movements were 0 with a resulting leg movement index of 0.0/hr .Associated arousal with leg movement index was 0.0/hr. ? ?CARDIAC DATA ?The underlying cardiac rhythm was most consistent with sinus rhythm. Mean heart rate during sleep was 81.2 bpm. Additional rhythm abnormalities include None. ? ?IMPRESSIONS ?- No Significant Obstructive Sleep apnea(OSA) ?- EKG showed no cardiac abnormalities. ?- No snoring was audible during this study. ?- No significant periodic leg movements(PLMs) during sleep. However, no significant associated arousals. ? ?DIAGNOSIS ?- Negative study for significant obstructive sleep apnea ?- Excessive daytime sleepiness ? ?RECOMMENDATIONS ?- Avoid alcohol, sedatives and other CNS depressants that may worsen sleep apnea and disrupt normal sleep architecture. ?- Sleep hygiene should be reviewed to assess factors that may improve sleep quality. ?- Weight management and regular exercise should be initiated or continued. ? ?[Electronically signed] 02/13/2022 07:03 AM ? ?Sherrilyn Rist MD ?NPI: [id number] ? ?

## 2022-02-13 NOTE — Telephone Encounter (Signed)
Call patient ? ?Sleep study result ? ?Date of study: ?02/02/2022 ? ?Impression: ?Negative study for significant obstructive sleep apnea ? ?Recommendation: ? ?Optimize sleep hygiene ? ?6 to 8 hours of sleep recommended ? ?Continue weight loss efforts and maintain regular exercise schedule ? ?Follow-up as needed ? ?

## 2022-02-14 NOTE — Telephone Encounter (Signed)
I called the patient and she reports that she has seen the results and she did not have any questions. Nothing further needed.  ?

## 2022-02-15 ENCOUNTER — Ambulatory Visit: Payer: 59 | Admitting: Gastroenterology

## 2022-02-15 ENCOUNTER — Encounter: Payer: Self-pay | Admitting: Gastroenterology

## 2022-02-15 ENCOUNTER — Other Ambulatory Visit (HOSPITAL_COMMUNITY): Payer: Self-pay

## 2022-02-15 VITALS — BP 100/68 | HR 100 | Ht 64.0 in | Wt 136.8 lb

## 2022-02-15 DIAGNOSIS — K219 Gastro-esophageal reflux disease without esophagitis: Secondary | ICD-10-CM

## 2022-02-15 DIAGNOSIS — K5900 Constipation, unspecified: Secondary | ICD-10-CM | POA: Diagnosis not present

## 2022-02-15 DIAGNOSIS — K3184 Gastroparesis: Secondary | ICD-10-CM | POA: Diagnosis not present

## 2022-02-15 MED ORDER — LINACLOTIDE 72 MCG PO CAPS
72.0000 ug | ORAL_CAPSULE | Freq: Every day | ORAL | 11 refills | Status: DC
  Filled 2022-02-15: qty 30, 30d supply, fill #0

## 2022-02-15 NOTE — Progress Notes (Signed)
? ? ?  History of Present Illness: This is a 66 year old female returning for follow-up of constipation and gastroparesis.  With substantial diet adjustments she is controlling her gastroparesis symptoms quite well.  Her reflux symptoms have substantially improved and she currently takes Nexium 2-3 times per week.  Her constipation has been difficult to manage.  MiraLAX has been effective for her but she cannot tolerate the taste. ? ?Current Medications, Allergies, Past Medical History, Past Surgical History, Family History and Social History were reviewed in [employer] record. ? ? ?Physical Exam: ?General: Well developed, well nourished, no acute distress ?Head: Normocephalic and atraumatic ?Eyes: Sclerae anicteric, EOMI ?Ears: Normal auditory acuity ?Mouth: Not examined, mask on during Covid-19 pandemic ?Lungs: Clear throughout to auscultation ?Heart: Regular rate and rhythm; no murmurs, rubs or bruits ?Abdomen: Soft, non tender and non distended. No masses, hepatosplenomegaly or hernias noted. Normal Bowel sounds ?Rectal: Not done ?Musculoskeletal: Symmetrical with no gross deformities  ?Pulses:  Normal pulses noted ?Extremities: No clubbing, cyanosis, edema or deformities noted ?Neurological: Alert oriented x 4, grossly nonfocal ?Psychological:  Alert and cooperative. Normal mood and affect ? ? ?Assessment and Recommendations: ? 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  Trial of Linzess 72 mcg daily. She is advised to call if this is not effective and we will increasing to 145 mcg qd.  If Linzess is not preferred by or approved by her insurance company then trial of Trulance 3 mg daily. REV in 2 months.  ?Gastroparesis and GERD.  Continue gastroparesis diet.  Follow antireflux measures.  Nexium 40 mg daily as needed. ?Personal history of one small adenomatous colon polyp in 2020.  A 7-year interval surveillance colonoscopy is recommended in March 2027. ?

## 2022-02-15 NOTE — Patient Instructions (Signed)
Please start Linzess samples taking one capsule by mouth daily before breakfast. ? ?We have also sent a prescription to your pharmacy.  ? ?Please call our office back in a couple weeks if you do not see improvement in your constipation.  ? ?Follow up with Dr. Fuller Plan in 2 months.  ? ?The [facility] providers would like to encourage you to use [facility] to communicate with providers for non-urgent requests or questions.  Due to long hold times on the telephone, sending your provider a message by [facility] may be a faster and more efficient way to get a response.  Please allow 48 business hours for a response.  Please remember that this is for non-urgent requests.  ? ?Thank you for choosing me and [facility]. ? ?Malcolm T. Dagoberto Ligas., MD., [facility] ? ?

## 2022-02-23 NOTE — Progress Notes (Signed)
? ?Subjective:  ? ? Patient ID: [name] Jensen, female    DOB: February 02, 1956, 66 y.o.   MRN: [id number] ? ?Chief Complaint  ?Patient presents with  ? Follow-up  ? ? ?HPI ?Patient is in today for a 3 month follow up. Overall she is doing well. No recent febrile illness or hospitalizations. She is noting good weight loss and she feels that is helping her anxiety and depression. Her concern is she is having episodes of her systolic bp dropping below 100 and when she arises she feels dizzy and lightheaded. She is trying to hydrate and is working with cardiology to try and control her tachycardia with minimal meds. Denies CP/SOB/HA/congestion/fevers/GI or GU c/o. Taking meds as prescribed. Sometimes her pulse still spikes above 140 ? ?Past Medical History:  ?Diagnosis Date  ? Adjustment reaction with anxiety and depression 06/12/2014  ? Anemia   ? h/o low ferritin  ? Anxiety   ? Asthma   ? environmental triggers  ? Chicken pox as a child  ? Cough 08/22/2015  ? Depression   ? Depression with anxiety 06/12/2014  ? Diverticulosis   ? Ehrlichiosis 0923  ? Gallstones   ? GERD (gastroesophageal reflux disease)   ? Hair loss 03/28/2017  ? Hiatal hernia 2007  ? History of viral illness   ? Hyperlipidemia, mixed 04/11/2015  ? Insomnia 08/21/2017  ? Lactose intolerance 06/12/2014  ? Mumps as a child  ? Overweight 08/19/2014  ? Rectocele 06/12/2014  ? RLS (restless legs syndrome)   ? 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 Right arm removed 2011  ? Sessile colonic polyp   ? SVT (supraventricular tachycardia) ([place])   ? a. ablation in 05/2006  ? Tachycardia 06/12/2014  ? Thyroid disease   ? Vitamin D deficiency 08/22/2015  ? ? ?Past Surgical History:  ?Procedure Laterality Date  ? ABDOMINAL HYSTERECTOMY  11-2008  ? total with cystocele, rectocele repair  ? APPENDECTOMY    ? CHOLECYSTECTOMY    ? COLONOSCOPY  2006  ? diverticulosis, hemorrhoids ([place])  ? ESOPHAGOGASTRODUODENOSCOPY  2006  ? small hiatus hernia ([place])  ?  ESOPHAGOGASTRODUODENOSCOPY N/A 02/21/2018  ? Procedure: ESOPHAGOGASTRODUODENOSCOPY (EGD);  Surgeon: Rogene Houston, MD;  Location: [facility];  Service: Endoscopy;  Laterality: N/A;  pt knows to arrive at 2:45  ? INCONTINENCE SURGERY    ? SKIN SURGERY    ? right arm scc  ? svt ablation    ? TONSILLECTOMY  1972  ? 2ith adenoid  ? WISDOM TOOTH EXTRACTION  66 yrs old  ? ? ?Family History  ?Problem Relation Age of Onset  ? Hypertension Mother   ? Hypertension Father   ? Hypertension Sister   ? Diabetes Sister   ?     type 2  ? Fibromyalgia Sister   ? Proteinuria Sister   ? GI Bleed Maternal Grandmother   ? Atrial fibrillation Maternal Grandmother   ? Heart disease Maternal Grandfather   ? Diabetes Paternal Grandmother   ? Stroke Paternal Grandfather   ? Colon cancer Neg Hx   ? Esophageal cancer Neg Hx   ? Rectal cancer Neg Hx   ? Stomach cancer Neg Hx   ? ? ?Social History  ? ?Socioeconomic History  ? Marital status: Divorced  ?  Spouse name: Not on file  ? Number of children: 2  ? Years of education: Not on file  ? Highest education level: Not on file  ?Occupational History  ? Occupation: Therapist, sports  ?  Employer: [employer]  ?Tobacco Use  ? Smoking status: Never  ? Smokeless tobacco: Never  ?Vaping Use  ? Vaping Use: Never used  ?Substance and Sexual Activity  ? Alcohol use: No  ?  Alcohol/week: 0.0 standard drinks  ? Drug use: No  ? Sexual activity: Not Currently  ?  Comment: lives by self with 4 dogs works at [employer], avoids dairy  ?Other Topics Concern  ? Not on file  ?Social History Narrative  ? Not on file  ? ?Social Determinants of Health  ? ?Financial Resource Strain: Not on file  ?Food Insecurity: Not on file  ?Transportation Needs: Not on file  ?Physical Activity: Not on file  ?Stress: Not on file  ?Social Connections: Not on file  ?Intimate Partner Violence: Not on file  ? ? ?Outpatient Medications Prior to Visit  ?Medication Sig Dispense Refill  ? atorvastatin (LIPITOR) 10 MG tablet TAKE 1 TABLET BY MOUTH  DAILY 90 tablet 1  ? bisoprolol (ZEBETA) 5 MG tablet Take 1/2 tablet (2.5 mg total) by mouth daily.  Replaces Metoprolol. 45 tablet 3  ? blood glucose meter kit and supplies KIT Dispense based on patient and insurance preference. Use up to four times daily as directed. (FOR ICD-9 250.00, 250.01). 1 each 0  ? Cholecalciferol (VITAMIN D3) 5000 units CAPS Take 5,000 Units by mouth daily.     ? esomeprazole (NEXIUM) 40 MG capsule Take 1 capsule (40 mg total) by mouth daily. 60 capsule 11  ? linaclotide (LINZESS) 72 MCG capsule Take 1 capsule (72 mcg total) by mouth daily before breakfast. 30 capsule 11  ? polyethylene glycol (MIRALAX / GLYCOLAX) 17 g packet Take 17 g by mouth daily as needed.    ? temazepam (RESTORIL) 30 MG capsule TAKE 1 CAPSULE BY MOUTH ONCE DAILY AT BEDTIME 30 capsule 5  ? traZODone (DESYREL) 100 MG tablet Take 2 tablets (200 mg total) by mouth at bedtime. 180 tablet 1  ? tretinoin (RETIN-A) 0.05 % cream Apply to the face at bedtime 45 g 3  ? tirzepatide (MOUNJARO) 12.5 MG/0.5ML Pen Inject 12.5 mg into the skin once a week. 6 mL 0  ? tirzepatide (MOUNJARO) 15 MG/0.5ML Pen Inject 15 mg into the skin once a week. 6 mL 1  ? colesevelam (WELCHOL) 625 MG tablet TAKE 3 TABLETS BY MOUTH TWICE DAILY WITH MEALS 180 tablet 0  ? ?No facility-administered medications prior to visit.  ? ? ?Allergies  ?Allergen Reactions  ? Dilaudid [Hydromorphone Hcl] Other (See Comments)  ?  Respiratory , cardiac   ? ? ?Review of Systems  ?Constitutional:  Positive for malaise/fatigue. Negative for fever.  ?HENT:  Negative for congestion.   ?Eyes:  Negative for blurred vision.  ?Respiratory:  Negative for shortness of breath.   ?Cardiovascular:  Negative for chest pain, palpitations and leg swelling.  ?Gastrointestinal:  Negative for abdominal pain, blood in stool and nausea.  ?Genitourinary:  Negative for dysuria and frequency.  ?Musculoskeletal:  Negative for falls.  ?Skin:  Negative for rash.  ?Neurological:  Positive for  dizziness. Negative for loss of consciousness and headaches.  ?Endo/Heme/Allergies:  Negative for environmental allergies.  ?Psychiatric/Behavioral:  Negative for depression. The patient is nervous/anxious.   ? ?   ?Objective:  ?  ?Physical Exam ?Constitutional:   ?   General: She is not in acute distress. ?   Appearance: She is well-developed.  ?HENT:  ?   Head: Normocephalic and atraumatic.  ?Eyes:  ?   Conjunctiva/sclera:  Conjunctivae normal.  ?Neck:  ?   Thyroid: No thyromegaly.  ?Cardiovascular:  ?   Rate and Rhythm: Normal rate and regular rhythm.  ?   Heart sounds: Normal heart sounds. No murmur heard. ?Pulmonary:  ?   Effort: Pulmonary effort is normal. No respiratory distress.  ?   Breath sounds: Normal breath sounds.  ?Abdominal:  ?   General: Bowel sounds are normal. There is no distension.  ?   Palpations: Abdomen is soft. There is no mass.  ?   Tenderness: There is no abdominal tenderness.  ?Musculoskeletal:  ?   Cervical back: Neck supple.  ?Lymphadenopathy:  ?   Cervical: No cervical adenopathy.  ?Skin: ?   General: Skin is warm and dry.  ?Neurological:  ?   Mental Status: She is alert and oriented to person, place, and time.  ?Psychiatric:     ?   Behavior: Behavior normal.  ? ? ?BP 98/70 (BP Location: Left Arm, Patient Position: Sitting, Cuff Size: Normal)   Pulse 95   Resp 20   Ht $R'5\' 4"'eT$  (1.626 m)   Wt 136 lb (61.7 kg)   LMP  (LMP Unknown)   SpO2 98%   BMI 23.34 kg/m?  ?Wt Readings from Last 3 Encounters:  ?02/24/22 136 lb (61.7 kg)  ?02/15/22 136 lb 12.8 oz (62.1 kg)  ?02/02/22 138 lb (62.6 kg)  ? ? ?Diabetic Foot Exam - Simple   ?No data filed ?  ? ?Lab Results  ?Component Value Date  ? WBC 6.8 02/24/2022  ? HGB 13.0 02/24/2022  ? HCT 39.0 02/24/2022  ? PLT 327 02/24/2022  ? GLUCOSE 99 02/24/2022  ? CHOL 127 02/24/2022  ? TRIG 84 02/24/2022  ? HDL 53 02/24/2022  ? LDLDIRECT 72.0 04/26/2020  ? [place] 58 02/24/2022  ? ALT 16 02/24/2022  ? AST 16 02/24/2022  ? NA 144 02/24/2022  ? K 4.8  02/24/2022  ? CL 108 02/24/2022  ? CREATININE 0.82 02/24/2022  ? BUN 13 02/24/2022  ? CO2 17 (L) 02/24/2022  ? TSH 2.03 02/24/2022  ? INR 1.0 11/09/2008  ? HGBA1C 5.3 02/24/2022  ? MICROALBUR 3.8 02/24/2022  ? ? ?Lab Results

## 2022-02-24 ENCOUNTER — Other Ambulatory Visit (HOSPITAL_COMMUNITY): Payer: Self-pay

## 2022-02-24 ENCOUNTER — Ambulatory Visit: Payer: 59 | Admitting: Family Medicine

## 2022-02-24 ENCOUNTER — Encounter: Payer: Self-pay | Admitting: Family Medicine

## 2022-02-24 VITALS — BP 98/70 | HR 95 | Resp 20 | Ht 64.0 in | Wt 136.0 lb

## 2022-02-24 DIAGNOSIS — E1165 Type 2 diabetes mellitus with hyperglycemia: Secondary | ICD-10-CM | POA: Diagnosis not present

## 2022-02-24 DIAGNOSIS — R739 Hyperglycemia, unspecified: Secondary | ICD-10-CM

## 2022-02-24 DIAGNOSIS — D649 Anemia, unspecified: Secondary | ICD-10-CM | POA: Diagnosis not present

## 2022-02-24 DIAGNOSIS — K5900 Constipation, unspecified: Secondary | ICD-10-CM

## 2022-02-24 DIAGNOSIS — F418 Other specified anxiety disorders: Secondary | ICD-10-CM

## 2022-02-24 DIAGNOSIS — Z0000 Encounter for general adult medical examination without abnormal findings: Secondary | ICD-10-CM

## 2022-02-24 DIAGNOSIS — R519 Headache, unspecified: Secondary | ICD-10-CM | POA: Diagnosis not present

## 2022-02-24 DIAGNOSIS — G4700 Insomnia, unspecified: Secondary | ICD-10-CM

## 2022-02-24 DIAGNOSIS — R000 Tachycardia, unspecified: Secondary | ICD-10-CM

## 2022-02-24 DIAGNOSIS — E559 Vitamin D deficiency, unspecified: Secondary | ICD-10-CM

## 2022-02-24 DIAGNOSIS — G4733 Obstructive sleep apnea (adult) (pediatric): Secondary | ICD-10-CM

## 2022-02-24 DIAGNOSIS — E782 Mixed hyperlipidemia: Secondary | ICD-10-CM | POA: Diagnosis not present

## 2022-02-24 DIAGNOSIS — E8881 Metabolic syndrome: Secondary | ICD-10-CM

## 2022-02-24 DIAGNOSIS — I951 Orthostatic hypotension: Secondary | ICD-10-CM

## 2022-02-24 MED ORDER — TIRZEPATIDE 12.5 MG/0.5ML ~~LOC~~ SOAJ
12.5000 mg | SUBCUTANEOUS | 1 refills | Status: DC
  Filled 2022-02-24: qty 6, 84d supply, fill #0
  Filled 2022-04-14 – 2022-05-03 (×2): qty 6, 84d supply, fill #1

## 2022-02-24 NOTE — Patient Instructions (Signed)
Hypotension °As your heart beats, it forces blood through your body. Hypotension, commonly called low blood pressure, is when the force of blood pumping through your arteries is too weak. Arteries are blood vessels that carry blood from the heart throughout the body. Depending on the cause and severity, hypotension may be harmless (benign) or may cause serious problems (be critical). °When blood pressure is too low, you may not get enough blood to your brain or to the rest of your organs. This can cause weakness, light-headedness, rapid heartbeat, and fainting. °What are the causes? °This condition may be caused by: °Blood loss. °Loss of body fluids (dehydration). °Heart problems. °Hormone (endocrine) problems. °Pregnancy. °Severe infection. °Lack of certain nutrients. °Severe allergic reactions (anaphylaxis). °Certain medicines, such as blood pressure medicine or medicines that make the body lose excess fluids (diuretics). Sometimes, hypotension may be caused by not taking medicine as directed, such as taking too much of a certain medicine. °What increases the risk? °The following factors may make you more likely to develop this condition: °Age. Risk increases as you get older. °Conditions that affect the heart or the central nervous system. °Taking certain medicines, such as blood pressure medicine or diuretics. °Being pregnant. °What are the signs or symptoms? °Common symptoms of this condition include: °Weakness. °Light-headedness. °Dizziness. °Blurred vision. °Fatigue. °Rapid heartbeat. °Fainting, in severe cases. °How is this diagnosed? °This condition is diagnosed based on: °Your medical history. °Your symptoms. °Your blood pressure measurement. Your health care provider will check your blood pressure when you are: °Lying down. °Sitting. °Standing. °A blood pressure reading is recorded as two numbers, such as "120 over 80" (or 120/80). The first ("top") number is called the systolic pressure. It is a measure  of the pressure in your arteries as your heart beats. The second ("bottom") number is called the diastolic pressure. It is a measure of the pressure in your arteries when your heart relaxes between beats. Blood pressure is measured in a unit called mm Hg. Healthy blood pressure for most adults is 120/80. If your blood pressure is below 90/60, you may be diagnosed with hypotension. °Other information or tests that may be used to diagnose hypotension include: °Your other vital signs, such as your heart rate and temperature. °Blood tests. °Tilt table test. For this test, you will be safely secured to a table that moves you from a lying position to an upright position. Your heart rhythm and blood pressure will be monitored during the test. °How is this treated? °Treatment for this condition may include: °Changing your diet. This may involve eating more salt (sodium) or drinking more water. °Taking medicines to raise your blood pressure. °Changing the dosage of certain medicines you are taking that might be lowering your blood pressure. °Wearing compression stockings. These stockings help to prevent blood clots and reduce swelling in your legs. °In some cases, you may need to go to the hospital for: °Fluid replacement. This means you will receive fluids through an IV. °Blood replacement. This means you will receive donated blood through an IV (transfusion). °Treating an infection or heart problems, if this applies. °Monitoring. You may need to be monitored while medicines that you are taking wear off. °Follow these instructions at home: °Eating and drinking ° °Drink enough fluid to keep your urine pale yellow. °Eat a healthy diet, and follow instructions from your health care provider about eating or drinking restrictions. A healthy diet includes: °Fresh fruits and vegetables. °Whole grains. °Lean meats. °Low-fat dairy products. °Eat extra salt   only as directed. Do not add extra salt to your diet unless your health care  provider told you to do that. °Eat frequent, small meals. °Avoid standing up suddenly after eating. °Medicines °Take over-the-counter and prescription medicines only as told by your health care provider. °Follow instructions from your health care provider about changing the dosage of your current medicines, if this applies. °Do not stop or adjust any of your medicines on your own. °General instructions ° °Wear compression stockings as told by your health care provider. °Get up slowly from lying down or sitting positions. This gives your blood pressure a chance to adjust. °Avoid hot showers and excessive heat as directed by your health care provider. °Return to your normal activities as told by your health care provider. Ask your health care provider what activities are safe for you. °Do not use any products that contain nicotine or tobacco, such as cigarettes, e-cigarettes, and chewing tobacco. If you need help quitting, ask your health care provider. °Keep all follow-up visits as told by your health care provider. This is important. °Contact a health care provider if you: °Vomit. °Have diarrhea. °Have a fever for more than 2-3 days. °Feel more thirsty than usual. °Feel weak and tired. °Get help right away if you: °Have chest pain. °Have a fast or irregular heartbeat. °Develop numbness in any part of your body. °Cannot move your arms or your legs. °Have trouble speaking. °Become sweaty or feel light-headed. °Faint. °Feel short of breath. °Have trouble staying awake. °Feel confused. °Summary °Hypotension is when the force of blood pumping through your arteries is too weak. °Hypotension may be harmless (benign) or may cause serious problems (be critical). °Treatment for this condition may include changing your diet, changing your medicines, and wearing compression stockings. °In some cases, you may need to go to the hospital for fluid or blood replacement. °This information is not intended to replace advice given to  you by your health care provider. Make sure you discuss any questions you have with your health care provider. °Document Revised: 05/16/2018 Document Reviewed: 05/16/2018 °Elsevier Patient Education © 2022 [employer] ° °

## 2022-02-25 DIAGNOSIS — I959 Hypotension, unspecified: Secondary | ICD-10-CM | POA: Insufficient documentation

## 2022-02-25 LAB — MICROALBUMIN, URINE: Microalb, Ur: 3.8 mg/dL

## 2022-02-25 LAB — LIPID PANEL
Cholesterol: 127 mg/dL (ref <200)
HDL: 53 mg/dL (ref >=50)
LDL Cholesterol (Calc): 58 mg/dL (calc)
Non-HDL Cholesterol (Calc): 74 mg/dL (calc) (ref <130)
Total CHOL/HDL Ratio: 2.4 (calc) (ref <5.0)
Triglycerides: 84 mg/dL (ref <150)

## 2022-02-25 NOTE — Assessment & Plan Note (Signed)
Tolerating statin, encouraged heart healthy diet, avoid trans fats, minimize simple carbs and saturated fats. Increase exercise as tolerated 

## 2022-02-25 NOTE — Assessment & Plan Note (Signed)
Encouraged good sleep hygiene such as dark, quiet room. No blue/green glowing lights such as computer screens in bedroom. No alcohol or stimulants in evening. Cut down on caffeine as able. Regular exercise is helpful but not just prior to bed time.  

## 2022-02-25 NOTE — Assessment & Plan Note (Signed)
They have switched her betablocker around and she is still having some trouble with her tachycardia. Will follow up with cardiology ?

## 2022-02-25 NOTE — Assessment & Plan Note (Signed)
With her weight loss she has been having some trouble with er blood pressure dropping especially upon arising. She needs her beta blockade for her tachycardia so she will follow up with cardiology but is advised to continue good hydration and to add an electrolyte beverage daily that does not contain artificial sweeteners.  ?

## 2022-02-25 NOTE — Assessment & Plan Note (Addendum)
hgba1c acceptable, minimize simple carbs. Increase exercise as tolerated. A1C and weight are down. Will titrate down the Mounjaro to 12.5 mg dose and see how she does. Can titrate down further if she tolerates. ?

## 2022-02-25 NOTE — Assessment & Plan Note (Signed)
She notes her anxiety and depression are some improved with her current weight loss.  ?

## 2022-02-27 ENCOUNTER — Encounter: Payer: Self-pay | Admitting: Family Medicine

## 2022-02-27 LAB — COMPREHENSIVE METABOLIC PANEL
AG Ratio: 1.9 (calc) (ref 1.0–2.5)
ALT: 16 U/L (ref 6–29)
AST: 16 U/L (ref 10–35)
Albumin: 4.6 g/dL (ref 3.6–5.1)
Alkaline phosphatase (APISO): 59 U/L (ref 37–153)
BUN: 13 mg/dL (ref 7–25)
CO2: 17 mmol/L — ABNORMAL LOW (ref 20–32)
Calcium: 9.7 mg/dL (ref 8.6–10.4)
Chloride: 108 mmol/L (ref 98–110)
Creat: 0.82 mg/dL (ref 0.50–1.05)
Globulin: 2.4 g/dL (calc) (ref 1.9–3.7)
Glucose, Bld: 99 mg/dL (ref 65–99)
Potassium: 4.8 mmol/L (ref 3.5–5.3)
Sodium: 144 mmol/L (ref 135–146)
Total Bilirubin: 0.5 mg/dL (ref 0.2–1.2)
Total Protein: 7 g/dL (ref 6.1–8.1)

## 2022-02-27 LAB — CBC
HCT: 39 % (ref 35.0–45.0)
Hemoglobin: 13 g/dL (ref 11.7–15.5)
MCH: 28.4 pg (ref 27.0–33.0)
MCHC: 33.3 g/dL (ref 32.0–36.0)
MCV: 85.2 fL (ref 80.0–100.0)
MPV: 10.3 fL (ref 7.5–12.5)
Platelets: 327 10*3/uL (ref 140–400)
RBC: 4.58 10*6/uL (ref 3.80–5.10)
RDW: 12.9 % (ref 11.0–15.0)
WBC: 6.8 10*3/uL (ref 3.8–10.8)

## 2022-02-27 LAB — TSH: TSH: 2.03 mIU/L (ref 0.40–4.50)

## 2022-02-27 LAB — VITAMIN D 25 HYDROXY (VIT D DEFICIENCY, FRACTURES): Vit D, 25-Hydroxy: 80 ng/mL (ref 30–100)

## 2022-02-27 LAB — HEMOGLOBIN A1C
Hgb A1c MFr Bld: 5.3 % of total Hgb (ref <5.7)
Mean Plasma Glucose: 105 mg/dL
eAG (mmol/L): 5.8 mmol/L

## 2022-02-27 LAB — INSULIN, RANDOM: Insulin: 12.4 u[IU]/mL

## 2022-03-02 ENCOUNTER — Ambulatory Visit: Payer: 59 | Admitting: Family Medicine

## 2022-03-13 ENCOUNTER — Other Ambulatory Visit: Payer: Self-pay | Admitting: Family Medicine

## 2022-03-14 ENCOUNTER — Other Ambulatory Visit (HOSPITAL_COMMUNITY): Payer: Self-pay

## 2022-03-14 MED ORDER — TEMAZEPAM 30 MG PO CAPS
ORAL_CAPSULE | Freq: Every day | ORAL | 5 refills | Status: DC
  Filled 2022-03-14: qty 30, 30d supply, fill #0
  Filled 2022-04-19: qty 30, 30d supply, fill #1
  Filled 2022-05-19: qty 30, 30d supply, fill #2
  Filled 2022-06-20: qty 30, 30d supply, fill #3
  Filled 2022-07-16: qty 30, 30d supply, fill #4
  Filled 2022-07-24 – 2022-08-15 (×2): qty 30, 30d supply, fill #5

## 2022-03-14 NOTE — Telephone Encounter (Signed)
Requesting: Restoril '30MG'$  ?Contract: none  ?UDS: none ?Last Visit: 02/24/22 ?Next Visit: 06/20/22 ?Last Refill: 08/18/21 #30, 5 refills ? ?Please Advise  ?

## 2022-03-15 ENCOUNTER — Ambulatory Visit: Payer: 59 | Admitting: Pulmonary Disease

## 2022-03-21 NOTE — Progress Notes (Signed)
? ?  [name] Jensen 1956-07-28 [id number] ? ? ?History: Postmenopausal 66 y.o. presents for annual exam. S/P TAH with A&P repair 2013 ? ? ?Gynecologic History ?Postmenopausal ?Last Pap: 2012 ?Last mammogram: 11/2021. Results were: normal ?Last colonoscopy: 2020 ?HRT use: never ? ?Obstetric History ?OB History  ?Gravida Para Term Preterm AB Living  ?'3 2     1 2  '$ ?SAB IAB Ectopic Multiple Live Births  ?1       2  ?  ?# Outcome Date GA Lbr Len/2nd Weight Sex Delivery Anes PTL Lv  ?3 SAB           ?2 Para           ?1 Para           ? ? ? ?The following portions of the patient's history were reviewed and updated as appropriate: allergies, current medications, past family history, past medical history, past social history, past surgical history, and problem list. ? ?Review of Systems ?Pertinent items noted in HPI and remainder of comprehensive ROS otherwise negative.  ?Past medical history, past surgical history, family history and social history were all reviewed and documented in the EPIC chart. ? ?Exam: ? ?Vitals:  ? 03/22/22 1347  ?BP: 104/66  ?Weight: 132 lb (59.9 kg)  ?Height: '5\' 4"'$  (1.626 m)  ? ?Body mass index is 22.66 kg/m?. ? ?General appearance:  Normal ?Thyroid:  Symmetrical, normal in size, without palpable masses or nodularity. ?Respiratory ? Auscultation:  Clear without wheezing or rhonchi ?Cardiovascular ? Auscultation:  Regular rate, without rubs, murmurs or gallops ? Edema/varicosities:  Not grossly evident ?Abdominal ? Soft,nontender, without masses, guarding or rebound. ? Liver/spleen:  No organomegaly noted ? Hernia:  None appreciated ? Skin ? Inspection:  Grossly normal ?Breasts: Examined lying and sitting.  ? Right: Without masses, retractions, nipple discharge or axillary adenopathy. ? ? Left: Without masses, retractions, nipple discharge or axillary adenopathy. ?Genitourinary  ? Inguinal/mons:  Normal without inguinal adenopathy ? External genitalia:  Normal appearing vulva with no masses,  tenderness, or lesions ? BUS/Urethra/Skene's glands:  Normal ? Vagina:  Normal appearing with normal color and discharge, no lesions. Atrophy: moderate  ? Cervix:  Normal appearing without discharge or lesions ? Uterus:  Normal in size, shape and contour.  Midline and mobile, nontender ? Adnexa/parametria:  absent ? Anus and perineum: Normal ?  ? ?Patient informed chaperone available to be present for breast and pelvic exam. Patient has requested no chaperone to be present. Patient has been advised what will be completed during breast and pelvic exam.  ? ?Assessment/Plan:   ?1. Well woman exam with routine gynecological exam ?Paps d/c'd ? ?2. Menopause syndrome ?Clear with cardiology to start estrogen patch (works with them) ?On Beta blocker for SVT ?- estradiol (VIVELLE-DOT) 0.05 MG/24HR patch; Place 1 patch (0.05 mg total) onto the skin 2 (two) times a week.  Dispense: 24 patch; Refill: 4 ? ?3. Genitourinary syndrome of menopause ? ?- Estradiol (IMVEXXY MAINTENANCE PACK) 10 MCG INST; Place 1 capsule vaginally 2 (two) times a week.  Dispense: 8 each; Refill: 11 ?- DG Bone Density; Future ? ?4. Osteoporosis screening ?Schedule DEXA at [facility]  ? ?Discussed SBE, colonoscopy and DEXA screening as directed. Recommend 123mns of exercise weekly, including weight bearing exercise. Encouraged the use of seatbelts and sunscreen.  ?Return in 1 year for annual or sooner prn. ? ?CKerry DoryWHNP-BC, 2:41 PM 03/22/2022  ?

## 2022-03-22 ENCOUNTER — Encounter: Payer: Self-pay | Admitting: Radiology

## 2022-03-22 ENCOUNTER — Other Ambulatory Visit (HOSPITAL_COMMUNITY): Payer: Self-pay

## 2022-03-22 ENCOUNTER — Ambulatory Visit (INDEPENDENT_AMBULATORY_CARE_PROVIDER_SITE_OTHER): Payer: 59 | Admitting: Radiology

## 2022-03-22 VITALS — BP 104/66 | Ht 64.0 in | Wt 132.0 lb

## 2022-03-22 DIAGNOSIS — Z01419 Encounter for gynecological examination (general) (routine) without abnormal findings: Secondary | ICD-10-CM | POA: Diagnosis not present

## 2022-03-22 DIAGNOSIS — Z13820 Encounter for screening for osteoporosis: Secondary | ICD-10-CM | POA: Diagnosis not present

## 2022-03-22 DIAGNOSIS — N951 Menopausal and female climacteric states: Secondary | ICD-10-CM | POA: Diagnosis not present

## 2022-03-22 DIAGNOSIS — N958 Other specified menopausal and perimenopausal disorders: Secondary | ICD-10-CM | POA: Diagnosis not present

## 2022-03-22 MED ORDER — ESTRADIOL 0.05 MG/24HR TD PTTW
1.0000 | MEDICATED_PATCH | TRANSDERMAL | 4 refills | Status: DC
  Filled 2022-03-22: qty 24, 84d supply, fill #0

## 2022-03-22 MED ORDER — IMVEXXY MAINTENANCE PACK 10 MCG VA INST: 1.0000 | VAGINAL_INSERT | VAGINAL | 11 refills | Status: DC

## 2022-03-27 ENCOUNTER — Other Ambulatory Visit (HOSPITAL_COMMUNITY): Payer: Self-pay

## 2022-04-05 ENCOUNTER — Ambulatory Visit: Payer: 59 | Admitting: Pulmonary Disease

## 2022-04-05 ENCOUNTER — Other Ambulatory Visit (HOSPITAL_COMMUNITY): Payer: Self-pay

## 2022-04-05 ENCOUNTER — Other Ambulatory Visit: Payer: Self-pay | Admitting: Family Medicine

## 2022-04-05 ENCOUNTER — Encounter: Payer: Self-pay | Admitting: Pulmonary Disease

## 2022-04-05 VITALS — BP 104/60 | HR 85 | Temp 97.8°F | Ht 64.0 in | Wt 130.0 lb

## 2022-04-05 DIAGNOSIS — G4719 Other hypersomnia: Secondary | ICD-10-CM

## 2022-04-05 MED ORDER — ZOLPIDEM TARTRATE ER 6.25 MG PO TBCR
6.2500 mg | EXTENDED_RELEASE_TABLET | Freq: Every evening | ORAL | 2 refills | Status: DC | PRN
  Filled 2022-04-05: qty 30, 30d supply, fill #0

## 2022-04-05 NOTE — Patient Instructions (Signed)
Prescription for Ambien will be sent into pharmacy for you to try to help consolidate sleep ? ?I will see you in about 3 months ? ?We can increase the dose if it is helping ? ?Stop the medication if you feel it is making you feel worse or making you feel groggy waking up in the morning ?

## 2022-04-06 ENCOUNTER — Other Ambulatory Visit (HOSPITAL_COMMUNITY): Payer: Self-pay

## 2022-04-06 MED ORDER — TRAZODONE HCL 100 MG PO TABS
200.0000 mg | ORAL_TABLET | Freq: Every day | ORAL | 1 refills | Status: DC
  Filled 2022-04-06: qty 180, 90d supply, fill #0
  Filled 2022-07-16: qty 180, 90d supply, fill #1

## 2022-04-06 NOTE — Progress Notes (Signed)
? ?      ?[name] Jensen    [id number]    06/21/1956 ? ?Primary Care Physician:Blyth, Bonnita Levan, MD ? ?Referring Physician: [name], MD ?[address] ?[address] ?[place],  [place] [address] ? ?Chief complaint:   ?Concern for obstructive sleep apnea after she followed up with her dentist ? ?HPI: ? ?She had a recent sleep study that was negative for significant sleep apnea ?Still has significant issues with her sleep at night ? ?Previous sleep study from 2016 did reveal mild obstructive sleep apnea and had a study that was done about 10 years ago was inconclusive ? ?Wakes up in the morning with a dry mouth, parched lips, sometimes awakens from sleep with tachycardia ?-Does have a history of tachycardia ?Daytime fatigue ? ?Dad snored ? ?Memory is fine ? ?Usually goes to bed about 10 PM, takes about 30 minutes to fall asleep, about 2 awakenings ?Final wake up time about 5:30 AM ? ?She is down about 45 pounds recently ? ? ?Outpatient Encounter Medications as of 04/05/2022  ?Medication Sig  ? atorvastatin (LIPITOR) 10 MG tablet TAKE 1 TABLET BY MOUTH DAILY  ? bisoprolol (ZEBETA) 5 MG tablet Take 1/2 tablet (2.5 mg total) by mouth daily.  Replaces Metoprolol.  ? blood glucose meter kit and supplies KIT Dispense based on patient and insurance preference. Use up to four times daily as directed. (FOR ICD-9 250.00, 250.01).  ? Cholecalciferol (VITAMIN D3) 5000 units CAPS Take 5,000 Units by mouth daily.   ? esomeprazole (NEXIUM) 40 MG capsule Take 1 capsule (40 mg total) by mouth daily.  ? Estradiol (IMVEXXY MAINTENANCE PACK) 10 MCG INST Place 1 capsule vaginally 2 (two) times a week.  ? estradiol (VIVELLE-DOT) 0.05 MG/24HR patch Place 1 patch (0.05 mg total) onto the skin 2 (two) times a week.  ? linaclotide (LINZESS) 72 MCG capsule Take 1 capsule (72 mcg total) by mouth daily before breakfast.  ? polyethylene glycol (MIRALAX / GLYCOLAX) 17 g packet Take 17 g by mouth daily as needed.  ? temazepam (RESTORIL)  30 MG capsule TAKE 1 CAPSULE BY MOUTH ONCE DAILY AT BEDTIME  ? tirzepatide (MOUNJARO) 12.5 MG/0.5ML Pen Inject 12.5 mg into the skin once a week.  ? tretinoin (RETIN-A) 0.05 % cream Apply to the face at bedtime  ? zolpidem (AMBIEN CR) 6.25 MG CR tablet Take 1 tablet (6.25 mg total) by mouth at bedtime as needed for sleep.  ? [DISCONTINUED] traZODone (DESYREL) 100 MG tablet Take 2 tablets (200 mg total) by mouth at bedtime.  ? ?No facility-administered encounter medications on file as of 04/05/2022.  ? ? ?Allergies as of 04/05/2022 - Review Complete 04/05/2022  ?Allergen Reaction Noted  ? Dilaudid [hydromorphone hcl] Other (See Comments) 09/23/2011  ? ? ?Past Medical History:  ?Diagnosis Date  ? Adjustment reaction with anxiety and depression 06/12/2014  ? Anemia   ? h/o low ferritin  ? Anxiety   ? Asthma   ? environmental triggers  ? Chicken pox as a child  ? Cough 08/22/2015  ? Depression   ? Depression with anxiety 06/12/2014  ? Diverticulosis   ? Ehrlichiosis 0737  ? Gallstones   ? GERD (gastroesophageal reflux disease)   ? Hair loss 03/28/2017  ? Hiatal hernia 2007  ? History of viral illness   ? Hyperlipidemia, mixed 04/11/2015  ? Insomnia 08/21/2017  ? Lactose intolerance 06/12/2014  ? Mumps as a child  ? Overweight 08/19/2014  ? Rectocele 06/12/2014  ?  RLS (restless legs syndrome)   ? 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 Right arm removed 2011  ? Sessile colonic polyp   ? SVT (supraventricular tachycardia) (HCC)   ? a. ablation in 05/2006  ? Tachycardia 06/12/2014  ? Thyroid disease   ? Vitamin D deficiency 08/22/2015  ? ? ?Past Surgical History:  ?Procedure Laterality Date  ? ABDOMINAL HYSTERECTOMY  11-2008  ? total with cystocele, rectocele repair  ? APPENDECTOMY    ? CHOLECYSTECTOMY    ? COLONOSCOPY  2006  ? diverticulosis, hemorrhoids ([facility])  ? ESOPHAGOGASTRODUODENOSCOPY  2006  ? small hiatus hernia ([facility])  ? ESOPHAGOGASTRODUODENOSCOPY N/A 02/21/2018  ? Procedure: ESOPHAGOGASTRODUODENOSCOPY (EGD);   Surgeon: Malissa Hippo, MD;  [facility];  Service: Endoscopy;  Laterality: N/A;  pt knows to arrive at 2:45  ? INCONTINENCE SURGERY    ? SKIN SURGERY    ? right arm scc  ? svt ablation    ? TONSILLECTOMY  1972  ? 2ith adenoid  ? WISDOM TOOTH EXTRACTION  66 yrs old  ? ? ?Family History  ?Problem Relation Age of Onset  ? Hypertension Mother   ? Hypertension Father   ? Hypertension Sister   ? Diabetes Sister   ?     type 2  ? Fibromyalgia Sister   ? Proteinuria Sister   ? GI Bleed Maternal Grandmother   ? Atrial fibrillation Maternal Grandmother   ? Heart disease Maternal Grandfather   ? Diabetes Paternal Grandmother   ? Stroke Paternal Grandfather   ? Colon cancer Neg Hx   ? Esophageal cancer Neg Hx   ? Rectal cancer Neg Hx   ? Stomach cancer Neg Hx   ? ? ?Social History  ? ?Socioeconomic History  ? Marital status: Divorced  ?  Spouse name: Not on file  ? Number of children: 2  ? Years of education: Not on file  ? Highest education level: Not on file  ?Occupational History  ? Occupation: Charity fundraiser  ?  Employer: [employer]  ?Tobacco Use  ? Smoking status: Never  ? Smokeless tobacco: Never  ?Vaping Use  ? Vaping Use: Never used  ?Substance and Sexual Activity  ? Alcohol use: No  ?  Alcohol/week: 0.0 standard drinks  ? Drug use: No  ? Sexual activity: Not Currently  ?  Partners: Male  ?  Comment: lives by self with 4 dogs works at [employer], avoids dairy  ?Other Topics Concern  ? Not on file  ?Social History Narrative  ? Not on file  ? ?Social Determinants of Health  ? ?Financial Resource Strain: Not on file  ?Food Insecurity: Not on file  ?Transportation Needs: Not on file  ?Physical Activity: Not on file  ?Stress: Not on file  ?Social Connections: Not on file  ?Intimate Partner Violence: Not on file  ? ? ?Review of Systems  ?Constitutional:  Positive for fatigue.  ?Psychiatric/Behavioral:  Positive for sleep disturbance.   ? ?Vitals:  ? 04/05/22 1225  ?BP: 104/60  ?Pulse: 85  ?Temp: 97.8 ?F (36.6 ?C)  ?SpO2:  97%  ? ? ? ?Physical Exam ?Constitutional:   ?   Appearance: Normal appearance.  ?HENT:  ?   Mouth/Throat:  ?   Comments: Macroglossia, Mallampati 2 ?Eyes:  ?   Pupils: Pupils are equal, round, and reactive to light.  ?Cardiovascular:  ?   Rate and Rhythm: Normal rate and regular rhythm.  ?   Heart sounds: No murmur heard. ?  No friction rub.  ?Pulmonary:  ?   Effort: No respiratory distress.  ?   Breath sounds: No stridor. No wheezing or rhonchi.  ?Musculoskeletal:  ?   Cervical back: No rigidity or tenderness.  ?Neurological:  ?   Mental Status: She is alert.  ?Psychiatric:     ?   Mood and Affect: Mood normal.  ? ? ?  12/14/2021  ?  3:00 PM  ?Results of the Epworth flowsheet  ?Sitting and reading 3  ?Watching TV 3  ?Sitting, inactive in a public place (e.g. a theatre or a meeting) 0  ?As a passenger in a car for an hour without a break 1  ?Lying down to rest in the afternoon when circumstances permit 3  ?Sitting and talking to someone 0  ?Sitting quietly after a lunch without alcohol 0  ?In a car, while stopped for a few minutes in traffic 0  ?Total score 10  ? ? ? ?Data Reviewed: ?Her sleep study from 02/05/2015 did reveal mild obstructive sleep apnea with an AHI of 7.5 ? ?Echo 06/07/7339 shows diastolic dysfunction ? ?Recent sleep study from 02/02/2022 reviewed personally showing negative study for significant obstructive sleep apnea, no significant oxygen desaturations ? ?Assessment:  ?Excessive daytime sleepiness ? ?Nonrestorative sleep with multiple awakenings ? ?History of snoring, dry mouth ? ?Negative study for significant sleep disordered breathing on recent sleep study ? ?Patient has bruxism and is being considered for a dental guard, an oral device may be an option of treatment as well ? ?Plan/Recommendations: ? ?Trial with Ambien to help with duration and quality of sleep ? ?We will start at 6.25 mg and increase dose as tolerated ? ?Tentative follow-up in about 3 months ? ?Encouraged to call if any  significant concerns ? ? ?Sherrilyn Rist MD ?[facility] ?04/06/2022, 9:19 AM ? ?CC: [name], MD ? ? ?

## 2022-04-12 ENCOUNTER — Encounter: Payer: Self-pay | Admitting: Gastroenterology

## 2022-04-12 ENCOUNTER — Ambulatory Visit: Payer: 59 | Admitting: Pulmonary Disease

## 2022-04-12 ENCOUNTER — Ambulatory Visit: Payer: 59 | Admitting: Gastroenterology

## 2022-04-12 VITALS — BP 102/58 | HR 103 | Ht 64.0 in | Wt 130.4 lb

## 2022-04-12 DIAGNOSIS — K219 Gastro-esophageal reflux disease without esophagitis: Secondary | ICD-10-CM | POA: Diagnosis not present

## 2022-04-12 DIAGNOSIS — K5904 Chronic idiopathic constipation: Secondary | ICD-10-CM | POA: Diagnosis not present

## 2022-04-12 DIAGNOSIS — Z08 Encounter for follow-up examination after completed treatment for malignant neoplasm: Secondary | ICD-10-CM | POA: Diagnosis not present

## 2022-04-12 DIAGNOSIS — L814 Other melanin hyperpigmentation: Secondary | ICD-10-CM | POA: Diagnosis not present

## 2022-04-12 DIAGNOSIS — D1801 Hemangioma of skin and subcutaneous tissue: Secondary | ICD-10-CM | POA: Diagnosis not present

## 2022-04-12 DIAGNOSIS — Z85828 Personal history of other malignant neoplasm of skin: Secondary | ICD-10-CM | POA: Diagnosis not present

## 2022-04-12 DIAGNOSIS — L821 Other seborrheic keratosis: Secondary | ICD-10-CM | POA: Diagnosis not present

## 2022-04-12 NOTE — Progress Notes (Signed)
? ? ?  Assessment   ?  ?Constipation, CIC. ?Gastroparesis and GERD.  ?Personal history of adenomatous colon polyps. ? ? ?Recommendations  ?  ?Increase Linzess to 145 mcg qd.  Contact us if constipation is not well controlled on this dosage and we will adjust further prior to follow-up office appointment in 6 weeks. ?Continue antireflux measures, gastroparesis diet and Nexium 40 mg po qd prn ?Colonoscopy in March 2027.  ? ? ?HPI  ?  ?This is a 66 year old female returning for follow-up of constipation, gastroparesis and GERD.  Her reflux symptoms and gastroparesis are under good control with frequent small meals and antireflux measures.  Her constipation was not helped with Linzess 72 mcg although she states she did miss several days of taking it. ? ? ?Labs / Imaging  ?  ? ?  Latest Ref Rng & Units 02/24/2022  ? 12:30 PM 11/29/2021  ?  9:10 AM 08/17/2021  ?  8:42 AM  ?Hepatic Function  ?Total Protein 6.1 - 8.1 g/dL 7.0   7.3   7.7    ?Albumin 3.5 - 5.2 g/dL  4.3   4.5    ?AST 10 - 35 U/L $Remo'16   17   20    'xRiOn$ ?ALT 6 - 29 U/L $Remo'16   16   22    'BnEgi$ ?Alk Phosphatase 39 - 117 U/L  56   77    ?Total Bilirubin 0.2 - 1.2 mg/dL 0.5   0.6   0.7    ? ? ? ?  Latest Ref Rng & Units 02/24/2022  ? 12:30 PM 11/29/2021  ?  9:10 AM 08/17/2021  ?  8:42 AM  ?CBC  ?WBC 3.8 - 10.8 Thousand/uL 6.8   6.6   6.4    ?Hemoglobin 11.7 - 15.5 g/dL 13.0   13.7   13.9    ?Hematocrit 35.0 - 45.0 % 39.0   40.2   41.9    ?Platelets 140 - 400 Thousand/uL 327   327.0   291    ? ? ? ?No results found. ? ? ?Current Medications, Allergies, Past Medical History, Past Surgical History, Family History and Social History were reviewed in [employer] record. ? ? ?Physical Exam: ?General: Well developed, well nourished, no acute distress ?Head: Normocephalic and atraumatic ?Eyes: Sclerae anicteric, EOMI ?Ears: Normal auditory acuity ?Mouth: Not examined, mask on during Covid-19 pandemic ?Lungs: Clear throughout to auscultation ?Heart: Regular rate and  rhythm; no murmurs, rubs or bruits ?Abdomen: Soft, non tender and non distended. No masses, hepatosplenomegaly or hernias noted. Normal Bowel sounds ?Rectal: Not done ?Musculoskeletal: Symmetrical with no gross deformities  ?Pulses:  Normal pulses noted ?Extremities: No clubbing, cyanosis, edema or deformities noted ?Neurological: Alert oriented x 4, grossly nonfocal ?Psychological:  Alert and cooperative. Normal mood and affect ? ? ?Pricilla Riffle. Fuller Plan, MD 04/12/2022, 1:13 PM  ?

## 2022-04-12 NOTE — Patient Instructions (Addendum)
We have sent the following medications to your pharmacy for you to pick up at your convenience: Linzess 145 mcg daily.  ? ?Contact us in 10-14 days if you do not see a good response to this Linzess dose.  ? ?The [facility] providers would like to encourage you to use [facility] to communicate with providers for non-urgent requests or questions.  Due to long hold times on the telephone, sending your provider a message by [facility] may be a faster and more efficient way to get a response.  Please allow 48 business hours for a response.  Please remember that this is for non-urgent requests.  ? ?Thank you for choosing me and [facility]. ? ?Malcolm T. Dagoberto Ligas., MD., [facility] Regal ? ? ?

## 2022-04-14 ENCOUNTER — Other Ambulatory Visit (HOSPITAL_COMMUNITY): Payer: Self-pay

## 2022-04-17 ENCOUNTER — Other Ambulatory Visit: Payer: Self-pay | Admitting: Gastroenterology

## 2022-04-17 ENCOUNTER — Other Ambulatory Visit (HOSPITAL_COMMUNITY): Payer: Self-pay

## 2022-04-17 ENCOUNTER — Encounter: Payer: Self-pay | Admitting: Gastroenterology

## 2022-04-17 MED ORDER — LINACLOTIDE 145 MCG PO CAPS
145.0000 ug | ORAL_CAPSULE | Freq: Every day | ORAL | 11 refills | Status: DC
  Filled 2022-04-17: qty 30, 30d supply, fill #0

## 2022-04-18 ENCOUNTER — Ambulatory Visit: Payer: 59 | Admitting: Gastroenterology

## 2022-04-18 ENCOUNTER — Other Ambulatory Visit (HOSPITAL_COMMUNITY): Payer: Self-pay

## 2022-04-20 ENCOUNTER — Other Ambulatory Visit (HOSPITAL_COMMUNITY): Payer: Self-pay

## 2022-05-02 ENCOUNTER — Other Ambulatory Visit: Payer: Self-pay | Admitting: Pulmonary Disease

## 2022-05-02 ENCOUNTER — Other Ambulatory Visit (HOSPITAL_COMMUNITY): Payer: Self-pay

## 2022-05-02 ENCOUNTER — Encounter: Payer: Self-pay | Admitting: Pulmonary Disease

## 2022-05-02 MED ORDER — ESZOPICLONE 2 MG PO TABS
2.0000 mg | ORAL_TABLET | Freq: Every evening | ORAL | 1 refills | Status: DC | PRN
  Filled 2022-05-02: qty 30, 30d supply, fill #0

## 2022-05-02 NOTE — Telephone Encounter (Signed)
Try Lunesta 2 mg in place of Ambien

## 2022-05-02 NOTE — Telephone Encounter (Signed)
Received the following message from patient:   "Hi ,  So I took the higher dose for 4 days. Doc is aware I can fall asleep, I don't stay asleep. He was hoping Ambien would work. He also mentioned perhaps Lunesta as an option.  Thanks, Cathey"  Dr. Ander Slade, can you please advise? Thanks!

## 2022-05-02 NOTE — Progress Notes (Signed)
Trial with Lunesta 2 mg in place of Ambien

## 2022-05-03 ENCOUNTER — Other Ambulatory Visit (HOSPITAL_COMMUNITY): Payer: Self-pay

## 2022-05-05 ENCOUNTER — Other Ambulatory Visit (HOSPITAL_COMMUNITY): Payer: Self-pay

## 2022-05-08 ENCOUNTER — Encounter: Payer: Self-pay | Admitting: Gastroenterology

## 2022-05-12 ENCOUNTER — Other Ambulatory Visit (HOSPITAL_COMMUNITY): Payer: Self-pay

## 2022-05-19 ENCOUNTER — Other Ambulatory Visit: Payer: Self-pay | Admitting: Family Medicine

## 2022-05-19 DIAGNOSIS — E782 Mixed hyperlipidemia: Secondary | ICD-10-CM

## 2022-05-20 ENCOUNTER — Other Ambulatory Visit (HOSPITAL_COMMUNITY): Payer: Self-pay

## 2022-05-22 ENCOUNTER — Other Ambulatory Visit (HOSPITAL_COMMUNITY): Payer: Self-pay

## 2022-05-22 MED ORDER — ATORVASTATIN CALCIUM 10 MG PO TABS
ORAL_TABLET | Freq: Every day | ORAL | 1 refills | Status: DC
  Filled 2022-05-22: qty 90, 90d supply, fill #0
  Filled 2022-08-15: qty 90, 90d supply, fill #1

## 2022-05-23 ENCOUNTER — Other Ambulatory Visit (HOSPITAL_COMMUNITY): Payer: Self-pay

## 2022-05-29 ENCOUNTER — Other Ambulatory Visit (HOSPITAL_COMMUNITY): Payer: Self-pay

## 2022-05-30 ENCOUNTER — Encounter: Payer: Self-pay | Admitting: Gastroenterology

## 2022-05-30 ENCOUNTER — Other Ambulatory Visit (HOSPITAL_COMMUNITY): Payer: Self-pay

## 2022-05-30 ENCOUNTER — Ambulatory Visit: Payer: 59 | Admitting: Gastroenterology

## 2022-05-30 VITALS — BP 100/70 | HR 88 | Ht 64.0 in | Wt 130.0 lb

## 2022-05-30 DIAGNOSIS — K5792 Diverticulitis of intestine, part unspecified, without perforation or abscess without bleeding: Secondary | ICD-10-CM | POA: Diagnosis not present

## 2022-05-30 DIAGNOSIS — K2100 Gastro-esophageal reflux disease with esophagitis, without bleeding: Secondary | ICD-10-CM

## 2022-05-30 DIAGNOSIS — K5904 Chronic idiopathic constipation: Secondary | ICD-10-CM

## 2022-05-30 MED ORDER — CIPROFLOXACIN HCL 500 MG PO TABS
500.0000 mg | ORAL_TABLET | Freq: Two times a day (BID) | ORAL | 0 refills | Status: DC
  Filled 2022-05-30: qty 14, 7d supply, fill #0

## 2022-05-30 MED ORDER — LINACLOTIDE 72 MCG PO CAPS
72.0000 ug | ORAL_CAPSULE | Freq: Every day | ORAL | 5 refills | Status: DC
  Filled 2022-05-30: qty 30, 30d supply, fill #0
  Filled 2022-12-14: qty 30, 30d supply, fill #1

## 2022-05-30 MED ORDER — METRONIDAZOLE 500 MG PO TABS
500.0000 mg | ORAL_TABLET | Freq: Two times a day (BID) | ORAL | 0 refills | Status: DC
  Filled 2022-05-30: qty 14, 7d supply, fill #0

## 2022-05-31 ENCOUNTER — Other Ambulatory Visit (HOSPITAL_COMMUNITY): Payer: Self-pay

## 2022-06-01 ENCOUNTER — Other Ambulatory Visit (HOSPITAL_COMMUNITY): Payer: Self-pay

## 2022-06-02 ENCOUNTER — Other Ambulatory Visit (HOSPITAL_COMMUNITY): Payer: Self-pay

## 2022-06-19 NOTE — Progress Notes (Signed)
Subjective:    Patient ID: [name] Jensen, female    DOB: [birth year], 66 y.o.   MRN: [id number]  No chief complaint on file.   HPI Patient is in today for a follow up on chronic medical concerns. No recent febrile illness or acute hospitalizations. She has had good weight loss with Semaglutide but is now feeling weak and even light headed at times. Denies CP/palp/SOB/HA/congestion/fevers/GI or GU c/o. Taking meds as prescribed   Past Medical History:  Diagnosis Date   Adjustment reaction with anxiety and depression 06/12/2014   Anemia    h/o low ferritin   Anxiety    Asthma    environmental triggers   Chicken pox as a child   Cough 08/22/2015   Depression    Depression with anxiety 06/12/2014   Diverticulosis    Ehrlichiosis 2014   Gallstones    GERD (gastroesophageal reflux disease)    Hair loss 03/28/2017   Hiatal hernia 2007   History of viral illness    Hyperlipidemia, mixed 04/11/2015   Insomnia 08/21/2017   Lactose intolerance 06/12/2014   Mumps as a child   Overweight 08/19/2014   Rectocele 06/12/2014   RLS (restless legs syndrome)    SCC (squamous cell carcinoma) 06/12/2014   Right arm removed 2011   Sessile colonic polyp    SVT (supraventricular tachycardia) (HCC)    a. ablation in 05/2006   Tachycardia 06/12/2014   Thyroid disease    Vitamin D deficiency 08/22/2015    Past Surgical History:  Procedure Laterality Date   ABDOMINAL HYSTERECTOMY  11-2008   total with cystocele, rectocele repair   APPENDECTOMY     CHOLECYSTECTOMY     COLONOSCOPY  2006   diverticulosis, hemorrhoids ([facility])   ESOPHAGOGASTRODUODENOSCOPY  2006   small hiatus hernia ([facility])   ESOPHAGOGASTRODUODENOSCOPY N/A 02/21/2018   Procedure: ESOPHAGOGASTRODUODENOSCOPY (EGD);  Surgeon: Malissa Hippo, MD;  [facility];  Service: Endoscopy;  Laterality: N/A;  pt knows to arrive at 2:45   INCONTINENCE SURGERY     SKIN SURGERY     right arm scc   svt ablation      TONSILLECTOMY  1972   2ith adenoid   WISDOM TOOTH EXTRACTION  66 yrs old    Family History  Problem Relation Age of Onset   Hypertension Mother    Hypertension Father    Hypertension Sister    Diabetes Sister        type 2   Fibromyalgia Sister    Multiple sclerosis Sister    Proteinuria Sister    GI Bleed Maternal Grandmother    Atrial fibrillation Maternal Grandmother    Heart disease Maternal Grandfather    Diabetes Paternal Grandmother    Stroke Paternal Grandfather    Colon cancer Neg Hx    Esophageal cancer Neg Hx    Rectal cancer Neg Hx    Stomach cancer Neg Hx     Social History   Socioeconomic History   Marital status: Divorced    Spouse name: Not on file   Number of children: 2   Years of education: Not on file   Highest education level: Not on file  Occupational History   Occupation: Teacher, adult education: [employer]  Tobacco Use   Smoking status: Never   Smokeless tobacco: Never  Vaping Use   Vaping Use: Never used  Substance and Sexual Activity   Alcohol use: No    Alcohol/week: 0.0 standard drinks of alcohol  Drug use: No   Sexual activity: Not Currently    Partners: Male    Comment: lives by self with 4 dogs works at [employer], avoids dairy  Other Topics Concern   Not on file  Social History Narrative   Not on file   Social Determinants of Health   Financial Resource Strain: Not on file  Food Insecurity: Not on file  Transportation Needs: Not on file  Physical Activity: Not on file  Stress: Not on file  Social Connections: Not on file  Intimate Partner Violence: Not on file    Outpatient Medications Prior to Visit  Medication Sig Dispense Refill   atorvastatin (LIPITOR) 10 MG tablet TAKE 1 TABLET BY MOUTH DAILY 90 tablet 1   bisoprolol (ZEBETA) 5 MG tablet Take 1/2 tablet (2.5 mg total) by mouth daily.  Replaces Metoprolol. 45 tablet 3   blood glucose meter kit and supplies KIT Dispense based on patient and insurance preference. Use up  to four times daily as directed. (FOR ICD-9 250.00, 250.01). 1 each 0   Cholecalciferol (VITAMIN D3) 5000 units CAPS Take 5,000 Units by mouth daily.      esomeprazole (NEXIUM) 40 MG capsule Take 1 capsule (40 mg total) by mouth daily. 60 capsule 11   Estradiol (IMVEXXY MAINTENANCE PACK) 10 MCG INST Place 1 capsule vaginally 2 (two) times a week. 8 each 11   estradiol (VIVELLE-DOT) 0.05 MG/24HR patch Place 1 patch (0.05 mg total) onto the skin 2 (two) times a week. 24 patch 4   linaclotide (LINZESS) 72 MCG capsule Take 1 capsule (72 mcg total) by mouth daily before breakfast. 30 capsule 5   polyethylene glycol (MIRALAX / GLYCOLAX) 17 g packet Take 17 g by mouth daily as needed.     temazepam (RESTORIL) 30 MG capsule TAKE 1 CAPSULE BY MOUTH ONCE DAILY AT BEDTIME 30 capsule 5   tirzepatide (MOUNJARO) 12.5 MG/0.5ML Pen Inject 12.5 mg into the skin once a week. 6 mL 1   traZODone (DESYREL) 100 MG tablet Take 2 tablets (200 mg total) by mouth at bedtime. 180 tablet 1   tretinoin (RETIN-A) 0.05 % cream Apply to the face at bedtime 45 g 3   No facility-administered medications prior to visit.    Allergies  Allergen Reactions   Dilaudid [Hydromorphone Hcl] Other (See Comments)    Respiratory , cardiac     Review of Systems  Constitutional:  Positive for malaise/fatigue. Negative for fever.  HENT:  Negative for congestion.   Eyes:  Negative for blurred vision.  Respiratory:  Negative for shortness of breath.   Cardiovascular:  Positive for palpitations. Negative for chest pain and leg swelling.  Gastrointestinal:  Negative for abdominal pain, blood in stool and nausea.  Genitourinary:  Negative for dysuria and frequency.  Musculoskeletal:  Negative for falls.  Skin:  Negative for rash.  Neurological:  Positive for weakness. Negative for dizziness, loss of consciousness and headaches.  Endo/Heme/Allergies:  Negative for environmental allergies.  Psychiatric/Behavioral:  Negative for  depression. The patient is not nervous/anxious.        Objective:    Physical Exam Constitutional:      General: She is not in acute distress.    Appearance: She is well-developed.  HENT:     Head: Normocephalic and atraumatic.  Eyes:     Conjunctiva/sclera: Conjunctivae normal.  Neck:     Thyroid: No thyromegaly.  Cardiovascular:     Rate and Rhythm: Normal rate and regular rhythm.  Heart sounds: Normal heart sounds. No murmur heard. Pulmonary:     Effort: Pulmonary effort is normal. No respiratory distress.     Breath sounds: Normal breath sounds.  Abdominal:     General: Bowel sounds are normal. There is no distension.     Palpations: Abdomen is soft. There is no mass.     Tenderness: There is no abdominal tenderness.  Musculoskeletal:     Cervical back: Neck supple.  Lymphadenopathy:     Cervical: No cervical adenopathy.  Skin:    General: Skin is warm and dry.  Neurological:     Mental Status: She is alert and oriented to person, place, and time.  Psychiatric:        Behavior: Behavior normal.     LMP  (LMP Unknown)  Wt Readings from Last 3 Encounters:  05/30/22 130 lb (59 kg)  04/12/22 130 lb 6.4 oz (59.1 kg)  04/05/22 130 lb (59 kg)    Diabetic Foot Exam - Simple   No data filed    Lab Results  Component Value Date   WBC 6.8 02/24/2022   HGB 13.0 02/24/2022   HCT 39.0 02/24/2022   PLT 327 02/24/2022   GLUCOSE 99 02/24/2022   CHOL 127 02/24/2022   TRIG 84 02/24/2022   HDL 53 02/24/2022   LDLDIRECT 72.0 04/26/2020   LDLCALC 58 02/24/2022   ALT 16 02/24/2022   AST 16 02/24/2022   NA 144 02/24/2022   K 4.8 02/24/2022   CL 108 02/24/2022   CREATININE 0.82 02/24/2022   BUN 13 02/24/2022   CO2 17 (L) 02/24/2022   TSH 2.03 02/24/2022   INR 1.0 11/09/2008   HGBA1C 5.3 02/24/2022   MICROALBUR 3.8 02/24/2022    Lab Results  Component Value Date   TSH 2.03 02/24/2022   Lab Results  Component Value Date   WBC 6.8 02/24/2022   HGB 13.0  02/24/2022   HCT 39.0 02/24/2022   MCV 85.2 02/24/2022   PLT 327 02/24/2022   Lab Results  Component Value Date   NA 144 02/24/2022   K 4.8 02/24/2022   CO2 17 (L) 02/24/2022   GLUCOSE 99 02/24/2022   BUN 13 02/24/2022   CREATININE 0.82 02/24/2022   BILITOT 0.5 02/24/2022   ALKPHOS 56 11/29/2021   AST 16 02/24/2022   ALT 16 02/24/2022   PROT 7.0 02/24/2022   ALBUMIN 4.3 11/29/2021   CALCIUM 9.7 02/24/2022   ANIONGAP 7 08/17/2021   GFR 56.61 (L) 11/29/2021   Lab Results  Component Value Date   CHOL 127 02/24/2022   Lab Results  Component Value Date   HDL 53 02/24/2022   Lab Results  Component Value Date   LDLCALC 58 02/24/2022   Lab Results  Component Value Date   TRIG 84 02/24/2022   Lab Results  Component Value Date   CHOLHDL 2.4 02/24/2022   Lab Results  Component Value Date   HGBA1C 5.3 02/24/2022       Assessment & Plan:      Problem List Items Addressed This Visit   None   I am having Santina Evans A. Zollinger "Cathey" maintain her Vitamin D3, blood glucose meter kit and supplies, tretinoin, esomeprazole, bisoprolol, polyethylene glycol, tirzepatide, temazepam, estradiol, Imvexxy Maintenance Pack, traZODone, atorvastatin, and linaclotide.  No orders of the defined types were placed in this encounter.

## 2022-06-20 ENCOUNTER — Ambulatory Visit: Payer: 59 | Admitting: Family Medicine

## 2022-06-20 ENCOUNTER — Other Ambulatory Visit (HOSPITAL_COMMUNITY): Payer: Self-pay

## 2022-06-20 ENCOUNTER — Encounter: Payer: Self-pay | Admitting: Family Medicine

## 2022-06-20 VITALS — BP 118/70 | HR 78 | Resp 20 | Ht 64.0 in | Wt 130.4 lb

## 2022-06-20 DIAGNOSIS — M542 Cervicalgia: Secondary | ICD-10-CM

## 2022-06-20 DIAGNOSIS — R739 Hyperglycemia, unspecified: Secondary | ICD-10-CM

## 2022-06-20 DIAGNOSIS — R000 Tachycardia, unspecified: Secondary | ICD-10-CM | POA: Diagnosis not present

## 2022-06-20 DIAGNOSIS — L659 Nonscarring hair loss, unspecified: Secondary | ICD-10-CM | POA: Diagnosis not present

## 2022-06-20 DIAGNOSIS — E782 Mixed hyperlipidemia: Secondary | ICD-10-CM

## 2022-06-20 DIAGNOSIS — G2581 Restless legs syndrome: Secondary | ICD-10-CM

## 2022-06-20 DIAGNOSIS — E063 Autoimmune thyroiditis: Secondary | ICD-10-CM

## 2022-06-20 DIAGNOSIS — E8881 Metabolic syndrome: Secondary | ICD-10-CM | POA: Diagnosis not present

## 2022-06-20 DIAGNOSIS — E559 Vitamin D deficiency, unspecified: Secondary | ICD-10-CM | POA: Insufficient documentation

## 2022-06-20 DIAGNOSIS — E6609 Other obesity due to excess calories: Secondary | ICD-10-CM

## 2022-06-20 DIAGNOSIS — E1165 Type 2 diabetes mellitus with hyperglycemia: Secondary | ICD-10-CM

## 2022-06-20 MED ORDER — PREMARIN 0.625 MG/GM VA CREA
TOPICAL_CREAM | VAGINAL | 5 refills | Status: DC
  Filled 2022-06-20: qty 30, 30d supply, fill #0

## 2022-06-20 MED ORDER — TIRZEPATIDE 10 MG/0.5ML ~~LOC~~ SOAJ
10.0000 mg | SUBCUTANEOUS | 5 refills | Status: DC
  Filled 2022-06-20: qty 6, 84d supply, fill #0
  Filled 2022-08-25: qty 6, 84d supply, fill #1
  Filled 2022-09-06: qty 2, 28d supply, fill #1

## 2022-06-20 NOTE — Patient Instructions (Addendum)
Resveratrol for memory  [employer] tea do not drink  Similisan eye drops

## 2022-06-20 NOTE — Assessment & Plan Note (Signed)
hgba1c acceptable, minimize simple carbs. Increase exercise as tolerated.  

## 2022-06-20 NOTE — Assessment & Plan Note (Signed)
Check random insulin

## 2022-06-20 NOTE — Assessment & Plan Note (Signed)
Supplement and monitor 

## 2022-06-21 ENCOUNTER — Other Ambulatory Visit (HOSPITAL_COMMUNITY): Payer: Self-pay

## 2022-06-21 LAB — TSH: TSH: 2.16 u[IU]/mL (ref 0.35–5.50)

## 2022-06-21 LAB — T4, FREE: Free T4: 1.07 ng/dL (ref 0.60–1.60)

## 2022-06-21 LAB — LIPID PANEL
Cholesterol: 163 mg/dL (ref 0–200)
HDL: 58.8 mg/dL (ref >39.00)
LDL Cholesterol: 85 mg/dL (ref 0–99)
NonHDL: 104.05
Total CHOL/HDL Ratio: 3
Triglycerides: 95 mg/dL (ref 0.0–149.0)
VLDL: 19 mg/dL (ref 0.0–40.0)

## 2022-06-21 LAB — COMPREHENSIVE METABOLIC PANEL
ALT: 21 U/L (ref 0–35)
AST: 18 U/L (ref 0–37)
Albumin: 4.5 g/dL (ref 3.5–5.2)
Alkaline Phosphatase: 64 U/L (ref 39–117)
BUN: 14 mg/dL (ref 6–23)
CO2: 27 mEq/L (ref 19–32)
Calcium: 9.4 mg/dL (ref 8.4–10.5)
Chloride: 103 mEq/L (ref 96–112)
Creatinine, Ser: 0.69 mg/dL (ref 0.40–1.20)
GFR: 92.05 mL/min (ref >60.00)
Glucose, Bld: 91 mg/dL (ref 70–99)
Potassium: 4.1 mEq/L (ref 3.5–5.1)
Sodium: 138 mEq/L (ref 135–145)
Total Bilirubin: 0.5 mg/dL (ref 0.2–1.2)
Total Protein: 7.1 g/dL (ref 6.0–8.3)

## 2022-06-21 LAB — CBC
HCT: 38.5 % (ref 36.0–46.0)
Hemoglobin: 13 g/dL (ref 12.0–15.0)
MCHC: 33.7 g/dL (ref 30.0–36.0)
MCV: 85.4 fl (ref 78.0–100.0)
Platelets: 322 10*3/uL (ref 150.0–400.0)
RBC: 4.51 Mil/uL (ref 3.87–5.11)
RDW: 13.8 % (ref 11.5–15.5)
WBC: 6.8 10*3/uL (ref 4.0–10.5)

## 2022-06-21 LAB — HEMOGLOBIN A1C: Hgb A1c MFr Bld: 5.4 % (ref 4.6–6.5)

## 2022-06-21 LAB — THYROID PEROXIDASE ANTIBODY: Thyroperoxidase Ab SerPl-aCnc: 1 IU/mL (ref <9)

## 2022-06-21 LAB — VITAMIN D 25 HYDROXY (VIT D DEFICIENCY, FRACTURES): VITD: 67.3 ng/mL (ref 30.00–100.00)

## 2022-06-21 LAB — T3, FREE: T3, Free: 2.9 pg/mL (ref 2.3–4.2)

## 2022-06-21 LAB — INSULIN, RANDOM: Insulin: 10.5 u[IU]/mL

## 2022-06-21 NOTE — Assessment & Plan Note (Signed)
Improved on recheck.  

## 2022-06-21 NOTE — Assessment & Plan Note (Signed)
Great response to Semaglutide will decrease strength to 12.5

## 2022-06-26 ENCOUNTER — Ambulatory Visit (HOSPITAL_BASED_OUTPATIENT_CLINIC_OR_DEPARTMENT_OTHER)
Admission: RE | Admit: 2022-06-26 | Discharge: 2022-06-26 | Disposition: A | Discharge status: Home / Self Care | Payer: 59 | Source: Ambulatory Visit | Attending: Family Medicine | Admitting: Family Medicine

## 2022-06-26 DIAGNOSIS — R000 Tachycardia, unspecified: Secondary | ICD-10-CM | POA: Diagnosis not present

## 2022-06-26 DIAGNOSIS — L659 Nonscarring hair loss, unspecified: Secondary | ICD-10-CM | POA: Diagnosis not present

## 2022-06-26 DIAGNOSIS — E063 Autoimmune thyroiditis: Secondary | ICD-10-CM | POA: Diagnosis not present

## 2022-06-26 DIAGNOSIS — E042 Nontoxic multinodular goiter: Secondary | ICD-10-CM | POA: Diagnosis not present

## 2022-06-26 DIAGNOSIS — M542 Cervicalgia: Secondary | ICD-10-CM | POA: Insufficient documentation

## 2022-07-12 ENCOUNTER — Encounter (INDEPENDENT_AMBULATORY_CARE_PROVIDER_SITE_OTHER): Payer: Self-pay

## 2022-07-13 ENCOUNTER — Other Ambulatory Visit (HOSPITAL_COMMUNITY): Payer: Self-pay

## 2022-07-13 ENCOUNTER — Encounter: Payer: Self-pay | Admitting: Pulmonary Disease

## 2022-07-13 ENCOUNTER — Other Ambulatory Visit: Payer: Self-pay | Admitting: Pulmonary Disease

## 2022-07-14 ENCOUNTER — Other Ambulatory Visit (HOSPITAL_COMMUNITY): Payer: Self-pay

## 2022-07-14 MED ORDER — ESZOPICLONE 2 MG PO TABS: 2.0000 mg | ORAL_TABLET | Freq: Every evening | ORAL | 1 refills | Status: DC | PRN

## 2022-07-14 MED ORDER — ESZOPICLONE 2 MG PO TABS
ORAL_TABLET | ORAL | 1 refills | Status: DC
  Filled 2022-07-14: qty 30, 30d supply, fill #0
  Filled 2022-08-21 – 2022-08-23 (×2): qty 30, 30d supply, fill #1

## 2022-07-17 ENCOUNTER — Other Ambulatory Visit (HOSPITAL_COMMUNITY): Payer: Self-pay

## 2022-07-24 ENCOUNTER — Other Ambulatory Visit (HOSPITAL_COMMUNITY): Payer: Self-pay

## 2022-07-26 ENCOUNTER — Ambulatory Visit: Payer: 59 | Admitting: Pulmonary Disease

## 2022-07-26 ENCOUNTER — Encounter: Payer: Self-pay | Admitting: Pulmonary Disease

## 2022-07-26 ENCOUNTER — Other Ambulatory Visit (HOSPITAL_COMMUNITY): Payer: Self-pay

## 2022-07-26 VITALS — BP 104/60 | HR 104 | Ht 64.0 in | Wt 130.0 lb

## 2022-07-26 DIAGNOSIS — G478 Other sleep disorders: Secondary | ICD-10-CM

## 2022-07-26 MED ORDER — ESZOPICLONE 3 MG PO TABS
3.0000 mg | ORAL_TABLET | Freq: Every day | ORAL | 2 refills | Status: DC
  Filled 2022-07-26: qty 30, 30d supply, fill #0
  Filled 2022-11-30: qty 30, 30d supply, fill #1
  Filled ????-??-??: fill #1

## 2022-07-26 NOTE — Progress Notes (Signed)
[name] Jensen    [id number]    Jul 21, 1956  Primary Care Physician:Blyth, Bonnita Levan, MD  Referring Physician: [name], MD [address] [place],  [place] [address]  Chief complaint:   Concern for obstructive sleep apnea after she followed up with her dentist  HPI:  She had a recent sleep study that was negative for significant sleep apnea Still has significant issues with her sleep at night  Previous sleep study from 2016 did reveal mild obstructive sleep apnea and had a study that was done about 10 years ago was inconclusive  Wakes up in the morning with a dry mouth, parched lips, sometimes awakens from sleep with tachycardia  She is trying to change sleep position to see if this may help the mouth dryness and mouth hanging open at night -Does have a history of tachycardia  Still wakes up sometimes feeling nonrestorative  Lunesta 2 mg helps about half the time  Dad snored  Memory is fine  Active at work, planning on retiring a couple years  Usually goes to bed about 10 PM-11 PM, takes about 30 minutes to fall asleep, about 2 awakenings Final wake up time about 5:30 AM  She is down about 45 pounds recently   Outpatient Encounter Medications as of 07/26/2022  Medication Sig   atorvastatin (LIPITOR) 10 MG tablet TAKE 1 TABLET BY MOUTH DAILY   bisoprolol (ZEBETA) 5 MG tablet Take 1/2 tablet (2.5 mg total) by mouth daily.  Replaces Metoprolol.   blood glucose meter kit and supplies KIT Dispense based on patient and insurance preference. Use up to four times daily as directed. (FOR ICD-9 250.00, 250.01).   Cholecalciferol (VITAMIN D3) 5000 units CAPS Take 5,000 Units by mouth daily.    conjugated estrogens (PREMARIN) vaginal cream Apply small amount to vaginal mucosa twice daily   esomeprazole (NEXIUM) 40 MG capsule Take 1 capsule (40 mg total) by mouth daily.   eszopiclone (LUNESTA) 2 MG TABS tablet Take 1 tablet by mouth at bedtime as  needed for sleep   linaclotide (LINZESS) 72 MCG capsule Take 1 capsule (72 mcg total) by mouth daily before breakfast.   polyethylene glycol (MIRALAX / GLYCOLAX) 17 g packet Take 17 g by mouth daily as needed.   temazepam (RESTORIL) 30 MG capsule TAKE 1 CAPSULE BY MOUTH ONCE DAILY AT BEDTIME   tirzepatide (MOUNJARO) 10 MG/0.5ML Pen Inject 10 mg into the skin once a week.   traZODone (DESYREL) 100 MG tablet Take 2 tablets (200 mg total) by mouth at bedtime.   tretinoin (RETIN-A) 0.05 % cream Apply to the face at bedtime   [DISCONTINUED] eszopiclone (LUNESTA) 2 MG TABS tablet Take 1 tablet (2 mg total) by mouth at bedtime as needed for sleep. Take immediately before bedtime (Patient not taking: Reported on 07/26/2022)   No facility-administered encounter medications on file as of 07/26/2022.    Allergies as of 64/23/2023 - Review Complete 64/23/2023  Allergen Reaction Noted   Dilaudid [hydromorphone hcl] Other (See Comments) 09/23/2011    Past Medical History:  Diagnosis Date   Adjustment reaction with anxiety and depression 06/12/2014   Anemia    h/o low ferritin   Anxiety    Asthma    environmental triggers   Chicken pox as a child   Cough 08/22/2015   Depression    Depression with anxiety 06/12/2014   Diverticulosis    Ehrlichiosis 4496   Gallstones  GERD (gastroesophageal reflux disease)    Hair loss 03/28/2017   Hiatal hernia 2007   History of viral illness    Hyperlipidemia, mixed 04/11/2015   Insomnia 08/21/2017   Lactose intolerance 06/12/2014   Mumps as a child   Overweight 08/19/2014   Rectocele 06/12/2014   RLS (restless legs syndrome)    SCC (squamous cell carcinoma) 06/12/2014   Right arm removed 2011   Sessile colonic polyp    SVT (supraventricular tachycardia) ([facility])    a. ablation in 05/2006   Tachycardia 06/12/2014   Thyroid disease    Vitamin D deficiency 08/22/2015    Past Surgical History:  Procedure Laterality Date   ABDOMINAL HYSTERECTOMY   11-2008   total with cystocele, rectocele repair   APPENDECTOMY     CHOLECYSTECTOMY     COLONOSCOPY  2006   diverticulosis, hemorrhoids ([facility])   ESOPHAGOGASTRODUODENOSCOPY  2006   small hiatus hernia ([facility])   ESOPHAGOGASTRODUODENOSCOPY N/A 02/21/2018   Procedure: ESOPHAGOGASTRODUODENOSCOPY (EGD);  Surgeon: Rogene Houston, MD;  [facility];  Service: Endoscopy;  Laterality: N/A;  pt knows to arrive at 2:45   [facility]     right arm scc   svt ablation     TONSILLECTOMY  1972   2ith adenoid   WISDOM TOOTH EXTRACTION  66 yrs old    Family History  Problem Relation Age of Onset   Hypertension Mother    Hypertension Father    Hypertension Sister    Diabetes Sister        type 2   Fibromyalgia Sister    Multiple sclerosis Sister    Proteinuria Sister    GI Bleed Maternal Grandmother    Atrial fibrillation Maternal Grandmother    Heart disease Maternal Grandfather    Diabetes Paternal Grandmother    Stroke Paternal Grandfather    Colon cancer Neg Hx    Esophageal cancer Neg Hx    Rectal cancer Neg Hx    Stomach cancer Neg Hx     Social History   Socioeconomic History   Marital status: Divorced    Spouse name: Not on file   Number of children: 2   Years of education: Not on file   Highest education level: Not on file  Occupational History   Occupation: Programmer, multimedia: [employer]  Tobacco Use   Smoking status: Never   Smokeless tobacco: Never  Vaping Use   Vaping Use: Never used  Substance and Sexual Activity   Alcohol use: No    Alcohol/week: 0.0 standard drinks of alcohol   Drug use: No   Sexual activity: Not Currently    Partners: Male    Comment: lives by self with 4 dogs works at [employer], avoids dairy  Other Topics Concern   Not on file  Social History Narrative   Not on file   Social Determinants of Health   Financial Resource Strain: Not on file  Food Insecurity: Not on file  Transportation Needs: Not  on file  Physical Activity: Not on file  Stress: Not on file  Social Connections: Not on file  Intimate Partner Violence: Not on file    Review of Systems  Constitutional:  Positive for fatigue.  Psychiatric/Behavioral:  Positive for sleep disturbance.     Vitals:   07/26/22 1457  BP: 104/60  Pulse: (!) 104  SpO2: 98%     Physical Exam Constitutional:      Appearance:  Normal appearance.  HENT:     Mouth/Throat:     Comments: Macroglossia, Mallampati 2 Eyes:     Pupils: Pupils are equal, round, and reactive to light.  Cardiovascular:     Rate and Rhythm: Normal rate and regular rhythm.     Heart sounds: No murmur heard.    No friction rub.  Pulmonary:     Effort: No respiratory distress.     Breath sounds: No stridor. No wheezing or rhonchi.  Musculoskeletal:     Cervical back: No rigidity or tenderness.  Neurological:     Mental Status: She is alert.  Psychiatric:        Mood and Affect: Mood normal.       12/14/2021    3:00 PM  Results of the Epworth flowsheet  Sitting and reading 3  Watching TV 3  Sitting, inactive in a public place (e.g. a theatre or a meeting) 0  As a passenger in a car for an hour without a break 1  Lying down to rest in the afternoon when circumstances permit 3  Sitting and talking to someone 0  Sitting quietly after a lunch without alcohol 0  In a car, while stopped for a few minutes in traffic 0  Total score 10     Data Reviewed: Her sleep study from 02/05/2015 did reveal mild obstructive sleep apnea with an AHI of 7.5  Echo 12/09/5798 shows diastolic dysfunction  Recent sleep study from 02/02/2022 reviewed personally showing negative study for significant obstructive sleep apnea, no significant oxygen desaturations  Assessment:  Excessive daytime sleepiness  Nonrestorative sleep with multiple awakenings  Snoring and dry mouth  Most recent sleep study was negative for significant sleep apnea  Lunesta 2 mg does help about  half the time, going to a higher dose at 3 mg was discussed and this may be alternated with the 2 mg  Patient has bruxism and is being considered for a dental guard, an oral device may be an option of treatment as well  Plan/Recommendations:  Lunesta 2 mg to alternate with 3 mg as tolerated  Tentative follow-up in about 4 to 5 months  She will continue to try and modify sleep position to see if this helps quality of sleep  Encouraged to call if any significant concerns   Sherrilyn Rist MD [facility] 07/26/2022, 3:05 PM  CC: [name], MD

## 2022-07-26 NOTE — Patient Instructions (Signed)
Prescription for Lunesta 3 mg sent to pharmacy for you  I will see you in about 4 to 5 months  Call with significant concerns  Alternate 2 mg with 3 mg of Lunesta as tolerated

## 2022-07-27 ENCOUNTER — Other Ambulatory Visit (HOSPITAL_COMMUNITY): Payer: Self-pay

## 2022-08-15 ENCOUNTER — Other Ambulatory Visit (HOSPITAL_COMMUNITY): Payer: Self-pay

## 2022-08-16 ENCOUNTER — Ambulatory Visit: Payer: 59 | Admitting: Medical

## 2022-08-21 ENCOUNTER — Other Ambulatory Visit (HOSPITAL_COMMUNITY): Payer: Self-pay

## 2022-08-23 ENCOUNTER — Other Ambulatory Visit (HOSPITAL_COMMUNITY): Payer: Self-pay

## 2022-08-23 ENCOUNTER — Ambulatory Visit: Payer: 59 | Admitting: Family

## 2022-08-23 ENCOUNTER — Other Ambulatory Visit (HOSPITAL_BASED_OUTPATIENT_CLINIC_OR_DEPARTMENT_OTHER): Payer: Self-pay

## 2022-08-23 VITALS — BP 109/64 | HR 92 | Temp 98.1°F | Resp 16 | Wt 132.0 lb

## 2022-08-23 DIAGNOSIS — H9202 Otalgia, left ear: Secondary | ICD-10-CM

## 2022-08-23 MED ORDER — AMOXICILLIN 500 MG PO CAPS
500.0000 mg | ORAL_CAPSULE | Freq: Three times a day (TID) | ORAL | 0 refills | Status: AC
  Filled 2022-08-23: qty 30, 10d supply, fill #0

## 2022-08-23 NOTE — Assessment & Plan Note (Addendum)
New.  Rather severe- really bothering her.  Exam is fairly unremarkable.  Recommended that she add flonase 2 sprays each nostril daily for next 1-2 weeks to help promote eustachian tube drainage and add empiric amoxicillin which will cover in case of strep throat/developing L OM. She is advised to call if symptoms worsen or if symptoms do not improve. Pt verbalizes understanding.

## 2022-08-23 NOTE — Progress Notes (Signed)
Subjective:     Patient ID: [name] Jensen, female    DOB: [birth year], 66 y.o.   MRN: [id number]  Chief Complaint  Patient presents with   Ear Pain    Complains of left ear and neck pain   Sore Throat    Complains of sore throat "on the left side on and off"    Sore Throat     Patient is in today for ear pain. Pain is in the left ear, radiates down the left neck and throat. Has done motrin/tylenol with minimal improvement.  Had a new crown left lower back tooth. Denies tooth pain. Covid test negative at the beginning of the illness.   Health Maintenance Due  Topic Date Due   Diabetic kidney evaluation - Urine ACR  Never done   COVID-19 Vaccine (6 - Moderna risk series) 10/14/2021   INFLUENZA VACCINE  07/04/2022    Past Medical History:  Diagnosis Date   Adjustment reaction with anxiety and depression 06/12/2014   Anemia    h/o low ferritin   Anxiety    Asthma    environmental triggers   Chicken pox as a child   Cough 08/22/2015   Depression    Depression with anxiety 06/12/2014   Diverticulosis    Ehrlichiosis 2014   Gallstones    GERD (gastroesophageal reflux disease)    Hair loss 03/28/2017   Hiatal hernia 2007   History of viral illness    Hyperlipidemia, mixed 04/11/2015   Insomnia 08/21/2017   Lactose intolerance 06/12/2014   Mumps as a child   Overweight 08/19/2014   Rectocele 06/12/2014   RLS (restless legs syndrome)    SCC (squamous cell carcinoma) 06/12/2014   Right arm removed 2011   Sessile colonic polyp    SVT (supraventricular tachycardia) ([facility])    a. ablation in 05/2006   Tachycardia 06/12/2014   Thyroid disease    Vitamin D deficiency 08/22/2015    Past Surgical History:  Procedure Laterality Date   ABDOMINAL HYSTERECTOMY  11-2008   total with cystocele, rectocele repair   APPENDECTOMY     CHOLECYSTECTOMY     COLONOSCOPY  2006   diverticulosis, hemorrhoids ([facility])   ESOPHAGOGASTRODUODENOSCOPY  2006   small hiatus hernia  ([facility])   ESOPHAGOGASTRODUODENOSCOPY N/A 02/21/2018   Procedure: ESOPHAGOGASTRODUODENOSCOPY (EGD);  Surgeon: Malissa Hippo, MD;  [facility];  Service: Endoscopy;  Laterality: N/A;  pt knows to arrive at 2:45   INCONTINENCE SURGERY     SKIN SURGERY     right arm scc   svt ablation     TONSILLECTOMY  1972   2ith adenoid   WISDOM TOOTH EXTRACTION  66 yrs old    Family History  Problem Relation Age of Onset   Hypertension Mother    Hypertension Father    Hypertension Sister    Diabetes Sister        type 2   Fibromyalgia Sister    Multiple sclerosis Sister    Proteinuria Sister    GI Bleed Maternal Grandmother    Atrial fibrillation Maternal Grandmother    Heart disease Maternal Grandfather    Diabetes Paternal Grandmother    Stroke Paternal Grandfather    Colon cancer Neg Hx    Esophageal cancer Neg Hx    Rectal cancer Neg Hx    Stomach cancer Neg Hx     Social History   Socioeconomic History   Marital status: Divorced    Spouse name: Not on file  Number of children: 2   Years of education: Not on file   Highest education level: Not on file  Occupational History   Occupation: Programmer, multimedia: [employer]  Tobacco Use   Smoking status: Never   Smokeless tobacco: Never  Vaping Use   Vaping Use: Never used  Substance and Sexual Activity   Alcohol use: No    Alcohol/week: 0.0 standard drinks of alcohol   Drug use: No   Sexual activity: Not Currently    Partners: Male    Comment: lives by self with 4 dogs works at [employer], avoids dairy  Other Topics Concern   Not on file  Social History Narrative   Not on file   Social Determinants of Health   Financial Resource Strain: Not on file  Food Insecurity: Not on file  Transportation Needs: Not on file  Physical Activity: Not on file  Stress: Not on file  Social Connections: Not on file  Intimate Partner Violence: Not on file    Outpatient Medications Prior to Visit  Medication Sig Dispense  Refill   atorvastatin (LIPITOR) 10 MG tablet TAKE 1 TABLET BY MOUTH DAILY 90 tablet 1   bisoprolol (ZEBETA) 5 MG tablet Take 1/2 tablet (2.5 mg total) by mouth daily.  Replaces Metoprolol. 45 tablet 3   blood glucose meter kit and supplies KIT Dispense based on patient and insurance preference. Use up to four times daily as directed. (FOR ICD-9 250.00, 250.01). 1 each 0   Cholecalciferol (VITAMIN D3) 5000 units CAPS Take 5,000 Units by mouth daily.      conjugated estrogens (PREMARIN) vaginal cream Apply small amount to vaginal mucosa twice daily 42.5 g 5   esomeprazole (NEXIUM) 40 MG capsule Take 1 capsule (40 mg total) by mouth daily. 60 capsule 11   eszopiclone (LUNESTA) 2 MG TABS tablet Take 1 tablet by mouth at bedtime as needed for sleep 30 tablet 1   Eszopiclone 3 MG TABS Take 1 tablet (3 mg total) by mouth at bedtime. Take immediately before bedtime 30 tablet 2   linaclotide (LINZESS) 72 MCG capsule Take 1 capsule (72 mcg total) by mouth daily before breakfast. 30 capsule 5   polyethylene glycol (MIRALAX / GLYCOLAX) 17 g packet Take 17 g by mouth daily as needed.     temazepam (RESTORIL) 30 MG capsule TAKE 1 CAPSULE BY MOUTH ONCE DAILY AT BEDTIME 30 capsule 5   tirzepatide (MOUNJARO) 10 MG/0.5ML Pen Inject 10 mg into the skin once a week. 6 mL 5   traZODone (DESYREL) 100 MG tablet Take 2 tablets (200 mg total) by mouth at bedtime. 180 tablet 1   tretinoin (RETIN-A) 0.05 % cream Apply to the face at bedtime 45 g 3   No facility-administered medications prior to visit.    Allergies  Allergen Reactions   Dilaudid [Hydromorphone Hcl] Other (See Comments)    Respiratory , cardiac     ROS See HPI      Objective:    Physical Exam Constitutional:      General: She is not in acute distress.    Appearance: Normal appearance. She is well-developed.  HENT:     Head: Normocephalic and atraumatic.     Right Ear: Tympanic membrane, ear canal and external ear normal.     Left Ear: Ear  canal and external ear normal.     Ears:     Comments: No erythema of left TM, slight dullness of TM noted.  Mouth/Throat:     Mouth: Mucous membranes are moist.     Pharynx: Oropharynx is clear. Uvula midline. No pharyngeal swelling, oropharyngeal exudate or uvula swelling.     Tonsils: 0 on the right. 0 on the left.  Eyes:     General: No scleral icterus. Neck:     Thyroid: No thyromegaly.  Cardiovascular:     Rate and Rhythm: Normal rate.  Pulmonary:     Effort: Pulmonary effort is normal.  Musculoskeletal:     Cervical back: Neck supple.  Lymphadenopathy:     Head:     Right side of head: No preauricular, posterior auricular or occipital adenopathy.     Left side of head: No preauricular, posterior auricular or occipital adenopathy.     Cervical: No cervical adenopathy.     Right cervical: No superficial, deep or posterior cervical adenopathy.    Comments: Tender left cervical lymph nodes without enlargement.   Skin:    General: Skin is warm and dry.  Neurological:     Mental Status: She is alert and oriented to person, place, and time.  Psychiatric:        Mood and Affect: Mood normal.        Behavior: Behavior normal.        Thought Content: Thought content normal.        Judgment: Judgment normal.     BP 109/64 (BP Location: Right Arm, Patient Position: Sitting, Cuff Size: Small)   Pulse 92   Temp 98.1 F (36.7 C) (Oral)   Resp 16   Wt 132 lb (59.9 kg)   LMP  (LMP Unknown)   SpO2 99%   BMI 22.66 kg/m  Wt Readings from Last 3 Encounters:  08/23/22 132 lb (59.9 kg)  07/26/22 130 lb (59 kg)  06/20/22 130 lb 6.4 oz (59.1 kg)       Assessment & Plan:   Problem List Items Addressed This Visit       Unprioritized   Otalgia of left ear - Primary    New.  Rather severe- really bothering her.  Exam is fairly unremarkable.  Recommended that she add flonase 2 sprays each nostril daily for next 1-2 weeks to help promote eustachian tube drainage and add empiric  amoxicillin which will cover in case of strep throat/developing L OM. She is advised to call if symptoms worsen or if symptoms do not improve. Pt verbalizes understanding.       I am having Barnetta Chapel A. Brechtel "Cathey" start on amoxicillin. I am also having her maintain her Vitamin D3, blood glucose meter kit and supplies, tretinoin, esomeprazole, bisoprolol, polyethylene glycol, temazepam, traZODone, atorvastatin, linaclotide, tirzepatide, Premarin, eszopiclone, and Eszopiclone.  Meds ordered this encounter  Medications   amoxicillin (AMOXIL) 500 MG capsule    Sig: Take 1 capsule (500 mg total) by mouth 3 (three) times daily for 10 days.    Dispense:  30 capsule    Refill:  0    Order Specific Question:   Supervising Provider    Answer:   Penni Homans A [[id number]]

## 2022-08-25 ENCOUNTER — Other Ambulatory Visit (HOSPITAL_COMMUNITY): Payer: Self-pay

## 2022-09-06 ENCOUNTER — Other Ambulatory Visit (HOSPITAL_COMMUNITY): Payer: Self-pay

## 2022-09-06 ENCOUNTER — Other Ambulatory Visit: Payer: Self-pay | Admitting: Family Medicine

## 2022-09-06 MED ORDER — TEMAZEPAM 30 MG PO CAPS
30.0000 mg | ORAL_CAPSULE | Freq: Every day | ORAL | 5 refills | Status: DC
  Filled 2022-09-06: qty 30, fill #0
  Filled 2022-09-26: qty 30, 30d supply, fill #0
  Filled 2022-10-26: qty 20, 20d supply, fill #1
  Filled 2022-10-27: qty 10, 10d supply, fill #1
  Filled 2022-11-30: qty 30, 30d supply, fill #2
  Filled 2023-01-01: qty 30, 30d supply, fill #3
  Filled 2023-01-31: qty 30, 30d supply, fill #4
  Filled 2023-02-26: qty 30, 30d supply, fill #5

## 2022-09-13 ENCOUNTER — Other Ambulatory Visit (HOSPITAL_COMMUNITY): Payer: Self-pay

## 2022-09-13 DIAGNOSIS — L650 Telogen effluvium: Secondary | ICD-10-CM | POA: Diagnosis not present

## 2022-09-13 DIAGNOSIS — L218 Other seborrheic dermatitis: Secondary | ICD-10-CM | POA: Diagnosis not present

## 2022-09-27 ENCOUNTER — Other Ambulatory Visit (HOSPITAL_COMMUNITY): Payer: Self-pay

## 2022-10-01 ENCOUNTER — Other Ambulatory Visit (HOSPITAL_COMMUNITY): Payer: Self-pay

## 2022-10-01 NOTE — Assessment & Plan Note (Signed)
Supplement and monitor 

## 2022-10-01 NOTE — Assessment & Plan Note (Signed)
Encouraged DASH or MIND diet, decrease po intake and increase exercise as tolerated. Needs 7-8 hours of sleep nightly. Avoid trans fats, eat small, frequent meals every 4-5 hours with lean proteins, complex carbs and healthy fats. Minimize simple carbs, high fat foods and processed foods 

## 2022-10-01 NOTE — Assessment & Plan Note (Signed)
Tolerating statin, encouraged heart healthy diet, avoid trans fats, minimize simple carbs and saturated fats. Increase exercise as tolerated 

## 2022-10-01 NOTE — Assessment & Plan Note (Addendum)
hgba1c acceptable, minimize simple carbs. Increase exercise as tolerated. Mounjaro 

## 2022-10-01 NOTE — Assessment & Plan Note (Signed)
Encouraged good sleep hygiene such as dark, quiet room. No blue/green glowing lights such as computer screens in bedroom. No alcohol or stimulants in evening. Cut down on caffeine as able. Regular exercise is helpful but not just prior to bed time. Restoril prn

## 2022-10-01 NOTE — Assessment & Plan Note (Signed)
Avoid offending foods, start probiotics. Do not eat large meals in late evening and consider raising head of bed.  

## 2022-10-01 NOTE — Progress Notes (Unsigned)
Subjective:    Patient ID: [name] Jensen, female    DOB: [birth year], 66 y.o.   MRN: [id number]  No chief complaint on file.   HPI Patient is in today for follow up on chronic medical concerns. No recent febrile illness or acute hospitalizatons. Denies CP/palp/SOB/HA/congestion/fevers/GI or GU c/o. Taking meds as prescribed   Past Medical History:  Diagnosis Date  . Adjustment reaction with anxiety and depression 06/12/2014  . Anemia    h/o low ferritin  . Anxiety   . Asthma    environmental triggers  . Chicken pox as a child  . Cough 08/22/2015  . Depression   . Depression with anxiety 06/12/2014  . Diverticulosis   . Ehrlichiosis 6629  . Gallstones   . GERD (gastroesophageal reflux disease)   . Hair loss 03/28/2017  . Hiatal hernia 2007  . History of viral illness   . Hyperlipidemia, mixed 04/11/2015  . Insomnia 08/21/2017  . Lactose intolerance 06/12/2014  . Mumps as a child  . Overweight 08/19/2014  . Rectocele 06/12/2014  . RLS (restless legs syndrome)   . 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 Sessile colonic polyp   . SVT (supraventricular tachycardia) ([place])    a. ablation in 05/2006  . Tachycardia 06/12/2014  . Thyroid disease   . Vitamin D deficiency 08/22/2015    Past Surgical History:  Procedure Laterality Date  . ABDOMINAL HYSTERECTOMY  11-2008   total with cystocele, rectocele repair  . APPENDECTOMY    . CHOLECYSTECTOMY    . COLONOSCOPY  2006   diverticulosis, hemorrhoids ([place])  . ESOPHAGOGASTRODUODENOSCOPY  2006   small hiatus hernia ([place])  . ESOPHAGOGASTRODUODENOSCOPY N/A 02/21/2018   Procedure: ESOPHAGOGASTRODUODENOSCOPY (EGD);  Surgeon: Rogene Houston, MD;  Location: [facility];  Service: Endoscopy;  Laterality: N/A;  pt knows to arrive at 2:45  . INCONTINENCE SURGERY    . SKIN SURGERY     right arm scc  . svt ablation    . TONSILLECTOMY  1972   2ith adenoid  . WISDOM TOOTH EXTRACTION  66 yrs  old    Family History  Problem Relation Age of Onset  . Hypertension Mother   . Hypertension Father   . Hypertension Sister   . Diabetes Sister        type 2  . Fibromyalgia Sister   . Multiple sclerosis Sister   . Proteinuria Sister   . GI Bleed Maternal Grandmother   . Atrial fibrillation Maternal Grandmother   . Heart disease Maternal Grandfather   . Diabetes Paternal Grandmother   . Stroke Paternal Grandfather   . Colon cancer Neg Hx   . Esophageal cancer Neg Hx   . Rectal cancer Neg Hx   . Stomach cancer Neg Hx     Social History   Socioeconomic History  . Marital status: Divorced    Spouse name: Not on file  . Number of children: 2  . Years of education: Not on file  . Highest education level: Not on file  Occupational History  . Occupation: Programmer, multimedia: [employer]  Tobacco Use  . Smoking status: Never  . Smokeless tobacco: Never  Vaping Use  . Vaping Use: Never used  Substance and Sexual Activity  . Alcohol use: No    Alcohol/week: 0.0 standard drinks of alcohol  . Drug use: No  . Sexual activity: Not Currently    Partners: Male    Comment: lives  by self with 4 dogs works at [employer], avoids dairy  Other Topics Concern  . Not on file  Social History Narrative  . Not on file   Social Determinants of Health   Financial Resource Strain: Not on file  Food Insecurity: Not on file  Transportation Needs: Not on file  Physical Activity: Not on file  Stress: Not on file  Social Connections: Not on file  Intimate Partner Violence: Not on file    Outpatient Medications Prior to Visit  Medication Sig Dispense Refill  . atorvastatin (LIPITOR) 10 MG tablet TAKE 1 TABLET BY MOUTH DAILY 90 tablet 1  . bisoprolol (ZEBETA) 5 MG tablet Take 1/2 tablet (2.5 mg total) by mouth daily.  Replaces Metoprolol. 45 tablet 3  . blood glucose meter kit and supplies KIT Dispense based on patient and insurance preference. Use up to four times daily as directed. (FOR  ICD-9 250.00, 250.01). 1 each 0  . Cholecalciferol (VITAMIN D3) 5000 units CAPS Take 5,000 Units by mouth daily.     Marland Kitchen conjugated estrogens (PREMARIN) vaginal cream Apply small amount to vaginal mucosa twice daily 42.5 g 5  . esomeprazole (NEXIUM) 40 MG capsule Take 1 capsule (40 mg total) by mouth daily. 60 capsule 11  . eszopiclone (LUNESTA) 2 MG TABS tablet Take 1 tablet by mouth at bedtime as needed for sleep 30 tablet 1  . Eszopiclone 3 MG TABS Take 1 tablet (3 mg total) by mouth at bedtime. Take immediately before bedtime 30 tablet 2  . linaclotide (LINZESS) 72 MCG capsule Take 1 capsule (72 mcg total) by mouth daily before breakfast. 30 capsule 5  . polyethylene glycol (MIRALAX / GLYCOLAX) 17 g packet Take 17 g by mouth daily as needed.    . temazepam (RESTORIL) 30 MG capsule Take 1 capsule (30 mg total) by mouth at bedtime. 30 capsule 5  . tirzepatide (MOUNJARO) 10 MG/0.5ML Pen Inject 10 mg into the skin once a week. 6 mL 5  . traZODone (DESYREL) 100 MG tablet Take 2 tablets (200 mg total) by mouth at bedtime. 180 tablet 1  . tretinoin (RETIN-A) 0.05 % cream Apply to the face at bedtime 45 g 3   No facility-administered medications prior to visit.    Allergies  Allergen Reactions  . Dilaudid [Hydromorphone Hcl] Other (See Comments)    Respiratory , cardiac     Review of Systems  Constitutional:  Negative for fever and malaise/fatigue.  HENT:  Negative for congestion.   Eyes:  Negative for blurred vision.  Respiratory:  Negative for shortness of breath.   Cardiovascular:  Negative for chest pain, palpitations and leg swelling.  Gastrointestinal:  Negative for abdominal pain, blood in stool and nausea.  Genitourinary:  Negative for dysuria and frequency.  Musculoskeletal:  Negative for falls.  Skin:  Negative for rash.  Neurological:  Negative for dizziness, loss of consciousness and headaches.  Endo/Heme/Allergies:  Negative for environmental allergies.   Psychiatric/Behavioral:  Negative for depression. The patient is not nervous/anxious.       Objective:    Physical Exam Constitutional:      General: She is not in acute distress.    Appearance: She is not diaphoretic.  HENT:     Head: Normocephalic and atraumatic.     Right Ear: External ear normal.     Left Ear: External ear normal.     Nose: Nose normal.     Mouth/Throat:     Pharynx: No oropharyngeal exudate.  Eyes:  General: No scleral icterus.       Right eye: No discharge.        Left eye: No discharge.     Conjunctiva/sclera: Conjunctivae normal.     Pupils: Pupils are equal, round, and reactive to light.  Neck:     Thyroid: No thyromegaly.  Cardiovascular:     Rate and Rhythm: Normal rate and regular rhythm.     Heart sounds: Normal heart sounds. No murmur heard. Pulmonary:     Effort: Pulmonary effort is normal. No respiratory distress.     Breath sounds: Normal breath sounds. No wheezing or rales.  Abdominal:     General: Bowel sounds are normal. There is no distension.     Palpations: Abdomen is soft. There is no mass.     Tenderness: There is no abdominal tenderness.  Musculoskeletal:        General: No tenderness. Normal range of motion.     Cervical back: Normal range of motion and neck supple.  Lymphadenopathy:     Cervical: No cervical adenopathy.  Skin:    General: Skin is warm and dry.     Findings: No rash.  Neurological:     Mental Status: She is alert and oriented to person, place, and time.     Cranial Nerves: No cranial nerve deficit.     Coordination: Coordination normal.     Deep Tendon Reflexes: Reflexes are normal and symmetric. Reflexes normal.   LMP  (LMP Unknown)  Wt Readings from Last 3 Encounters:  08/23/22 132 lb (59.9 kg)  07/26/22 130 lb (59 kg)  06/20/22 130 lb 6.4 oz (59.1 kg)    Diabetic Foot Exam - Simple   No data filed    Lab Results  Component Value Date   WBC 6.8 06/20/2022   HGB 13.0 06/20/2022   HCT 38.5  06/20/2022   PLT 322.0 06/20/2022   GLUCOSE 91 06/20/2022   CHOL 163 06/20/2022   TRIG 95.0 06/20/2022   HDL 58.80 06/20/2022   LDLDIRECT 72.0 04/26/2020   LDLCALC 85 06/20/2022   ALT 21 06/20/2022   AST 18 06/20/2022   NA 138 06/20/2022   K 4.1 06/20/2022   CL 103 06/20/2022   CREATININE 0.69 06/20/2022   BUN 14 06/20/2022   CO2 27 06/20/2022   TSH 2.16 06/20/2022   INR 1.0 11/09/2008   HGBA1C 5.4 06/20/2022   MICROALBUR 3.8 02/24/2022    Lab Results  Component Value Date   TSH 2.16 06/20/2022   Lab Results  Component Value Date   WBC 6.8 06/20/2022   HGB 13.0 06/20/2022   HCT 38.5 06/20/2022   MCV 85.4 06/20/2022   PLT 322.0 06/20/2022   Lab Results  Component Value Date   NA 138 06/20/2022   K 4.1 06/20/2022   CO2 27 06/20/2022   GLUCOSE 91 06/20/2022   BUN 14 06/20/2022   CREATININE 0.69 06/20/2022   BILITOT 0.5 06/20/2022   ALKPHOS 64 06/20/2022   AST 18 06/20/2022   ALT 21 06/20/2022   PROT 7.1 06/20/2022   ALBUMIN 4.5 06/20/2022   CALCIUM 9.4 06/20/2022   ANIONGAP 7 08/17/2021   GFR 92.05 06/20/2022   Lab Results  Component Value Date   CHOL 163 06/20/2022   Lab Results  Component Value Date   HDL 58.80 06/20/2022   Lab Results  Component Value Date   LDLCALC 85 06/20/2022   Lab Results  Component Value Date   TRIG 95.0 06/20/2022   Lab  Results  Component Value Date   CHOLHDL 3 06/20/2022   Lab Results  Component Value Date   HGBA1C 5.4 06/20/2022       Assessment & Plan:   Problem List Items Addressed This Visit     GERD (gastroesophageal reflux disease)    Avoid offending foods, start probiotics. Do not eat large meals in late evening and consider raising head of bed.       Obesity    Encouraged DASH or MIND diet, decrease po intake and increase exercise as tolerated. Needs 7-8 hours of sleep nightly. Avoid trans fats, eat small, frequent meals every 4-5 hours with lean proteins, complex carbs and healthy fats. Minimize  simple carbs, high fat foods and processed foods      Hyperlipidemia, mixed    Tolerating statin, encouraged heart healthy diet, avoid trans fats, minimize simple carbs and saturated fats. Increase exercise as tolerated      Hyperglycemia    hgba1c acceptable, minimize simple carbs. Increase exercise as tolerated. Mounjaro      Insomnia    Encouraged good sleep hygiene such as dark, quiet room. No blue/green glowing lights such as computer screens in bedroom. No alcohol or stimulants in evening. Cut down on caffeine as able. Regular exercise is helpful but not just prior to bed time. Restoril prn      Vitamin D deficiency    Supplement and monitor       I am having [name] Jensen "[name] Jensen" maintain her Vitamin D3, blood glucose meter kit and supplies, tretinoin, esomeprazole, bisoprolol, polyethylene glycol, traZODone, atorvastatin, linaclotide, tirzepatide, Premarin, eszopiclone, Eszopiclone, and temazepam.  No orders of the defined types were placed in this encounter.    Penni Homans, MD

## 2022-10-02 ENCOUNTER — Ambulatory Visit: Payer: 59 | Admitting: Family Medicine

## 2022-10-02 ENCOUNTER — Other Ambulatory Visit (HOSPITAL_COMMUNITY): Payer: Self-pay

## 2022-10-02 VITALS — BP 110/64 | HR 74 | Temp 98.0°F | Resp 16 | Ht 64.0 in | Wt 134.0 lb

## 2022-10-02 DIAGNOSIS — E559 Vitamin D deficiency, unspecified: Secondary | ICD-10-CM

## 2022-10-02 DIAGNOSIS — R739 Hyperglycemia, unspecified: Secondary | ICD-10-CM

## 2022-10-02 DIAGNOSIS — K219 Gastro-esophageal reflux disease without esophagitis: Secondary | ICD-10-CM

## 2022-10-02 DIAGNOSIS — E782 Mixed hyperlipidemia: Secondary | ICD-10-CM

## 2022-10-02 DIAGNOSIS — R509 Fever, unspecified: Secondary | ICD-10-CM

## 2022-10-02 DIAGNOSIS — R109 Unspecified abdominal pain: Secondary | ICD-10-CM

## 2022-10-02 DIAGNOSIS — K5790 Diverticulosis of intestine, part unspecified, without perforation or abscess without bleeding: Secondary | ICD-10-CM | POA: Diagnosis not present

## 2022-10-02 DIAGNOSIS — G4700 Insomnia, unspecified: Secondary | ICD-10-CM | POA: Diagnosis not present

## 2022-10-02 DIAGNOSIS — E6609 Other obesity due to excess calories: Secondary | ICD-10-CM

## 2022-10-02 DIAGNOSIS — G2581 Restless legs syndrome: Secondary | ICD-10-CM

## 2022-10-02 DIAGNOSIS — L659 Nonscarring hair loss, unspecified: Secondary | ICD-10-CM | POA: Diagnosis not present

## 2022-10-02 DIAGNOSIS — R634 Abnormal weight loss: Secondary | ICD-10-CM

## 2022-10-02 DIAGNOSIS — E739 Lactose intolerance, unspecified: Secondary | ICD-10-CM

## 2022-10-02 DIAGNOSIS — F418 Other specified anxiety disorders: Secondary | ICD-10-CM

## 2022-10-02 LAB — COMPREHENSIVE METABOLIC PANEL
ALT: 33 U/L (ref 0–35)
AST: 27 U/L (ref 0–37)
Albumin: 4.6 g/dL (ref 3.5–5.2)
Alkaline Phosphatase: 58 U/L (ref 39–117)
BUN: 18 mg/dL (ref 6–23)
CO2: 25 mEq/L (ref 19–32)
Calcium: 9.9 mg/dL (ref 8.4–10.5)
Chloride: 105 mEq/L (ref 96–112)
Creatinine, Ser: 0.8 mg/dL (ref 0.40–1.20)
GFR: 78 mL/min (ref >60.00)
Glucose, Bld: 91 mg/dL (ref 70–99)
Potassium: 4.5 mEq/L (ref 3.5–5.1)
Sodium: 139 mEq/L (ref 135–145)
Total Bilirubin: 0.6 mg/dL (ref 0.2–1.2)
Total Protein: 7.2 g/dL (ref 6.0–8.3)

## 2022-10-02 LAB — CBC
HCT: 40.5 % (ref 36.0–46.0)
Hemoglobin: 13.6 g/dL (ref 12.0–15.0)
MCHC: 33.5 g/dL (ref 30.0–36.0)
MCV: 85.5 fl (ref 78.0–100.0)
Platelets: 311 10*3/uL (ref 150.0–400.0)
RBC: 4.74 Mil/uL (ref 3.87–5.11)
RDW: 13 % (ref 11.5–15.5)
WBC: 8.8 10*3/uL (ref 4.0–10.5)

## 2022-10-02 LAB — LIPID PANEL
Cholesterol: 168 mg/dL (ref 0–200)
HDL: 62.5 mg/dL (ref >39.00)
LDL Cholesterol: 85 mg/dL (ref 0–99)
NonHDL: 105.95
Total CHOL/HDL Ratio: 3
Triglycerides: 107 mg/dL (ref 0.0–149.0)
VLDL: 21.4 mg/dL (ref 0.0–40.0)

## 2022-10-02 LAB — VITAMIN D 25 HYDROXY (VIT D DEFICIENCY, FRACTURES): VITD: 61.18 ng/mL (ref 30.00–100.00)

## 2022-10-02 LAB — TSH: TSH: 1.45 u[IU]/mL (ref 0.35–5.50)

## 2022-10-02 LAB — HEMOGLOBIN A1C: Hgb A1c MFr Bld: 5.6 % (ref 4.6–6.5)

## 2022-10-02 MED ORDER — TIRZEPATIDE 7.5 MG/0.5ML ~~LOC~~ SOAJ
7.5000 mg | SUBCUTANEOUS | 3 refills | Status: DC
  Filled 2022-10-02: qty 2, 28d supply, fill #0
  Filled 2022-11-09 – 2022-11-15 (×2): qty 2, 28d supply, fill #1
  Filled 2022-12-08: qty 2, 28d supply, fill #2
  Filled 2023-01-05: qty 2, 28d supply, fill #3
  Filled 2023-01-31: qty 2, 28d supply, fill #4
  Filled 2023-02-26: qty 2, 28d supply, fill #5
  Filled 2023-03-21 – 2023-03-26 (×2): qty 2, 28d supply, fill #6
  Filled 2023-04-21: qty 2, 28d supply, fill #7
  Filled 2023-05-25: qty 2, 28d supply, fill #8

## 2022-10-02 NOTE — Assessment & Plan Note (Signed)
Had a flare recently of Diverticulitis and responded to the antibiotics and she is now taking the Linzess daily and has had no more flares. Take a probiotic daily

## 2022-10-02 NOTE — Patient Instructions (Signed)
Probiotics alternate NOW and [employer]    Diverticulitis  Diverticulitis is infection or inflammation of small pouches (diverticula) in the colon that form due to a condition called diverticulosis. Diverticula can trap stool (feces) and bacteria, causing infection and inflammation. Diverticulitis may cause severe stomach pain and diarrhea. It may lead to tissue damage in the colon that causes bleeding or blockage. The diverticula may also burst (rupture) and cause infected stool to enter other areas of the abdomen. What are the causes? This condition is caused by stool becoming trapped in the diverticula, which allows bacteria to grow in the diverticula. This leads to inflammation and infection. What increases the risk? You are more likely to develop this condition if you have diverticulosis. The risk increases if you: Are overweight or obese. Do not get enough exercise. Drink alcohol. Use tobacco products. Eat a diet that has a lot of red meat such as beef, pork, or lamb. Eat a diet that does not include enough fiber. High-fiber foods include fruits, vegetables, beans, nuts, and whole grains. Are over 56 years of age. What are the signs or symptoms? Symptoms of this condition may include: Pain and tenderness in the abdomen. The pain is normally located on the left side of the abdomen, but it may occur in other areas. Fever and chills. Nausea. Vomiting. Cramping. Bloating. Changes in bowel routines. Blood in your stool. How is this diagnosed? This condition is diagnosed based on: Your medical history. A physical exam. Tests to make sure there is nothing else causing your condition. These tests may include: Blood tests. Urine tests. CT scan of the abdomen. How is this treated? Most cases of this condition are mild and can be treated at home. Treatment may include: Taking over-the-counter pain medicines. Following a clear liquid diet. Taking antibiotic medicines by  mouth. Resting. More severe cases may need to be treated at a hospital. Treatment may include: Not eating or drinking. Taking prescription pain medicine. Receiving antibiotic medicines through an IV. Receiving fluids and nutrition through an IV. Surgery. When your condition is under control, your health care provider may recommend that you have a colonoscopy. This is an exam to look at the entire large intestine. During the exam, a lubricated, bendable tube is inserted into the anus and then passed into the rectum, colon, and other parts of the large intestine. A colonoscopy can show how severe your diverticula are and whether something else may be causing your symptoms. Follow these instructions at home: Medicines Take over-the-counter and prescription medicines only as told by your health care provider. These include fiber supplements, probiotics, and stool softeners. If you were prescribed an antibiotic medicine, take it as told by your health care provider. Do not stop taking the antibiotic even if you start to feel better. Ask your health care provider if the medicine prescribed to you requires you to avoid driving or using machinery. Eating and drinking  Follow a full liquid diet or another diet as directed by your health care provider. After your symptoms improve, your health care provider may tell you to change your diet. He or she may recommend that you eat a diet that contains at least 25 grams (25 g) of fiber daily. Fiber makes it easier to pass stool. Healthy sources of fiber include: Berries. One cup contains 4-8 grams of fiber. Beans or lentils. One-half cup contains 5-8 grams of fiber. Green vegetables. One cup contains 4 grams of fiber. Avoid eating red meat. General instructions Do not use any  products that contain nicotine or tobacco, such as cigarettes, e-cigarettes, and chewing tobacco. If you need help quitting, ask your health care provider. Exercise for at least 30  minutes, 3 times each week. You should exercise hard enough to raise your heart rate and break a sweat. Keep all follow-up visits as told by your health care provider. This is important. You may need to have a colonoscopy. Contact a health care provider if: Your pain does not improve. Your bowel movements do not return to normal. Get help right away if: Your pain gets worse. Your symptoms do not get better with treatment. Your symptoms suddenly get worse. You have a fever. You vomit more than one time. You have stools that are bloody, black, or tarry. Summary Diverticulitis is infection or inflammation of small pouches (diverticula) in the colon that form due to a condition called diverticulosis. Diverticula can trap stool (feces) and bacteria, causing infection and inflammation. You are at higher risk for this condition if you have diverticulosis and you eat a diet that does not include enough fiber. Most cases of this condition are mild and can be treated at home. More severe cases may need to be treated at a hospital. When your condition is under control, your health care provider may recommend that you have an exam called a colonoscopy. This exam can show how severe your diverticula are and whether something else may be causing your symptoms. Keep all follow-up visits as told by your health care provider. This is important. This information is not intended to replace advice given to you by your health care provider. Make sure you discuss any questions you have with your health care provider. Document Revised: 09/01/2019 Document Reviewed: 09/01/2019 Elsevier Patient Education  [place].

## 2022-10-02 NOTE — Progress Notes (Signed)
Subjective:   By signing my name below, I, [name] Jensen, attest that this documentation has been prepared under the direction and in the presence of [name] Lukes, MD 10/02/2022     Patient ID: [name] Jensen, female    DOB: 01-14-1956, 66 y.o.   MRN: [id number]   Chief Complaint  Patient presents with   Follow-up    Here for follow up    HPI Patient is in today for follow up on chronic medical concerns. No recent febrile illness or acute hospitalizatons. Denies CP/palp/SOB/HA/congestion/fevers/GI or GU c/o. Taking meds as prescribed   She complains of hair loss on her scalp. She also complains of fatigue. She continues taking mounjaro regularly. She is regularly losing weight while taking it. She is managing a healthy diet. She is monitoring and making sure she meets her protein intake goal daily. She has seen a dermatologist to manage her hair loss and was given an allergy test but she did not complete it. She was given minoxidil and reports mild improvement while taking it.  She reports having a diverticulitis flare ups a couple of months ago and had fevers and abdominal pain.  She continues experiencing constipation regularly and is taking linzess regularly to manage her symptoms. She is not taking probiotics regularly but is willing to start.  She continues taking 200 mg trazodone prior to sleeping every night. She continues taking 30 mg temazepam prior to sleeping. She alternates them and does not take them at the same time. She reports getting around 5 hours of sleep while on this regiment. She had seen a sleep specialist and was given a sleep test and reports she did not need a CPAP machine. She was given a mouth guard and reports doing well while taking it.    Past Medical History:  Diagnosis Date   Adjustment reaction with anxiety and depression 06/12/2014   Anemia    h/o low ferritin   Anxiety    Asthma    environmental triggers   Chicken pox as a child   Cough  08/22/2015   Depression    Depression with anxiety 06/12/2014   Diverticulosis    Ehrlichiosis 5053   Gallstones    GERD (gastroesophageal reflux disease)    Hair loss 03/28/2017   Hiatal hernia 2007   History of viral illness    Hyperlipidemia, mixed 04/11/2015   Insomnia 08/21/2017   Lactose intolerance 06/12/2014   Mumps as a child   Overweight 08/19/2014   Rectocele 06/12/2014   RLS (restless legs syndrome)    SCC (squamous cell carcinoma) 06/12/2014   Right arm removed 2011   Sessile colonic polyp    SVT (supraventricular tachycardia) ([place])    a. ablation in 05/2006   Tachycardia 06/12/2014   Thyroid disease    Vitamin D deficiency 08/22/2015    Past Surgical History:  Procedure Laterality Date   ABDOMINAL HYSTERECTOMY  11-2008   total with cystocele, rectocele repair   APPENDECTOMY     CHOLECYSTECTOMY     COLONOSCOPY  2006   diverticulosis, hemorrhoids ([facility])   ESOPHAGOGASTRODUODENOSCOPY  2006   small hiatus hernia ([facility])   ESOPHAGOGASTRODUODENOSCOPY N/A 02/21/2018   Procedure: ESOPHAGOGASTRODUODENOSCOPY (EGD);  Surgeon: Rogene Houston, MD;  [facility];  Service: Endoscopy;  Laterality: N/A;  pt knows to arrive at 2:45   [place]     right arm scc   svt ablation     TONSILLECTOMY  1972   2ith adenoid   WISDOM TOOTH EXTRACTION  66 yrs old    Family History  Problem Relation Age of Onset   Hypertension Mother    Hypertension Father    Hypertension Sister    Diabetes Sister        type 2   Fibromyalgia Sister    Multiple sclerosis Sister    Proteinuria Sister    GI Bleed Maternal Grandmother    Atrial fibrillation Maternal Grandmother    Heart disease Maternal Grandfather    Diabetes Paternal Grandmother    Stroke Paternal Grandfather    Colon cancer Neg Hx    Esophageal cancer Neg Hx    Rectal cancer Neg Hx    Stomach cancer Neg Hx     Social History   Socioeconomic History   Marital status:  Divorced    Spouse name: Not on file   Number of children: 2   Years of education: Not on file   Highest education level: Not on file  Occupational History   Occupation: Programmer, multimedia: [employer]  Tobacco Use   Smoking status: Never   Smokeless tobacco: Never  Vaping Use   Vaping Use: Never used  Substance and Sexual Activity   Alcohol use: No    Alcohol/week: 0.0 standard drinks of alcohol   Drug use: No   Sexual activity: Not Currently    Partners: Male    Comment: lives by self with 4 dogs works at [employer], avoids dairy  Other Topics Concern   Not on file  Social History Narrative   Not on file   Social Determinants of Health   Financial Resource Strain: Not on file  Food Insecurity: Not on file  Transportation Needs: Not on file  Physical Activity: Not on file  Stress: Not on file  Social Connections: Not on file  Intimate Partner Violence: Not on file    Outpatient Medications Prior to Visit  Medication Sig Dispense Refill   atorvastatin (LIPITOR) 10 MG tablet TAKE 1 TABLET BY MOUTH DAILY 90 tablet 1   bisoprolol (ZEBETA) 5 MG tablet Take 1/2 tablet (2.5 mg total) by mouth daily.  Replaces Metoprolol. 45 tablet 3   blood glucose meter kit and supplies KIT Dispense based on patient and insurance preference. Use up to four times daily as directed. (FOR ICD-9 250.00, 250.01). 1 each 0   Cholecalciferol (VITAMIN D3) 5000 units CAPS Take 5,000 Units by mouth daily.      conjugated estrogens (PREMARIN) vaginal cream Apply small amount to vaginal mucosa twice daily 42.5 g 5   esomeprazole (NEXIUM) 40 MG capsule Take 1 capsule (40 mg total) by mouth daily. 60 capsule 11   eszopiclone (LUNESTA) 2 MG TABS tablet Take 1 tablet by mouth at bedtime as needed for sleep 30 tablet 1   Eszopiclone 3 MG TABS Take 1 tablet (3 mg total) by mouth at bedtime. Take immediately before bedtime 30 tablet 2   linaclotide (LINZESS) 72 MCG capsule Take 1 capsule (72 mcg total) by mouth  daily before breakfast. 30 capsule 5   temazepam (RESTORIL) 30 MG capsule Take 1 capsule (30 mg total) by mouth at bedtime. 30 capsule 5   traZODone (DESYREL) 100 MG tablet Take 2 tablets (200 mg total) by mouth at bedtime. 180 tablet 1   tretinoin (RETIN-A) 0.05 % cream Apply to the face at bedtime 45 g 3   tirzepatide (MOUNJARO) 10 MG/0.5ML Pen Inject 10 mg into the  skin once a week. 6 mL 5   polyethylene glycol (MIRALAX / GLYCOLAX) 17 g packet Take 17 g by mouth daily as needed.     No facility-administered medications prior to visit.    Allergies  Allergen Reactions   Dilaudid [Hydromorphone Hcl] Other (See Comments)    Respiratory , cardiac     Review of Systems  Constitutional:  Positive for malaise/fatigue. Negative for fever.  HENT:  Negative for congestion.   Respiratory:  Negative for shortness of breath.   Cardiovascular:  Negative for chest pain, palpitations and leg swelling.  Gastrointestinal:  Negative for abdominal pain, blood in stool and nausea.  Genitourinary:  Negative for dysuria and frequency.  Musculoskeletal:  Negative for falls.  Skin:  Negative for rash.       (+)hair loss  Neurological:  Negative for dizziness, loss of consciousness and headaches.  Endo/Heme/Allergies:  Negative for environmental allergies.  Psychiatric/Behavioral:  Negative for depression. The patient has insomnia. The patient is not nervous/anxious.        Objective:    Physical Exam Constitutional:      General: She is not in acute distress.    Appearance: She is not diaphoretic.  HENT:     Head: Normocephalic and atraumatic.     Right Ear: External ear normal.     Left Ear: External ear normal.     Nose: Nose normal.     Mouth/Throat:     Pharynx: No oropharyngeal exudate.  Eyes:     General: No scleral icterus.       Right eye: No discharge.        Left eye: No discharge.     Conjunctiva/sclera: Conjunctivae normal.     Pupils: Pupils are equal, round, and reactive to  light.  Neck:     Thyroid: No thyromegaly.  Cardiovascular:     Rate and Rhythm: Normal rate and regular rhythm.     Heart sounds: Normal heart sounds. No murmur heard. Pulmonary:     Effort: Pulmonary effort is normal. No respiratory distress.     Breath sounds: Normal breath sounds. No wheezing or rales.  Abdominal:     General: Bowel sounds are normal. There is no distension.     Palpations: Abdomen is soft. There is no mass.     Tenderness: There is no abdominal tenderness.  Musculoskeletal:        General: No tenderness. Normal range of motion.     Cervical back: Normal range of motion and neck supple.  Lymphadenopathy:     Cervical: No cervical adenopathy.  Skin:    General: Skin is warm and dry.     Findings: No rash.  Neurological:     Mental Status: She is alert and oriented to person, place, and time.     Cranial Nerves: No cranial nerve deficit.     Coordination: Coordination normal.     Deep Tendon Reflexes: Reflexes are normal and symmetric. Reflexes normal.    BP 110/64 (BP Location: Right Arm, Patient Position: Sitting, Cuff Size: Normal)   Pulse 74   Temp 98 F (36.7 C) (Oral)   Resp 16   Ht _0  (1.626 m)   Wt 134 lb (60.8 kg)   LMP  (LMP Unknown)   SpO2 97%   BMI 23.00 kg/m  Wt Readings from Last 3 Encounters:  10/02/22 134 lb (60.8 kg)  08/23/22 132 lb (59.9 kg)  07/26/22 130 lb (59 kg)    Diabetic Foot  Exam - Simple   No data filed    Lab Results  Component Value Date   WBC 6.8 06/20/2022   HGB 13.0 06/20/2022   HCT 38.5 06/20/2022   PLT 322.0 06/20/2022   GLUCOSE 91 06/20/2022   CHOL 163 06/20/2022   TRIG 95.0 06/20/2022   HDL 58.80 06/20/2022   LDLDIRECT 72.0 04/26/2020   LDLCALC 85 06/20/2022   ALT 21 06/20/2022   AST 18 06/20/2022   NA 138 06/20/2022   K 4.1 06/20/2022   CL 103 06/20/2022   CREATININE 0.69 06/20/2022   BUN 14 06/20/2022   CO2 27 06/20/2022   TSH 2.16 06/20/2022   INR 1.0 11/09/2008   HGBA1C 5.4 06/20/2022    MICROALBUR 3.8 02/24/2022    Lab Results  Component Value Date   TSH 2.16 06/20/2022   Lab Results  Component Value Date   WBC 6.8 06/20/2022   HGB 13.0 06/20/2022   HCT 38.5 06/20/2022   MCV 85.4 06/20/2022   PLT 322.0 06/20/2022   Lab Results  Component Value Date   NA 138 06/20/2022   K 4.1 06/20/2022   CO2 27 06/20/2022   GLUCOSE 91 06/20/2022   BUN 14 06/20/2022   CREATININE 0.69 06/20/2022   BILITOT 0.5 06/20/2022   ALKPHOS 64 06/20/2022   AST 18 06/20/2022   ALT 21 06/20/2022   PROT 7.1 06/20/2022   ALBUMIN 4.5 06/20/2022   CALCIUM 9.4 06/20/2022   ANIONGAP 7 08/17/2021   GFR 92.05 06/20/2022   Lab Results  Component Value Date   CHOL 163 06/20/2022   Lab Results  Component Value Date   HDL 58.80 06/20/2022   Lab Results  Component Value Date   LDLCALC 85 06/20/2022   Lab Results  Component Value Date   TRIG 95.0 06/20/2022   Lab Results  Component Value Date   CHOLHDL 3 06/20/2022   Lab Results  Component Value Date   HGBA1C 5.4 06/20/2022       Assessment & Plan:   Problem List Items Addressed This Visit     RESTLESS LEG SYNDROME    Restoril prn      Lactose intolerance    Drinking CORE life and trying to get 90 gm of protein      Depression with anxiety    Trazodone 200 mg qhs      GERD (gastroesophageal reflux disease)    Avoid offending foods, start probiotics. Do not eat large meals in late evening and consider raising head of bed.       Relevant Orders   CBC   TSH   Obesity    Encouraged DASH or MIND diet, decrease po intake and increase exercise as tolerated. Needs 7-8 hours of sleep nightly. Avoid trans fats, eat small, frequent meals every 4-5 hours with lean proteins, complex carbs and healthy fats. Minimize simple carbs, high fat foods and processed foods      Relevant Medications   tirzepatide (MOUNJARO) 7.5 MG/0.5ML Pen   Abdominal pain   Relevant Orders   CT Abdomen Pelvis W Contrast   Hyperlipidemia,  mixed    Tolerating statin, encouraged heart healthy diet, avoid trans fats, minimize simple carbs and saturated fats. Increase exercise as tolerated      Relevant Orders   Lipid panel   Hyperglycemia    hgba1c acceptable, minimize simple carbs. Increase exercise as tolerated. Mounjaro      Relevant Orders   CBC   Comprehensive metabolic panel   TSH  Hemoglobin A1c   Insulin, random   Hair loss - Primary    Likely multifactorial and attributable to her significant weight loss as well as stress.  Her dermatologist has asked that a ferritin/iron panel be run so this is ordered today.  She will continue a multivitamin with minerals and a fatty acid is encouraged.  They have been using low-dose minoxidil and topical treatments with some results.      Relevant Orders   Iron, TIBC and Ferritin Panel   Insomnia    Encouraged good sleep hygiene such as dark, quiet room. No blue/green glowing lights such as computer screens in bedroom. No alcohol or stimulants in evening. Cut down on caffeine as able. Regular exercise is helpful but not just prior to bed time. Restoril prn for RLS will move this to earler int he evening. She gets Lunesta from psychiatry and she still has trouble sleeping.      Vitamin D deficiency    Supplement and monitor      Relevant Orders   VITAMIN D 25 Hydroxy (Vit-D Deficiency, Fractures)   Diverticulosis    Had a flare recently of Diverticulitis and responded to the antibiotics and she is now taking the Linzess daily and has had no more flares. Take a probiotic daily      Other Visit Diagnoses     Fever, unspecified fever cause       Relevant Orders   CT Abdomen Pelvis W Contrast   Weight loss       Relevant Orders   CT Abdomen Pelvis W Contrast       I have discontinued Barnetta Chapel A. Purrington "Cathey"'s polyethylene glycol and tirzepatide. I am also having her start on tirzepatide. Additionally, I am having her maintain her Vitamin D3, blood glucose  meter kit and supplies, tretinoin, esomeprazole, bisoprolol, traZODone, atorvastatin, linaclotide, Premarin, eszopiclone, Eszopiclone, and temazepam.  Meds ordered this encounter  Medications   tirzepatide (MOUNJARO) 7.5 MG/0.5ML Pen    Sig: Inject 7.5 mg into the skin once a week.    Dispense:  6 mL    Refill:  3   I, Penni Homans, MD, personally preformed the services described in this documentation.  All medical record entries made by the scribe were at my direction and in my presence.  I have reviewed the chart and discharge instructions (if applicable) and agree that the record reflects my personal performance and is accurate and complete. 10/02/2022   I,[name] Jensen,acting as a scribe for Penni Homans, MD.,have documented all relevant documentation on the behalf of Penni Homans, MD,as directed by  Penni Homans, MD while in the presence of Penni Homans, MD.   Penni Homans, MD

## 2022-10-02 NOTE — Progress Notes (Deleted)
error 

## 2022-10-02 NOTE — Assessment & Plan Note (Signed)
Restoril prn

## 2022-10-02 NOTE — Assessment & Plan Note (Signed)
Drinking CORE life and trying to get 90 gm of protein

## 2022-10-02 NOTE — Assessment & Plan Note (Signed)
Likely multifactorial and attributable to her significant weight loss as well as stress.  Her dermatologist has asked that a ferritin/iron panel be run so this is ordered today.  She will continue a multivitamin with minerals and a fatty acid is encouraged.  They have been using low-dose minoxidil and topical treatments with some results.

## 2022-10-02 NOTE — Assessment & Plan Note (Signed)
Trazodone 200 mg qhs

## 2022-10-03 ENCOUNTER — Encounter: Payer: Self-pay | Admitting: Family Medicine

## 2022-10-03 LAB — IRON,TIBC AND FERRITIN PANEL
%SAT: 29 % (calc) (ref 16–45)
Ferritin: 274 ng/mL (ref 16–288)
Iron: 95 ug/dL (ref 45–160)
TIBC: 330 mcg/dL (calc) (ref 250–450)

## 2022-10-03 LAB — INSULIN, RANDOM: Insulin: 4.7 u[IU]/mL

## 2022-10-04 ENCOUNTER — Ambulatory Visit (HOSPITAL_BASED_OUTPATIENT_CLINIC_OR_DEPARTMENT_OTHER)
Admission: RE | Admit: 2022-10-04 | Discharge: 2022-10-04 | Disposition: A | Discharge status: Home / Self Care | Payer: 59 | Source: Ambulatory Visit | Attending: Family Medicine | Admitting: Family Medicine

## 2022-10-04 DIAGNOSIS — R634 Abnormal weight loss: Secondary | ICD-10-CM | POA: Diagnosis not present

## 2022-10-04 DIAGNOSIS — R109 Unspecified abdominal pain: Secondary | ICD-10-CM | POA: Insufficient documentation

## 2022-10-04 DIAGNOSIS — R509 Fever, unspecified: Secondary | ICD-10-CM | POA: Diagnosis not present

## 2022-10-04 DIAGNOSIS — N289 Disorder of kidney and ureter, unspecified: Secondary | ICD-10-CM | POA: Diagnosis not present

## 2022-10-04 DIAGNOSIS — K5730 Diverticulosis of large intestine without perforation or abscess without bleeding: Secondary | ICD-10-CM | POA: Diagnosis not present

## 2022-10-04 DIAGNOSIS — K760 Fatty (change of) liver, not elsewhere classified: Secondary | ICD-10-CM | POA: Diagnosis not present

## 2022-10-04 MED ORDER — IOHEXOL 300 MG/ML  SOLN
100.0000 mL | Freq: Once | INTRAMUSCULAR | Status: AC | PRN
  Administered 2022-10-04: 100 mL via INTRAVENOUS

## 2022-10-06 ENCOUNTER — Encounter: Payer: Self-pay | Admitting: Family Medicine

## 2022-10-06 ENCOUNTER — Other Ambulatory Visit (HOSPITAL_COMMUNITY): Payer: Self-pay

## 2022-10-06 ENCOUNTER — Encounter: Payer: Self-pay | Admitting: Gastroenterology

## 2022-10-06 NOTE — Telephone Encounter (Signed)
Called [facility] for them to give [place] call back  And that we need pt results updated

## 2022-10-08 ENCOUNTER — Other Ambulatory Visit: Payer: Self-pay | Admitting: Pulmonary Disease

## 2022-10-08 ENCOUNTER — Other Ambulatory Visit: Payer: Self-pay | Admitting: Family Medicine

## 2022-10-08 DIAGNOSIS — E782 Mixed hyperlipidemia: Secondary | ICD-10-CM

## 2022-10-08 MED ORDER — METRONIDAZOLE 500 MG PO TABS
500.0000 mg | ORAL_TABLET | Freq: Two times a day (BID) | ORAL | 0 refills | Status: AC
  Filled 2022-10-08: qty 14, 7d supply, fill #0

## 2022-10-08 MED ORDER — CIPROFLOXACIN HCL 500 MG PO TABS
500.0000 mg | ORAL_TABLET | Freq: Two times a day (BID) | ORAL | 0 refills | Status: AC
  Filled 2022-10-08: qty 14, 7d supply, fill #0

## 2022-10-09 ENCOUNTER — Other Ambulatory Visit (HOSPITAL_COMMUNITY): Payer: Self-pay

## 2022-10-09 ENCOUNTER — Other Ambulatory Visit: Payer: Self-pay

## 2022-10-09 MED ORDER — ESOMEPRAZOLE MAGNESIUM 40 MG PO CPDR
40.0000 mg | DELAYED_RELEASE_CAPSULE | Freq: Every day | ORAL | 11 refills | Status: DC
  Filled 2022-10-09: qty 60, 60d supply, fill #0

## 2022-10-09 MED ORDER — ESZOPICLONE 2 MG PO TABS: ORAL_TABLET | ORAL | 1 refills | Status: DC

## 2022-10-09 MED ORDER — TRAZODONE HCL 100 MG PO TABS
200.0000 mg | ORAL_TABLET | Freq: Every day | ORAL | 1 refills | Status: DC
  Filled 2022-10-09: qty 180, 90d supply, fill #0
  Filled 2022-12-08 – 2023-01-05 (×2): qty 180, 90d supply, fill #1

## 2022-10-09 MED ORDER — ATORVASTATIN CALCIUM 10 MG PO TABS
10.0000 mg | ORAL_TABLET | Freq: Every day | ORAL | 1 refills | Status: DC
  Filled 2022-10-09: qty 90, fill #0
  Filled 2022-12-08: qty 90, 90d supply, fill #0
  Filled 2023-03-04: qty 90, 90d supply, fill #1

## 2022-10-09 NOTE — Telephone Encounter (Signed)
Called [facility] that we haven't heard from pt CT report yet Need a call back

## 2022-10-11 ENCOUNTER — Other Ambulatory Visit (HOSPITAL_COMMUNITY): Payer: Self-pay | Admitting: Family Medicine

## 2022-10-11 DIAGNOSIS — Z1231 Encounter for screening mammogram for malignant neoplasm of breast: Secondary | ICD-10-CM

## 2022-10-14 ENCOUNTER — Encounter (INDEPENDENT_AMBULATORY_CARE_PROVIDER_SITE_OTHER): Payer: Self-pay | Admitting: Gastroenterology

## 2022-10-19 ENCOUNTER — Other Ambulatory Visit (HOSPITAL_COMMUNITY): Payer: Self-pay

## 2022-10-20 ENCOUNTER — Ambulatory Visit (HOSPITAL_COMMUNITY)
Admission: RE | Admit: 2022-10-20 | Discharge: 2022-10-20 | Disposition: A | Discharge status: Home / Self Care | Payer: 59 | Source: Ambulatory Visit | Attending: Radiology | Admitting: Radiology

## 2022-10-20 DIAGNOSIS — N958 Other specified menopausal and perimenopausal disorders: Secondary | ICD-10-CM | POA: Diagnosis not present

## 2022-10-20 DIAGNOSIS — M8589 Other specified disorders of bone density and structure, multiple sites: Secondary | ICD-10-CM | POA: Diagnosis not present

## 2022-10-20 DIAGNOSIS — Z780 Asymptomatic menopausal state: Secondary | ICD-10-CM | POA: Diagnosis not present

## 2022-10-25 ENCOUNTER — Other Ambulatory Visit (HOSPITAL_COMMUNITY): Payer: Self-pay

## 2022-10-26 ENCOUNTER — Other Ambulatory Visit (HOSPITAL_COMMUNITY): Payer: Self-pay

## 2022-10-27 ENCOUNTER — Other Ambulatory Visit: Payer: Self-pay | Admitting: Pulmonary Disease

## 2022-10-27 ENCOUNTER — Other Ambulatory Visit (HOSPITAL_COMMUNITY): Payer: Self-pay

## 2022-10-30 ENCOUNTER — Other Ambulatory Visit (HOSPITAL_COMMUNITY): Payer: Self-pay

## 2022-10-30 MED ORDER — ESZOPICLONE 2 MG PO TABS
2.0000 mg | ORAL_TABLET | Freq: Every evening | ORAL | 1 refills | Status: DC | PRN
  Filled 2022-10-30: qty 30, 30d supply, fill #0
  Filled 2022-11-30 – 2022-12-06 (×3): qty 30, 30d supply, fill #1

## 2022-11-08 ENCOUNTER — Encounter: Payer: Self-pay | Admitting: Pulmonary Disease

## 2022-11-08 ENCOUNTER — Ambulatory Visit: Payer: 59 | Admitting: Pulmonary Disease

## 2022-11-08 VITALS — BP 110/62 | HR 86 | Temp 98.1°F | Ht 64.0 in | Wt 132.0 lb

## 2022-11-08 DIAGNOSIS — G478 Other sleep disorders: Secondary | ICD-10-CM | POA: Diagnosis not present

## 2022-11-08 NOTE — Progress Notes (Signed)
[name] Jensen    [id number]    06-Jul-1956  Primary Care Physician:Blyth, Bonnita Levan, MD  Referring Physician: [name], MD [address] [place],  [place] [id number]  Chief complaint:   Concern for obstructive sleep apnea after she followed up with her dentist  HPI:  She had a recent sleep study that was negative for significant sleep apnea Still has significant issues with her sleep at night  Started on Lunesta which she has been trying to use infrequently but uses it whenever she feels she needs to  Previous sleep study in 2016 did reveal mild obstructive sleep apnea, 1 that was done about 10 years ago was inconclusive Still has a dry mouth, parched lips Using a new dental device that was fashioned, she finds it bulky but trying to get used to it  Still wakes up sometimes feeling nonrestorative  Sometimes feels profoundly sleepy following getting off of work in the early evening and will fall asleep early and sleep for few hours before waking up, this usually affects nighttime sleep  Lunesta 2 mg helps about half the time  Dad snored  Memory is fine  Active at work, planning on retiring a couple years  Usually goes to bed about 10 PM-11 PM, takes about 30 minutes to fall asleep, about 2 awakenings Final wake up time about 5:30 AM  She is down about 45 pounds recently   Outpatient Encounter Medications as of 11/08/2022  Medication Sig   atorvastatin (LIPITOR) 10 MG tablet Take 1 tablet (10 mg total) by mouth daily.   bisoprolol (ZEBETA) 5 MG tablet Take 1/2 tablet (2.5 mg total) by mouth daily.  Replaces Metoprolol.   blood glucose meter kit and supplies KIT Dispense based on patient and insurance preference. Use up to four times daily as directed. (FOR ICD-9 250.00, 250.01).   Cholecalciferol (VITAMIN D3) 5000 units CAPS Take 5,000 Units by mouth daily.    conjugated estrogens (PREMARIN) vaginal cream Apply small amount to vaginal  mucosa twice daily   esomeprazole (NEXIUM) 40 MG capsule Take 1 capsule (40 mg total) by mouth daily.   eszopiclone (LUNESTA) 2 MG TABS tablet Take 1 tablet (2 mg total) by mouth at bedtime as needed for sleep.   Eszopiclone 3 MG TABS Take 1 tablet (3 mg total) by mouth at bedtime. Take immediately before bedtime   linaclotide (LINZESS) 72 MCG capsule Take 1 capsule (72 mcg total) by mouth daily before breakfast.   temazepam (RESTORIL) 30 MG capsule Take 1 capsule (30 mg total) by mouth at bedtime.   tirzepatide (MOUNJARO) 7.5 MG/0.5ML Pen Inject 7.5 mg into the skin once a week.   traZODone (DESYREL) 100 MG tablet Take 2 tablets (200 mg total) by mouth at bedtime.   tretinoin (RETIN-A) 0.05 % cream Apply to the face at bedtime   eszopiclone (LUNESTA) 2 MG TABS tablet Take 1 tablet by mouth at bedtime as needed for sleep (Patient not taking: Reported on 11/08/2022)   No facility-administered encounter medications on file as of 11/08/2022.    Allergies as of 11/08/2022 - Review Complete 11/08/2022  Allergen Reaction Noted   Dilaudid [hydromorphone hcl] Other (See Comments) 09/23/2011    Past Medical History:  Diagnosis Date   Adjustment reaction with anxiety and depression 06/12/2014   Anemia    h/o low ferritin   Anxiety    Asthma  environmental triggers   Chicken pox as a child   Cough 08/22/2015   Depression    Depression with anxiety 06/12/2014   Diverticulosis    Ehrlichiosis 4403   Gallstones    GERD (gastroesophageal reflux disease)    Hair loss 03/28/2017   Hiatal hernia 2007   History of viral illness    Hyperlipidemia, mixed 04/11/2015   Insomnia 08/21/2017   Lactose intolerance 06/12/2014   Mumps as a child   Overweight 08/19/2014   Rectocele 06/12/2014   RLS (restless legs syndrome)    SCC (squamous cell carcinoma) 06/12/2014   Right arm removed 2011   Sessile colonic polyp    SVT (supraventricular tachycardia)    a. ablation in 05/2006   Tachycardia  06/12/2014   Thyroid disease    Vitamin D deficiency 08/22/2015    Past Surgical History:  Procedure Laterality Date   ABDOMINAL HYSTERECTOMY  11-2008   total with cystocele, rectocele repair   APPENDECTOMY     CHOLECYSTECTOMY     COLONOSCOPY  2006   diverticulosis, hemorrhoids ([facility])   ESOPHAGOGASTRODUODENOSCOPY  2006   small hiatus hernia ([facility])   ESOPHAGOGASTRODUODENOSCOPY N/A 02/21/2018   Procedure: ESOPHAGOGASTRODUODENOSCOPY ([facility]);  Surgeon: Rogene Houston, MD;  [facility];  Service: Endoscopy;  Laterality: N/A;  pt knows to arrive at 2:45   [place]     right arm scc   svt ablation     TONSILLECTOMY  1972   2ith adenoid   WISDOM TOOTH EXTRACTION  66 yrs old    Family History  Problem Relation Age of Onset   Hypertension Mother    Hypertension Father    Hypertension Sister    Diabetes Sister        type 2   Fibromyalgia Sister    Multiple sclerosis Sister    Proteinuria Sister    GI Bleed Maternal Grandmother    Atrial fibrillation Maternal Grandmother    Heart disease Maternal Grandfather    Diabetes Paternal Grandmother    Stroke Paternal Grandfather    Colon cancer Neg Hx    Esophageal cancer Neg Hx    Rectal cancer Neg Hx    Stomach cancer Neg Hx     Social History   Socioeconomic History   Marital status: Divorced    Spouse name: Not on file   Number of children: 2   Years of education: Not on file   Highest education level: Not on file  Occupational History   Occupation: Programmer, multimedia: [employer]  Tobacco Use   Smoking status: Never   Smokeless tobacco: Never  Vaping Use   Vaping Use: Never used  Substance and Sexual Activity   Alcohol use: No    Alcohol/week: 0.0 standard drinks of alcohol   Drug use: No   Sexual activity: Not Currently    Partners: Male    Comment: lives by self with 4 dogs works at [employer], avoids dairy  Other Topics Concern   Not on file  Social History Narrative    Not on file   Social Determinants of Health   Financial Resource Strain: Not on file  Food Insecurity: Not on file  Transportation Needs: Not on file  Physical Activity: Not on file  Stress: Not on file  Social Connections: Not on file  Intimate Partner Violence: Not on file    Review of Systems  Constitutional:  Positive for fatigue.  Psychiatric/Behavioral:  Positive for sleep disturbance.     Vitals:   11/08/22 0941  BP: 110/62  Pulse: 86  Temp: 98.1 F (36.7 C)  SpO2: 97%     Physical Exam Constitutional:      Appearance: Normal appearance.  HENT:     Head: Normocephalic.     Mouth/Throat:     Mouth: Mucous membranes are moist.     Comments: Macroglossia, Mallampati 2 Eyes:     Pupils: Pupils are equal, round, and reactive to light.  Cardiovascular:     Rate and Rhythm: Normal rate and regular rhythm.     Heart sounds: No murmur heard.    No friction rub.  Pulmonary:     Effort: No respiratory distress.     Breath sounds: No stridor. No wheezing or rhonchi.  Musculoskeletal:     Cervical back: No rigidity or tenderness.  Neurological:     Mental Status: She is alert.  Psychiatric:        Mood and Affect: Mood normal.       12/14/2021    3:00 PM  Results of the Epworth flowsheet  Sitting and reading 3  Watching TV 3  Sitting, inactive in a public place (e.g. a theatre or a meeting) 0  As a passenger in a car for an hour without a break 1  Lying down to rest in the afternoon when circumstances permit 3  Sitting and talking to someone 0  Sitting quietly after a lunch without alcohol 0  In a car, while stopped for a few minutes in traffic 0  Total score 10   Data Reviewed: Her sleep study from 02/05/2015 did reveal mild obstructive sleep apnea with an AHI of 7.5  Echo 05/06/6947 shows diastolic dysfunction  Recent sleep study from 02/02/2022 reviewed personally showing negative study for significant obstructive sleep apnea, no significant oxygen  desaturations  Assessment:  Excessive daytime sleepiness  Nonrestorative sleep with multiple awakenings  Snoring and dry mouth  Most recent sleep study was negative  She has been using Lunesta as needed at 2 mg  History of bruxism, on a dental guard device  Plan/Recommendations:  Continue Lunesta 2 mg  Follow-up in about 6 months  Call with any significant concerns   Sherrilyn Rist MD [facility] 11/08/2022, 10:06 AM  CC: [name], MD

## 2022-11-08 NOTE — Patient Instructions (Signed)
I will see you about 6 months from now  Be liberal with your use of Lunesta  Consider scheduled naps to help with the sleepiness in the evenings and to still be able to get a good nights rest  Call with significant concerns

## 2022-11-10 ENCOUNTER — Other Ambulatory Visit (HOSPITAL_COMMUNITY): Payer: Self-pay

## 2022-11-14 ENCOUNTER — Other Ambulatory Visit (HOSPITAL_COMMUNITY): Payer: Self-pay

## 2022-11-15 ENCOUNTER — Other Ambulatory Visit (HOSPITAL_COMMUNITY): Payer: Self-pay

## 2022-11-22 ENCOUNTER — Other Ambulatory Visit (HOSPITAL_COMMUNITY): Payer: Self-pay

## 2022-11-29 ENCOUNTER — Ambulatory Visit (HOSPITAL_COMMUNITY): Payer: 59

## 2022-12-01 ENCOUNTER — Other Ambulatory Visit (HOSPITAL_COMMUNITY): Payer: Self-pay

## 2022-12-01 ENCOUNTER — Other Ambulatory Visit: Payer: Self-pay

## 2022-12-06 ENCOUNTER — Other Ambulatory Visit: Payer: Self-pay | Admitting: Pulmonary Disease

## 2022-12-06 ENCOUNTER — Other Ambulatory Visit (HOSPITAL_COMMUNITY): Payer: Self-pay

## 2022-12-06 ENCOUNTER — Other Ambulatory Visit: Payer: Self-pay

## 2022-12-06 ENCOUNTER — Ambulatory Visit (HOSPITAL_COMMUNITY)
Admission: RE | Admit: 2022-12-06 | Discharge: 2022-12-06 | Disposition: A | Discharge status: Home / Self Care | Payer: Commercial Managed Care - PPO | Source: Ambulatory Visit | Attending: Family Medicine | Admitting: Family Medicine

## 2022-12-06 DIAGNOSIS — Z1231 Encounter for screening mammogram for malignant neoplasm of breast: Secondary | ICD-10-CM | POA: Insufficient documentation

## 2022-12-07 ENCOUNTER — Telehealth: Payer: Self-pay

## 2022-12-07 ENCOUNTER — Encounter: Payer: Self-pay | Admitting: Family Medicine

## 2022-12-07 NOTE — Telephone Encounter (Signed)
PA initiated via Covermymeds; KEY: [id number]   Information regarding your request Member should be able to get the drug/product without a PA at this time.

## 2022-12-08 ENCOUNTER — Other Ambulatory Visit (HOSPITAL_COMMUNITY): Payer: Self-pay

## 2022-12-08 ENCOUNTER — Other Ambulatory Visit: Payer: Self-pay

## 2022-12-08 MED ORDER — ESZOPICLONE 3 MG PO TABS
3.0000 mg | ORAL_TABLET | Freq: Every day | ORAL | 2 refills | Status: DC
  Filled 2022-12-08 – 2023-01-05 (×2): qty 30, 30d supply, fill #0

## 2022-12-11 ENCOUNTER — Other Ambulatory Visit (HOSPITAL_COMMUNITY): Payer: Self-pay

## 2022-12-14 ENCOUNTER — Other Ambulatory Visit (HOSPITAL_COMMUNITY): Payer: Self-pay

## 2022-12-21 ENCOUNTER — Other Ambulatory Visit: Payer: Self-pay | Admitting: Gastroenterology

## 2022-12-21 ENCOUNTER — Other Ambulatory Visit: Payer: Self-pay

## 2022-12-21 ENCOUNTER — Other Ambulatory Visit (HOSPITAL_COMMUNITY): Payer: Self-pay

## 2022-12-21 MED ORDER — LINACLOTIDE 145 MCG PO CAPS
145.0000 ug | ORAL_CAPSULE | Freq: Every day | ORAL | 3 refills | Status: DC
  Filled 2022-12-21: qty 30, 30d supply, fill #0
  Filled 2023-02-26: qty 90, 90d supply, fill #1

## 2023-01-01 ENCOUNTER — Telehealth: Payer: Self-pay | Admitting: Physician Assistant

## 2023-01-01 ENCOUNTER — Other Ambulatory Visit (HOSPITAL_COMMUNITY): Payer: Self-pay

## 2023-01-01 ENCOUNTER — Other Ambulatory Visit: Payer: Self-pay

## 2023-01-01 NOTE — Telephone Encounter (Signed)
Covering B Strader's inbox. Received notification of expiring echo order - at last [place] 01/2022 [facility] had recommended repeat study in 1 year. I have extended the echo order but will route to [facility] to help ensure this gets scheduled.

## 2023-01-02 NOTE — Telephone Encounter (Signed)
Echo scheduled for 01/12/23. Pt aware of appt.

## 2023-01-05 ENCOUNTER — Other Ambulatory Visit: Payer: Self-pay

## 2023-01-08 ENCOUNTER — Other Ambulatory Visit: Payer: Self-pay

## 2023-01-12 ENCOUNTER — Ambulatory Visit (HOSPITAL_COMMUNITY)
Admission: RE | Admit: 2023-01-12 | Discharge: 2023-01-12 | Disposition: A | Discharge status: Home / Self Care | Payer: Commercial Managed Care - PPO | Source: Ambulatory Visit | Attending: Student | Admitting: Student

## 2023-01-12 DIAGNOSIS — I351 Nonrheumatic aortic (valve) insufficiency: Secondary | ICD-10-CM

## 2023-01-12 LAB — ECHOCARDIOGRAM COMPLETE
Area-P 1/2: 2.82 cm2
Calc EF: 48.3 %
Est EF: 45
P 1/2 time: 509 msec
S' Lateral: 3.2 cm
Single Plane A2C EF: 47.5 %
Single Plane A4C EF: 47.3 %

## 2023-01-12 NOTE — Progress Notes (Signed)
  Echocardiogram 2D Echocardiogram has been performed.  [name] Jensen 01/12/2023, 5:10 PM

## 2023-01-18 ENCOUNTER — Other Ambulatory Visit: Payer: Self-pay | Admitting: Pulmonary Disease

## 2023-01-19 ENCOUNTER — Other Ambulatory Visit: Payer: Self-pay

## 2023-01-19 ENCOUNTER — Other Ambulatory Visit (HOSPITAL_COMMUNITY): Payer: Self-pay

## 2023-01-19 ENCOUNTER — Other Ambulatory Visit: Payer: Self-pay | Admitting: Pulmonary Disease

## 2023-01-19 MED ORDER — ESZOPICLONE 2 MG PO TABS
2.0000 mg | ORAL_TABLET | Freq: Every evening | ORAL | 1 refills | Status: DC | PRN
  Filled 2023-01-19: qty 30, 30d supply, fill #0

## 2023-01-19 NOTE — Telephone Encounter (Signed)
Please advise on refill request

## 2023-01-23 ENCOUNTER — Other Ambulatory Visit: Payer: Self-pay | Admitting: Pulmonary Disease

## 2023-01-23 ENCOUNTER — Other Ambulatory Visit (HOSPITAL_COMMUNITY): Payer: Self-pay

## 2023-01-25 ENCOUNTER — Other Ambulatory Visit (HOSPITAL_COMMUNITY): Payer: Self-pay

## 2023-01-25 MED ORDER — ESZOPICLONE 2 MG PO TABS
2.0000 mg | ORAL_TABLET | Freq: Every evening | ORAL | 1 refills | Status: DC | PRN
  Filled 2023-01-25: qty 30, 30d supply, fill #0

## 2023-01-25 NOTE — Telephone Encounter (Signed)
Requested Renewals   eszopiclone (LUNESTA) 2 MG TABS tablet       The original prescription was discontinued on 01/19/2023 by Laurin Coder, MD. Renewing this prescription may not be appropriate.   Sig: Take 1 tablet (2 mg total) by mouth at bedtime as needed for sleep.   Disp: 30 tablet    Refills: 1   Start: 01/23/2023   Class: Normal   Last ordered: 6 days ago (01/19/2023) by Laurin Coder, MD   Last dispensed: Not dispensed   Rx #: [id number]   Pharmacy comment: Good afternoon. There was a rx sent in for Lunesta 65m a few days ago but has been discontinued. The pt is requesting a refill. If possible, can you send in a new rx? Thank you      Dr. OJenetta Downer please advise.

## 2023-01-27 ENCOUNTER — Other Ambulatory Visit (HOSPITAL_COMMUNITY): Payer: Self-pay

## 2023-01-28 NOTE — Assessment & Plan Note (Signed)
Supplement and monitor 

## 2023-01-28 NOTE — Assessment & Plan Note (Signed)
hgba1c acceptable, minimize simple carbs. Increase exercise as tolerated. Darcel Bayley

## 2023-01-28 NOTE — Assessment & Plan Note (Signed)
Tolerating statin, encouraged heart healthy diet, avoid trans fats, minimize simple carbs and saturated fats. Increase exercise as tolerated 

## 2023-01-28 NOTE — Assessment & Plan Note (Signed)
Encouraged DASH or MIND diet, decrease po intake and increase exercise as tolerated. Needs 7-8 hours of sleep nightly. Avoid trans fats, eat small, frequent meals every 4-5 hours with lean proteins, complex carbs and healthy fats. Minimize simple carbs, high fat foods and processed foods 

## 2023-01-28 NOTE — Assessment & Plan Note (Signed)
Encouraged good sleep hygiene such as dark, quiet room. No blue/green glowing lights such as computer screens in bedroom. No alcohol or stimulants in evening. Cut down on caffeine as able. Regular exercise is helpful but not just prior to bed time. Restoril prn for RLS will move this to earler int he evening. She gets Lunesta from psychiatry and she still has trouble sleeping.

## 2023-01-28 NOTE — Assessment & Plan Note (Signed)
Stable on current meds 

## 2023-01-29 ENCOUNTER — Ambulatory Visit: Payer: Commercial Managed Care - PPO | Admitting: Cardiology

## 2023-01-29 ENCOUNTER — Encounter: Payer: Self-pay | Admitting: Pulmonary Disease

## 2023-01-29 ENCOUNTER — Ambulatory Visit: Payer: Commercial Managed Care - PPO | Admitting: Family Medicine

## 2023-01-29 ENCOUNTER — Other Ambulatory Visit (HOSPITAL_COMMUNITY): Payer: Self-pay

## 2023-01-29 VITALS — BP 124/74 | HR 83 | Temp 97.8°F | Resp 16 | Ht 64.0 in | Wt 132.2 lb

## 2023-01-29 DIAGNOSIS — M2550 Pain in unspecified joint: Secondary | ICD-10-CM

## 2023-01-29 DIAGNOSIS — E6609 Other obesity due to excess calories: Secondary | ICD-10-CM | POA: Diagnosis not present

## 2023-01-29 DIAGNOSIS — R739 Hyperglycemia, unspecified: Secondary | ICD-10-CM

## 2023-01-29 DIAGNOSIS — F418 Other specified anxiety disorders: Secondary | ICD-10-CM | POA: Diagnosis not present

## 2023-01-29 DIAGNOSIS — E559 Vitamin D deficiency, unspecified: Secondary | ICD-10-CM

## 2023-01-29 DIAGNOSIS — I38 Endocarditis, valve unspecified: Secondary | ICD-10-CM

## 2023-01-29 DIAGNOSIS — J45909 Unspecified asthma, uncomplicated: Secondary | ICD-10-CM

## 2023-01-29 DIAGNOSIS — G4700 Insomnia, unspecified: Secondary | ICD-10-CM | POA: Diagnosis not present

## 2023-01-29 DIAGNOSIS — E782 Mixed hyperlipidemia: Secondary | ICD-10-CM

## 2023-01-29 LAB — COMPREHENSIVE METABOLIC PANEL
ALT: 21 U/L (ref 0–35)
AST: 18 U/L (ref 0–37)
Albumin: 4.1 g/dL (ref 3.5–5.2)
Alkaline Phosphatase: 60 U/L (ref 39–117)
BUN: 11 mg/dL (ref 6–23)
CO2: 27 mEq/L (ref 19–32)
Calcium: 9.5 mg/dL (ref 8.4–10.5)
Chloride: 104 mEq/L (ref 96–112)
Creatinine, Ser: 0.68 mg/dL (ref 0.40–1.20)
GFR: 91.98 mL/min (ref >60.00)
Glucose, Bld: 88 mg/dL (ref 70–99)
Potassium: 4 mEq/L (ref 3.5–5.1)
Sodium: 139 mEq/L (ref 135–145)
Total Bilirubin: 0.4 mg/dL (ref 0.2–1.2)
Total Protein: 6.8 g/dL (ref 6.0–8.3)

## 2023-01-29 LAB — VITAMIN D 25 HYDROXY (VIT D DEFICIENCY, FRACTURES): VITD: 66.06 ng/mL (ref 30.00–100.00)

## 2023-01-29 LAB — CBC WITH DIFFERENTIAL/PLATELET
Basophils Absolute: 0 10*3/uL (ref 0.0–0.1)
Basophils Relative: 0.2 % (ref 0.0–3.0)
Eosinophils Absolute: 0.1 10*3/uL (ref 0.0–0.7)
Eosinophils Relative: 0.8 % (ref 0.0–5.0)
HCT: 37.9 % (ref 36.0–46.0)
Hemoglobin: 12.9 g/dL (ref 12.0–15.0)
Lymphocytes Relative: 43.8 % (ref 12.0–46.0)
Lymphs Abs: 2.9 10*3/uL (ref 0.7–4.0)
MCHC: 34 g/dL (ref 30.0–36.0)
MCV: 83.9 fl (ref 78.0–100.0)
Monocytes Absolute: 0.2 10*3/uL (ref 0.1–1.0)
Monocytes Relative: 3.6 % (ref 3.0–12.0)
Neutro Abs: 3.4 10*3/uL (ref 1.4–7.7)
Neutrophils Relative %: 51.6 % (ref 43.0–77.0)
Platelets: 311 10*3/uL (ref 150.0–400.0)
RBC: 4.52 Mil/uL (ref 3.87–5.11)
RDW: 12.9 % (ref 11.5–15.5)
WBC: 6.6 10*3/uL (ref 4.0–10.5)

## 2023-01-29 LAB — LIPID PANEL
Cholesterol: 147 mg/dL (ref 0–200)
HDL: 51.4 mg/dL (ref >39.00)
LDL Cholesterol: 72 mg/dL (ref 0–99)
NonHDL: 95.29
Total CHOL/HDL Ratio: 3
Triglycerides: 117 mg/dL (ref 0.0–149.0)
VLDL: 23.4 mg/dL (ref 0.0–40.0)

## 2023-01-29 LAB — TSH: TSH: 1.36 u[IU]/mL (ref 0.35–5.50)

## 2023-01-29 LAB — HEMOGLOBIN A1C: Hgb A1c MFr Bld: 5.4 % (ref 4.6–6.5)

## 2023-01-29 LAB — SEDIMENTATION RATE: Sed Rate: 10 mm/hr (ref 0–30)

## 2023-01-29 NOTE — Patient Instructions (Signed)
Joint Pain Joint pain may be caused by many things. Joint pain is likely to go away when you follow instructions from your health care provider for relieving pain at home. However, joint pain can also be caused by conditions that require more treatment. Common causes of joint pain include: Bruising in the area of the joint. Injury caused by repeating certain movements too many times. Age-related joint wear and tear. Buildup of uric acid crystals in the joint (gout). Inflammation of the joint. Other forms of arthritis. Infections of the joint or of the bone. Your health care provider may recommend that you take pain medicine or wear a supportive device like an elastic bandage, sling, or splint. If your joint pain continues, you may need lab or imaging tests to diagnose the cause of your joint pain. Follow these instructions at home: Managing pain, stiffness, and swelling     If directed, put ice on the painful area. To do this: If you have a removable elastic bandage, sling, or splint, take it off as told by your doctor. Put ice in a plastic bag. Place a towel between your skin and the bag. Leave the ice on for 20 minutes, 2-3 times a day. Remove the ice if your skin turns bright red. This is very important. If you cannot feel pain, heat, or cold, you have a greater risk of damage to the area. Move your fingers and toes often to reduce stiffness and swelling. Raise the injured area above the level of your heart while you are sitting or lying down. If directed, apply heat to the painful area as often as told by your health care provider. Use the heat source that your health care provider recommends, such as a moist heat pack or a heating pad. Place a towel between your skin and the heat source. Leave the heat on for 20-30 minutes. Remove the heat if your skin turns bright red. This is especially important if you are unable to feel pain, heat, or cold. You have a greater risk of getting  burned.  Activity Rest as told by your health care provider. Do not do anything that causes or worsens pain. Begin exercising or stretching the affected area as told by your health care provider. Return to your normal activities as told by your health care provider. Ask your health care provider what activities are safe for you. If you have an elastic bandage, sling, or splint: Wear the bandage, sling, or splint as told by your health care provider. Remove it only as told by your health care provider. Loosen it if your fingers or toes below the joint tingle, become numb, or turn cold and blue. Keep it clean. Ask your health care provider if you should remove it before bathing. If the bandage, sling, or splint is not waterproof: Do not let it get wet. Cover it with a watertight covering when you take a bath or shower. General instructions Treatment may include medicines for pain and inflammation that are taken by mouth or applied to the skin. Take over-the-counter and prescription medicines only as told by your health care provider. Do not use any products that contain nicotine or tobacco, such as cigarettes, e-cigarettes, and chewing tobacco. If you need help quitting, ask your health care provider. Keep all follow-up visits. This is important. Contact a health care provider if: You have pain that gets worse and does not get better with medicine. Your joint pain does not improve within 3 days. You have increased   bruising or swelling. You have a fever. You lose 10 lb (4.5 kg) or more without trying. Get help right away if: You cannot move the joint. Your fingers or toes tingle, become numb, or turn cold and blue. You have a fever along with a joint that is red, warm, and swollen. Summary Joint pain may be caused by many things. Your health care provider may recommend that you take pain medicine or wear a supportive device such as an elastic bandage, sling, or splint. If your joint pain  continues, you may need tests to diagnose the cause of your joint pain. Take over-the-counter and prescription medicines only as told by your health care provider. This information is not intended to replace advice given to you by your health care provider. Make sure you discuss any questions you have with your health care provider. Document Revised: 03/03/2020 Document Reviewed: 03/03/2020 Elsevier Patient Education  2023 [employer]  

## 2023-01-29 NOTE — Telephone Encounter (Signed)
The pharmacist raised significant concerns about multiple medications that may interact with each other and different doses of the same medication  Lunesta, trazodone, Restoril-  all with the same effect of sleepiness.  Will not refill the 2 mg at present

## 2023-01-30 ENCOUNTER — Encounter: Payer: Self-pay | Admitting: Cardiology

## 2023-01-30 ENCOUNTER — Ambulatory Visit: Payer: Commercial Managed Care - PPO | Admitting: Cardiology

## 2023-01-30 ENCOUNTER — Ambulatory Visit: Payer: Commercial Managed Care - PPO | Attending: Cardiology | Admitting: Cardiology

## 2023-01-30 VITALS — BP 107/63 | HR 96 | Ht 64.0 in | Wt 129.0 lb

## 2023-01-30 DIAGNOSIS — R002 Palpitations: Secondary | ICD-10-CM

## 2023-01-30 DIAGNOSIS — I471 Supraventricular tachycardia, unspecified: Secondary | ICD-10-CM | POA: Diagnosis not present

## 2023-01-30 DIAGNOSIS — E782 Mixed hyperlipidemia: Secondary | ICD-10-CM | POA: Diagnosis not present

## 2023-01-30 DIAGNOSIS — I351 Nonrheumatic aortic (valve) insufficiency: Secondary | ICD-10-CM

## 2023-01-30 LAB — APOLIPOPROTEIN B: Apolipoprotein B: 65 mg/dL (ref <90)

## 2023-01-30 LAB — INSULIN, RANDOM: Insulin: 9.4 u[IU]/mL

## 2023-01-30 NOTE — Patient Instructions (Signed)
Medication Instructions:  Your physician recommends that you continue on your current medications as directed. Please refer to the Current Medication list given to you today.   Labwork: None  Testing/Procedures: None  Follow-Up: Follow up with Dr. Branch in 1 year.   Any Other Special Instructions Will Be Listed Below (If Applicable).     If you need a refill on your cardiac medications before your next appointment, please call your pharmacy.  

## 2023-01-30 NOTE — Progress Notes (Signed)
Clinical Summary Ms. Stariha is a 67 y.o.female seen today for follow up of the following medical problems  1.SVT - history of prior ablation - notes mention has had some inappropriate sinus tach at times -chest pressure and headache on bystolic. Had tolerated toprol previously. Did not tolerate CCB in the past.  - 01/2022 monitor 12 days: PACs, couplets and triplets rare. Rare PVCs. No significant arrhythmias  - has been on bisoprolol 2.'5mg'$  daily - typically 4AM in mornings, different from SVT.  - limiting caffeine - can have orthostatic symptoms at times. 117 to 90 with standing on one bp check, has to be very consistent with hydration to prevent.      2. Aortic regurgitation - Jan 2023 echo: LVEF 50-55%, mild to mod AI 01/2023 echo: LVEF 45%, grade I dd, moderate AI - reviewed echo images. Patient also came back few days later for additional images which I reviewed, the additional images wall motion better visualized suggesting LVEF 50-55%    3. Hyperlipidemia - she is on atorvastatin '10mg'$  daily - 01/29/23 TC 147 TG 117 HDL 51 LDL 72 - 10 year ASCVD risk 4.1%  4. Weight loss - down about 70 lbs - has been on mounjaro   Past Medical History:  Diagnosis Date   Adjustment reaction with anxiety and depression 06/12/2014   Anemia    h/o low ferritin   Anxiety    Asthma    environmental triggers   Chicken pox as a child   Cough 08/22/2015   Depression    Depression with anxiety 06/12/2014   Diverticulosis    Ehrlichiosis [id number]   Gallstones    GERD (gastroesophageal reflux disease)    Hair loss 03/28/2017   Hiatal hernia 2007   History of viral illness    Hyperlipidemia, mixed 04/11/2015   Insomnia 08/21/2017   Lactose intolerance 06/12/2014   Mumps as a child   Overweight 08/19/2014   Rectocele 06/12/2014   RLS (restless legs syndrome)    SCC (squamous cell carcinoma) 06/12/2014   Right arm removed 2011   Sessile colonic polyp    SVT (supraventricular  tachycardia)    a. ablation in 05/2006   Tachycardia 06/12/2014   Thyroid disease    Vitamin D deficiency 08/22/2015     Allergies  Allergen Reactions   Dilaudid [Hydromorphone Hcl] Other (See Comments)    Respiratory , cardiac      Current Outpatient Medications  Medication Sig Dispense Refill   atorvastatin (LIPITOR) 10 MG tablet Take 1 tablet (10 mg total) by mouth daily. 90 tablet 1   bisoprolol (ZEBETA) 5 MG tablet Take 1/2 tablet (2.5 mg total) by mouth daily.  Replaces Metoprolol. 45 tablet 3   blood glucose meter kit and supplies KIT Dispense based on patient and insurance preference. Use up to four times daily as directed. (FOR ICD-9 250.00, 250.01). 1 each 0   Cholecalciferol (VITAMIN D3) 5000 units CAPS Take 5,000 Units by mouth daily.      conjugated estrogens (PREMARIN) vaginal cream Apply small amount to vaginal mucosa twice daily 42.5 g 5   esomeprazole (NEXIUM) 40 MG capsule Take 1 capsule (40 mg total) by mouth daily. 60 capsule 11   eszopiclone (LUNESTA) 2 MG TABS tablet Take 1 tablet (2 mg total) by mouth at bedtime as needed for sleep. 30 tablet 1   Eszopiclone 3 MG TABS Take 1 tablet (3 mg total) by mouth at bedtime. Take immediately before bedtime 30 tablet 2  linaclotide (LINZESS) 145 MCG CAPS capsule Take 1 capsule by mouth daily before breakfast. 30 capsule 3   temazepam (RESTORIL) 30 MG capsule Take 1 capsule (30 mg total) by mouth at bedtime. 30 capsule 5   tirzepatide (MOUNJARO) 7.5 MG/0.5ML Pen Inject 7.5 mg into the skin once a week. 6 mL 3   traZODone (DESYREL) 100 MG tablet Take 2 tablets (200 mg total) by mouth at bedtime. 180 tablet 1   tretinoin (RETIN-A) 0.05 % cream Apply to the face at bedtime 45 g 3   No current facility-administered medications for this visit.     Past Surgical History:  Procedure Laterality Date   ABDOMINAL HYSTERECTOMY  11-2008   total with cystocele, rectocele repair   APPENDECTOMY     CHOLECYSTECTOMY      COLONOSCOPY  2006   diverticulosis, hemorrhoids ([facility])   ESOPHAGOGASTRODUODENOSCOPY  2006   small hiatus hernia ([facility])   ESOPHAGOGASTRODUODENOSCOPY N/A 02/21/2018   Procedure: ESOPHAGOGASTRODUODENOSCOPY (EGD);  Surgeon: Rogene Houston, MD;  Location: [facility];  Service: Endoscopy;  Laterality: N/A;  pt knows to arrive at 2:45   [place]     right arm scc   svt ablation     TONSILLECTOMY  1972   2ith adenoid   WISDOM TOOTH EXTRACTION  67 yrs old     Allergies  Allergen Reactions   Dilaudid [Hydromorphone Hcl] Other (See Comments)    Respiratory , cardiac       Family History  Problem Relation Age of Onset   Hypertension Mother    Hypertension Father    Hypertension Sister    Diabetes Sister        type 2   Fibromyalgia Sister    Multiple sclerosis Sister    Proteinuria Sister    GI Bleed Maternal Grandmother    Atrial fibrillation Maternal Grandmother    Heart disease Maternal Grandfather    Diabetes Paternal Grandmother    Stroke Paternal Grandfather    Colon cancer Neg Hx    Esophageal cancer Neg Hx    Rectal cancer Neg Hx    Stomach cancer Neg Hx      Social History Ms. Lazcano reports that she has never smoked. She has never used smokeless tobacco. Ms. Thammavong reports no history of alcohol use.   Review of Systems CONSTITUTIONAL: No weight loss, fever, chills, weakness or fatigue.  HEENT: Eyes: No visual loss, blurred vision, double vision or yellow sclerae.No hearing loss, sneezing, congestion, runny nose or sore throat.  SKIN: No rash or itching.  CARDIOVASCULAR: per hpi RESPIRATORY: some SOB GASTROINTESTINAL: No anorexia, nausea, vomiting or diarrhea. No abdominal pain or blood.  GENITOURINARY: No burning on urination, no polyuria NEUROLOGICAL: No headache, dizziness, syncope, paralysis, ataxia, numbness or tingling in the extremities. No change in bowel or bladder control.  MUSCULOSKELETAL: No muscle, back pain,  joint pain or stiffness.  LYMPHATICS: No enlarged nodes. No history of splenectomy.  PSYCHIATRIC: No history of depression or anxiety.  ENDOCRINOLOGIC: No reports of sweating, cold or heat intolerance. No polyuria or polydipsia.  Marland Kitchen   Physical Examination Today's Vitals   01/30/23 1433  BP: 107/63  Pulse: 96  SpO2: 99%  Weight: 129 lb (58.5 kg)  Height: '5\' 4"'$  (1.626 m)   Body mass index is 22.14 kg/m.  Gen: resting comfortably, no acute distress HEENT: no scleral icterus, pupils equal round and reactive, no palptable cervical adenopathy,  CV: RRR, no mrg, no jvd  Resp: Clear to auscultation bilaterally GI: abdomen is soft, non-tender, non-distended, normal bowel sounds, no hepatosplenomegaly MSK: extremities are warm, no edema.  Skin: warm, no rash Neuro:  no focal deficits Psych: appropriate affect   Diagnostic Studies  01/2018 coronary CTA IMPRESSION: 1. Coronary calcium score of 0. This was 0 percentile for age and sex matched control.   2. Normal coronary origin with right dominance.   3. No evidence of CAD.   4. Mildly dilated pulmonary artery measuring 33 mm suggestive of pulmonary hypertension.      01/2023 echo  1. Left ventricular ejection fraction, by estimation, is 45%. The left  ventricle has mildly decreased function. The left ventricle demonstrates  global hypokinesis. Left ventricular diastolic parameters are consistent  with Grade I diastolic dysfunction  (impaired relaxation).   2. Right ventricular systolic function was not well visualized. The right  ventricular size is normal. Tricuspid regurgitation signal is inadequate  for assessing PA pressure.   3. The mitral valve is normal in structure. No evidence of mitral valve  regurgitation. No evidence of mitral stenosis.   4. The aortic valve is tricuspid. Aortic valve regurgitation is moderate.  No aortic stenosis is present.   5. The inferior vena cava is normal in size with greater than 50%   respiratory variability, suggesting right atrial pressure of 3 mmHg.   Assessment and Plan  Palpitations/SVT - some elevated heart rates first thing in the morning, can feel hot flashes.  - recent monitor with just benign ectopy - limited on beta blocker dosing due to low bp's at times, orthostatic symptoms at times - continue bisoprolol - EKG today shows NSR  2. Aortic regurgitation - modarete AI by echo, on further review of echo and additonal images LVEF remains 50-55% - repeat echo 1 year  3. Hyperlipidemia - at goal, continue atorvastatin   4.DOE - chronic symptoms - extensive benign cardiac workup over the last few years with coroanry CTA, echo, heart monitor without significant pathology - no history of smoking. suspect deconditioning, encouraged increased cardio exercise 30 min a day 5 days a week   F/u 1  year      Arnoldo Lenis, M.D.

## 2023-01-30 NOTE — Addendum Note (Signed)
Addended by: Berlinda Last on: 01/30/2023 04:17 PM   Modules accepted: Orders

## 2023-01-31 ENCOUNTER — Other Ambulatory Visit: Payer: Self-pay | Admitting: Student

## 2023-01-31 DIAGNOSIS — I38 Endocarditis, valve unspecified: Secondary | ICD-10-CM | POA: Insufficient documentation

## 2023-01-31 DIAGNOSIS — M2550 Pain in unspecified joint: Secondary | ICD-10-CM | POA: Insufficient documentation

## 2023-01-31 NOTE — Assessment & Plan Note (Signed)
Worse in Wrists and knees she tried stopping her statin to see if that would help but it did not. Encouraged moist heat and gentle stretching as tolerated. May try NSAIDs and prescription meds as directed and report if symptoms worsen or seek immediate care

## 2023-01-31 NOTE — Assessment & Plan Note (Signed)
No recent exacerbtion

## 2023-01-31 NOTE — Assessment & Plan Note (Signed)
Noted Aortic Insufficiency mild-mod but she did have a Syncopal episode yesterday. She feels weel for now.

## 2023-01-31 NOTE — Progress Notes (Signed)
Subjective:    Patient ID: [name] Jensen, female    DOB: 1956/02/08, 67 y.o.   MRN: [id number]  Chief Complaint  Patient presents with   Follow-up    Follow up    HPI Patient is in today for follow up on chronic medical concerns. No recent febrile illness or hospitalizations. Denies CP/palp/SOB/HA/congestion/fevers/GI or GU c/o. Taking meds as prescribed. Overall she is doing well. She did have an episode of syncope yesterday with position change. She also noted an episode of palpitations last night without associated symptoms.   Past Medical History:  Diagnosis Date   Adjustment reaction with anxiety and depression 06/12/2014   Anemia    h/o low ferritin   Anxiety    Asthma    environmental triggers   Chicken pox as a child   Cough 08/22/2015   Depression    Depression with anxiety 06/12/2014   Diverticulosis    Ehrlichiosis 123456   Gallstones    GERD (gastroesophageal reflux disease)    Hair loss 03/28/2017   Hiatal hernia 2007   History of viral illness    Hyperlipidemia, mixed 04/11/2015   Insomnia 08/21/2017   Lactose intolerance 06/12/2014   Mumps as a child   Overweight 08/19/2014   Rectocele 06/12/2014   RLS (restless legs syndrome)    SCC (squamous cell carcinoma) 06/12/2014   Right arm removed 2011   Sessile colonic polyp    SVT (supraventricular tachycardia)    a. ablation in 05/2006   Tachycardia 06/12/2014   Thyroid disease    Vitamin D deficiency 08/22/2015    Past Surgical History:  Procedure Laterality Date   ABDOMINAL HYSTERECTOMY  11-2008   total with cystocele, rectocele repair   APPENDECTOMY     CHOLECYSTECTOMY     COLONOSCOPY  2006   diverticulosis, hemorrhoids ([place])   ESOPHAGOGASTRODUODENOSCOPY  2006   small hiatus hernia ([place])   ESOPHAGOGASTRODUODENOSCOPY N/A 02/21/2018   Procedure: ESOPHAGOGASTRODUODENOSCOPY (EGD);  Surgeon: Rogene Houston, MD;  Location: [facility];  Service: Endoscopy;  Laterality: N/A;  pt knows to  arrive at 2:45   [place]     right arm scc   svt ablation     TONSILLECTOMY  1972   2ith adenoid   WISDOM TOOTH EXTRACTION  67 yrs old    Family History  Problem Relation Age of Onset   Hypertension Mother    Hypertension Father    Hypertension Sister    Diabetes Sister        type 2   Fibromyalgia Sister    Multiple sclerosis Sister    Proteinuria Sister    GI Bleed Maternal Grandmother    Atrial fibrillation Maternal Grandmother    Heart disease Maternal Grandfather    Diabetes Paternal Grandmother    Stroke Paternal Grandfather    Colon cancer Neg Hx    Esophageal cancer Neg Hx    Rectal cancer Neg Hx    Stomach cancer Neg Hx     Social History   Socioeconomic History   Marital status: Divorced    Spouse name: Not on file   Number of children: 2   Years of education: Not on file   Highest education level: Not on file  Occupational History   Occupation: Programmer, multimedia: [employer]  Tobacco Use   Smoking status: Never   Smokeless tobacco: Never  Vaping Use   Vaping Use: Never used  Substance and Sexual  Activity   Alcohol use: No    Alcohol/week: 0.0 standard drinks of alcohol   Drug use: No   Sexual activity: Not Currently    Partners: Male    Comment: lives by self with 4 dogs works at [employer], avoids dairy  Other Topics Concern   Not on file  Social History Narrative   Not on file   Social Determinants of Health   Financial Resource Strain: Not on file  Food Insecurity: Not on file  Transportation Needs: Not on file  Physical Activity: Not on file  Stress: Not on file  Social Connections: Not on file  Intimate Partner Violence: Not on file    Outpatient Medications Prior to Visit  Medication Sig Dispense Refill   atorvastatin (LIPITOR) 10 MG tablet Take 1 tablet (10 mg total) by mouth daily. 90 tablet 1   bisoprolol (ZEBETA) 5 MG tablet Take 1/2 tablet (2.5 mg total) by mouth daily.  Replaces Metoprolol. 45  tablet 3   blood glucose meter kit and supplies KIT Dispense based on patient and insurance preference. Use up to four times daily as directed. (FOR ICD-9 250.00, 250.01). 1 each 0   Cholecalciferol (VITAMIN D3) 5000 units CAPS Take 5,000 Units by mouth daily.      conjugated estrogens (PREMARIN) vaginal cream Apply small amount to vaginal mucosa twice daily 42.5 g 5   esomeprazole (NEXIUM) 40 MG capsule Take 1 capsule (40 mg total) by mouth daily. 60 capsule 11   eszopiclone (LUNESTA) 2 MG TABS tablet Take 1 tablet (2 mg total) by mouth at bedtime as needed for sleep. 30 tablet 1   Eszopiclone 3 MG TABS Take 1 tablet (3 mg total) by mouth at bedtime. Take immediately before bedtime 30 tablet 2   linaclotide (LINZESS) 145 MCG CAPS capsule Take 1 capsule by mouth daily before breakfast. 30 capsule 3   temazepam (RESTORIL) 30 MG capsule Take 1 capsule (30 mg total) by mouth at bedtime. 30 capsule 5   tirzepatide (MOUNJARO) 7.5 MG/0.5ML Pen Inject 7.5 mg into the skin once a week. 6 mL 3   traZODone (DESYREL) 100 MG tablet Take 2 tablets (200 mg total) by mouth at bedtime. 180 tablet 1   tretinoin (RETIN-A) 0.05 % cream Apply to the face at bedtime 45 g 3   No facility-administered medications prior to visit.    Allergies  Allergen Reactions   Dilaudid [Hydromorphone Hcl] Other (See Comments)    Respiratory , cardiac     Review of Systems  Constitutional:  Negative for fever and malaise/fatigue.  HENT:  Negative for congestion.   Eyes:  Negative for blurred vision.  Respiratory:  Negative for shortness of breath.   Cardiovascular:  Positive for palpitations. Negative for chest pain and leg swelling.  Gastrointestinal:  Negative for abdominal pain, blood in stool and nausea.  Genitourinary:  Negative for dysuria and frequency.  Musculoskeletal:  Positive for joint pain. Negative for falls.  Skin:  Negative for rash.  Neurological:  Positive for loss of consciousness. Negative for  dizziness and headaches.  Endo/Heme/Allergies:  Negative for environmental allergies.  Psychiatric/Behavioral:  Negative for depression. The patient is not nervous/anxious.        Objective:    Physical Exam  BP 124/74 (BP Location: Right Arm, Patient Position: Sitting, Cuff Size: Normal)   Pulse 83   Temp 97.8 F (36.6 C) (Oral)   Resp 16   Ht '5\' 4"'$  (1.626 m)   Wt 132 lb 3.2  oz (60 kg)   LMP  (LMP Unknown)   SpO2 98%   BMI 22.69 kg/m  Wt Readings from Last 3 Encounters:  01/30/23 129 lb (58.5 kg)  01/29/23 132 lb 3.2 oz (60 kg)  11/08/22 132 lb (59.9 kg)    Diabetic Foot Exam - Simple   No data filed    Lab Results  Component Value Date   WBC 6.6 01/29/2023   HGB 12.9 01/29/2023   HCT 37.9 01/29/2023   PLT 311.0 01/29/2023   GLUCOSE 88 01/29/2023   CHOL 147 01/29/2023   TRIG 117.0 01/29/2023   HDL 51.40 01/29/2023   LDLDIRECT 72.0 04/26/2020   LDLCALC 72 01/29/2023   ALT 21 01/29/2023   AST 18 01/29/2023   NA 139 01/29/2023   K 4.0 01/29/2023   CL 104 01/29/2023   CREATININE 0.68 01/29/2023   BUN 11 01/29/2023   CO2 27 01/29/2023   TSH 1.36 01/29/2023   INR 1.0 11/09/2008   HGBA1C 5.4 01/29/2023   MICROALBUR 3.8 02/24/2022    Lab Results  Component Value Date   TSH 1.36 01/29/2023   Lab Results  Component Value Date   WBC 6.6 01/29/2023   HGB 12.9 01/29/2023   HCT 37.9 01/29/2023   MCV 83.9 01/29/2023   PLT 311.0 01/29/2023   Lab Results  Component Value Date   NA 139 01/29/2023   K 4.0 01/29/2023   CO2 27 01/29/2023   GLUCOSE 88 01/29/2023   BUN 11 01/29/2023   CREATININE 0.68 01/29/2023   BILITOT 0.4 01/29/2023   ALKPHOS 60 01/29/2023   AST 18 01/29/2023   ALT 21 01/29/2023   PROT 6.8 01/29/2023   ALBUMIN 4.1 01/29/2023   CALCIUM 9.5 01/29/2023   ANIONGAP 7 08/17/2021   GFR 91.98 01/29/2023   Lab Results  Component Value Date   CHOL 147 01/29/2023   Lab Results  Component Value Date   HDL 51.40 01/29/2023   Lab Results   Component Value Date   LDLCALC 72 01/29/2023   Lab Results  Component Value Date   TRIG 117.0 01/29/2023   Lab Results  Component Value Date   CHOLHDL 3 01/29/2023   Lab Results  Component Value Date   HGBA1C 5.4 01/29/2023       Assessment & Plan:  Depression with anxiety Assessment & Plan: Stable on current meds   Hyperglycemia Assessment & Plan: hgba1c acceptable, minimize simple carbs. Increase exercise as tolerated. Mounjaro  Orders: -     Insulin, random -     Hemoglobin A1c -     CBC with Differential/Platelet -     Comprehensive metabolic panel -     TSH  Hyperlipidemia, mixed Assessment & Plan: Tolerating statin, encouraged heart healthy diet, avoid trans fats, minimize simple carbs and saturated fats. Increase exercise as tolerated  Orders: -     Lipid panel -     TSH -     Apolipoprotein B  Insomnia, unspecified type Assessment & Plan: Encouraged good sleep hygiene such as dark, quiet room. No blue/green glowing lights such as computer screens in bedroom. No alcohol or stimulants in evening. Cut down on caffeine as able. Regular exercise is helpful but not just prior to bed time. Restoril prn for RLS will move this to earler int he evening. She gets Lunesta from psychiatry and she still has trouble sleeping.   Obesity due to excess calories, unspecified classification, unspecified whether serious comorbidity present Assessment & Plan: Encouraged DASH or MIND  diet, decrease po intake and increase exercise as tolerated. Needs 7-8 hours of sleep nightly. Avoid trans fats, eat small, frequent meals every 4-5 hours with lean proteins, complex carbs and healthy fats. Minimize simple carbs, high fat foods and processed foods    Vitamin D deficiency Assessment & Plan: Supplement and monitor   Orders: -     VITAMIN D 25 Hydroxy (Vit-D Deficiency, Fractures)  Arthralgia, unspecified joint Assessment & Plan: Worse in Wrists and knees she tried stopping  her statin to see if that would help but it did not. Encouraged moist heat and gentle stretching as tolerated. May try NSAIDs and prescription meds as directed and report if symptoms worsen or seek immediate care   Orders: -     Sedimentation rate -     B. burgdorfi antibodies -     Rocky mtn spotted fvr abs pnl(IgG+IgM) -     Ehrlichia antibody panel -     Rheumatoid factor -     CBC with Differential/Platelet  Uncomplicated asthma, unspecified asthma severity, unspecified whether persistent Assessment & Plan: No recent exacerbtion   Valvular heart disease Assessment & Plan: Noted Aortic Insufficiency mild-mod but she did have a Syncopal episode yesterday. She feels weel for now.      Penni Homans, MD

## 2023-02-01 ENCOUNTER — Other Ambulatory Visit (HOSPITAL_COMMUNITY): Payer: Self-pay

## 2023-02-01 ENCOUNTER — Other Ambulatory Visit: Payer: Self-pay

## 2023-02-01 MED ORDER — BISOPROLOL FUMARATE 5 MG PO TABS
2.5000 mg | ORAL_TABLET | Freq: Every day | ORAL | 3 refills | Status: DC
  Filled 2023-02-01 – 2023-03-21 (×2): qty 45, 90d supply, fill #0
  Filled 2023-07-11: qty 45, 90d supply, fill #1
  Filled 2023-10-15: qty 45, 90d supply, fill #2
  Filled 2023-12-24 – 2023-12-26 (×3): qty 45, 90d supply, fill #3

## 2023-02-02 LAB — RHEUMATOID FACTOR: Rheumatoid fact SerPl-aCnc: <14 IU/mL (ref <14)

## 2023-02-02 LAB — ROCKY MTN SPOTTED FVR ABS PNL(IGG+IGM)
RMSF IgG: NOT DETECTED
RMSF IgM: NOT DETECTED

## 2023-02-02 LAB — EHRLICHIA ANTIBODY PANEL
E. CHAFFEENSIS AB IGG: <1:64 {titer}
E. CHAFFEENSIS AB IGM: <1:20 {titer}

## 2023-02-02 LAB — B. BURGDORFI ANTIBODIES: B burgdorferi Ab IgG+IgM: <0.9 index

## 2023-02-05 ENCOUNTER — Encounter: Payer: Self-pay | Admitting: Gastroenterology

## 2023-02-05 ENCOUNTER — Other Ambulatory Visit (HOSPITAL_COMMUNITY): Payer: Self-pay

## 2023-02-06 ENCOUNTER — Other Ambulatory Visit (HOSPITAL_COMMUNITY): Payer: Self-pay

## 2023-02-07 DIAGNOSIS — H04123 Dry eye syndrome of bilateral lacrimal glands: Secondary | ICD-10-CM | POA: Diagnosis not present

## 2023-02-07 DIAGNOSIS — H2513 Age-related nuclear cataract, bilateral: Secondary | ICD-10-CM | POA: Diagnosis not present

## 2023-02-07 DIAGNOSIS — E119 Type 2 diabetes mellitus without complications: Secondary | ICD-10-CM | POA: Diagnosis not present

## 2023-02-07 LAB — HM DIABETES EYE EXAM

## 2023-02-16 ENCOUNTER — Telehealth: Payer: Commercial Managed Care - PPO | Admitting: Family Medicine

## 2023-02-16 DIAGNOSIS — J40 Bronchitis, not specified as acute or chronic: Secondary | ICD-10-CM | POA: Diagnosis not present

## 2023-02-17 MED ORDER — ALBUTEROL SULFATE HFA 108 (90 BASE) MCG/ACT IN AERS: 2.0000 | INHALATION_SPRAY | Freq: Four times a day (QID) | RESPIRATORY_TRACT | 0 refills | Status: DC | PRN

## 2023-02-17 MED ORDER — AZITHROMYCIN 250 MG PO TABS: ORAL_TABLET | ORAL | 0 refills | Status: AC

## 2023-02-17 MED ORDER — BENZONATATE 200 MG PO CAPS: 200.0000 mg | ORAL_CAPSULE | Freq: Two times a day (BID) | ORAL | 0 refills | Status: DC | PRN

## 2023-02-17 NOTE — Progress Notes (Signed)
We are sorry that you are not feeling well.  Here is how we plan to help!  Based on your presentation I believe you most likely have A cough due to bacteria.  When patients have a fever and a productive cough with a change in color or increased sputum production, we are concerned about bacterial bronchitis.  If left untreated it can progress to pneumonia.  If your symptoms do not improve with your treatment plan it is important that you contact your provider.   I have prescribed Azithromyin 250 mg: two tablets now and then one tablet daily for 4 additonal days    In addition you may use A prescription cough medication called Tessalon Perles 100mg . You may take 1-2 capsules every 8 hours as needed for your cough.  I have also sent albuterol.   From your responses in the eVisit questionnaire you describe inflammation in the upper respiratory tract which is causing a significant cough.  This is commonly called Bronchitis and has four common causes:   Allergies Viral Infections Acid Reflux Bacterial Infection Allergies, viruses and acid reflux are treated by controlling symptoms or eliminating the cause. An example might be a cough caused by taking certain blood pressure medications. You stop the cough by changing the medication. Another example might be a cough caused by acid reflux. Controlling the reflux helps control the cough.  USE OF BRONCHODILATOR ("RESCUE") INHALERS: There is a risk from using your bronchodilator too frequently.  The risk is that over-reliance on a medication which only relaxes the muscles surrounding the breathing tubes can reduce the effectiveness of medications prescribed to reduce swelling and congestion of the tubes themselves.  Although you feel brief relief from the bronchodilator inhaler, your asthma may actually be worsening with the tubes becoming more swollen and filled with mucus.  This can delay other crucial treatments, such as oral steroid medications. If you need  to use a bronchodilator inhaler daily, several times per day, you should discuss this with your provider.  There are probably better treatments that could be used to keep your asthma under control.     HOME CARE Only take medications as instructed by your medical team. Complete the entire course of an antibiotic. Drink plenty of fluids and get plenty of rest. Avoid close contacts especially the very young and the elderly Cover your mouth if you cough or cough into your sleeve. Always remember to wash your hands A steam or ultrasonic humidifier can help congestion.   GET HELP RIGHT AWAY IF: You develop worsening fever. You become short of breath You cough up blood. Your symptoms persist after you have completed your treatment plan MAKE SURE YOU  Understand these instructions. Will watch your condition. Will get help right away if you are not doing well or get worse.    Thank you for choosing an e-visit.  Your e-visit answers were reviewed by a board certified advanced clinical practitioner to complete your personal care plan. Depending upon the condition, your plan could have included both over the counter or prescription medications.  Please review your pharmacy choice. Make sure the pharmacy is open so you can pick up prescription now. If there is a problem, you may contact your provider through [employer] and have the prescription routed to another pharmacy.  Your safety is important to [place]. If you have drug allergies check your prescription carefully.   For the next 24 hours you can use MyChart to ask questions about today's visit, request  a non-urgent call back, or ask for a work or school excuse. You will get an email in the next two days asking about your experience. I hope that your e-visit has been valuable and will speed your recovery.

## 2023-02-19 ENCOUNTER — Ambulatory Visit (INDEPENDENT_AMBULATORY_CARE_PROVIDER_SITE_OTHER)
Admission: RE | Admit: 2023-02-19 | Discharge: 2023-02-19 | Disposition: A | Discharge status: Home / Self Care | Payer: Commercial Managed Care - PPO | Source: Ambulatory Visit | Attending: Family Medicine | Admitting: Family Medicine

## 2023-02-19 ENCOUNTER — Encounter: Payer: Self-pay | Admitting: Family Medicine

## 2023-02-19 ENCOUNTER — Other Ambulatory Visit (HOSPITAL_BASED_OUTPATIENT_CLINIC_OR_DEPARTMENT_OTHER): Payer: Self-pay

## 2023-02-19 ENCOUNTER — Other Ambulatory Visit: Payer: Self-pay | Admitting: Family Medicine

## 2023-02-19 ENCOUNTER — Telehealth: Payer: Self-pay | Admitting: Family Medicine

## 2023-02-19 ENCOUNTER — Ambulatory Visit: Payer: Commercial Managed Care - PPO | Admitting: Family Medicine

## 2023-02-19 VITALS — BP 108/62 | HR 105 | Temp 99.1°F | Ht 64.0 in | Wt 132.0 lb

## 2023-02-19 DIAGNOSIS — R051 Acute cough: Secondary | ICD-10-CM

## 2023-02-19 DIAGNOSIS — R059 Cough, unspecified: Secondary | ICD-10-CM

## 2023-02-19 LAB — POCT INFLUENZA A/B
Influenza A, POC: NEGATIVE
Influenza B, POC: NEGATIVE

## 2023-02-19 LAB — POC COVID19 BINAXNOW: SARS Coronavirus 2 Ag: NEGATIVE

## 2023-02-19 MED ORDER — PREDNISONE 20 MG PO TABS
40.0000 mg | ORAL_TABLET | Freq: Every day | ORAL | 0 refills | Status: AC
  Filled 2023-02-19: qty 10, 5d supply, fill #0

## 2023-02-19 NOTE — Progress Notes (Signed)
Chief Complaint  Patient presents with   Cough    Day 8 of being sick Shortness of breath Would like a Chest xray.     [name] here for URI complaints.  Duration: 8 days  Associated symptoms: sinus congestion, rhinorrhea, wheezing, shortness of breath, and loss of voice, coughing Denies: sinus pain, itchy watery eyes, ear pain, ear drainage, sore throat, myalgia, and fevers Treatment to date: Tessalon Perles, Zpak, Mucinex, SABA, robitussin Sick contacts: Yes; daughter  Past Medical History:  Diagnosis Date   Adjustment reaction with anxiety and depression 06/12/2014   Anemia    h/o low ferritin   Anxiety    Asthma    environmental triggers   Chicken pox as a child   Cough 08/22/2015   Depression    Depression with anxiety 06/12/2014   Diverticulosis    Ehrlichiosis 123456   Gallstones    GERD (gastroesophageal reflux disease)    Hair loss 03/28/2017   Hiatal hernia 2007   History of viral illness    Hyperlipidemia, mixed 04/11/2015   Insomnia 08/21/2017   Lactose intolerance 06/12/2014   Mumps as a child   Overweight 08/19/2014   Rectocele 06/12/2014   RLS (restless legs syndrome)    SCC (squamous cell carcinoma) 06/12/2014   Right arm removed 2011   Sessile colonic polyp    SVT (supraventricular tachycardia)    a. ablation in 05/2006   Tachycardia 06/12/2014   Thyroid disease    Vitamin D deficiency 08/22/2015    Objective BP 108/62 (BP Location: Left Arm, Patient Position: Sitting, Cuff Size: Normal)   Pulse (!) 105   Temp 99.1 F (37.3 C) (Oral)   Ht 5\' 4"  (1.626 m)   Wt 132 lb (59.9 kg)   LMP  (LMP Unknown)   SpO2 98%   BMI 22.66 kg/m  General: Awake, alert, appears stated age HEENT: AT, [place], ears patent b/l and TM's neg, nares patent w/o discharge, pharynx pink and without exudates, MMM Neck: No masses or asymmetry Heart: RRR Lungs: CTAB, no accessory muscle use Psych: Age appropriate judgment and insight, normal mood and  affect  Cough, unspecified type - Plan: POCT Influenza A/B, POC COVID-19, DG Chest 2 View  5 d pred burst given reports of wheezing and hx of asthma. CXR ordered. Continue to push fluids, practice good hand hygiene, cover mouth when coughing. F/u prn. If starting to experience fevers, shaking, or shortness of breath, seek immediate care. Pt voiced understanding and agreement to the plan.  [place], DO 02/19/23 11:12 AM

## 2023-02-19 NOTE — Telephone Encounter (Signed)
Pt called stating that she has a terrible cough and was wondering if she could have orders put in for a chest xray. Pt stated she works at [employer] and would like to have it checked. Pt would like a call back with available info.

## 2023-02-19 NOTE — Patient Instructions (Signed)
We will be in touch regarding your X-ray results.  Continue to push fluids, practice good hand hygiene, and cover your mouth if you cough.  If you start having fevers, shaking or shortness of breath, seek immediate care.  Let us know if you need anything.  Please get your X-ray done in the basement of our [place] office located on: [address], [place] [address]  You do not need an appointment for that location.

## 2023-02-19 NOTE — Telephone Encounter (Signed)
Pt has appt with Dr.Wendling

## 2023-02-22 ENCOUNTER — Other Ambulatory Visit (HOSPITAL_COMMUNITY): Payer: Self-pay

## 2023-02-22 ENCOUNTER — Other Ambulatory Visit: Payer: Self-pay

## 2023-02-22 ENCOUNTER — Encounter: Payer: Self-pay | Admitting: Family Medicine

## 2023-02-22 ENCOUNTER — Other Ambulatory Visit: Payer: Self-pay | Admitting: Family Medicine

## 2023-02-22 MED ORDER — AMOXICILLIN-POT CLAVULANATE 875-125 MG PO TABS
1.0000 | ORAL_TABLET | Freq: Two times a day (BID) | ORAL | 0 refills | Status: DC
  Filled 2023-02-22: qty 14, 7d supply, fill #0

## 2023-02-23 ENCOUNTER — Other Ambulatory Visit: Payer: Self-pay | Admitting: Family Medicine

## 2023-02-23 MED ORDER — AMOXICILLIN-POT CLAVULANATE 875-125 MG PO TABS: 1.0000 | ORAL_TABLET | Freq: Two times a day (BID) | ORAL | 0 refills | Status: AC

## 2023-02-23 NOTE — Progress Notes (Signed)
I have provided 5 minutes of non face to face time during this encounter for chart review and documentation.   

## 2023-02-26 ENCOUNTER — Other Ambulatory Visit: Payer: Self-pay

## 2023-02-26 ENCOUNTER — Other Ambulatory Visit (HOSPITAL_COMMUNITY): Payer: Self-pay

## 2023-02-26 ENCOUNTER — Encounter (HOSPITAL_COMMUNITY): Payer: Self-pay

## 2023-03-05 ENCOUNTER — Other Ambulatory Visit: Payer: Self-pay

## 2023-03-08 ENCOUNTER — Other Ambulatory Visit (HOSPITAL_COMMUNITY): Payer: Self-pay

## 2023-03-22 ENCOUNTER — Other Ambulatory Visit (HOSPITAL_COMMUNITY): Payer: Self-pay

## 2023-03-22 ENCOUNTER — Other Ambulatory Visit: Payer: Self-pay

## 2023-03-23 ENCOUNTER — Other Ambulatory Visit (HOSPITAL_COMMUNITY): Payer: Self-pay

## 2023-03-23 ENCOUNTER — Other Ambulatory Visit: Payer: Self-pay

## 2023-03-26 ENCOUNTER — Other Ambulatory Visit (HOSPITAL_COMMUNITY): Payer: Self-pay

## 2023-04-03 ENCOUNTER — Other Ambulatory Visit: Payer: Self-pay | Admitting: Family Medicine

## 2023-04-04 ENCOUNTER — Other Ambulatory Visit: Payer: Self-pay

## 2023-04-04 MED ORDER — TEMAZEPAM 30 MG PO CAPS
30.0000 mg | ORAL_CAPSULE | Freq: Every day | ORAL | 5 refills | Status: DC
  Filled 2023-04-04: qty 30, 30d supply, fill #0
  Filled 2023-05-04: qty 30, 30d supply, fill #1
  Filled 2023-06-06: qty 30, 30d supply, fill #2

## 2023-04-05 ENCOUNTER — Other Ambulatory Visit: Payer: Self-pay

## 2023-04-21 ENCOUNTER — Other Ambulatory Visit: Payer: Self-pay | Admitting: Family Medicine

## 2023-04-23 ENCOUNTER — Other Ambulatory Visit (HOSPITAL_COMMUNITY): Payer: Self-pay

## 2023-04-23 ENCOUNTER — Other Ambulatory Visit: Payer: Self-pay

## 2023-04-23 MED ORDER — TRAZODONE HCL 100 MG PO TABS
200.0000 mg | ORAL_TABLET | Freq: Every day | ORAL | 1 refills | Status: DC
  Filled 2023-04-23: qty 180, 90d supply, fill #0
  Filled 2023-07-17: qty 180, 90d supply, fill #1

## 2023-04-28 ENCOUNTER — Other Ambulatory Visit (HOSPITAL_COMMUNITY): Payer: Self-pay

## 2023-05-02 ENCOUNTER — Other Ambulatory Visit (HOSPITAL_COMMUNITY): Payer: Self-pay

## 2023-05-04 ENCOUNTER — Other Ambulatory Visit (HOSPITAL_COMMUNITY): Payer: Self-pay

## 2023-05-04 ENCOUNTER — Other Ambulatory Visit: Payer: Self-pay

## 2023-05-08 ENCOUNTER — Other Ambulatory Visit (HOSPITAL_COMMUNITY): Payer: Self-pay

## 2023-05-16 DIAGNOSIS — L820 Inflamed seborrheic keratosis: Secondary | ICD-10-CM | POA: Diagnosis not present

## 2023-05-16 DIAGNOSIS — L814 Other melanin hyperpigmentation: Secondary | ICD-10-CM | POA: Diagnosis not present

## 2023-05-16 DIAGNOSIS — D225 Melanocytic nevi of trunk: Secondary | ICD-10-CM | POA: Diagnosis not present

## 2023-05-16 DIAGNOSIS — D485 Neoplasm of uncertain behavior of skin: Secondary | ICD-10-CM | POA: Diagnosis not present

## 2023-05-16 DIAGNOSIS — D224 Melanocytic nevi of scalp and neck: Secondary | ICD-10-CM | POA: Diagnosis not present

## 2023-05-16 DIAGNOSIS — L538 Other specified erythematous conditions: Secondary | ICD-10-CM | POA: Diagnosis not present

## 2023-05-16 DIAGNOSIS — L821 Other seborrheic keratosis: Secondary | ICD-10-CM | POA: Diagnosis not present

## 2023-05-16 DIAGNOSIS — L700 Acne vulgaris: Secondary | ICD-10-CM | POA: Diagnosis not present

## 2023-05-18 ENCOUNTER — Other Ambulatory Visit (HOSPITAL_COMMUNITY): Payer: Self-pay

## 2023-05-18 MED ORDER — TRETINOIN 0.1 % EX CREA
TOPICAL_CREAM | CUTANEOUS | 2 refills | Status: DC
  Filled 2023-05-18 (×2): qty 45, 30d supply, fill #0

## 2023-05-18 MED ORDER — SPIRONOLACTONE 25 MG PO TABS
25.0000 mg | ORAL_TABLET | ORAL | 5 refills | Status: DC
  Filled 2023-05-18 (×2): qty 60, 30d supply, fill #0

## 2023-05-18 MED ORDER — CLINDAMYCIN PHOSPHATE 1 % EX SOLN
CUTANEOUS | 2 refills | Status: DC
  Filled 2023-05-18 (×2): qty 60, 30d supply, fill #0
  Filled 2023-07-11: qty 60, 30d supply, fill #1
  Filled 2023-09-28: qty 60, 30d supply, fill #2

## 2023-05-19 ENCOUNTER — Other Ambulatory Visit: Payer: Self-pay | Admitting: Family Medicine

## 2023-05-21 ENCOUNTER — Other Ambulatory Visit: Payer: Self-pay

## 2023-05-21 ENCOUNTER — Other Ambulatory Visit (HOSPITAL_COMMUNITY): Payer: Self-pay

## 2023-05-21 MED ORDER — VITAMIN D3 125 MCG (5000 UT) PO CAPS
5000.0000 [IU] | ORAL_CAPSULE | Freq: Every day | ORAL | 1 refills | Status: AC
  Filled 2023-05-21: qty 90, 90d supply, fill #0

## 2023-05-23 DIAGNOSIS — D485 Neoplasm of uncertain behavior of skin: Secondary | ICD-10-CM | POA: Diagnosis not present

## 2023-05-25 ENCOUNTER — Other Ambulatory Visit: Payer: Self-pay

## 2023-05-27 NOTE — Assessment & Plan Note (Signed)
No recent exacerbation 

## 2023-05-27 NOTE — Assessment & Plan Note (Signed)
Supplement and monitor 

## 2023-05-27 NOTE — Assessment & Plan Note (Addendum)
Tolerating statin, encouraged heart healthy diet, avoid trans fats, minimize simple carbs and saturated fats. Increase exercise as tolerated. Has only been taking her statin twice a week to see if it helps her pain but it has not helped

## 2023-05-27 NOTE — Assessment & Plan Note (Signed)
hgba1c acceptable, minimize simple carbs. Increase exercise as tolerated. Mounjaro 

## 2023-05-27 NOTE — Assessment & Plan Note (Signed)
Encouraged good sleep hygiene such as dark, quiet room. No blue/green glowing lights such as computer screens in bedroom. No alcohol or stimulants in evening. Cut down on caffeine as able. Regular exercise is helpful but not just prior to bed time.  Can continue current meds prn

## 2023-05-27 NOTE — Progress Notes (Unsigned)
Subjective:    Patient ID: [name] Jensen, female    DOB: [birth year], 67 y.o.   MRN: [id number]  No chief complaint on file.   HPI Discussed the use of AI scribe software for clinical note transcription with the patient, who gave verbal consent to proceed.  History of Present Illness  Patient is a 67 year old female in for follow up on chronic medical concerns. No recent febrile illness or hospitalizations. Denies CP/palp/SOB/HA/congestion/fevers/GI or GU c/o. Taking meds as prescribed           Past Medical History:  Diagnosis Date  . Adjustment reaction with anxiety and depression 06/12/2014  . Anemia    h/o low ferritin  . Anxiety   . Asthma    environmental triggers  . Chicken pox as a child  . Cough 08/22/2015  . Depression   . Depression with anxiety 06/12/2014  . Diverticulosis   . Ehrlichiosis 2014  . Gallstones   . GERD (gastroesophageal reflux disease)   . Hair loss 03/28/2017  . Hiatal hernia 2007  . History of viral illness   . Hyperlipidemia, mixed 04/11/2015  . Insomnia 08/21/2017  . Lactose intolerance 06/12/2014  . Mumps as a child  . Overweight 08/19/2014  . Rectocele 06/12/2014  . RLS (restless legs syndrome)   . 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 Sessile colonic polyp   . SVT (supraventricular tachycardia)    a. ablation in 05/2006  . Tachycardia 06/12/2014  . Thyroid disease   . Vitamin D deficiency 08/22/2015    Past Surgical History:  Procedure Laterality Date  . ABDOMINAL HYSTERECTOMY  11-2008   total with cystocele, rectocele repair  . APPENDECTOMY    . CHOLECYSTECTOMY    . COLONOSCOPY  2006   diverticulosis, hemorrhoids ([place])  . ESOPHAGOGASTRODUODENOSCOPY  2006   small hiatus hernia ([place])  . ESOPHAGOGASTRODUODENOSCOPY N/A 02/21/2018   Procedure: ESOPHAGOGASTRODUODENOSCOPY (EGD);  Surgeon: Malissa Hippo, MD;  Location: [facility];  Service: Endoscopy;  Laterality: N/A;  pt knows to  arrive at 2:45  . INCONTINENCE SURGERY    . SKIN SURGERY     right arm scc  . svt ablation    . TONSILLECTOMY  1972   2ith adenoid  . WISDOM TOOTH EXTRACTION  67 yrs old    Family History  Problem Relation Age of Onset  . Hypertension Mother   . Hypertension Father   . Hypertension Sister   . Diabetes Sister        type 2  . Fibromyalgia Sister   . Multiple sclerosis Sister   . Proteinuria Sister   . GI Bleed Maternal Grandmother   . Atrial fibrillation Maternal Grandmother   . Heart disease Maternal Grandfather   . Diabetes Paternal Grandmother   . Stroke Paternal Grandfather   . Colon cancer Neg Hx   . Esophageal cancer Neg Hx   . Rectal cancer Neg Hx   . Stomach cancer Neg Hx     Social History   Socioeconomic History  . Marital status: Divorced    Spouse name: Not on file  . Number of children: 2  . Years of education: Not on file  . Highest education level: Not on file  Occupational History  . Occupation: Teacher, adult education: [employer]  Tobacco Use  . Smoking status: Never  . Smokeless tobacco: Never  Vaping Use  . Vaping Use: Never used  Substance and Sexual  Activity  . Alcohol use: No    Alcohol/week: 0.0 standard drinks of alcohol  . Drug use: No  . Sexual activity: Not Currently    Partners: Male    Comment: lives by self with 4 dogs works at [employer], avoids dairy  Other Topics Concern  . Not on file  Social History Narrative  . Not on file   Social Determinants of Health   Financial Resource Strain: Patient Declined (05/21/2023)   Overall Financial Resource Strain (CARDIA)   . Difficulty of Paying Living Expenses: Patient declined  Food Insecurity: Patient Declined (05/21/2023)   Hunger Vital Sign   . Worried About Programme researcher, broadcasting/film/video in the Last Year: Patient declined   . Ran Out of Food in the Last Year: Patient declined  Transportation Needs: Patient Declined (05/21/2023)   PRAPARE - Transportation   . Lack of Transportation (Medical):  Patient declined   . Lack of Transportation (Non-Medical): Patient declined  Physical Activity: Sufficiently Active (05/21/2023)   Exercise Vital Sign   . Days of Exercise per Week: 5 days   . Minutes of Exercise per Session: 30 min  Stress: Patient Declined (05/21/2023)   [employer] of Occupational Health - Occupational Stress Questionnaire   . Feeling of Stress : Patient declined  Social Connections: Unknown (05/21/2023)   Social Connection and Isolation Panel [NHANES]   . Frequency of Communication with Friends and Family: Patient declined   . Frequency of Social Gatherings with Friends and Family: Patient declined   . Attends Religious Services: Patient declined   . Active Member of Clubs or Organizations: Patient declined   . Attends Banker Meetings: Not on file   . Marital Status: Patient declined  Intimate Partner Violence: Not on file    Outpatient Medications Prior to Visit  Medication Sig Dispense Refill  . albuterol (VENTOLIN HFA) 108 (90 Base) MCG/ACT inhaler Inhale 2 puffs into the lungs every 6 (six) hours as needed for wheezing or shortness of breath. 8 g 0  . atorvastatin (LIPITOR) 10 MG tablet Take 1 tablet (10 mg total) by mouth daily. 90 tablet 1  . benzonatate (TESSALON) 200 MG capsule Take 1 capsule (200 mg total) by mouth 2 (two) times daily as needed for cough. 20 capsule 0  . bisoprolol (ZEBETA) 5 MG tablet Take 1/2 tablet (2.5 mg total) by mouth daily.  Replaces Metoprolol. 45 tablet 3  . blood glucose meter kit and supplies KIT Dispense based on patient and insurance preference. Use up to four times daily as directed. (FOR ICD-9 250.00, 250.01). 1 each 0  . Cholecalciferol (VITAMIN D3) 125 MCG (5000 UT) CAPS Take 1 capsule (5,000 Units total) by mouth daily. 90 capsule 1  . clindamycin (CLEOCIN T) 1 % external solution Apply to face in the morning 60 mL 2  . conjugated estrogens (PREMARIN) vaginal cream Apply small amount to vaginal mucosa  twice daily 42.5 g 5  . esomeprazole (NEXIUM) 40 MG capsule Take 1 capsule (40 mg total) by mouth daily. 60 capsule 11  . eszopiclone (LUNESTA) 2 MG TABS tablet Take 1 tablet (2 mg total) by mouth at bedtime as needed for sleep. 30 tablet 1  . Eszopiclone 3 MG TABS Take 1 tablet (3 mg total) by mouth at bedtime. Take immediately before bedtime 30 tablet 2  . linaclotide (LINZESS) 145 MCG CAPS capsule Take 1 capsule by mouth daily before breakfast. 30 capsule 3  . spironolactone (ALDACTONE) 25 MG tablet Take 1 tablet  by mouth once daily x2 weeks, then increase to twice daily 60 tablet 5  . temazepam (RESTORIL) 30 MG capsule Take 1 capsule (30 mg total) by mouth at bedtime. 30 capsule 5  . tirzepatide (MOUNJARO) 7.5 MG/0.5ML Pen Inject 7.5 mg into the skin once a week. 6 mL 3  . traZODone (DESYREL) 100 MG tablet Take 2 tablets (200 mg total) by mouth at bedtime. 180 tablet 1  . tretinoin (RETIN-A) 0.1 % cream Apply to face every night at bedtime as tolerated 45 g 2  . tretinoin (RETIN-A) 0.05 % cream Apply to the face at bedtime 45 g 3   No facility-administered medications prior to visit.    Allergies  Allergen Reactions  . Dilaudid [Hydromorphone Hcl] Other (See Comments)    Respiratory , cardiac     Review of Systems  Constitutional:  Negative for fever and malaise/fatigue.  HENT:  Negative for congestion.   Eyes:  Negative for blurred vision.  Respiratory:  Negative for shortness of breath.   Cardiovascular:  Negative for chest pain, palpitations and leg swelling.  Gastrointestinal:  Negative for abdominal pain, blood in stool and nausea.  Genitourinary:  Negative for dysuria and frequency.  Musculoskeletal:  Negative for falls.  Skin:  Negative for rash.  Neurological:  Negative for dizziness, loss of consciousness and headaches.  Endo/Heme/Allergies:  Negative for environmental allergies.  Psychiatric/Behavioral:  Negative for depression. The patient is not nervous/anxious.        Objective:    Physical Exam Constitutional:      General: She is not in acute distress.    Appearance: Normal appearance. She is well-developed. She is not toxic-appearing.  HENT:     Head: Normocephalic and atraumatic.     Right Ear: External ear normal.     Left Ear: External ear normal.     Nose: Nose normal.  Eyes:     General:        Right eye: No discharge.        Left eye: No discharge.     Conjunctiva/sclera: Conjunctivae normal.  Neck:     Thyroid: No thyromegaly.  Cardiovascular:     Rate and Rhythm: Normal rate and regular rhythm.     Heart sounds: Normal heart sounds. No murmur heard. Pulmonary:     Effort: Pulmonary effort is normal. No respiratory distress.     Breath sounds: Normal breath sounds.  Abdominal:     General: Bowel sounds are normal.     Palpations: Abdomen is soft.     Tenderness: There is no abdominal tenderness. There is no guarding.  Musculoskeletal:        General: Normal range of motion.     Cervical back: Neck supple.  Lymphadenopathy:     Cervical: No cervical adenopathy.  Skin:    General: Skin is warm and dry.  Neurological:     Mental Status: She is alert and oriented to person, place, and time.  Psychiatric:        Mood and Affect: Mood normal.        Behavior: Behavior normal.        Thought Content: Thought content normal.        Judgment: Judgment normal.   LMP  (LMP Unknown)  Wt Readings from Last 3 Encounters:  02/19/23 132 lb (59.9 kg)  01/30/23 129 lb (58.5 kg)  01/29/23 132 lb 3.2 oz (60 kg)    Diabetic Foot Exam - Simple   No data filed  Lab Results  Component Value Date   WBC 6.6 01/29/2023   HGB 12.9 01/29/2023   HCT 37.9 01/29/2023   PLT 311.0 01/29/2023   GLUCOSE 88 01/29/2023   CHOL 147 01/29/2023   TRIG 117.0 01/29/2023   HDL 51.40 01/29/2023   LDLDIRECT 72.0 04/26/2020   LDLCALC 72 01/29/2023   ALT 21 01/29/2023   AST 18 01/29/2023   NA 139 01/29/2023   K 4.0 01/29/2023   CL 104  01/29/2023   CREATININE 0.68 01/29/2023   BUN 11 01/29/2023   CO2 27 01/29/2023   TSH 1.36 01/29/2023   INR 1.0 11/09/2008   HGBA1C 5.4 01/29/2023   MICROALBUR 3.8 02/24/2022    Lab Results  Component Value Date   TSH 1.36 01/29/2023   Lab Results  Component Value Date   WBC 6.6 01/29/2023   HGB 12.9 01/29/2023   HCT 37.9 01/29/2023   MCV 83.9 01/29/2023   PLT 311.0 01/29/2023   Lab Results  Component Value Date   NA 139 01/29/2023   K 4.0 01/29/2023   CO2 27 01/29/2023   GLUCOSE 88 01/29/2023   BUN 11 01/29/2023   CREATININE 0.68 01/29/2023   BILITOT 0.4 01/29/2023   ALKPHOS 60 01/29/2023   AST 18 01/29/2023   ALT 21 01/29/2023   PROT 6.8 01/29/2023   ALBUMIN 4.1 01/29/2023   CALCIUM 9.5 01/29/2023   ANIONGAP 7 08/17/2021   GFR 91.98 01/29/2023   Lab Results  Component Value Date   CHOL 147 01/29/2023   Lab Results  Component Value Date   HDL 51.40 01/29/2023   Lab Results  Component Value Date   LDLCALC 72 01/29/2023   Lab Results  Component Value Date   TRIG 117.0 01/29/2023   Lab Results  Component Value Date   CHOLHDL 3 01/29/2023   Lab Results  Component Value Date   HGBA1C 5.4 01/29/2023       Assessment & Plan:  Hyperlipidemia, mixed Assessment & Plan: Tolerating statin, encouraged heart healthy diet, avoid trans fats, minimize simple carbs and saturated fats. Increase exercise as tolerated   Hyperglycemia Assessment & Plan: hgba1c acceptable, minimize simple carbs. Increase exercise as tolerated. Mounjaro   Vitamin D deficiency Assessment & Plan: Supplement and monitor    Uncomplicated asthma, unspecified asthma severity, unspecified whether persistent Assessment & Plan: No recent exacerbation   Insomnia, unspecified type Assessment & Plan: Encouraged good sleep hygiene such as dark, quiet room. No blue/green glowing lights such as computer screens in bedroom. No alcohol or stimulants in evening. Cut down on caffeine as  able. Regular exercise is helpful but not just prior to bed time.  Can continue current meds prn     Assessment and Plan              Danise Edge, MD

## 2023-05-28 ENCOUNTER — Other Ambulatory Visit (HOSPITAL_COMMUNITY): Payer: Self-pay

## 2023-05-28 ENCOUNTER — Ambulatory Visit: Payer: Commercial Managed Care - PPO | Admitting: Family Medicine

## 2023-05-28 ENCOUNTER — Encounter: Payer: Self-pay | Admitting: Physician Assistant

## 2023-05-28 ENCOUNTER — Other Ambulatory Visit: Payer: Self-pay

## 2023-05-28 ENCOUNTER — Ambulatory Visit: Payer: Commercial Managed Care - PPO | Admitting: Physician Assistant

## 2023-05-28 ENCOUNTER — Encounter: Payer: Self-pay | Admitting: Family Medicine

## 2023-05-28 VITALS — BP 90/58 | HR 120 | Ht 64.0 in

## 2023-05-28 VITALS — BP 105/64 | HR 88 | Temp 97.8°F | Resp 16 | Ht 64.0 in | Wt 131.0 lb

## 2023-05-28 DIAGNOSIS — E559 Vitamin D deficiency, unspecified: Secondary | ICD-10-CM

## 2023-05-28 DIAGNOSIS — M1990 Unspecified osteoarthritis, unspecified site: Secondary | ICD-10-CM | POA: Diagnosis not present

## 2023-05-28 DIAGNOSIS — Z8719 Personal history of other diseases of the digestive system: Secondary | ICD-10-CM

## 2023-05-28 DIAGNOSIS — E782 Mixed hyperlipidemia: Secondary | ICD-10-CM | POA: Diagnosis not present

## 2023-05-28 DIAGNOSIS — R739 Hyperglycemia, unspecified: Secondary | ICD-10-CM

## 2023-05-28 DIAGNOSIS — M25569 Pain in unspecified knee: Secondary | ICD-10-CM

## 2023-05-28 DIAGNOSIS — K5900 Constipation, unspecified: Secondary | ICD-10-CM | POA: Diagnosis not present

## 2023-05-28 DIAGNOSIS — J45909 Unspecified asthma, uncomplicated: Secondary | ICD-10-CM

## 2023-05-28 DIAGNOSIS — G8929 Other chronic pain: Secondary | ICD-10-CM

## 2023-05-28 DIAGNOSIS — K5904 Chronic idiopathic constipation: Secondary | ICD-10-CM

## 2023-05-28 DIAGNOSIS — G4700 Insomnia, unspecified: Secondary | ICD-10-CM | POA: Diagnosis not present

## 2023-05-28 DIAGNOSIS — K2100 Gastro-esophageal reflux disease with esophagitis, without bleeding: Secondary | ICD-10-CM | POA: Diagnosis not present

## 2023-05-28 DIAGNOSIS — M79645 Pain in left finger(s): Secondary | ICD-10-CM | POA: Diagnosis not present

## 2023-05-28 DIAGNOSIS — W57XXXS Bitten or stung by nonvenomous insect and other nonvenomous arthropods, sequela: Secondary | ICD-10-CM

## 2023-05-28 LAB — LIPID PANEL
Cholesterol: 158 mg/dL (ref 0–200)
HDL: 58.1 mg/dL (ref >39.00)
LDL Cholesterol: 77 mg/dL (ref 0–99)
NonHDL: 100.01
Total CHOL/HDL Ratio: 3
Triglycerides: 114 mg/dL (ref 0.0–149.0)
VLDL: 22.8 mg/dL (ref 0.0–40.0)

## 2023-05-28 LAB — COMPREHENSIVE METABOLIC PANEL
ALT: 18 U/L (ref 0–35)
AST: 16 U/L (ref 0–37)
Albumin: 4.4 g/dL (ref 3.5–5.2)
Alkaline Phosphatase: 58 U/L (ref 39–117)
BUN: 14 mg/dL (ref 6–23)
CO2: 25 mEq/L (ref 19–32)
Calcium: 9.8 mg/dL (ref 8.4–10.5)
Chloride: 104 mEq/L (ref 96–112)
Creatinine, Ser: 0.81 mg/dL (ref 0.40–1.20)
GFR: 76.49 mL/min (ref >60.00)
Glucose, Bld: 97 mg/dL (ref 70–99)
Potassium: 4.4 mEq/L (ref 3.5–5.1)
Sodium: 136 mEq/L (ref 135–145)
Total Bilirubin: 0.7 mg/dL (ref 0.2–1.2)
Total Protein: 7.1 g/dL (ref 6.0–8.3)

## 2023-05-28 LAB — CBC WITH DIFFERENTIAL/PLATELET
Basophils Absolute: 0 10*3/uL (ref 0.0–0.1)
Basophils Relative: 0.6 % (ref 0.0–3.0)
Eosinophils Absolute: 0 10*3/uL (ref 0.0–0.7)
Eosinophils Relative: 0.8 % (ref 0.0–5.0)
HCT: 37.4 % (ref 36.0–46.0)
Hemoglobin: 12.5 g/dL (ref 12.0–15.0)
Lymphocytes Relative: 36.6 % (ref 12.0–46.0)
Lymphs Abs: 2.3 10*3/uL (ref 0.7–4.0)
MCHC: 33.5 g/dL (ref 30.0–36.0)
MCV: 83.8 fl (ref 78.0–100.0)
Monocytes Absolute: 0.3 10*3/uL (ref 0.1–1.0)
Monocytes Relative: 4.8 % (ref 3.0–12.0)
Neutro Abs: 3.6 10*3/uL (ref 1.4–7.7)
Neutrophils Relative %: 57.2 % (ref 43.0–77.0)
Platelets: 298 10*3/uL (ref 150.0–400.0)
RBC: 4.46 Mil/uL (ref 3.87–5.11)
RDW: 13.3 % (ref 11.5–15.5)
WBC: 6.3 10*3/uL (ref 4.0–10.5)

## 2023-05-28 LAB — VITAMIN D 25 HYDROXY (VIT D DEFICIENCY, FRACTURES): VITD: 74.5 ng/mL (ref 30.00–100.00)

## 2023-05-28 LAB — SEDIMENTATION RATE: Sed Rate: 16 mm/hr (ref 0–30)

## 2023-05-28 LAB — URIC ACID: Uric Acid, Serum: 3.5 mg/dL (ref 2.4–7.0)

## 2023-05-28 LAB — HIGH SENSITIVITY CRP: CRP, High Sensitivity: 0.49 mg/L (ref 0.000–5.000)

## 2023-05-28 LAB — TSH: TSH: 1.63 u[IU]/mL (ref 0.35–5.50)

## 2023-05-28 LAB — T4, FREE: Free T4: 1.11 ng/dL (ref 0.60–1.60)

## 2023-05-28 LAB — HEMOGLOBIN A1C: Hgb A1c MFr Bld: 5.4 % (ref 4.6–6.5)

## 2023-05-28 MED ORDER — TIRZEPATIDE 5 MG/0.5ML ~~LOC~~ SOAJ
5.0000 mg | SUBCUTANEOUS | 3 refills | Status: DC
  Filled 2023-05-28: qty 6, 84d supply, fill #0
  Filled 2023-06-01 – 2023-06-18 (×2): qty 2, 28d supply, fill #0
  Filled 2023-07-11: qty 2, 28d supply, fill #1
  Filled 2023-08-05 – 2023-08-07 (×2): qty 2, 28d supply, fill #2
  Filled 2023-09-04: qty 2, 28d supply, fill #3
  Filled 2023-09-28: qty 2, 28d supply, fill #4
  Filled 2023-10-26: qty 2, 28d supply, fill #5
  Filled 2023-11-22: qty 2, 28d supply, fill #6
  Filled 2023-12-19 – 2023-12-20 (×3): qty 2, 28d supply, fill #7
  Filled 2024-01-16 – 2024-01-21 (×2): qty 2, 28d supply, fill #8
  Filled 2024-02-14: qty 2, 28d supply, fill #9
  Filled 2024-03-13: qty 2, 28d supply, fill #10
  Filled 2024-04-10: qty 2, 28d supply, fill #11

## 2023-05-28 NOTE — Assessment & Plan Note (Signed)
Pain is overwhelming

## 2023-05-28 NOTE — Patient Instructions (Signed)
Arthritis Arthritis is a term that is commonly used to refer to joint pain or joint disease. There are more than 100 types of arthritis. What are the causes? The most common cause of this condition is wear and tear of a joint. Other causes include: Gout. Inflammation of a joint. An infection of a joint. Sprains and other injuries near the joint. A reaction to medicines or drugs, or an allergic reaction. In some cases, the cause may not be known. What are the signs or symptoms? The main symptom of this condition is pain in the joint during movement. Other symptoms include: Redness, swelling, or stiffness at a joint. Warmth coming from the joint. Fever. Overall feeling of illness. How is this diagnosed? This condition may be diagnosed with a physical exam and tests, including: Blood tests. Urine tests. Imaging tests, such as X-rays, an MRI, or a CT scan. Sometimes, fluid is removed from a joint for testing. How is this treated? This condition may be treated with: Treatment of the cause, if it is known. Rest. Raising (elevating) the joint. Applying cold or hot packs to the joint. Medicines to improve symptoms and reduce inflammation. Injections of a steroid, such as cortisone, into the joint to help reduce pain and inflammation. Depending on the cause of your arthritis, you may need to make lifestyle changes to reduce stress on your joint. Changes may include: Exercising more. Losing weight. Follow these instructions at home: Medicines Take over-the-counter and prescription medicines only as told by your health care provider. Do not take aspirin to relieve pain if your health care provider thinks that gout may be causing your pain. Activity Rest your joint if told by your health care provider. Rest is important when your disease is active and your joint feels painful, swollen, or stiff. Avoid activities that make the pain worse. Balance activity with rest. Exercise your joint  regularly with range-of-motion exercises as told by your health care provider. Try doing low-impact exercise, such as: Swimming. Water aerobics. Biking. Walking. Managing pain, stiffness, and swelling     If directed, put ice on the affected joint. To do this: Put ice in a plastic bag. Place a towel between your skin and the bag. Leave the ice on for 20 minutes, 2-3 times a day. Remove the ice if your skin turns bright red. This is very important. If you cannot feel pain, heat, or cold, you have a greater risk of damage to the area. If your joint is swollen, raise (elevate) it above the level of your heart if directed by your health care provider. If your joint feels stiff in the morning, try taking a warm shower. If directed, apply heat to the affected area as often as told by your health care provider. Use the heat source that your health care provider recommends, such as a moist heat pack or a heating pad. To apply heat: Place a towel between your skin and the heat source. Leave the heat on for 20-30 minutes. Remove the heat if your skin turns bright red. This is especially important if you are unable to feel pain, heat, or cold. You have a greater risk of getting burned. General instructions Maintain a healthy weight. Follow instructions from your health care provider for weight control. Do not use any products that contain nicotine or tobacco. These products include cigarettes, chewing tobacco, and vaping devices, such as e-cigarettes. If you need help quitting, ask your health care provider. Keep all follow-up visits. This is important. Where   to find more information National Institutes of Health: www.niams.nih.gov Contact a health care provider if: The pain gets worse. You have a fever. Get help right away if: You develop severe joint pain, swelling, or redness. Many joints become painful and swollen. You develop severe back pain. You develop severe weakness in your  leg. Summary Arthritis is a term that is commonly used to refer to joint pain or joint disease. There are more than 100 types of arthritis. The most common cause of this condition is wear and tear of a joint. Other causes include gout, inflammation or infection of the joint, sprains, or allergies. Symptoms of this condition include redness, swelling, or stiffness of the joint. Other symptoms include warmth, fever, or feeling ill. This condition is treated with rest, elevation, medicines, and applying cold or hot packs. Follow your health care provider's instructions about medicines, activity, exercises, and other home care treatments. This information is not intended to replace advice given to you by your health care provider. Make sure you discuss any questions you have with your health care provider. Document Revised: 08/30/2021 Document Reviewed: 08/30/2021 Elsevier Patient Education  2024 [employer]  

## 2023-05-28 NOTE — Progress Notes (Signed)
Chief Complaint: Follow up GERD and constipation  HPI:    [name] Jensen is a  67 y/o female with a past medical history as listed below including decreased ejection fraction of 45% (echo 01/12/2023), constipation, diverticulitis and reflux, known to Dr. Russella Dar, who presents to clinic today for follow-up of her reflux and constipation.     02/19/2019 colonoscopy with one 5 mm polyp in the transverse colon, one 4 mm polyp in the rectum and moderate diverticulosis in the left colon as well as external hemorrhoids.  Repeat recommended in 7 years.    05/30/2022 patient seen in clinic by Dr. Russella Dar and at that time following up for constipation and diverticulitis.  Her Linzess had been increased to 145 mcg daily which led to diarrhea so she took it intermittently.  She had been treated for suspected diverticulitis with Cipro and Flagyl and her symptoms have resolved within a few days.  At that time told to take Linzess 72 mcg daily and not as needed and MiraLAX daily as needed.  Discussed possible Trulance if not effective.  Also told to follow a gastroparesis diet and antireflux measures continued on Nexium 40 mg daily.  Discussed her surveillance colonoscopy is recommended in March or 2027 due to history of polyps.    Of note patient saw PCP this morning and has many labs pending in regards to joint pain.    Today, patient presents to clinic and tells me that she is doing well.  In fact she has only had 1 episode of reflux that she can remember in the past 6 months for which she used Tums over-the-counter.  Describes that she went on [facility] and had some significant weight loss and feels like this has helped all of her reflux symptoms.  This is really not a problem for her anymore.    Does discuss continued constipation.  Tells me that about once a week she will need to take Linzess and she either uses a 72 mcg or the 145.  Tells me for the most part as long as she stays hydrated and eats fiber she does okay.   Does discuss that she is going to go on vitamin D soon given her osteopenia and may start taking Linzess 72 on a regular basis to avoid constipation.    Denies fever, chills, weight loss or blood in her stool.  Past Medical History:  Diagnosis Date   Adjustment reaction with anxiety and depression 06/12/2014   Anemia    h/o low ferritin   Anxiety    Asthma    environmental triggers   Chicken pox as a child   Cough 08/22/2015   Depression    Depression with anxiety 06/12/2014   Diverticulosis    Ehrlichiosis 2014   Gallstones    GERD (gastroesophageal reflux disease)    Hair loss 03/28/2017   Hiatal hernia 2007   History of viral illness    Hyperlipidemia, mixed 04/11/2015   Insomnia 08/21/2017   Lactose intolerance 06/12/2014   Mumps as a child   Overweight 08/19/2014   Rectocele 06/12/2014   RLS (restless legs syndrome)    SCC (squamous cell carcinoma) 06/12/2014   Right arm removed 2011   Sessile colonic polyp    SVT (supraventricular tachycardia)    a. ablation in 05/2006   Tachycardia 06/12/2014   Thyroid disease    Vitamin D deficiency 08/22/2015    Past Surgical History:  Procedure Laterality Date   ABDOMINAL HYSTERECTOMY  11-2008  total with cystocele, rectocele repair   APPENDECTOMY     CHOLECYSTECTOMY     COLONOSCOPY  2006   diverticulosis, hemorrhoids ([place])   ESOPHAGOGASTRODUODENOSCOPY  2006   small hiatus hernia ([place])   ESOPHAGOGASTRODUODENOSCOPY N/A 02/21/2018   Procedure: ESOPHAGOGASTRODUODENOSCOPY (EGD);  Surgeon: Malissa Hippo, MD;  Location: [facility];  Service: Endoscopy;  Laterality: N/A;  pt knows to arrive at 2:45   INCONTINENCE SURGERY     SKIN SURGERY     right arm scc   svt ablation     TONSILLECTOMY  1972   2ith adenoid   WISDOM TOOTH EXTRACTION  67 yrs old    Current Outpatient Medications  Medication Sig Dispense Refill   atorvastatin (LIPITOR) 10 MG tablet Take 1 tablet (10 mg total) by mouth daily. 90 tablet 1    bisoprolol (ZEBETA) 5 MG tablet Take 1/2 tablet (2.5 mg total) by mouth daily.  Replaces Metoprolol. 45 tablet 3   blood glucose meter kit and supplies KIT Dispense based on patient and insurance preference. Use up to four times daily as directed. (FOR ICD-9 250.00, 250.01). 1 each 0   Cholecalciferol (VITAMIN D3) 125 MCG (5000 UT) CAPS Take 1 capsule (5,000 Units total) by mouth daily. 90 capsule 1   clindamycin (CLEOCIN T) 1 % external solution Apply to face in the morning 60 mL 2   conjugated estrogens (PREMARIN) vaginal cream Apply small amount to vaginal mucosa twice daily 42.5 g 5   Eszopiclone 3 MG TABS Take 1 tablet (3 mg total) by mouth at bedtime. Take immediately before bedtime 30 tablet 2   linaclotide (LINZESS) 145 MCG CAPS capsule Take 1 capsule by mouth daily before breakfast. 30 capsule 3   spironolactone (ALDACTONE) 25 MG tablet Take 1 tablet by mouth once daily x2 weeks, then increase to twice daily 60 tablet 5   temazepam (RESTORIL) 30 MG capsule Take 1 capsule (30 mg total) by mouth at bedtime. 30 capsule 5   tirzepatide (MOUNJARO) 5 MG/0.5ML Pen Inject 5 mg into the skin once a week. 6 mL 3   traZODone (DESYREL) 100 MG tablet Take 2 tablets (200 mg total) by mouth at bedtime. 180 tablet 1   No current facility-administered medications for this visit.    Allergies as of 05/28/2023 - Review Complete 05/28/2023  Allergen Reaction Noted   Dilaudid [hydromorphone hcl] Other (See Comments) 09/23/2011   Prednisone Palpitations 05/28/2023    Family History  Problem Relation Age of Onset   Hypertension Mother    Hypertension Father    Hypertension Sister    Diabetes Sister        type 2   Fibromyalgia Sister    Multiple sclerosis Sister    Proteinuria Sister    GI Bleed Maternal Grandmother    Atrial fibrillation Maternal Grandmother    Heart disease Maternal Grandfather    Diabetes Paternal Grandmother    Stroke Paternal Grandfather    Colon cancer Neg Hx     Esophageal cancer Neg Hx    Rectal cancer Neg Hx    Stomach cancer Neg Hx     Social History   Socioeconomic History   Marital status: Divorced    Spouse name: Not on file   Number of children: 2   Years of education: Not on file   Highest education level: Not on file  Occupational History   Occupation: Teacher, adult education: [employer]  Tobacco Use   Smoking status: Never  Smokeless tobacco: Never  Vaping Use   Vaping Use: Never used  Substance and Sexual Activity   Alcohol use: No    Alcohol/week: 0.0 standard drinks of alcohol   Drug use: No   Sexual activity: Not Currently    Partners: Male    Comment: lives by self with 4 dogs works at [employer], avoids dairy  Other Topics Concern   Not on file  Social History Narrative   Not on file   Social Determinants of Health   Financial Resource Strain: Patient Declined (05/21/2023)   Overall Financial Resource Strain (CARDIA)    Difficulty of Paying Living Expenses: Patient declined  Food Insecurity: Patient Declined (05/21/2023)   Hunger Vital Sign    Worried About Running Out of Food in the Last Year: Patient declined    Ran Out of Food in the Last Year: Patient declined  Transportation Needs: Patient Declined (05/21/2023)   PRAPARE - Administrator, Civil Service (Medical): Patient declined    Lack of Transportation (Non-Medical): Patient declined  Physical Activity: Sufficiently Active (05/21/2023)   Exercise Vital Sign    Days of Exercise per Week: 5 days    Minutes of Exercise per Session: 30 min  Stress: Patient Declined (05/21/2023)   [employer] of Occupational Health - Occupational Stress Questionnaire    Feeling of Stress : Patient declined  Social Connections: Unknown (05/21/2023)   Social Connection and Isolation Panel [NHANES]    Frequency of Communication with Friends and Family: Patient declined    Frequency of Social Gatherings with Friends and Family: Patient declined    Attends Religious  Services: Patient declined    Database administrator or Organizations: Patient declined    Attends Engineer, structural: Not on file    Marital Status: Patient declined  Catering manager Violence: Not on file    Review of Systems:    Constitutional: No weight loss, fever or chills Cardiovascular: No chest pain Respiratory: No SOB  Gastrointestinal: See HPI and otherwise negative   Physical Exam:  Vital signs: BP (!) 90/58 (BP Location: Left Arm, Patient Position: Sitting, Cuff Size: Normal)   Pulse (!) 120   Ht 5\' 4"  (1.626 m)   LMP  (LMP Unknown)   BMI 22.49 kg/m    Constitutional:   Pleasant Caucasian female appears to be in NAD, Well developed, Well nourished, alert and cooperative Respiratory: Respirations even and unlabored. Lungs clear to auscultation bilaterally.   No wheezes, crackles, or rhonchi.  Cardiovascular: Normal S1, S2. No MRG. Regular rate and rhythm. No peripheral edema, cyanosis or pallor.  Gastrointestinal:  Soft, nondistended, nontender. No rebound or guarding. Normal bowel sounds. No appreciable masses or hepatomegaly. Rectal:  Not performed.  Psychiatric: Oriented to person, place and time. Demonstrates good judgement and reason without abnormal affect or behaviors.  RELEVANT LABS AND IMAGING: CBC    Component Value Date/Time   WBC 6.6 01/29/2023 1228   RBC 4.52 01/29/2023 1228   HGB 12.9 01/29/2023 1228   HCT 37.9 01/29/2023 1228   PLT 311.0 01/29/2023 1228   MCV 83.9 01/29/2023 1228   MCH 28.4 02/24/2022 1230   MCHC 34.0 01/29/2023 1228   RDW 12.9 01/29/2023 1228   LYMPHSABS 2.9 01/29/2023 1228   MONOABS 0.2 01/29/2023 1228   EOSABS 0.1 01/29/2023 1228   BASOSABS 0.0 01/29/2023 1228    CMP     Component Value Date/Time   NA 139 01/29/2023 1228   K 4.0 01/29/2023 1228  CL 104 01/29/2023 1228   CO2 27 01/29/2023 1228   GLUCOSE 88 01/29/2023 1228   BUN 11 01/29/2023 1228   CREATININE 0.68 01/29/2023 1228   CREATININE 0.82  02/24/2022 1230   CALCIUM 9.5 01/29/2023 1228   PROT 6.8 01/29/2023 1228   ALBUMIN 4.1 01/29/2023 1228   AST 18 01/29/2023 1228   ALT 21 01/29/2023 1228   ALKPHOS 60 01/29/2023 1228   BILITOT 0.4 01/29/2023 1228   GFRNONAA >60 08/17/2021 0842   GFRAA >60 01/29/2019 0823    Assessment: 1.  Chronic constipation: Diet and lifestyle modifications help with the frequency of this and Linzess 2.  GERD: Only 1 episode over the past 6 months related to diet and resolved with Tums, much better since significant weight loss 3.  History of diverticulitis: About 2 times a year, typically preceded by constipation and resolves with Cipro/Flagyl  Plan: 1.  Patient is doing very well today.  Would recommend she continue as needed over-the-counter reflux products. 2.  Discussed constipation.  If she is going to start vitamin D which can be constipating we discussed using the Linzess 72 mcg daily.  If she needs more of this medication she will call and let us know. 3.  Patient to follow in clinic with [place] in a year or sooner if needed.  Hyacinth Meeker, PA-C [facility] 05/28/2023, 10:55 AM  Cc: Bradd Canary, MD

## 2023-05-28 NOTE — Patient Instructions (Addendum)
Continue Linzess 72 mcg as needed.   Follow up in 1 year or sooner if needed.  _______________________________________________________  If your blood pressure at your visit was 140/90 or greater, please contact your primary care physician to follow up on this.  _______________________________________________________  If you are age 65 or older, your body mass index should be between 23-30. Your Body mass index is 22.49 kg/m. If this is out of the aforementioned range listed, please consider follow up with your Primary Care Provider.  If you are age 71 or younger, your body mass index should be between 19-25. Your Body mass index is 22.49 kg/m. If this is out of the aformentioned range listed, please consider follow up with your Primary Care Provider.   ________________________________________________________  The [place] [facility] providers would like to encourage you to use [facility] to communicate with providers for non-urgent requests or questions.  Due to long hold times on the telephone, sending your provider a message by [facility] may be a faster and more efficient way to get a response.  Please allow 48 business hours for a response.  Please remember that this is for non-urgent requests.  _______________________________________________________

## 2023-05-28 NOTE — Assessment & Plan Note (Signed)
She has been doing well but this past week she was off work and not drinking water as much. She had been holding off on Linzess but restarted it a couple days ago again and is seeing GI today in follow up

## 2023-06-01 ENCOUNTER — Other Ambulatory Visit (HOSPITAL_COMMUNITY): Payer: Self-pay

## 2023-06-01 LAB — B. BURGDORFI ANTIBODIES: B burgdorferi Ab IgG+IgM: <0.9 index

## 2023-06-01 LAB — ROCKY MTN SPOTTED FVR ABS PNL(IGG+IGM)
RMSF IgG: NOT DETECTED
RMSF IgM: NOT DETECTED

## 2023-06-02 LAB — EHRLICHIA ANTIBODY PANEL
E. CHAFFEENSIS AB IGG: <1:64 {titer}
E. CHAFFEENSIS AB IGM: <1:20 {titer}

## 2023-06-02 LAB — INSULIN, RANDOM: Insulin: 20.4 u[IU]/mL — ABNORMAL HIGH

## 2023-06-02 LAB — ANA: Anti Nuclear Antibody (ANA): NEGATIVE

## 2023-06-02 LAB — RHEUMATOID FACTOR: Rheumatoid fact SerPl-aCnc: <10 IU/mL (ref <14)

## 2023-06-08 ENCOUNTER — Other Ambulatory Visit (HOSPITAL_COMMUNITY): Payer: Self-pay

## 2023-06-08 ENCOUNTER — Other Ambulatory Visit: Payer: Self-pay

## 2023-06-09 ENCOUNTER — Other Ambulatory Visit: Payer: Self-pay | Admitting: Family Medicine

## 2023-06-09 DIAGNOSIS — E782 Mixed hyperlipidemia: Secondary | ICD-10-CM

## 2023-06-11 ENCOUNTER — Other Ambulatory Visit (HOSPITAL_COMMUNITY): Payer: Self-pay

## 2023-06-11 MED ORDER — ATORVASTATIN CALCIUM 10 MG PO TABS
10.0000 mg | ORAL_TABLET | Freq: Every day | ORAL | 1 refills | Status: DC
Start: 2023-06-11 — End: 2023-12-20
  Filled 2023-06-11: qty 90, 90d supply, fill #0
  Filled 2023-07-11 – 2023-09-28 (×2): qty 90, 90d supply, fill #1

## 2023-06-12 ENCOUNTER — Other Ambulatory Visit (HOSPITAL_COMMUNITY): Payer: Self-pay

## 2023-06-13 ENCOUNTER — Other Ambulatory Visit (HOSPITAL_COMMUNITY): Payer: Self-pay

## 2023-06-14 ENCOUNTER — Other Ambulatory Visit: Payer: Self-pay | Admitting: Oncology

## 2023-06-14 DIAGNOSIS — Z006 Encounter for examination for normal comparison and control in clinical research program: Secondary | ICD-10-CM

## 2023-06-18 ENCOUNTER — Other Ambulatory Visit (HOSPITAL_COMMUNITY): Payer: Self-pay

## 2023-06-18 ENCOUNTER — Other Ambulatory Visit: Payer: Self-pay

## 2023-07-11 ENCOUNTER — Other Ambulatory Visit: Payer: Self-pay

## 2023-07-13 ENCOUNTER — Other Ambulatory Visit (HOSPITAL_COMMUNITY): Payer: Self-pay

## 2023-07-17 ENCOUNTER — Other Ambulatory Visit (HOSPITAL_COMMUNITY): Payer: Self-pay

## 2023-07-18 ENCOUNTER — Encounter: Payer: Self-pay | Admitting: Family Medicine

## 2023-07-18 ENCOUNTER — Ambulatory Visit (INDEPENDENT_AMBULATORY_CARE_PROVIDER_SITE_OTHER): Payer: Commercial Managed Care - PPO | Admitting: Family Medicine

## 2023-07-18 VITALS — BP 98/60 | HR 88 | Ht 64.0 in | Wt 131.0 lb

## 2023-07-18 DIAGNOSIS — Z0000 Encounter for general adult medical examination without abnormal findings: Secondary | ICD-10-CM

## 2023-07-18 NOTE — Progress Notes (Signed)
Complete physical exam  Patient: [name] Jensen   DOB: 1956-02-05   68 y.o. Female  MRN: [id number]  Subjective:    Chief Complaint  Patient presents with   Annual Exam    [name] Jensen is a 67 y.o. female who presents today for a complete physical exam. She reports consuming a general, low carb, minimal red meat diet. Home exercise routine includes walking dog. She generally feels well. She reports sleeping well. She does not have additional problems to discuss today.   Currently lives with: alone Acute concerns or interim problems since last visit: no  Vision concerns: no concerns Dental concerns: no concerns    Patient denies ETOH use. Patient denies nicotine use. Patient denies illegal substance use.      Most recent fall risk assessment:    07/18/2023    1:31 PM  Fall Risk   Falls in the past year? 0  Number falls in past yr: 0  Injury with Fall? 0  Risk for fall due to : No Fall Risks     Most recent depression screenings:    07/18/2023    1:31 PM 05/28/2023    8:57 AM  PHQ 2/9 Scores  PHQ - 2 Score 0 0  PHQ- 9 Score  0            Patient Care Team: Bradd Canary, MD as PCP - General (Family Medicine)   Outpatient Medications Prior to Visit  Medication Sig   atorvastatin (LIPITOR) 10 MG tablet Take 1 tablet (10 mg total) by mouth daily.   bisoprolol (ZEBETA) 5 MG tablet Take 1/2 tablet (2.5 mg total) by mouth daily.  Replaces Metoprolol.   blood glucose meter kit and supplies KIT Dispense based on patient and insurance preference. Use up to four times daily as directed. (FOR ICD-9 250.00, 250.01).   Cholecalciferol (VITAMIN D3) 125 MCG (5000 UT) CAPS Take 1 capsule (5,000 Units total) by mouth daily.   clindamycin (CLEOCIN T) 1 % external solution Apply to face in the morning   conjugated estrogens (PREMARIN) vaginal cream Apply small amount to vaginal mucosa twice daily   Eszopiclone 3 MG TABS Take 1 tablet (3 mg total) by mouth at  bedtime. Take immediately before bedtime   tirzepatide (MOUNJARO) 5 MG/0.5ML Pen Inject 5 mg into the skin once a week.   traZODone (DESYREL) 100 MG tablet Take 2 tablets (200 mg total) by mouth at bedtime.   [DISCONTINUED] spironolactone (ALDACTONE) 25 MG tablet Take 1 tablet by mouth once daily x2 weeks, then increase to twice daily   [DISCONTINUED] temazepam (RESTORIL) 30 MG capsule Take 1 capsule (30 mg total) by mouth at bedtime.   No facility-administered medications prior to visit.    ROS All review of systems negative except what is listed in the HPI        Objective:     BP 98/60   Pulse 88   Ht 5\' 4"  (1.626 m)   Wt 131 lb (59.4 kg)   LMP  (LMP Unknown)   SpO2 98%   BMI 22.49 kg/m    Physical Exam Vitals reviewed.  Constitutional:      General: She is not in acute distress.    Appearance: Normal appearance. She is not ill-appearing.  HENT:     Head: Normocephalic and atraumatic.     Right Ear: Tympanic membrane normal.     Left Ear: Tympanic membrane normal.     Nose: Nose normal.  Mouth/Throat:     Mouth: Mucous membranes are moist.     Pharynx: Oropharynx is clear.  Eyes:     Extraocular Movements: Extraocular movements intact.     Conjunctiva/sclera: Conjunctivae normal.     Pupils: Pupils are equal, round, and reactive to light.  Neck:     Vascular: No carotid bruit.  Cardiovascular:     Rate and Rhythm: Normal rate and regular rhythm.     Pulses: Normal pulses.     Heart sounds: Normal heart sounds.  Pulmonary:     Effort: Pulmonary effort is normal.     Breath sounds: Normal breath sounds.  Abdominal:     General: Abdomen is flat. Bowel sounds are normal. There is no distension.     Palpations: Abdomen is soft. There is no mass.     Tenderness: There is no abdominal tenderness. There is no right CVA tenderness, left CVA tenderness, guarding or rebound.  Genitourinary:    Comments: Deferred exam Musculoskeletal:        General: Normal  range of motion.     Cervical back: Normal range of motion and neck supple. No tenderness.     Right lower leg: No edema.     Left lower leg: No edema.  Lymphadenopathy:     Cervical: No cervical adenopathy.  Skin:    General: Skin is warm and dry.     Capillary Refill: Capillary refill takes less than 2 seconds.  Neurological:     General: No focal deficit present.     Mental Status: She is alert and oriented to person, place, and time. Mental status is at baseline.  Psychiatric:        Mood and Affect: Mood normal.        Behavior: Behavior normal.        Thought Content: Thought content normal.        Judgment: Judgment normal.         No results found for any visits on 07/18/23.     Assessment & Plan:    Routine Health Maintenance and Physical Exam Discussed health promotion and safety including diet and exercise recommendations, dental health, and injury prevention. Tobacco cessation if applicable. Seat belts, sunscreen, smoke detectors, etc.    Immunization History  Administered Date(s) Administered   Influenza Split 10/04/2013   Influenza,inj,Quad PF,6+ Mos 08/21/2017   Influenza,inj,Quad PF,6-35 Mos 09/21/2022   Influenza-Unspecified 09/14/2014, 09/08/2015, 08/25/2021   Moderna Covid-19 Vaccine Bivalent Booster 78yrs & up 08/19/2021   Moderna Sars-Covid-2 Vaccination 11/30/2019, 12/29/2019, 09/28/2020, 03/12/2021   Tdap 04/03/2009, 09/14/2020   Zoster Recombinant(Shingrix) 04/26/2020, 09/14/2020    Health Maintenance  Topic Date Due   Diabetic kidney evaluation - Urine ACR  Never done   INFLUENZA VACCINE  07/05/2023   COVID-19 Vaccine (6 - 2023-24 season) 08/03/2023 (Originally 08/04/2022)   HEMOGLOBIN A1C  11/27/2023   OPHTHALMOLOGY EXAM  02/07/2024   Diabetic kidney evaluation - eGFR measurement  05/27/2024   MAMMOGRAM  12/06/2024   Colonoscopy  02/18/2026   DTaP/Tdap/Td (3 - Td or Tdap) 09/14/2030   Hepatitis C Screening  Completed   HIV Screening   Completed   Zoster Vaccines- Shingrix  Completed   HPV VACCINES  Aged Out   FOOT EXAM  Discontinued   PAP SMEAR-Modifier  Discontinued        Problem List Items Addressed This Visit   None Visit Diagnoses     Annual physical exam    -  Primary Labs stable with  PCP visit about 6 weeks and will be following up with her in another 6 weeks. No need to repeat any labs today.  Health promotion and safety discussed.       Return in about 1 year (around 07/17/2024) for physical.     Clayborne Dana, NP

## 2023-08-05 ENCOUNTER — Other Ambulatory Visit (HOSPITAL_COMMUNITY): Payer: Self-pay

## 2023-08-07 ENCOUNTER — Other Ambulatory Visit: Payer: Self-pay

## 2023-08-07 ENCOUNTER — Encounter: Payer: Self-pay | Admitting: Pharmacist

## 2023-08-07 ENCOUNTER — Other Ambulatory Visit (HOSPITAL_COMMUNITY): Payer: Self-pay

## 2023-08-16 ENCOUNTER — Other Ambulatory Visit: Payer: Self-pay

## 2023-08-16 DIAGNOSIS — I351 Nonrheumatic aortic (valve) insufficiency: Secondary | ICD-10-CM

## 2023-09-03 ENCOUNTER — Ambulatory Visit: Payer: Commercial Managed Care - PPO | Admitting: Family Medicine

## 2023-09-03 NOTE — Assessment & Plan Note (Addendum)
Tolerating statin, encouraged heart healthy diet, avoid trans fats, minimize simple carbs and saturated fats. Increase exercise as tolerated 

## 2023-09-03 NOTE — Assessment & Plan Note (Signed)
RRR 

## 2023-09-03 NOTE — Assessment & Plan Note (Signed)
Supplement and monitor 

## 2023-09-03 NOTE — Assessment & Plan Note (Signed)
hgba1c acceptable, minimize simple carbs. Increase exercise as tolerated. Mounjaro 

## 2023-09-03 NOTE — Assessment & Plan Note (Signed)
Avoid offending foods, start probiotics. Do not eat large meals in late evening and consider raising head of bed.  

## 2023-09-04 ENCOUNTER — Ambulatory Visit: Payer: Commercial Managed Care - PPO | Admitting: Family Medicine

## 2023-09-04 ENCOUNTER — Other Ambulatory Visit: Payer: Self-pay

## 2023-09-04 ENCOUNTER — Encounter: Payer: Self-pay | Admitting: Family Medicine

## 2023-09-04 ENCOUNTER — Other Ambulatory Visit (HOSPITAL_COMMUNITY): Payer: Self-pay

## 2023-09-04 VITALS — BP 110/68 | HR 104 | Temp 98.0°F | Resp 16 | Ht 64.0 in | Wt 131.2 lb

## 2023-09-04 DIAGNOSIS — R000 Tachycardia, unspecified: Secondary | ICD-10-CM

## 2023-09-04 DIAGNOSIS — R739 Hyperglycemia, unspecified: Secondary | ICD-10-CM | POA: Diagnosis not present

## 2023-09-04 DIAGNOSIS — E559 Vitamin D deficiency, unspecified: Secondary | ICD-10-CM | POA: Diagnosis not present

## 2023-09-04 DIAGNOSIS — E88819 Insulin resistance, unspecified: Secondary | ICD-10-CM

## 2023-09-04 DIAGNOSIS — E782 Mixed hyperlipidemia: Secondary | ICD-10-CM

## 2023-09-04 DIAGNOSIS — M791 Myalgia, unspecified site: Secondary | ICD-10-CM

## 2023-09-04 DIAGNOSIS — K219 Gastro-esophageal reflux disease without esophagitis: Secondary | ICD-10-CM | POA: Diagnosis not present

## 2023-09-04 LAB — LIPID PANEL
Cholesterol: 190 mg/dL (ref 0–200)
HDL: 60.5 mg/dL (ref >39.00)
LDL Cholesterol: 107 mg/dL — ABNORMAL HIGH (ref 0–99)
NonHDL: 129.89
Total CHOL/HDL Ratio: 3
Triglycerides: 116 mg/dL (ref 0.0–149.0)
VLDL: 23.2 mg/dL (ref 0.0–40.0)

## 2023-09-04 LAB — CBC WITH DIFFERENTIAL/PLATELET
Basophils Absolute: 0 10*3/uL (ref 0.0–0.1)
Basophils Relative: 0.4 % (ref 0.0–3.0)
Eosinophils Absolute: 0 10*3/uL (ref 0.0–0.7)
Eosinophils Relative: 0.4 % (ref 0.0–5.0)
HCT: 40.8 % (ref 36.0–46.0)
Hemoglobin: 13.6 g/dL (ref 12.0–15.0)
Lymphocytes Relative: 36.3 % (ref 12.0–46.0)
Lymphs Abs: 2.4 10*3/uL (ref 0.7–4.0)
MCHC: 33.4 g/dL (ref 30.0–36.0)
MCV: 84.4 fL (ref 78.0–100.0)
Monocytes Absolute: 0.2 10*3/uL (ref 0.1–1.0)
Monocytes Relative: 3.7 % (ref 3.0–12.0)
Neutro Abs: 3.8 10*3/uL (ref 1.4–7.7)
Neutrophils Relative %: 59.2 % (ref 43.0–77.0)
Platelets: 318 10*3/uL (ref 150.0–400.0)
RBC: 4.84 Mil/uL (ref 3.87–5.11)
RDW: 13.3 % (ref 11.5–15.5)
WBC: 6.5 10*3/uL (ref 4.0–10.5)

## 2023-09-04 LAB — TSH: TSH: 1.26 u[IU]/mL (ref 0.35–5.50)

## 2023-09-04 LAB — COMPREHENSIVE METABOLIC PANEL
ALT: 22 U/L (ref 0–35)
AST: 17 U/L (ref 0–37)
Albumin: 4.3 g/dL (ref 3.5–5.2)
Alkaline Phosphatase: 64 U/L (ref 39–117)
BUN: 20 mg/dL (ref 6–23)
CO2: 25 meq/L (ref 19–32)
Calcium: 9.9 mg/dL (ref 8.4–10.5)
Chloride: 102 meq/L (ref 96–112)
Creatinine, Ser: 0.84 mg/dL (ref 0.40–1.20)
GFR: 73.09 mL/min (ref >60.00)
Glucose, Bld: 94 mg/dL (ref 70–99)
Potassium: 4.4 meq/L (ref 3.5–5.1)
Sodium: 137 meq/L (ref 135–145)
Total Bilirubin: 0.7 mg/dL (ref 0.2–1.2)
Total Protein: 6.9 g/dL (ref 6.0–8.3)

## 2023-09-04 LAB — SEDIMENTATION RATE: Sed Rate: 11 mm/h (ref 0–30)

## 2023-09-04 LAB — VITAMIN D 25 HYDROXY (VIT D DEFICIENCY, FRACTURES): VITD: 72.41 ng/mL (ref 30.00–100.00)

## 2023-09-04 LAB — HIGH SENSITIVITY CRP: CRP, High Sensitivity: 0.37 mg/L (ref 0.000–5.000)

## 2023-09-04 LAB — HEMOGLOBIN A1C: Hgb A1c MFr Bld: 5.4 % (ref 4.6–6.5)

## 2023-09-04 LAB — MAGNESIUM: Magnesium: 1.9 mg/dL (ref 1.5–2.5)

## 2023-09-04 MED ORDER — ESZOPICLONE 3 MG PO TABS
3.0000 mg | ORAL_TABLET | Freq: Every day | ORAL | 2 refills | Status: DC
  Filled 2023-09-04: qty 30, 30d supply, fill #0
  Filled 2023-10-03 – 2023-10-08 (×3): qty 30, 30d supply, fill #1
  Filled 2023-11-15: qty 30, 30d supply, fill #2

## 2023-09-04 NOTE — Assessment & Plan Note (Signed)
Check insulin

## 2023-09-04 NOTE — Progress Notes (Signed)
Subjective:    Patient ID: [name] Jensen, female    DOB: 03-19-1956, 67 y.o.   MRN: [id number]  Chief Complaint  Patient presents with  . Follow-up    Follow up    HPI Discussed the use of AI scribe software for clinical note transcription with the patient, who gave verbal consent to proceed.  History of Present Illness   The patient, with a history of metabolic syndrome and diverticulitis, presents with concerns about muscle and bone pain, sleep disturbances, and a desire to reduce medication intake. The patient reports a significant decrease in muscle mass and strength, describing a feeling of having "no muscle." The patient also reports deep, aching pain in the biceps and femurs, with occasional knee pain. The patient has been on Trazodone for a long period and is considering reducing the dosage or discontinuing it due to concerns about potential side effects, including muscle and bone pain.  The patient also reports sleep disturbances, including waking up at 3-4 am and difficulty returning to sleep. The patient has previously been on Ativan and Temazepam but has discontinued these medications without significant withdrawal symptoms. The patient is considering resuming Lunesta to help with sleep disturbances.  The patient also expresses a desire to reduce overall medication intake, with a goal of retiring from current medications. The patient has reduced Trazodone dosage from 200mg  to 100mg  and is considering further reduction to 50mg .  The patient also discusses lifestyle changes, including exercise and diet modifications. The patient acknowledges a lack of regular exercise and expresses a desire to incorporate weight lifting into daily routines. The patient also discusses dietary concerns, including cravings for carbohydrates and concerns about potential insulin resistance.        Past Medical History:  Diagnosis Date  . Adjustment reaction with anxiety and depression  06/12/2014  . Anemia    h/o low ferritin  . Anxiety   . Asthma    environmental triggers  . Chicken pox as a child  . Cough 08/22/2015  . Depression   . Depression with anxiety 06/12/2014  . Diverticulosis   . Ehrlichiosis 2014  . Gallstones   . GERD (gastroesophageal reflux disease)   . Hair loss 03/28/2017  . Hiatal hernia 2007  . History of viral illness   . Hyperlipidemia, mixed 04/11/2015  . Insomnia 08/21/2017  . Lactose intolerance 06/12/2014  . Mumps as a child  . Overweight 08/19/2014  . Rectocele 06/12/2014  . RLS (restless legs syndrome)   . 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 Sessile colonic polyp   . SVT (supraventricular tachycardia)    a. ablation in 05/2006  . Tachycardia 06/12/2014  . Thyroid disease   . Vitamin D deficiency 08/22/2015    Past Surgical History:  Procedure Laterality Date  . ABDOMINAL HYSTERECTOMY  11-2008   total with cystocele, rectocele repair  . APPENDECTOMY    . CHOLECYSTECTOMY    . COLONOSCOPY  2006   diverticulosis, hemorrhoids ([facility])  . ESOPHAGOGASTRODUODENOSCOPY  2006   small hiatus hernia ([facility])  . ESOPHAGOGASTRODUODENOSCOPY N/A 02/21/2018   Procedure: ESOPHAGOGASTRODUODENOSCOPY (EGD);  Surgeon: Malissa Hippo, MD;  Location: [facility];  Service: Endoscopy;  Laterality: N/A;  pt knows to arrive at 2:45  . INCONTINENCE SURGERY    . SKIN SURGERY     right arm scc  . svt ablation    . TONSILLECTOMY  1972   2ith adenoid  . WISDOM TOOTH EXTRACTION  67  yrs old    Family History  Problem Relation Age of Onset  . Hypertension Mother   . Hypertension Father   . Hypertension Sister   . Diabetes Sister        type 2  . Fibromyalgia Sister   . Multiple sclerosis Sister   . Proteinuria Sister   . GI Bleed Maternal Grandmother   . Atrial fibrillation Maternal Grandmother   . Heart disease Maternal Grandfather   . Diabetes Paternal Grandmother   . Stroke Paternal Grandfather   .  Colon cancer Neg Hx   . Esophageal cancer Neg Hx   . Rectal cancer Neg Hx   . Stomach cancer Neg Hx     Social History   Socioeconomic History  . Marital status: Divorced    Spouse name: Not on file  . Number of children: 2  . Years of education: Not on file  . Highest education level: Not on file  Occupational History  . Occupation: Teacher, adult education: [employer]  Tobacco Use  . Smoking status: Never  . Smokeless tobacco: Never  Vaping Use  . Vaping status: Never Used  Substance and Sexual Activity  . Alcohol use: No    Alcohol/week: 0.0 standard drinks of alcohol  . Drug use: No  . Sexual activity: Not Currently    Partners: Male    Comment: lives by self with 4 dogs works at [employer], avoids dairy  Other Topics Concern  . Not on file  Social History Narrative  . Not on file   Social Determinants of Health   Financial Resource Strain: Patient Declined (05/21/2023)   Overall Financial Resource Strain (CARDIA)   . Difficulty of Paying Living Expenses: Patient declined  Food Insecurity: Patient Declined (05/21/2023)   Hunger Vital Sign   . Worried About Programme researcher, broadcasting/film/video in the Last Year: Patient declined   . Ran Out of Food in the Last Year: Patient declined  Transportation Needs: Patient Declined (05/21/2023)   PRAPARE - Transportation   . Lack of Transportation (Medical): Patient declined   . Lack of Transportation (Non-Medical): Patient declined  Physical Activity: Sufficiently Active (05/21/2023)   Exercise Vital Sign   . Days of Exercise per Week: 5 days   . Minutes of Exercise per Session: 30 min  Stress: Patient Declined (05/21/2023)   [employer] of Occupational Health - Occupational Stress Questionnaire   . Feeling of Stress : Patient declined  Social Connections: Unknown (05/21/2023)   Social Connection and Isolation Panel [NHANES]   . Frequency of Communication with Friends and Family: Patient declined   . Frequency of Social Gatherings with  Friends and Family: Patient declined   . Attends Religious Services: Patient declined   . Active Member of Clubs or Organizations: Patient declined   . Attends Banker Meetings: Not on file   . Marital Status: Patient declined  Intimate Partner Violence: Not on file    Outpatient Medications Prior to Visit  Medication Sig Dispense Refill  . atorvastatin (LIPITOR) 10 MG tablet Take 1 tablet (10 mg total) by mouth daily. 90 tablet 1  . bisoprolol (ZEBETA) 5 MG tablet Take 1/2 tablet (2.5 mg total) by mouth daily.  Replaces Metoprolol. 45 tablet 3  . blood glucose meter kit and supplies KIT Dispense based on patient and insurance preference. Use up to four times daily as directed. (FOR ICD-9 250.00, 250.01). 1 each 0  . Cholecalciferol (VITAMIN D3) 125 MCG (5000 UT) CAPS  Take 1 capsule (5,000 Units total) by mouth daily. 90 capsule 1  . clindamycin (CLEOCIN T) 1 % external solution Apply to face in the morning 60 mL 2  . conjugated estrogens (PREMARIN) vaginal cream Apply small amount to vaginal mucosa twice daily 42.5 g 5  . tirzepatide (MOUNJARO) 5 MG/0.5ML Pen Inject 5 mg into the skin once a week. 6 mL 3  . Eszopiclone 3 MG TABS Take 1 tablet (3 mg total) by mouth at bedtime. Take immediately before bedtime 30 tablet 2  . traZODone (DESYREL) 100 MG tablet Take 2 tablets (200 mg total) by mouth at bedtime. 180 tablet 1   No facility-administered medications prior to visit.    Allergies  Allergen Reactions  . Dilaudid [Hydromorphone Hcl] Other (See Comments)    Respiratory , cardiac   . Prednisone Palpitations    Tachycardia up to 166    Review of Systems  Constitutional:  Positive for malaise/fatigue. Negative for fever.  HENT:  Negative for congestion.   Eyes:  Negative for blurred vision.  Respiratory:  Negative for shortness of breath.   Cardiovascular:  Negative for chest pain, palpitations and leg swelling.  Gastrointestinal:  Negative for abdominal pain, blood  in stool and nausea.  Genitourinary:  Negative for dysuria and frequency.  Musculoskeletal:  Positive for back pain and myalgias. Negative for falls.  Skin:  Negative for rash.  Neurological:  Negative for dizziness, loss of consciousness and headaches.  Endo/Heme/Allergies:  Negative for environmental allergies.  Psychiatric/Behavioral:  Negative for depression. The patient is nervous/anxious.       Objective:    Physical Exam Constitutional:      General: She is not in acute distress.    Appearance: Normal appearance. She is well-developed. She is not toxic-appearing.  HENT:     Head: Normocephalic and atraumatic.     Right Ear: External ear normal.     Left Ear: External ear normal.     Nose: Nose normal.  Eyes:     General:        Right eye: No discharge.        Left eye: No discharge.     Conjunctiva/sclera: Conjunctivae normal.  Neck:     Thyroid: No thyromegaly.  Cardiovascular:     Rate and Rhythm: Normal rate and regular rhythm.     Heart sounds: Normal heart sounds. No murmur heard. Pulmonary:     Effort: Pulmonary effort is normal. No respiratory distress.     Breath sounds: Normal breath sounds.  Abdominal:     General: Bowel sounds are normal.     Palpations: Abdomen is soft.     Tenderness: There is no abdominal tenderness. There is no guarding.  Musculoskeletal:        General: Normal range of motion.     Cervical back: Neck supple.  Lymphadenopathy:     Cervical: No cervical adenopathy.  Skin:    General: Skin is warm and dry.  Neurological:     Mental Status: She is alert and oriented to person, place, and time.  Psychiatric:        Mood and Affect: Mood normal.        Behavior: Behavior normal.        Thought Content: Thought content normal.        Judgment: Judgment normal.   BP 110/68 (BP Location: Left Arm, Patient Position: Sitting, Cuff Size: Normal)   Pulse (!) 104   Temp 98 F (36.7 C) (Oral)  Resp 16   Ht 5\' 4"  (1.626 m)   Wt 131 lb  3.2 oz (59.5 kg)   LMP  (LMP Unknown)   SpO2 100%   BMI 22.52 kg/m  Wt Readings from Last 3 Encounters:  09/04/23 131 lb 3.2 oz (59.5 kg)  07/18/23 131 lb (59.4 kg)  05/28/23 131 lb (59.4 kg)    Diabetic Foot Exam - Simple   No data filed    Lab Results  Component Value Date   WBC 6.5 09/04/2023   HGB 13.6 09/04/2023   HCT 40.8 09/04/2023   PLT 318.0 09/04/2023   GLUCOSE 94 09/04/2023   CHOL 190 09/04/2023   TRIG 116.0 09/04/2023   HDL 60.50 09/04/2023   LDLDIRECT 72.0 04/26/2020   LDLCALC 107 (H) 09/04/2023   ALT 22 09/04/2023   AST 17 09/04/2023   NA 137 09/04/2023   K 4.4 09/04/2023   CL 102 09/04/2023   CREATININE 0.84 09/04/2023   BUN 20 09/04/2023   CO2 25 09/04/2023   TSH 1.26 09/04/2023   INR 1.0 11/09/2008   HGBA1C 5.4 09/04/2023   MICROALBUR 3.8 02/24/2022    Lab Results  Component Value Date   TSH 1.26 09/04/2023   Lab Results  Component Value Date   WBC 6.5 09/04/2023   HGB 13.6 09/04/2023   HCT 40.8 09/04/2023   MCV 84.4 09/04/2023   PLT 318.0 09/04/2023   Lab Results  Component Value Date   NA 137 09/04/2023   K 4.4 09/04/2023   CO2 25 09/04/2023   GLUCOSE 94 09/04/2023   BUN 20 09/04/2023   CREATININE 0.84 09/04/2023   BILITOT 0.7 09/04/2023   ALKPHOS 64 09/04/2023   AST 17 09/04/2023   ALT 22 09/04/2023   PROT 6.9 09/04/2023   ALBUMIN 4.3 09/04/2023   CALCIUM 9.9 09/04/2023   ANIONGAP 7 08/17/2021   GFR 73.09 09/04/2023   Lab Results  Component Value Date   CHOL 190 09/04/2023   Lab Results  Component Value Date   HDL 60.50 09/04/2023   Lab Results  Component Value Date   LDLCALC 107 (H) 09/04/2023   Lab Results  Component Value Date   TRIG 116.0 09/04/2023   Lab Results  Component Value Date   CHOLHDL 3 09/04/2023   Lab Results  Component Value Date   HGBA1C 5.4 09/04/2023       Assessment & Plan:  Vitamin D deficiency Assessment & Plan: Supplement and monitor   Orders: -     VITAMIN D 25 Hydroxy  (Vit-D Deficiency, Fractures)  Tachycardia Assessment & Plan: RRR  Orders: -     CBC with Differential/Platelet -     TSH  Hyperlipidemia, mixed Assessment & Plan: Tolerating statin, encouraged heart healthy diet, avoid trans fats, minimize simple carbs and saturated fats. Increase exercise as tolerated.  Orders: -     Lipid panel -     TSH  Hyperglycemia Assessment & Plan: hgba1c acceptable, minimize simple carbs. Increase exercise as tolerated. Mounjaro  Orders: -     Comprehensive metabolic panel -     Hemoglobin A1c -     TSH -     Insulin, random -     VITAMIN D 25 Hydroxy (Vit-D Deficiency, Fractures)  Gastroesophageal reflux disease, unspecified whether esophagitis present Assessment & Plan: Avoid offending foods, start probiotics. Do not eat large meals in late evening and consider raising head of bed.   Orders: -     CBC with Differential/Platelet  Myalgia -  Magnesium -     Sedimentation rate -     High sensitivity CRP  Insulin resistance Assessment & Plan: Check insulin   Other orders -     Eszopiclone; Take 1 tablet (3 mg total) by mouth at bedtime. Take immediately before bedtime  Dispense: 30 tablet; Refill: 2    Assessment and Plan    Muscle and Bone Pain Patient reports persistent muscle and bone pain, particularly in the biceps and femurs. Patient has been on Trazodone for a long time and is considering discontinuing due to potential side effects. -Reduce Trazodone from 100mg  to 50mg  daily and monitor for changes in pain. -Order labs including magnesium, sed rate, CRP to assess for potential inflammatory causes. -Consider Rheumatoid factor if joint pain persists.  Insomnia Patient has history of insomnia, previously managed with Ativan, Temazepam, and Lunesta. Patient has discontinued Ativan and Temazepam without issue, but is experiencing early morning awakenings. -Resume Lunesta 3mg  at bedtime as needed. -Consider moving Trazodone to  dinner time and adding Lunesta at bedtime.  Restless Leg Syndrome Patient reports occasional restless leg symptoms, but does not wish to resume benzodiazepines. -Continue current management strategy.  Metabolic Syndrome Patient has history of metabolic syndrome and is considering resuming Metformin. -Order labs to assess insulin levels and liver function. -Consider resuming Metformin if indicated.  General Health Maintenance -Encourage patient to continue with weight-bearing exercises at home to improve muscle strength. -Consider adding a probiotic for gut health. -Flu shot to be obtained at work. -Schedule follow-up in 3 months.         Danise Edge, MD

## 2023-09-04 NOTE — Patient Instructions (Signed)

## 2023-09-05 LAB — INSULIN, RANDOM: Insulin: 7.2 u[IU]/mL

## 2023-09-06 ENCOUNTER — Other Ambulatory Visit: Payer: Self-pay

## 2023-09-28 ENCOUNTER — Other Ambulatory Visit: Payer: Self-pay

## 2023-09-28 ENCOUNTER — Ambulatory Visit
Admission: EM | Admit: 2023-09-28 | Discharge: 2023-09-28 | Disposition: A | Discharge status: Home / Self Care | Payer: Commercial Managed Care - PPO | Attending: Nurse Practitioner | Admitting: Nurse Practitioner

## 2023-09-28 ENCOUNTER — Ambulatory Visit: Payer: Commercial Managed Care - PPO

## 2023-09-28 DIAGNOSIS — H5789 Other specified disorders of eye and adnexa: Secondary | ICD-10-CM

## 2023-09-28 MED ORDER — ERYTHROMYCIN 5 MG/GM OP OINT
TOPICAL_OINTMENT | Freq: Four times a day (QID) | OPHTHALMIC | 0 refills | Status: AC
Start: 2023-09-28 — End: 2023-10-05

## 2023-09-28 NOTE — ED Provider Notes (Signed)
[facility]    CSN: [id number] Arrival date & time: 09/28/23  1517      History   Chief Complaint No chief complaint on file.   HPI [name] Jensen is a 67 y.o. female.   Patient presents today with foreign body sensation to the right eye.  Reports she woke up this morning with watery eyes that feel dry.  No eye redness or drainage.  She denies contact lens use, had LASEK eye surgery 20 years ago.  Reports as the day has gone on, she has had more irritation and pain to the right eye.  Denies any new eye products or any recent eye injury.  Denies history of seasonal allergies, recent cough, congestion, or sore throat.  No headache, floaters in the vision, blurred vision, or double vision.  No known sick contacts or contacts with pinkeye.    Past Medical History:  Diagnosis Date   Adjustment reaction with anxiety and depression 06/12/2014   Anemia    h/o low ferritin   Anxiety    Asthma    environmental triggers   Chicken pox as a child   Cough 08/22/2015   Depression    Depression with anxiety 06/12/2014   Diverticulosis    Ehrlichiosis 2014   Gallstones    GERD (gastroesophageal reflux disease)    Hair loss 03/28/2017   Hiatal hernia 2007   History of viral illness    Hyperlipidemia, mixed 04/11/2015   Insomnia 08/21/2017   Lactose intolerance 06/12/2014   Mumps as a child   Overweight 08/19/2014   Rectocele 06/12/2014   RLS (restless legs syndrome)    SCC (squamous cell carcinoma) 06/12/2014   Right arm removed 2011   Sessile colonic polyp    SVT (supraventricular tachycardia) ([facility])    a. ablation in 05/2006   Tachycardia 06/12/2014   Thyroid disease    Vitamin D deficiency 08/22/2015    Patient Active Problem List   Diagnosis Date Noted   Arthralgia 01/31/2023   Valvular heart disease 01/31/2023   Diverticulosis 10/02/2022   Otalgia of left ear 08/23/2022   Vitamin D deficiency 06/20/2022   Hypotension 02/25/2022   Insulin resistance  11/07/2021   Arthritis 09/15/2020   Neck pain 04/26/2020   Palpitation 01/26/2020   IBS (irritable bowel syndrome) 03/18/2019   Diverticulitis 12/23/2018   Insomnia 08/21/2017   Hair loss 03/28/2017   Hyperlipidemia, mixed 04/11/2015   Hyperglycemia 04/11/2015   Abdominal pain 10/07/2014   Family history of colon cancer in mother per 2006 GI note Dr. 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   RESTLESS LEG SYNDROME 02/04/2009    Past Surgical History:  Procedure Laterality Date   ABDOMINAL HYSTERECTOMY  11-2008   total with cystocele, rectocele repair   APPENDECTOMY     CHOLECYSTECTOMY     COLONOSCOPY  2006   diverticulosis, hemorrhoids ([facility])   ESOPHAGOGASTRODUODENOSCOPY  2006   small hiatus hernia ([facility])   ESOPHAGOGASTRODUODENOSCOPY N/A 02/21/2018   Procedure: ESOPHAGOGASTRODUODENOSCOPY (EGD);  Surgeon: Malissa Hippo, MD;  Location: [facility];  Service: Endoscopy;  Laterality: N/A;  pt knows to arrive at 2:45   INCONTINENCE SURGERY     SKIN SURGERY     right arm scc   svt ablation     TONSILLECTOMY  1972   2ith adenoid   WISDOM TOOTH EXTRACTION  67 yrs old    OB History     Gravida  3   Para  2   Term      Preterm      AB  1   Living  2      SAB  1   IAB      Ectopic      Multiple      Live Births  2            Home Medications    Prior to Admission medications   Medication Sig Start Date End Date Taking? Authorizing Provider  erythromycin ophthalmic ointment Place into the right  eye 4 (four) times daily for 7 days. Place a 1/2 inch ribbon of ointment into the lower eyelid. 09/28/23 10/05/23 Yes Valentino Nose, NP  atorvastatin (LIPITOR) 10 MG tablet Take 1 tablet (10 mg total) by mouth daily. 06/11/23   Bradd Canary, MD  bisoprolol (ZEBETA) 5 MG tablet Take 1/2 tablet (2.5 mg total) by mouth daily.  Replaces Metoprolol. 02/01/23   Antoine Poche, MD  blood glucose meter kit and supplies KIT Dispense based on patient and insurance preference. Use up to four times daily as directed. (FOR ICD-9 250.00, 250.01). 01/17/21   Zola Button, Grayling Congress, DO  Cholecalciferol (VITAMIN D3) 125 MCG (5000 UT) CAPS Take 1 capsule (5,000 Units total) by mouth daily. 05/21/23   Bradd Canary, MD  clindamycin (CLEOCIN T) 1 % external solution Apply to face in the morning 05/16/23     conjugated estrogens (PREMARIN) vaginal cream Apply small amount to vaginal mucosa twice daily 06/20/22   Bradd Canary, MD  Eszopiclone 3 MG TABS Take 1 tablet (3 mg total) by mouth at bedtime. Take immediately before bedtime 09/04/23   Bradd Canary, MD  tirzepatide [facility]) 5 MG/0.5ML Pen Inject 5 mg into the skin once a week. 05/28/23   Bradd Canary, MD    Family History Family History  Problem Relation Age of Onset   Hypertension Mother    Hypertension Father    Hypertension Sister    Diabetes Sister        type 2   Fibromyalgia Sister    Multiple sclerosis Sister    Proteinuria Sister    GI Bleed Maternal Grandmother    Atrial fibrillation Maternal Grandmother    Heart disease Maternal Grandfather    Diabetes Paternal Grandmother    Stroke Paternal Grandfather    Colon cancer Neg Hx    Esophageal cancer Neg Hx    Rectal cancer Neg Hx    Stomach cancer Neg Hx     Social History Social History   Tobacco Use   Smoking status: Never   Smokeless tobacco: Never  Vaping Use   Vaping status: Never Used  Substance Use Topics   Alcohol use: No    Alcohol/week: 0.0 standard drinks  of alcohol   Drug use: No     Allergies   Dilaudid [hydromorphone hcl] and Prednisone   Review of Systems Review of Systems Per HPI  Physical Exam Triage Vital Signs ED Triage Vitals  Encounter Vitals Group     BP 09/28/23 1524  123/70     Systolic BP Percentile --      Diastolic BP Percentile --      Pulse Rate 09/28/23 1524 90     Resp 09/28/23 1524 17     Temp 09/28/23 1524 98.1 F (36.7 C)     Temp Source 09/28/23 1524 Oral     SpO2 09/28/23 1524 97 %     Weight --      Height --      Head Circumference --      Peak Flow --      Pain Score 09/28/23 1537 6     Pain Loc --      Pain Education --      Exclude from Growth Chart --    No data found.  Updated Vital Signs BP 123/70 (BP Location: Right Arm)   Pulse 90   Temp 98.1 F (36.7 C) (Oral)   Resp 17   LMP  (LMP Unknown)   SpO2 97%   Visual Acuity Right Eye Distance: 20/30 Left Eye Distance: 20/20 Bilateral Distance:    Right Eye Near:   Left Eye Near:    Bilateral Near:     Physical Exam Vitals and nursing note reviewed.  Constitutional:      General: She is not in acute distress.    Appearance: Normal appearance. She is not toxic-appearing.  HENT:     Head: Normocephalic and atraumatic.     Right Ear: External ear normal.     Left Ear: External ear normal.     Mouth/Throat:     Mouth: Mucous membranes are moist.     Pharynx: Oropharynx is clear.  Eyes:     General:        Right eye: No discharge.        Left eye: No discharge.     Extraocular Movements: Extraocular movements intact.     Right eye: Normal extraocular motion.     Left eye: Normal extraocular motion.     Conjunctiva/sclera:     Right eye: Right conjunctiva is not injected. No exudate or hemorrhage.    Left eye: Left conjunctiva is not injected. No exudate or hemorrhage.    Pupils: Pupils are equal, round, and reactive to light.     Right eye: No corneal abrasion or fluorescein uptake.  Pulmonary:     Effort: Pulmonary  effort is normal. No respiratory distress.  Musculoskeletal:     Cervical back: Normal range of motion.  Lymphadenopathy:     Cervical: No cervical adenopathy.  Skin:    General: Skin is warm and dry.     Coloration: Skin is not jaundiced or pale.     Findings: No erythema.  Neurological:     Mental Status: She is alert and oriented to person, place, and time.  Psychiatric:        Behavior: Behavior is cooperative.      UC Treatments / Results  Labs (all labs ordered are listed, but only abnormal results are displayed) Labs Reviewed - No data to display  EKG   Radiology No results found.  Procedures Procedures (including critical care time)  Medications Ordered in UC Medications - No data to display  Initial Impression / Assessment and Plan / UC Course  I have reviewed the triage vital signs and the nursing notes.  Pertinent labs & imaging results that were available during my care of the patient were reviewed by me and considered in my medical decision making (  see chart for details).   Patient is well-appearing, normotensive, afebrile, not tachycardic, not tachypneic, oxygenating well on room air.    1. Irritation of right eye No red flags in history or on exam Fluorescein stain performed-no uptake appreciated or obvious corneal abrasion Will cover patient with erythromycin ointment 4 times daily for 5 days Recommended following up with eye doctor with no improvement in symptoms despite treatment Strict ER precautions discussed with patient  The patient was given the opportunity to ask questions.  All questions answered to their satisfaction.  The patient is in agreement to this plan.    Final Clinical Impressions(s) / UC Diagnoses   Final diagnoses:  Irritation of right eye     Discharge Instructions      Use erythromycin ointment as prescribed.  Seek care if symptoms not improved with this treatment.     ED Prescriptions     Medication Sig Dispense  Auth. Provider   erythromycin ophthalmic ointment Place into the right eye 4 (four) times daily for 7 days. Place a 1/2 inch ribbon of ointment into the lower eyelid. 3.5 g Valentino Nose, NP      PDMP not reviewed this encounter.   Valentino Nose, NP 09/28/23 [phone]

## 2023-09-28 NOTE — Discharge Instructions (Addendum)
Use erythromycin ointment as prescribed.  Seek care if symptoms not improved with this treatment.

## 2023-09-28 NOTE — ED Triage Notes (Signed)
Pt reports both eyes were watery this morning then right eye pain developed.  Denies visual disturbances  Has flushed her right eye but no relief.

## 2023-10-04 ENCOUNTER — Other Ambulatory Visit: Payer: Self-pay

## 2023-10-04 ENCOUNTER — Other Ambulatory Visit (HOSPITAL_COMMUNITY): Payer: Self-pay

## 2023-10-06 ENCOUNTER — Other Ambulatory Visit (HOSPITAL_COMMUNITY): Payer: Self-pay

## 2023-10-08 ENCOUNTER — Other Ambulatory Visit (HOSPITAL_COMMUNITY): Payer: Self-pay

## 2023-10-08 ENCOUNTER — Other Ambulatory Visit: Payer: Self-pay

## 2023-10-11 ENCOUNTER — Other Ambulatory Visit (HOSPITAL_COMMUNITY): Payer: Self-pay

## 2023-10-15 ENCOUNTER — Other Ambulatory Visit: Payer: Self-pay

## 2023-10-26 ENCOUNTER — Other Ambulatory Visit (HOSPITAL_COMMUNITY): Payer: Self-pay

## 2023-10-29 ENCOUNTER — Other Ambulatory Visit (HOSPITAL_COMMUNITY): Payer: Self-pay | Admitting: Family Medicine

## 2023-10-29 DIAGNOSIS — Z1231 Encounter for screening mammogram for malignant neoplasm of breast: Secondary | ICD-10-CM

## 2023-11-15 ENCOUNTER — Other Ambulatory Visit (HOSPITAL_COMMUNITY): Payer: Self-pay

## 2023-11-16 ENCOUNTER — Other Ambulatory Visit: Payer: Self-pay

## 2023-11-17 ENCOUNTER — Other Ambulatory Visit (HOSPITAL_COMMUNITY): Payer: Self-pay

## 2023-11-19 ENCOUNTER — Other Ambulatory Visit: Payer: Self-pay

## 2023-11-19 ENCOUNTER — Other Ambulatory Visit (HOSPITAL_COMMUNITY): Payer: Self-pay

## 2023-11-20 ENCOUNTER — Other Ambulatory Visit (HOSPITAL_COMMUNITY): Payer: Self-pay

## 2023-11-20 ENCOUNTER — Other Ambulatory Visit: Payer: Self-pay

## 2023-11-21 ENCOUNTER — Other Ambulatory Visit (HOSPITAL_COMMUNITY): Payer: Self-pay

## 2023-11-22 ENCOUNTER — Other Ambulatory Visit: Payer: Self-pay

## 2023-11-26 ENCOUNTER — Ambulatory Visit: Payer: Commercial Managed Care - PPO | Admitting: Family Medicine

## 2023-12-10 ENCOUNTER — Encounter: Payer: Self-pay | Admitting: Obstetrics

## 2023-12-10 DIAGNOSIS — N952 Postmenopausal atrophic vaginitis: Secondary | ICD-10-CM | POA: Diagnosis not present

## 2023-12-11 ENCOUNTER — Other Ambulatory Visit (HOSPITAL_COMMUNITY): Payer: Self-pay

## 2023-12-11 MED ORDER — ESTRADIOL 10 MCG VA TABS
10.0000 ug | ORAL_TABLET | VAGINAL | 3 refills | Status: AC
  Filled 2024-01-16 – 2024-01-21 (×2): qty 24, 84d supply, fill #0
  Filled 2024-07-03: qty 24, 84d supply, fill #1
  Filled 2024-09-20: qty 24, 84d supply, fill #2

## 2023-12-11 MED ORDER — ESTRADIOL 10 MCG VA TABS
ORAL_TABLET | VAGINAL | 0 refills | Status: AC
  Filled 2023-12-11: qty 18, 28d supply, fill #0

## 2023-12-12 ENCOUNTER — Other Ambulatory Visit (HOSPITAL_COMMUNITY): Payer: Self-pay

## 2023-12-12 ENCOUNTER — Other Ambulatory Visit: Payer: Self-pay

## 2023-12-12 ENCOUNTER — Ambulatory Visit (HOSPITAL_COMMUNITY)
Admission: RE | Admit: 2023-12-12 | Discharge: 2023-12-12 | Disposition: A | Discharge status: Home / Self Care | Payer: Commercial Managed Care - PPO | Source: Ambulatory Visit | Attending: Family Medicine | Admitting: Family Medicine

## 2023-12-12 DIAGNOSIS — Z1231 Encounter for screening mammogram for malignant neoplasm of breast: Secondary | ICD-10-CM | POA: Diagnosis not present

## 2023-12-17 ENCOUNTER — Ambulatory Visit (HOSPITAL_COMMUNITY): Payer: Commercial Managed Care - PPO

## 2023-12-19 ENCOUNTER — Other Ambulatory Visit (HOSPITAL_COMMUNITY): Payer: Self-pay

## 2023-12-19 ENCOUNTER — Ambulatory Visit (HOSPITAL_COMMUNITY)
Admission: RE | Admit: 2023-12-19 | Discharge: 2023-12-19 | Disposition: A | Discharge status: Home / Self Care | Payer: Commercial Managed Care - PPO | Source: Ambulatory Visit | Attending: Cardiology | Admitting: Cardiology

## 2023-12-19 ENCOUNTER — Other Ambulatory Visit: Payer: Self-pay

## 2023-12-19 DIAGNOSIS — I351 Nonrheumatic aortic (valve) insufficiency: Secondary | ICD-10-CM | POA: Diagnosis not present

## 2023-12-19 LAB — ECHOCARDIOGRAM COMPLETE
Area-P 1/2: 2.34 cm2
P 1/2 time: 462 ms
S' Lateral: 3.3 cm

## 2023-12-19 NOTE — Progress Notes (Signed)
*  PRELIMINARY RESULTS* Echocardiogram 2D Echocardiogram has been performed.  [name] Jensen 12/19/2023, 2:53 PM

## 2023-12-20 ENCOUNTER — Other Ambulatory Visit: Payer: Self-pay

## 2023-12-20 ENCOUNTER — Other Ambulatory Visit: Payer: Self-pay | Admitting: Family Medicine

## 2023-12-20 ENCOUNTER — Other Ambulatory Visit (HOSPITAL_COMMUNITY): Payer: Self-pay

## 2023-12-20 DIAGNOSIS — E782 Mixed hyperlipidemia: Secondary | ICD-10-CM

## 2023-12-20 MED ORDER — ATORVASTATIN CALCIUM 10 MG PO TABS
10.0000 mg | ORAL_TABLET | Freq: Every day | ORAL | 0 refills | Status: DC
  Filled 2023-12-20: qty 90, 90d supply, fill #0

## 2023-12-21 ENCOUNTER — Other Ambulatory Visit (HOSPITAL_COMMUNITY): Payer: Commercial Managed Care - PPO

## 2023-12-24 ENCOUNTER — Other Ambulatory Visit: Payer: Self-pay

## 2023-12-24 ENCOUNTER — Other Ambulatory Visit (HOSPITAL_COMMUNITY): Payer: Self-pay

## 2023-12-25 ENCOUNTER — Other Ambulatory Visit (HOSPITAL_COMMUNITY): Payer: Self-pay

## 2023-12-31 ENCOUNTER — Ambulatory Visit: Payer: Commercial Managed Care - PPO | Admitting: Family Medicine

## 2024-01-01 ENCOUNTER — Ambulatory Visit: Payer: Commercial Managed Care - PPO | Admitting: Family Medicine

## 2024-01-08 ENCOUNTER — Ambulatory Visit: Payer: Commercial Managed Care - PPO | Admitting: Family Medicine

## 2024-01-09 ENCOUNTER — Encounter: Payer: Self-pay | Admitting: Cardiology

## 2024-01-09 ENCOUNTER — Ambulatory Visit: Payer: Commercial Managed Care - PPO | Attending: Cardiology | Admitting: Cardiology

## 2024-01-09 VITALS — BP 114/58 | HR 87 | Ht 64.0 in | Wt 131.0 lb

## 2024-01-09 DIAGNOSIS — I351 Nonrheumatic aortic (valve) insufficiency: Secondary | ICD-10-CM | POA: Diagnosis not present

## 2024-01-09 DIAGNOSIS — I471 Supraventricular tachycardia, unspecified: Secondary | ICD-10-CM

## 2024-01-09 DIAGNOSIS — I4711 Inappropriate sinus tachycardia, so stated: Secondary | ICD-10-CM | POA: Diagnosis not present

## 2024-01-09 MED ORDER — IVABRADINE HCL 5 MG PO TABS
5.0000 mg | ORAL_TABLET | Freq: Two times a day (BID) | ORAL | 11 refills | Status: DC
  Filled 2024-01-09: qty 60, 30d supply, fill #0
  Filled 2024-02-04: qty 180, 90d supply, fill #1
  Filled 2024-05-12 – 2024-05-13 (×2): qty 180, 90d supply, fill #2
  Filled 2024-08-06: qty 180, 90d supply, fill #3
  Filled 2024-11-04: qty 120, 60d supply, fill #4

## 2024-01-09 NOTE — Patient Instructions (Addendum)
 Medication Instructions:  Your physician has recommended you make the following change in your medication:   -Start Corlanor 5 mg twice daily   *If you need a refill on your cardiac medications before your next appointment, please call your pharmacy*   Lab Work: None If you have labs (blood work) drawn today and your tests are completely normal, you will receive your results only by: MyChart Message (if you have MyChart) OR A paper copy in the mail If you have any lab test that is abnormal or we need to change your treatment, we will call you to review the results.   Testing/Procedures: Your physician has requested that you have an echocardiogram. Echocardiography is a painless test that uses sound waves to create images of your heart. It provides your doctor with information about the size and shape of your heart and how well your heart's chambers and valves are working. This procedure takes approximately one hour. There are no restrictions for this procedure. Please do NOT wear cologne, perfume, aftershave, or lotions (deodorant is allowed). Please arrive 15 minutes prior to your appointment time.  Please note: We ask at that you not bring children with you during ultrasound (echo/ vascular) testing. Due to room size and safety concerns, children are not allowed in the ultrasound rooms during exams. Our front office staff cannot provide observation of children in our lobby area while testing is being conducted. An adult accompanying a patient to their appointment will only be allowed in the ultrasound room at the discretion of the ultrasound technician under special circumstances. We apologize for any inconvenience.     Follow-Up: At [facility], you and your health needs are our priority.  As part of our continuing mission to provide you with exceptional heart care, we have created designated Provider Care Teams.  These Care Teams include your primary Cardiologist (physician)  and Advanced Practice Providers (APPs -  Physician Assistants and Nurse Practitioners) who all work together to provide you with the care you need, when you need it.  We recommend signing up for the patient portal called MyChart.  Sign up information is provided on this After Visit Summary.  MyChart is used to connect with patients for Virtual Visits (Telemedicine).  Patients are able to view lab/test results, encounter notes, upcoming appointments, etc.  Non-urgent messages can be sent to your provider as well.   To learn more about what you can do with MyChart, go to [email or web address].    Your next appointment:   1 year(s)  Provider:   You may see Dorn Ross, MD or one of the following Advanced Practice Providers on your designated Care Team:   Laymon Qua, PA-C  Olivia Pavy, [place]     Other Instructions

## 2024-01-09 NOTE — Progress Notes (Signed)
 Clinical Summary Ms. 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 Had tolerated toprol  previously. Did not tolerate CCB in the past.  - 01/2022 monitor 12 days: PACs, couplets and triplets rare. Rare PVCs. No significant arrhythmias   - has been on bisoprolol  2.5mg  daily - typically 4AM in mornings, different from SVT.  - limiting caffeine - can have orthostatic symptoms at times. 117 to 90 with standing on one bp check, has to be very consistent with hydration to prevent.      - early AM 3-4AM feels palpitations.  - takes bisoprolol  10PM. Can have some soft bp's with higher doses of bisoprolol .  - no caffeine, no heavy EtOH. - EKG today NSR, chronic nonspecific ST/T changes   2. Aortic regurgitation - Jan 2023 echo: LVEF 50-55%, mild to mod AI 01/2023 echo: LVEF 45%, grade I dd, moderate AI - reviewed echo images. Patient also came back few days later for additional images which I reviewed, the additional images wall motion better visualized suggesting LVEF 50-55%    Jan 2025 echo :LVEF 50-55%, grade I dd, normal RV, mild to mod AI     3. Hyperlipidemia - she is on atorvastatin  10mg  daily - 01/29/23 TC 147 TG 117 HDL 51 LDL 72 - 10 year ASCVD risk 4.1%  09/2023 TC 190 TG 116 HDL 60 LDL 107 ASCVD risk as of today 3.9% 01/2018 coronary CTA: coronary calcium  score of 0, no CAD      4. Weight loss - down about 70 lbs - has been on mounjaro  Past Medical History:  Diagnosis Date   Adjustment reaction with anxiety and depression 06/12/2014   Anemia    h/o low ferritin   Anxiety    Asthma    environmental triggers   Chicken pox as a child   Cough 08/22/2015   Depression    Depression with anxiety 06/12/2014   Diverticulosis    Ehrlichiosis 2014    Gallstones    GERD (gastroesophageal reflux disease)    Hair loss 03/28/2017   Hiatal hernia 2007   History of viral illness    Hyperlipidemia, mixed 04/11/2015   Insomnia 08/21/2017   Lactose intolerance 06/12/2014   Mumps as a child   Overweight 08/19/2014   Rectocele 06/12/2014   RLS (restless legs syndrome)    SCC (squamous cell carcinoma) 06/12/2014   Right arm removed 2011   Sessile colonic polyp    SVT (supraventricular tachycardia) (HCC)    a. ablation in 05/2006   Tachycardia 06/12/2014   Thyroid  disease    Vitamin D  deficiency 08/22/2015     Allergies  Allergen Reactions   Dilaudid [Hydromorphone Hcl] Other (See Comments)    Respiratory , cardiac    Prednisone  Palpitations    Tachycardia up to 166     Current Outpatient Medications  Medication Sig Dispense Refill   atorvastatin  (LIPITOR) 10 MG tablet Take 1 tablet (10 mg total) by mouth daily. 90 tablet 0   bisoprolol  (ZEBETA ) 5 MG tablet Take 1/2 tablet (2.5 mg total) by mouth daily.  Replaces Metoprolol . 45 tablet 3   blood glucose meter kit and supplies KIT Dispense based on patient and insurance preference. Use up to four times daily as directed. (FOR ICD-9 250.00, 250.01). 1 each 0  Cholecalciferol  (VITAMIN D3) 125 MCG (5000 UT) CAPS Take 1 capsule (5,000 Units total) by mouth daily. 90 capsule 1   clindamycin  (CLEOCIN  T) 1 % external solution Apply to face in the morning 60 mL 2   conjugated estrogens  (PREMARIN ) vaginal cream Apply small amount to vaginal mucosa twice daily 42.5 g 5   Estradiol  (YUVAFEM ) 10 MCG TABS vaginal tablet Place 1 tablet (10 mcg total) vaginally at bedtime for 14 days, THEN 1 tablet (10 mcg total) 2 (two) times a week 18 tablet 0   Estradiol  (YUVAFEM ) 10 MCG TABS vaginal tablet Place 1 tablet (10 mcg total) vaginally 2 (two) times a week. 24 tablet 3   Eszopiclone  3 MG TABS Take 1 tablet (3 mg total) by mouth at bedtime. Take immediately before bedtime 30 tablet 2   tirzepatide   (MOUNJARO ) 5 MG/0.5ML Pen Inject 5 mg into the skin once a week. 6 mL 3   No current facility-administered medications for this visit.     Past Surgical History:  Procedure Laterality Date   ABDOMINAL HYSTERECTOMY  11/03/2008   total with cystocele, rectocele repair   APPENDECTOMY     CHOLECYSTECTOMY     COLONOSCOPY  12/04/2004   diverticulosis, hemorrhoids ([facility])   ESOPHAGOGASTRODUODENOSCOPY  12/04/2004   small hiatus hernia ([facility])   ESOPHAGOGASTRODUODENOSCOPY N/A 02/21/2018   Procedure: ESOPHAGOGASTRODUODENOSCOPY (EGD);  Surgeon: Golda Claudis PENNER, MD;  Location: [facility];  Service: Endoscopy;  Laterality: N/A;  pt knows to arrive at 2:45   INCONTINENCE SURGERY     SKIN SURGERY     right arm scc   svt ablation     TONSILLECTOMY  12/04/1970   2ith adenoid   WISDOM TOOTH EXTRACTION  68 yrs old     Allergies  Allergen Reactions   Dilaudid [Hydromorphone Hcl] Other (See Comments)    Respiratory , cardiac    Prednisone  Palpitations    Tachycardia up to 166      Family History  Problem Relation Age of Onset   Hypertension Mother    Hypertension Father    Hypertension Sister    Diabetes Sister        type 2   Fibromyalgia Sister    Multiple sclerosis Sister    Proteinuria Sister    GI Bleed Maternal Grandmother    Atrial fibrillation Maternal Grandmother    Heart disease Maternal Grandfather    Diabetes Paternal Grandmother    Stroke Paternal Grandfather    Colon cancer Neg Hx    Esophageal cancer Neg Hx    Rectal cancer Neg Hx    Stomach cancer Neg Hx      Social History Ms. Stenberg reports that she has never smoked. She has never used smokeless tobacco. Ms. Althaus reports no history of alcohol use.     Physical Examination Today's Vitals   01/09/24 1357  BP: (!) 114/58  Pulse: 87  SpO2: 97%  Weight: 131 lb (59.4 kg)  Height: 5' 4 (1.626 m)   Body mass index is 22.49 kg/m.  Gen: resting comfortably, no acute distress HEENT: no  scleral icterus, pupils equal round and reactive, no palptable cervical adenopathy,  CV: RRR, no mrg/, no jvd Resp: Clear to auscultation bilaterally GI: abdomen is soft, non-tender, non-distended, normal bowel sounds, no hepatosplenomegaly MSK: extremities are warm, no edema.  Skin: warm, no rash Neuro:  no focal deficits Psych: appropriate affect   Diagnostic Studies 01/2018 coronary CTA IMPRESSION: 1. Coronary calcium  score of 0. This was 0 percentile  for age and sex matched control.   2. Normal coronary origin with right dominance.   3. No evidence of CAD.   4. Mildly dilated pulmonary artery measuring 33 mm suggestive of pulmonary hypertension.       01/2023 echo   1. Left ventricular ejection fraction, by estimation, is 45%. The left  ventricle has mildly decreased function. The left ventricle demonstrates  global hypokinesis. Left ventricular diastolic parameters are consistent  with Grade I diastolic dysfunction  (impaired relaxation).   2. Right ventricular systolic function was not well visualized. The right  ventricular size is normal. Tricuspid regurgitation signal is inadequate  for assessing PA pressure.   3. The mitral valve is normal in structure. No evidence of mitral valve  regurgitation. No evidence of mitral stenosis.   4. The aortic valve is tricuspid. Aortic valve regurgitation is moderate.  No aortic stenosis is present.   5. The inferior vena cava is normal in size with greater than 50%  respiratory variability, suggesting right atrial pressure of 3 mmHg.     Assessment and Plan  Palpitations/SVT/Inappropriate sinus tachycardia.  - some elevated heart rates first thing in the morning - recent monitor with just benign ectopy - limited on beta blocker dosing due to low bp's at times, orthostatic symptoms at times - will try adding corlanor 5mg  bid for inappropriate sinus tachycardia.    2. Aortic regurgitation - mild to mod AI by echo, repeat  echo 1 year.    3. Hyperlipidemia -low risk ASCVD risk score, 2019 coronary CTA calcium  score was 0 - history of metabolic syndrome, prediabetes prior to significant weight loss few years ago - LDL reasonable given her overall low risk, continue statin for overall cardiovascular benefits.      4.DOE - chronic symptoms - extensive benign cardiac workup over the last few years with coroanry CTA, echo, heart monitor without significant pathology - monitor at this time, discussed regular physical activity.       Dorn PHEBE Ross, M.D.

## 2024-01-10 ENCOUNTER — Other Ambulatory Visit (HOSPITAL_COMMUNITY): Payer: Self-pay

## 2024-01-10 ENCOUNTER — Other Ambulatory Visit: Payer: Self-pay

## 2024-01-15 ENCOUNTER — Other Ambulatory Visit: Payer: Self-pay | Admitting: Family Medicine

## 2024-01-16 ENCOUNTER — Other Ambulatory Visit: Payer: Self-pay

## 2024-01-16 ENCOUNTER — Other Ambulatory Visit (HOSPITAL_COMMUNITY): Payer: Self-pay

## 2024-01-16 MED ORDER — ESZOPICLONE 3 MG PO TABS
3.0000 mg | ORAL_TABLET | Freq: Every day | ORAL | 2 refills | Status: DC
  Filled 2024-01-16: qty 30, 30d supply, fill #0
  Filled 2024-03-04: qty 30, 30d supply, fill #1
  Filled 2024-04-02 (×2): qty 21, 21d supply, fill #2
  Filled 2024-04-02: qty 30, 30d supply, fill #2
  Filled 2024-04-02 (×2): qty 9, 9d supply, fill #2

## 2024-01-16 NOTE — Telephone Encounter (Signed)
Requesting: Eszopiclone 3 mg  Contract: N/A UDS: N/A Last Visit: 09/04/2023 Next Visit: 01/31/2024 Last Refill: 09/04/2023  Please Advise

## 2024-01-19 ENCOUNTER — Other Ambulatory Visit (HOSPITAL_COMMUNITY): Payer: Self-pay

## 2024-01-21 ENCOUNTER — Other Ambulatory Visit: Payer: Self-pay

## 2024-01-22 ENCOUNTER — Other Ambulatory Visit (HOSPITAL_COMMUNITY): Payer: Self-pay

## 2024-01-29 ENCOUNTER — Ambulatory Visit: Payer: Commercial Managed Care - PPO | Admitting: Cardiology

## 2024-01-31 ENCOUNTER — Encounter: Payer: Self-pay | Admitting: Family Medicine

## 2024-01-31 ENCOUNTER — Ambulatory Visit: Payer: Commercial Managed Care - PPO | Admitting: Family Medicine

## 2024-01-31 VITALS — BP 126/81 | HR 87 | Ht 64.0 in | Wt 138.0 lb

## 2024-01-31 DIAGNOSIS — Z79899 Other long term (current) drug therapy: Secondary | ICD-10-CM

## 2024-01-31 DIAGNOSIS — R5383 Other fatigue: Secondary | ICD-10-CM | POA: Diagnosis not present

## 2024-01-31 DIAGNOSIS — L853 Xerosis cutis: Secondary | ICD-10-CM

## 2024-01-31 DIAGNOSIS — E782 Mixed hyperlipidemia: Secondary | ICD-10-CM

## 2024-01-31 DIAGNOSIS — G4700 Insomnia, unspecified: Secondary | ICD-10-CM

## 2024-01-31 DIAGNOSIS — E063 Autoimmune thyroiditis: Secondary | ICD-10-CM | POA: Diagnosis not present

## 2024-01-31 DIAGNOSIS — R739 Hyperglycemia, unspecified: Secondary | ICD-10-CM

## 2024-01-31 DIAGNOSIS — M2550 Pain in unspecified joint: Secondary | ICD-10-CM

## 2024-01-31 DIAGNOSIS — E559 Vitamin D deficiency, unspecified: Secondary | ICD-10-CM

## 2024-01-31 DIAGNOSIS — E88819 Insulin resistance, unspecified: Secondary | ICD-10-CM

## 2024-01-31 LAB — IBC + FERRITIN
Ferritin: 229.5 ng/mL (ref 10.0–291.0)
Iron: 97 ug/dL (ref 42–145)
Saturation Ratios: 30.5 % (ref 20.0–50.0)
TIBC: 317.8 ug/dL (ref 250.0–450.0)
Transferrin: 227 mg/dL (ref 212.0–360.0)

## 2024-01-31 LAB — TSH: TSH: 1.46 u[IU]/mL (ref 0.35–5.50)

## 2024-01-31 LAB — MICROALBUMIN / CREATININE URINE RATIO
Creatinine,U: 253.3 mg/dL
Microalb Creat Ratio: 6.5 mg/g (ref 0.0–30.0)
Microalb, Ur: 1.6 mg/dL (ref 0.0–1.9)

## 2024-01-31 LAB — CBC WITH DIFFERENTIAL/PLATELET
Basophils Absolute: 0.1 10*3/uL (ref 0.0–0.1)
Basophils Relative: 0.8 % (ref 0.0–3.0)
Eosinophils Absolute: 0.1 10*3/uL (ref 0.0–0.7)
Eosinophils Relative: 1.2 % (ref 0.0–5.0)
HCT: 39.1 % (ref 36.0–46.0)
Hemoglobin: 13.3 g/dL (ref 12.0–15.0)
Lymphocytes Relative: 43.8 % (ref 12.0–46.0)
Lymphs Abs: 3.1 10*3/uL (ref 0.7–4.0)
MCHC: 33.9 g/dL (ref 30.0–36.0)
MCV: 84.2 fL (ref 78.0–100.0)
Monocytes Absolute: 0.3 10*3/uL (ref 0.1–1.0)
Monocytes Relative: 4.8 % (ref 3.0–12.0)
Neutro Abs: 3.6 10*3/uL (ref 1.4–7.7)
Neutrophils Relative %: 49.4 % (ref 43.0–77.0)
Platelets: 325 10*3/uL (ref 150.0–400.0)
RBC: 4.64 Mil/uL (ref 3.87–5.11)
RDW: 13.3 % (ref 11.5–15.5)
WBC: 7.2 10*3/uL (ref 4.0–10.5)

## 2024-01-31 LAB — B12 AND FOLATE PANEL
Folate: 10.3 ng/mL (ref >5.9)
Vitamin B-12: 375 pg/mL (ref 211–911)

## 2024-01-31 LAB — COMPREHENSIVE METABOLIC PANEL
ALT: 20 U/L (ref 0–35)
AST: 17 U/L (ref 0–37)
Albumin: 4.3 g/dL (ref 3.5–5.2)
Alkaline Phosphatase: 71 U/L (ref 39–117)
BUN: 15 mg/dL (ref 6–23)
CO2: 29 meq/L (ref 19–32)
Calcium: 9.3 mg/dL (ref 8.4–10.5)
Chloride: 103 meq/L (ref 96–112)
Creatinine, Ser: 0.79 mg/dL (ref 0.40–1.20)
GFR: 78.45 mL/min (ref >60.00)
Glucose, Bld: 88 mg/dL (ref 70–99)
Potassium: 4.2 meq/L (ref 3.5–5.1)
Sodium: 139 meq/L (ref 135–145)
Total Bilirubin: 0.8 mg/dL (ref 0.2–1.2)
Total Protein: 7 g/dL (ref 6.0–8.3)

## 2024-01-31 LAB — LIPID PANEL
Cholesterol: 160 mg/dL (ref 0–200)
HDL: 65.9 mg/dL (ref >39.00)
LDL Cholesterol: 75 mg/dL (ref 0–99)
NonHDL: 94.48
Total CHOL/HDL Ratio: 2
Triglycerides: 97 mg/dL (ref 0.0–149.0)
VLDL: 19.4 mg/dL (ref 0.0–40.0)

## 2024-01-31 LAB — HEMOGLOBIN A1C: Hgb A1c MFr Bld: 5.5 % (ref 4.6–6.5)

## 2024-01-31 NOTE — Assessment & Plan Note (Signed)
 Stable at most recent check a few months ago.

## 2024-01-31 NOTE — Assessment & Plan Note (Signed)
 On Atorvastatin 10mg . Recent LDL slightly elevated. -Repeat lipid panel. -Continue Atorvastatin 10mg  daily.

## 2024-01-31 NOTE — Assessment & Plan Note (Signed)
 Reports of increased appetite and weight gain. Currently on Mounjaro. -Check fasting insulin and A1c.

## 2024-01-31 NOTE — Progress Notes (Signed)
 Established Patient Office Visit  Subjective   Patient ID: [name] Jensen, female    DOB: [birth year]  Age: 68 y.o. MRN: [id number]  Chief Complaint  Patient presents with   Medical Management of Chronic Issues    HPI  Discussed the use of AI scribe software for clinical note transcription with the patient, who gave verbal consent to proceed.  History of Present Illness [name] Jensen "[name] Jensen" is a 68 year old female who presents for a regular follow-up.  She has been experiencing generalized joint pain and fatigue for over a year, describing herself as 'miserable' and 'beyond miserable.' The joint pain affects her knees, hips, elbows, and includes femoral pain. Extensive testing for inflammatory markers has been normal. Her fatigue is described as 'unbelievable,' feeling as though her body is not keeping up with her mind.  She has symptoms suggestive of Sjgren's syndrome, including dry eyes, recurrent canker sores in her mouth every six weeks, dry and flaking skin, and broad gums. She sleeps with her mouth open. Her dentist suspects sleep apnea due to teeth grinding, but a pulmonary evaluation did not meet the criteria for diagnosis. She notes that her symptoms worsened after losing weight.  She was switched from bisoprolol to Corlanor for inappropriate tachycardia, but this did not improve her energy levels. She has a history of supraventricular tachycardia (SVT) ablation 15 years ago and has been on beta blockers since then.  She is concerned about her metabolic health, noting a history of high insulin levels and metabolic syndrome. Her appetite has increased recently, and she has gained weight, rising from a stable 130-132 pounds to 138 pounds. She is on [facility], which initially helped with appetite suppression, but she feels its effects may be waning. She is also on atorvastatin for cholesterol management, with recent LDL levels slightly elevated.  Her current medications  include Lipitor 10 mg, vitamin D 5000 IU daily, Lunesta, and trazodone 100 mg. She takes trazodone at dinner and Lunesta at bedtime but reports only sleeping 4-5 hours. She has tried a bite guard for teeth grinding, but it has not alleviated her symptoms.       ROS All review of systems negative except what is listed in the HPI    Objective:     BP 126/81   Pulse 87   Ht 5\' 4"  (1.626 m)   Wt 138 lb (62.6 kg)   LMP  (LMP Unknown)   SpO2 98%   BMI 23.69 kg/m    Physical Exam Vitals reviewed.  Constitutional:      Appearance: Normal appearance.  Cardiovascular:     Rate and Rhythm: Normal rate and regular rhythm.     Heart sounds: Normal heart sounds.  Pulmonary:     Effort: Pulmonary effort is normal.     Breath sounds: Normal breath sounds.  Skin:    General: Skin is warm and dry.  Neurological:     Mental Status: She is alert and oriented to person, place, and time.  Psychiatric:        Mood and Affect: Mood normal.        Behavior: Behavior normal.        Thought Content: Thought content normal.        Judgment: Judgment normal.      No results found for any visits on 01/31/24.    The 10-year ASCVD risk score (Arnett DK, et al., 2019) is: 9.6%    Assessment & Plan:   Problem List  Items Addressed This Visit       Active Problems   Euthyroid Hashimoto's thyroiditis (Chronic)   Still not feeling great. Labs today.       Relevant Orders   TSH   Hyperlipidemia, mixed   On Atorvastatin 10mg . Recent LDL slightly elevated. -Repeat lipid panel. -Continue Atorvastatin 10mg  daily.      Relevant Orders   Comprehensive metabolic panel   Lipid panel   Hyperglycemia   Reports of increased appetite and weight gain. Currently on Mounjaro. -Check fasting insulin and A1c.       Relevant Orders   Insulin, random   Insomnia   -Continue current regimen. -Consider sleep hygiene measures and possible adjustment of medications.      Insulin resistance -  Primary   Labs today       Relevant Orders   Hemoglobin A1c   Microalbumin / creatinine urine ratio   Vitamin D deficiency   Stable at most recent check a few months ago.       Other Visit Diagnoses       Polyarthralgia     Reports of joint pain, fatigue, dry eyes, dry mouth, and dry skin. Previous ANA was negative. Dentist and suggests possible Sjogren's syndrome. -Repeat labs today. Supportive measures discussed.     Relevant Orders   Antinuclear Antib (ANA)     Fatigue, unspecified type       Relevant Orders   B12 and Folate Panel   IBC + Ferritin   CBC with Differential/Platelet   Comprehensive metabolic panel   Antinuclear Antib (ANA)     Dry skin       Relevant Orders   Antinuclear Antib (ANA)     High risk medication use       Relevant Orders   Drug Tox Monitor 1 w/Conf, Oral Fld       Return in about 3 months (around 04/29/2024) for routine follow-up.    Clayborne Dana, NP

## 2024-01-31 NOTE — Assessment & Plan Note (Signed)
 Still not feeling great. Labs today.

## 2024-01-31 NOTE — Assessment & Plan Note (Signed)
-  Continue current regimen. -Consider sleep hygiene measures and possible adjustment of medications.

## 2024-01-31 NOTE — Assessment & Plan Note (Signed)
 Labs today

## 2024-02-01 ENCOUNTER — Encounter: Payer: Self-pay | Admitting: Family Medicine

## 2024-02-03 LAB — DRUG TOX MONITOR 1 W/CONF, ORAL FLD

## 2024-02-03 LAB — ANA: Anti Nuclear Antibody (ANA): NEGATIVE

## 2024-02-03 LAB — INSULIN, RANDOM: Insulin: 6.3 u[IU]/mL

## 2024-02-04 ENCOUNTER — Other Ambulatory Visit (HOSPITAL_COMMUNITY): Payer: Self-pay

## 2024-02-05 ENCOUNTER — Other Ambulatory Visit: Payer: Self-pay | Admitting: Family Medicine

## 2024-02-05 ENCOUNTER — Other Ambulatory Visit: Payer: Self-pay

## 2024-02-05 ENCOUNTER — Other Ambulatory Visit (HOSPITAL_COMMUNITY): Payer: Self-pay

## 2024-02-05 MED ORDER — TRAZODONE HCL 100 MG PO TABS
100.0000 mg | ORAL_TABLET | Freq: Every day | ORAL | 1 refills | Status: DC
  Filled 2024-02-05: qty 90, 90d supply, fill #0
  Filled 2024-05-12 – 2024-05-13 (×2): qty 90, 90d supply, fill #1

## 2024-02-14 ENCOUNTER — Other Ambulatory Visit: Payer: Self-pay

## 2024-02-14 ENCOUNTER — Other Ambulatory Visit (HOSPITAL_COMMUNITY): Payer: Self-pay

## 2024-03-05 ENCOUNTER — Other Ambulatory Visit (HOSPITAL_COMMUNITY): Payer: Self-pay

## 2024-03-05 ENCOUNTER — Other Ambulatory Visit: Payer: Self-pay

## 2024-03-06 ENCOUNTER — Other Ambulatory Visit (HOSPITAL_COMMUNITY): Payer: Self-pay

## 2024-03-06 ENCOUNTER — Other Ambulatory Visit: Payer: Self-pay

## 2024-03-06 DIAGNOSIS — E119 Type 2 diabetes mellitus without complications: Secondary | ICD-10-CM | POA: Diagnosis not present

## 2024-03-06 DIAGNOSIS — H04123 Dry eye syndrome of bilateral lacrimal glands: Secondary | ICD-10-CM | POA: Diagnosis not present

## 2024-03-06 DIAGNOSIS — H2513 Age-related nuclear cataract, bilateral: Secondary | ICD-10-CM | POA: Diagnosis not present

## 2024-03-06 LAB — HM DIABETES EYE EXAM

## 2024-03-06 MED ORDER — CROMOLYN SODIUM 4 % OP SOLN
1.0000 [drp] | Freq: Four times a day (QID) | OPHTHALMIC | 11 refills | Status: AC
Start: 2024-03-06 — End: ?
  Filled 2024-03-06: qty 10, 50d supply, fill #0
  Filled 2024-04-21: qty 10, 50d supply, fill #1
  Filled 2024-06-10: qty 10, 50d supply, fill #2
  Filled 2024-10-16: qty 10, 50d supply, fill #3

## 2024-03-13 ENCOUNTER — Other Ambulatory Visit (HOSPITAL_COMMUNITY): Payer: Self-pay

## 2024-03-14 ENCOUNTER — Ambulatory Visit: Payer: Self-pay

## 2024-03-14 NOTE — Telephone Encounter (Signed)
 Copied from CRM [id number]. Topic: Clinical - Red Word Triage >> Mar 14, 2024  9:20 AM Drema Balzarine wrote: Red Word that prompted transfer to Nurse Triage: Patient having mental health/anxiety issues due to ex spouse being extremely sick, she's boarding a flight and wants to discuss anxiety, requested an appointment for Monday   Chief Complaint: Anxiety Symptoms: anxiety Frequency: currently experiencing since event Monday Pertinent Negatives: Patient denies suicidal ideations Disposition: [] ED /[] Urgent Care (no appt availability in office) / [x] Appointment(In office/virtual)/ []  [place] [facility]/ [] Home Care/ [] Refused Recommended Disposition /[] [place] [facility]/ []  Follow-up with PCP Additional Notes: Patient is currently going through a traumatic event-- She states that her children's father is very sick in a hospital and has been there since Monday. Patient advised that she is going to the airport right now after picking up her son to go to [place] where her children's father is at this time.  She states that he is in the Neuro ICU and she states that she feels like she needs someone to talk to and possibly medication to help her through this time.  She states that she was taking Ativan a long time ago.  Patient was upset and tearful during Triage.  She denies any suicidal ideations.  Patient states that they will be coming back home Sunday night and she would really like to see someone Monday morning if possible. Appointment made for Monday 03/17/2024 at 8:20 am with Grayling Congress. Low[place]Chase DO. Patient is also advised that if she needs immediate help at any point to seek medical attention at an Emergency Room over the weekend. Patient verbalized understanding and is appreciative of getting an appointment for Monday.  Reason for Disposition  Recent traumatic event (e.g., death of a loved one, job loss, victim/witness of crime)  Answer Assessment - Initial Assessment Questions 1.  CONCERN: "Did anything happen that prompted you to call today?"      Stressors in life--children's father is in neuro ICU at [facility] 2. ANXIETY SYMPTOMS: "Can you describe how you (your loved one; patient) have been feeling?" (e.g., tense, restless, panicky, anxious, keyed up, overwhelmed, sense of impending doom).      overwhelmed 3. ONSET: "How long have you been feeling this way?" (e.g., hours, days, weeks)     With this incident 4. SEVERITY: "How would you rate the level of anxiety?" (e.g., 0 - 10; or mild, moderate, severe).     Very severe at this time 5. FUNCTIONAL IMPAIRMENT: "How have these feelings affected your ability to do daily activities?" "Have you had more difficulty than usual doing your normal daily activities?" (e.g., getting better, same, worse; self-care, school, work, interactions)     Having to go to Neuro ICU with his children's father 6. HISTORY: "Have you felt this way before?" "Have you ever been diagnosed with an anxiety problem in the past?" (e.g., generalized anxiety disorder, panic attacks, PTSD). If Yes, ask: "How was this problem treated?" (e.g., medicines, counseling, etc.)     Patient had Ativan years ago 7. RISK OF HARM - SUICIDAL IDEATION: "Do you ever have thoughts of hurting or killing yourself?" If Yes, ask:  "Do you have these feelings now?" "Do you have a plan on how you would do this?"     No 8. TREATMENT:  "What has been done so far to treat this anxiety?" (e.g., medicines, relaxation strategies). "What has helped?"     Acute situation 9. TREATMENT - THERAPIST: "Do you have a counselor or  therapist? Name?"     No 10. POTENTIAL TRIGGERS: "Do you drink caffeinated beverages (e.g., coffee, colas, teas), and how much daily?" "Do you drink alcohol or use any drugs?" "Have you started any new medicines recently?"       N/a 11. PATIENT SUPPORT: "Who is with you now?" "Who do you live with?" "Do you have family or friends who you can talk to?"        Going to  pick up her son now to go to the airport 12. OTHER SYMPTOMS: "Do you have any other symptoms?" (e.g., feeling depressed, trouble concentrating, trouble sleeping, trouble breathing, palpitations or fast heartbeat, chest pain, sweating, nausea, or diarrhea)       Very upset  Protocols used: Anxiety and Panic Attack-A-AH

## 2024-03-17 ENCOUNTER — Ambulatory Visit: Admitting: Family Medicine

## 2024-04-02 ENCOUNTER — Other Ambulatory Visit: Payer: Self-pay

## 2024-04-02 ENCOUNTER — Other Ambulatory Visit: Payer: Self-pay | Admitting: Family Medicine

## 2024-04-02 ENCOUNTER — Other Ambulatory Visit (HOSPITAL_COMMUNITY): Payer: Self-pay

## 2024-04-02 DIAGNOSIS — E782 Mixed hyperlipidemia: Secondary | ICD-10-CM

## 2024-04-02 MED ORDER — ATORVASTATIN CALCIUM 10 MG PO TABS
10.0000 mg | ORAL_TABLET | Freq: Every day | ORAL | 0 refills | Status: DC
  Filled 2024-04-02: qty 90, 90d supply, fill #0

## 2024-04-07 ENCOUNTER — Other Ambulatory Visit (HOSPITAL_COMMUNITY): Payer: Self-pay

## 2024-04-10 ENCOUNTER — Other Ambulatory Visit: Payer: Self-pay

## 2024-04-10 ENCOUNTER — Other Ambulatory Visit (HOSPITAL_COMMUNITY): Payer: Self-pay

## 2024-04-21 ENCOUNTER — Other Ambulatory Visit (HOSPITAL_COMMUNITY): Payer: Self-pay

## 2024-04-29 ENCOUNTER — Telehealth: Payer: Self-pay | Admitting: Family Medicine

## 2024-04-29 NOTE — Telephone Encounter (Signed)
 Copied from CRM [id number]. Topic: Appointments - Appointment Scheduling >> Apr 29, 2024 11:59 AM Sophia H wrote: Patient/patient representative is calling to schedule an appointment. Refer to attachments for appointment information.    Pt was needing to schedule an appointment ASAP, having trouble sleeping due to husbands recent diagnosis of Lukemia, anxiety has been really bad req medication to help out for time being.  Scheduled soonest with PA Heidi Llamas for 05/29, pt states if Dr. Rodrick Clapper is unable to send anything in for her she will keep that appointment and come in.

## 2024-05-01 ENCOUNTER — Ambulatory Visit: Admitting: Physician Assistant

## 2024-05-01 ENCOUNTER — Other Ambulatory Visit (HOSPITAL_BASED_OUTPATIENT_CLINIC_OR_DEPARTMENT_OTHER): Payer: Self-pay

## 2024-05-01 VITALS — BP 134/74 | HR 72 | Temp 98.8°F | Resp 16 | Ht 64.0 in | Wt 130.0 lb

## 2024-05-01 DIAGNOSIS — F4323 Adjustment disorder with mixed anxiety and depressed mood: Secondary | ICD-10-CM | POA: Diagnosis not present

## 2024-05-01 DIAGNOSIS — K120 Recurrent oral aphthae: Secondary | ICD-10-CM

## 2024-05-01 DIAGNOSIS — F4321 Adjustment disorder with depressed mood: Secondary | ICD-10-CM

## 2024-05-01 MED ORDER — LIDOCAINE VISCOUS HCL 2 % MT SOLN
15.0000 mL | Freq: Four times a day (QID) | OROMUCOSAL | 0 refills | Status: AC | PRN
  Filled 2024-05-01: qty 100, 2d supply, fill #0

## 2024-05-01 MED ORDER — BUSPIRONE HCL 5 MG PO TABS
5.0000 mg | ORAL_TABLET | Freq: Three times a day (TID) | ORAL | 3 refills | Status: DC
  Filled 2024-05-01: qty 90, 30d supply, fill #0
  Filled 2024-06-05: qty 90, 30d supply, fill #1
  Filled 2024-07-03: qty 90, 30d supply, fill #2
  Filled 2024-07-30: qty 90, 30d supply, fill #3

## 2024-05-01 NOTE — Progress Notes (Unsigned)
 Established patient visit   Patient: [name] Jensen   DOB: 03-26-1956   68 y.o. Female  MRN: [id number] Visit Date: 05/01/2024  Today's healthcare provider: Trenton Frock, PA-C   Chief Complaint  Patient presents with   Depression    Here for increased depression symptoms   Anxiety    Patient reports increased anxiety symptoms   Subjective     Discussed the use of AI scribe software for clinical note transcription with the patient, who gave verbal consent to proceed.  History of Present Illness   [name] Jensen "[name] Jensen" is a 68 year old female who presents with stress and anxiety related to her former husband's terminal illness.  She experiences significant stress and anxiety due to her former husband's terminal illness, which has led to chest pain, shortness of breath, and panic attacks since his hospitalization. She has a history of depression and anxiety  She is responsible for communicating updates to her family, including her son and daughter, which adds to her emotional burden. She is concerned about the financial implications of her former husband's medical bills on her children and the potential need to support her daughter financially.  She is a cardiology nurse working full-time. Her social support includes a flexible work environment and a supportive family network.       Medications: Outpatient Medications Prior to Visit  Medication Sig   atorvastatin  (LIPITOR) 10 MG tablet Take 1 tablet (10 mg total) by mouth daily.   blood glucose meter kit and supplies KIT Dispense based on patient and insurance preference. Use up to four times daily as directed. (FOR ICD-9 250.00, 250.01).   Cholecalciferol  (VITAMIN D3) 125 MCG (5000 UT) CAPS Take 1 capsule (5,000 Units total) by mouth daily.   clindamycin  (CLEOCIN  T) 1 % external solution Apply to face in the morning   cromolyn  (OPTICROM ) 4 % ophthalmic solution Place 1 drop into both eyes 4 (four) times daily.    Estradiol  (YUVAFEM ) 10 MCG TABS vaginal tablet Place 1 tablet (10 mcg total) vaginally 2 (two) times a week.   Eszopiclone  3 MG TABS Take 1 tablet (3 mg total) by mouth at bedtime. Take immediately before bedtime   ivabradine  (CORLANOR) 5 MG TABS tablet Take 1 tablet (5 mg total) by mouth 2 (two) times daily with a meal.   linaclotide  (LINZESS ) 145 MCG CAPS capsule Take 145 mcg by mouth daily before breakfast.   tirzepatide  (MOUNJARO ) 5 MG/0.5ML Pen Inject 5 mg into the skin once a week.   traZODone  (DESYREL ) 100 MG tablet Take 1 tablet (100 mg total) by mouth at bedtime.   No facility-administered medications prior to visit.    Review of Systems  Constitutional:  Negative for fatigue and fever.  Respiratory:  Negative for cough and shortness of breath.   Cardiovascular:  Negative for chest pain and leg swelling.  Gastrointestinal:  Negative for abdominal pain.  Neurological:  Negative for dizziness and headaches.  Psychiatric/Behavioral:  The patient is nervous/anxious.        Objective    BP 134/74 (BP Location: Right Arm, Patient Position: Sitting, Cuff Size: Small)   Pulse 72   Temp 98.8 F (37.1 C) (Oral)   Resp 16   Ht 5\' 4"  (1.626 m)   Wt 130 lb (59 kg)   LMP  (LMP Unknown)   SpO2 96%   BMI 22.31 kg/m    Physical Exam Vitals reviewed.  Constitutional:      Appearance: She  is not ill-appearing.  HENT:     Head: Normocephalic.  Eyes:     Conjunctiva/sclera: Conjunctivae normal.  Cardiovascular:     Rate and Rhythm: Normal rate.  Pulmonary:     Effort: Pulmonary effort is normal. No respiratory distress.  Neurological:     Mental Status: She is alert and oriented to person, place, and time.  Psychiatric:        Mood and Affect: Mood is anxious. Affect is tearful.        Behavior: Behavior normal.     No results found for any visits on 05/01/24.  Assessment & Plan    Grief -     busPIRone  HCl; Take 1 tablet (5 mg total) by mouth 3 (three) times daily.   Dispense: 90 tablet; Refill: 3  Adjustment disorder with mixed anxiety and depressed mood Assessment & Plan: Anxiety/depression symptoms exacerbated by current life stressors, Buspar  discussed for anxiety management due to its quicker onset compared to SSRIs. Potential side effect of lightheadedness  - Prescribe Buspar  5 mg, start with twice a day dosing, and titrate as needed up to three times a day.  - Consider Ativan  0.5 mg before the family meeting on Monday for acute anxiety management.  Orders: -     busPIRone  HCl; Take 1 tablet (5 mg total) by mouth 3 (three) times daily.  Dispense: 90 tablet; Refill: 3  Aphthous ulcer -     Lidocaine  Viscous HCl; Use as directed 15 mLs in the mouth or throat every 6 (six) hours as needed for mouth pain.  Dispense: 100 mL; Refill: 0   Canker sores Canker sores present on lips and tongue, causing discomfort. Sjogren's syndrome may contribute to oral dryness and canker sores. - Prescribe viscous lidocaine  for topical application to canker sores for pain relief.   General Health Maintenance Encouraged to consider short-term disability or FMLA to manage work stress and focus on personal health during this challenging time.  An hour was spent with the patient today discussing the above.  Return if symptoms worsen or fail to improve.       Trenton Frock, PA-C  [facility] [phone] (phone) [phone] (fax)  [facility]

## 2024-05-01 NOTE — Telephone Encounter (Signed)
 Appointment still scheduled for today,05/01/24.

## 2024-05-02 ENCOUNTER — Encounter: Payer: Self-pay | Admitting: Physician Assistant

## 2024-05-02 DIAGNOSIS — F4323 Adjustment disorder with mixed anxiety and depressed mood: Secondary | ICD-10-CM | POA: Insufficient documentation

## 2024-05-02 NOTE — Assessment & Plan Note (Signed)
 Anxiety/depression symptoms exacerbated by current life stressors, Buspar  discussed for anxiety management due to its quicker onset compared to SSRIs. Potential side effect of lightheadedness  - Prescribe Buspar  5 mg, start with twice a day dosing, and titrate as needed up to three times a day.  - Consider Ativan  0.5 mg before the family meeting on Monday for acute anxiety management.

## 2024-05-11 ENCOUNTER — Other Ambulatory Visit: Payer: Self-pay | Admitting: Family Medicine

## 2024-05-12 ENCOUNTER — Telehealth: Admitting: Physician Assistant

## 2024-05-12 ENCOUNTER — Other Ambulatory Visit (HOSPITAL_COMMUNITY): Payer: Self-pay

## 2024-05-12 ENCOUNTER — Other Ambulatory Visit: Payer: Self-pay | Admitting: Family Medicine

## 2024-05-12 DIAGNOSIS — R3989 Other symptoms and signs involving the genitourinary system: Secondary | ICD-10-CM | POA: Diagnosis not present

## 2024-05-12 MED ORDER — ESZOPICLONE 3 MG PO TABS
3.0000 mg | ORAL_TABLET | Freq: Every day | ORAL | 2 refills | Status: DC
  Filled 2024-05-12 – 2024-05-13 (×2): qty 30, 30d supply, fill #0
  Filled 2024-06-07 – 2024-06-12 (×4): qty 30, 30d supply, fill #1
  Filled 2024-07-10: qty 30, 30d supply, fill #2

## 2024-05-12 MED ORDER — SULFAMETHOXAZOLE-TRIMETHOPRIM 800-160 MG PO TABS: 1.0000 | ORAL_TABLET | Freq: Two times a day (BID) | ORAL | 0 refills | Status: DC

## 2024-05-12 NOTE — Telephone Encounter (Signed)
 Requesting: Eszopicilone 3mg   Contract: 01/31/2024 UDS: 01/31/2024 Last Visit: 01/31/2024 Next Visit: N/A Last Refill: 01/16/2024  Please Advise

## 2024-05-12 NOTE — Progress Notes (Signed)
 E-Visit for Urinary Problems  We are sorry that you are not feeling well.  Here is how we plan to help!  Based on what you shared with me it looks like you most likely have a simple urinary tract infection.  A UTI (Urinary Tract Infection) is a bacterial infection of the bladder.  Most cases of urinary tract infections are simple to treat but a key part of your care is to encourage you to drink plenty of fluids and watch your symptoms carefully.  I have prescribed Bactrim DS One tablet twice a day for 5 days.  Your symptoms should gradually improve. Call us if the burning in your urine worsens, you develop worsening fever, back pain or pelvic pain or if your symptoms do not resolve after completing the antibiotic.  Urinary tract infections can be prevented by drinking plenty of water to keep your body hydrated.  Also be sure when you wipe, wipe from front to back and don't hold it in!  If possible, empty your bladder every 4 hours.  HOME CARE Drink plenty of fluids Compete the full course of the antibiotics even if the symptoms resolve Remember, when you need to go.go. Holding in your urine can increase the likelihood of getting a UTI! GET HELP RIGHT AWAY IF: You cannot urinate You get a high fever Worsening back pain occurs You see blood in your urine You feel sick to your stomach or throw up You feel like you are going to pass out  MAKE SURE YOU  Understand these instructions. Will watch your condition. Will get help right away if you are not doing well or get worse.   Thank you for choosing an e-visit.  Your e-visit answers were reviewed by a board certified advanced clinical practitioner to complete your personal care plan. Depending upon the condition, your plan could have included both over the counter or prescription medications.  Please review your pharmacy choice. Make sure the pharmacy is open so you can pick up prescription now. If there is a problem, you may contact  your provider through [employer] and have the prescription routed to another pharmacy.  Your safety is important to [place]. If you have drug allergies check your prescription carefully.   For the next 24 hours you can use MyChart to ask questions about today's visit, request a non-urgent call back, or ask for a work or school excuse. You will get an email in the next two days asking about your experience. I hope that your e-visit has been valuable and will speed your recovery.    I have spent 5 minutes in review of e-visit questionnaire, review and updating patient chart, medical decision making and response to patient.   Margaretann Loveless, PA-C

## 2024-05-13 ENCOUNTER — Encounter: Payer: Self-pay | Admitting: Family Medicine

## 2024-05-13 ENCOUNTER — Other Ambulatory Visit: Payer: Self-pay

## 2024-05-13 ENCOUNTER — Other Ambulatory Visit (HOSPITAL_COMMUNITY): Payer: Self-pay

## 2024-05-13 ENCOUNTER — Telehealth: Payer: Self-pay

## 2024-05-13 MED ORDER — MOUNJARO 5 MG/0.5ML ~~LOC~~ SOAJ
5.0000 mg | SUBCUTANEOUS | 0 refills | Status: DC
  Filled 2024-05-13: qty 2, 28d supply, fill #0
  Filled 2024-06-05: qty 2, 28d supply, fill #1
  Filled 2024-07-03: qty 2, 28d supply, fill #2

## 2024-05-13 NOTE — Telephone Encounter (Signed)
 Ozempic /Mounjaro  is approved exclusively as an adjunct to diet and exercise to improve glycemic  control in adults with type 2 diabetes mellitus. A review of patient's medical chart reveals no  documented diagnosis of type 2 diabetes or an A1C indicative of diabetes. Therefore, they do not  currently meet the criteria for prior authorization of this medication. If clinically appropriate, alternative  options such as Saxenda , Zepbound , or Wegovy  may be considered for this patient.   Patient A1C from 01/31/24 5.5. Per test billing patient has plan benefit exclusion for Zepbound  or Wegovy . Please advise

## 2024-05-15 ENCOUNTER — Other Ambulatory Visit (HOSPITAL_COMMUNITY): Payer: Self-pay

## 2024-06-05 ENCOUNTER — Other Ambulatory Visit (HOSPITAL_COMMUNITY): Payer: Self-pay

## 2024-06-07 ENCOUNTER — Other Ambulatory Visit (HOSPITAL_COMMUNITY): Payer: Self-pay

## 2024-06-09 ENCOUNTER — Other Ambulatory Visit (HOSPITAL_COMMUNITY): Payer: Self-pay

## 2024-06-10 ENCOUNTER — Other Ambulatory Visit (HOSPITAL_COMMUNITY): Payer: Self-pay

## 2024-06-10 ENCOUNTER — Other Ambulatory Visit: Payer: Self-pay

## 2024-06-12 ENCOUNTER — Other Ambulatory Visit: Payer: Self-pay

## 2024-06-14 ENCOUNTER — Other Ambulatory Visit (HOSPITAL_COMMUNITY): Payer: Self-pay

## 2024-07-03 ENCOUNTER — Other Ambulatory Visit (HOSPITAL_COMMUNITY): Payer: Self-pay

## 2024-07-03 ENCOUNTER — Other Ambulatory Visit: Payer: Self-pay

## 2024-07-03 ENCOUNTER — Other Ambulatory Visit: Payer: Self-pay | Admitting: Family Medicine

## 2024-07-03 DIAGNOSIS — E782 Mixed hyperlipidemia: Secondary | ICD-10-CM

## 2024-07-03 MED ORDER — ATORVASTATIN CALCIUM 10 MG PO TABS
10.0000 mg | ORAL_TABLET | Freq: Every day | ORAL | 0 refills | Status: DC
  Filled 2024-07-03: qty 90, 90d supply, fill #0

## 2024-07-11 ENCOUNTER — Other Ambulatory Visit (HOSPITAL_COMMUNITY): Payer: Self-pay

## 2024-07-11 ENCOUNTER — Other Ambulatory Visit: Payer: Self-pay

## 2024-07-30 ENCOUNTER — Other Ambulatory Visit (HOSPITAL_COMMUNITY): Payer: Self-pay

## 2024-07-30 ENCOUNTER — Other Ambulatory Visit (HOSPITAL_COMMUNITY)
Admission: RE | Admit: 2024-07-30 | Discharge: 2024-07-30 | Disposition: A | Discharge status: Home / Self Care | Payer: Self-pay | Source: Ambulatory Visit | Attending: Oncology | Admitting: Oncology

## 2024-07-30 ENCOUNTER — Other Ambulatory Visit: Payer: Self-pay

## 2024-07-30 ENCOUNTER — Other Ambulatory Visit: Payer: Self-pay | Admitting: Family Medicine

## 2024-07-30 ENCOUNTER — Other Ambulatory Visit: Payer: Self-pay | Admitting: Medical Genetics

## 2024-07-30 DIAGNOSIS — Z006 Encounter for examination for normal comparison and control in clinical research program: Secondary | ICD-10-CM | POA: Insufficient documentation

## 2024-07-30 MED ORDER — MOUNJARO 5 MG/0.5ML ~~LOC~~ SOAJ
5.0000 mg | SUBCUTANEOUS | 0 refills | Status: DC
  Filled 2024-07-30: qty 2, 28d supply, fill #0

## 2024-08-04 ENCOUNTER — Other Ambulatory Visit: Payer: Self-pay | Admitting: Family Medicine

## 2024-08-04 ENCOUNTER — Other Ambulatory Visit: Payer: Self-pay | Admitting: Family

## 2024-08-05 ENCOUNTER — Other Ambulatory Visit: Payer: Self-pay

## 2024-08-05 ENCOUNTER — Other Ambulatory Visit (HOSPITAL_COMMUNITY): Payer: Self-pay

## 2024-08-05 LAB — GENECONNECT MOLECULAR SCREEN: Genetic Analysis Overall Interpretation: NEGATIVE

## 2024-08-05 MED ORDER — TRAZODONE HCL 100 MG PO TABS
100.0000 mg | ORAL_TABLET | Freq: Every day | ORAL | 1 refills | Status: AC
  Filled 2024-08-05: qty 90, 90d supply, fill #0
  Filled 2024-11-04: qty 90, 90d supply, fill #1

## 2024-08-06 ENCOUNTER — Other Ambulatory Visit: Payer: Self-pay

## 2024-08-07 ENCOUNTER — Other Ambulatory Visit: Payer: Self-pay

## 2024-08-07 ENCOUNTER — Encounter: Payer: Self-pay | Admitting: Family Medicine

## 2024-08-07 ENCOUNTER — Other Ambulatory Visit (HOSPITAL_COMMUNITY): Payer: Self-pay

## 2024-08-07 ENCOUNTER — Other Ambulatory Visit: Payer: Self-pay | Admitting: Family Medicine

## 2024-08-07 MED ORDER — ESZOPICLONE 3 MG PO TABS
3.0000 mg | ORAL_TABLET | Freq: Every day | ORAL | 2 refills | Status: DC
  Filled 2024-08-07 – 2024-08-09 (×2): qty 30, 30d supply, fill #0
  Filled 2024-09-09 (×2): qty 30, 30d supply, fill #1
  Filled 2024-10-09: qty 30, 30d supply, fill #2

## 2024-08-07 NOTE — Telephone Encounter (Signed)
 Requesting: eszopiclone  3mg   Contract: 01/31/24 UDS:  01/31/24 Last Visit: 05/01/24 w/ Manuelita Next Visit: None Last Refill: 05/12/24 #30 and 0RF   Please Advise

## 2024-08-09 ENCOUNTER — Other Ambulatory Visit (HOSPITAL_COMMUNITY): Payer: Self-pay

## 2024-08-28 ENCOUNTER — Encounter: Payer: Self-pay | Admitting: Family Medicine

## 2024-08-28 ENCOUNTER — Ambulatory Visit: Admitting: Family Medicine

## 2024-08-28 ENCOUNTER — Other Ambulatory Visit (HOSPITAL_BASED_OUTPATIENT_CLINIC_OR_DEPARTMENT_OTHER): Payer: Self-pay

## 2024-08-28 VITALS — BP 142/70 | HR 76 | Temp 98.4°F | Resp 18 | Ht 64.0 in | Wt 135.4 lb

## 2024-08-28 DIAGNOSIS — E559 Vitamin D deficiency, unspecified: Secondary | ICD-10-CM

## 2024-08-28 DIAGNOSIS — E88819 Insulin resistance, unspecified: Secondary | ICD-10-CM | POA: Diagnosis not present

## 2024-08-28 DIAGNOSIS — J0140 Acute pansinusitis, unspecified: Secondary | ICD-10-CM | POA: Diagnosis not present

## 2024-08-28 DIAGNOSIS — E782 Mixed hyperlipidemia: Secondary | ICD-10-CM

## 2024-08-28 DIAGNOSIS — M791 Myalgia, unspecified site: Secondary | ICD-10-CM

## 2024-08-28 DIAGNOSIS — R300 Dysuria: Secondary | ICD-10-CM | POA: Diagnosis not present

## 2024-08-28 LAB — POC URINALSYSI DIPSTICK (AUTOMATED)
Bilirubin, UA: NEGATIVE
Blood, UA: NEGATIVE
Glucose, UA: NEGATIVE
Ketones, UA: NEGATIVE
Leukocytes, UA: NEGATIVE
Nitrite, UA: NEGATIVE
Protein, UA: NEGATIVE
Spec Grav, UA: <=1.005 — AB (ref 1.010–1.025)
Urobilinogen, UA: 0.2 U/dL
pH, UA: 5 (ref 5.0–8.0)

## 2024-08-28 MED ORDER — SULFAMETHOXAZOLE-TRIMETHOPRIM 800-160 MG PO TABS
1.0000 | ORAL_TABLET | Freq: Two times a day (BID) | ORAL | 0 refills | Status: DC
  Filled 2024-08-28: qty 20, 10d supply, fill #0

## 2024-08-28 NOTE — Assessment & Plan Note (Signed)
 Recheck today

## 2024-08-28 NOTE — Patient Instructions (Signed)
 Muscle and Joint Pain: What It Means Muscle and joint pain is aches and pains in your bones, joints, muscles, and tissues that surround them. This pain can happen in any part of the body. It can last for a short time or a long time. To find the cause of the pain, you may have: A physical exam. Lab tests. Imaging tests. Follow these instructions at home: Medicines Take your medicines only as told. Treatment may include medicines for pain and inflammation. You might need to take medicines by mouth or put them on your skin. Managing pain, stiffness, and swelling     Use ice or an ice pack as told. Place a towel between your skin and the ice. Leave the ice on for 20 minutes, 2-3 times a day. Use heat as told. Use the heat source that your health care provider recommends, such as a moist heat pack or a heating pad. Do this as often as told. Place a towel between your skin and the heat source. Leave the heat on for 20-30 minutes. If your skin turns red, take off the ice or heat right away to prevent burns. The risk of burns is higher if you can't feel pain, heat, or cold. Activity When your pain is very bad, bed rest may be helpful. Lie or sit in any position that's comfortable. Get up to take short walks at least every 2 hours during the day. This helps you breathe better and keeps your blood flowing. Ask for help if you feel weak or unsteady. You may continue doing your usual activities unless the activities cause more pain. When the pain gets better, slowly increase the time and intensity of your activities or exercise. Lifestyle Learn to manage anxiety and stress. Stress increases muscle tension and can make pain worse. Ask your provider if you should try: Meditation or yoga. Therapy. Acupuncture or massage. General instructions Your provider may tell you to see a physical therapist to help you do exercises to improve movement and strength in the affected area. Exercise as  told. Contact a health care provider if: Your pain gets worse. Medicines do not help your pain. You can't use the part of your body that hurts, such as your arm, leg, or neck. You have trouble sleeping. You have trouble doing your usual activities. Get help right away if: You have a new injury and your pain is worse or different. You feel numb or you have tingling in the painful area. This information is not intended to replace advice given to you by your health care provider. Make sure you discuss any questions you have with your health care provider. Document Revised: 04/20/2023 Document Reviewed: 04/04/2023 Elsevier Patient Education  2024 [employer].

## 2024-08-28 NOTE — Progress Notes (Signed)
 +  Subjective:    Patient ID: [name] Jensen, female    DOB: [birth year], 68 y.o.   MRN: [id number]  Chief Complaint  Patient presents with   Sinus Problem    X2 weeks, pt believes having upper respiratory infection. Pt states COVID negative last week.     HPI Patient is in today for multiple issues.   Discussed the use of AI scribe software for clinical note transcription with the patient, who gave verbal consent to proceed.  History of Present Illness [name] Jensen is a 68 year old female who presents with systemic joint and muscle pain.  She has been experiencing systemic joint and muscle pain affecting her knees, elbows, shoulders, and hips for about a year, with worsening symptoms over the last month. She reports that the pain is particularly bad in the mornings and significantly impacts her ability to walk her dogs and assist her sister with horses. She has a history of ehrlichiosis 11 years ago, which also caused joint pain and high fevers, but she currently has no fever.  She mentions a possible diagnosis of Sjgren's syndrome suggested by her dentist due to dry skin and eyes. She uses Syst[employer] for her eyes and has been informed about SSA antibodies, although she has not been officially diagnosed. Previous tests including ANAs, ESRs, and CRPs have been negative.  She has experienced symptoms of a urinary tract infection with sharp urethral pain and burning, described as a spasm. She drinks eight bottles of water  a day and has a history of a failed bladder sling surgery. She has not used Uristat or pyridium. She was prescribed estrogen tablets for postmenopausal symptoms but finds them painful to use.  She recently had an upper respiratory infection and has been taking Sudafed and Mucinex, which have improved her symptoms. She reports a metallic taste in her mouth, which she associates with the sinus infection.  She is currently taking Mounjaro   for type 2 diabetes, which has helped control her fasting insulin  levels. She has been taking it every other week but has noticed weight gain recently. She stopped taking atorvastatin  two months ago due to concerns about muscle aches, but it did not alleviate her symptoms.   Past Medical History:  Diagnosis Date   Adjustment reaction with anxiety and depression 06/12/2014   Anemia    h/o low ferritin   Anxiety    Asthma    environmental triggers   Chicken pox as a child   Cough 08/22/2015   Depression    Depression with anxiety 06/12/2014   Diverticulosis    Ehrlichiosis 2014   Gallstones    GERD (gastroesophageal reflux disease)    Hair loss 03/28/2017   Hiatal hernia 2007   History of viral illness    Hyperlipidemia, mixed 04/11/2015   Insomnia 08/21/2017   Lactose intolerance 06/12/2014   Mumps as a child   Overweight 08/19/2014   Rectocele 06/12/2014   RLS (restless legs syndrome)    SCC (squamous cell carcinoma) 06/12/2014   Right arm removed 2011   Sessile colonic polyp    SVT (supraventricular tachycardia)    a. ablation in 05/2006   Tachycardia 06/12/2014   Thyroid  disease    Vitamin D  deficiency 08/22/2015    Past Surgical History:  Procedure Laterality Date   ABDOMINAL HYSTERECTOMY  11/03/2008   total with cystocele, rectocele repair   APPENDECTOMY     CHOLECYSTECTOMY  COLONOSCOPY  12/04/2004   diverticulosis, hemorrhoids ([facility])   ESOPHAGOGASTRODUODENOSCOPY  12/04/2004   small hiatus hernia ([facility])   ESOPHAGOGASTRODUODENOSCOPY N/A 02/21/2018   Procedure: ESOPHAGOGASTRODUODENOSCOPY (EGD);  Surgeon: Golda Claudis PENNER, MD;  Location: [facility];  Service: Endoscopy;  Laterality: N/A;  pt knows to arrive at 2:45   INCONTINENCE SURGERY     SKIN SURGERY     right arm scc   svt ablation     TONSILLECTOMY  12/04/1970   2ith adenoid   WISDOM TOOTH EXTRACTION  68 yrs old    Family History  Problem Relation Age of Onset   Hypertension Mother     Hypertension Father    Hypertension Sister    Diabetes Sister        type 2   Fibromyalgia Sister    Multiple sclerosis Sister    Proteinuria Sister    GI Bleed Maternal Grandmother    Atrial fibrillation Maternal Grandmother    Heart disease Maternal Grandfather    Diabetes Paternal Grandmother    Stroke Paternal Grandfather    Colon cancer Neg Hx    Esophageal cancer Neg Hx    Rectal cancer Neg Hx    Stomach cancer Neg Hx     Social History   Socioeconomic History   Marital status: Divorced    Spouse name: Not on file   Number of children: 2   Years of education: Not on file   Highest education level: Not on file  Occupational History   Occupation: Teacher, adult education: [employer]  Tobacco Use   Smoking status: Never   Smokeless tobacco: Never  Vaping Use   Vaping status: Never Used  Substance and Sexual Activity   Alcohol use: No    Alcohol/week: 0.0 standard drinks of alcohol   Drug use: No   Sexual activity: Not Currently    Partners: Male    Comment: lives by self with 4 dogs works at [employer], avoids dairy  Other Topics Concern   Not on file  Social History Narrative   Not on file   Social Drivers of Health   Financial Resource Strain: Patient Declined (08/21/2024)   Overall Financial Resource Strain (CARDIA)    Difficulty of Paying Living Expenses: Patient declined  Food Insecurity: Patient Declined (08/21/2024)   Hunger Vital Sign    Worried About Running Out of Food in the Last Year: Patient declined    Ran Out of Food in the Last Year: Patient declined  Transportation Needs: Patient Declined (08/21/2024)   PRAPARE - Administrator, Civil Service (Medical): Patient declined    Lack of Transportation (Non-Medical): Patient declined  Physical Activity: Unknown (08/21/2024)   Exercise Vital Sign    Days of Exercise per Week: Patient declined    Minutes of Exercise per Session: Not on file  Stress: Patient Declined (08/21/2024)   [employer] Occupational [employer] - Occupational Stress Questionnaire    Feeling of Stress: Patient declined  Social Connections: Unknown (08/21/2024)   Social Connection and Isolation Panel    Frequency of Communication with Friends and Family: Patient declined    Frequency of Social Gatherings with Friends and Family: Patient declined    Attends Religious Services: Not on Insurance claims handler of Clubs or Organizations: Patient declined    Attends Banker Meetings: Not on file    Marital Status: Patient declined  Intimate Partner Violence: Not on file    Outpatient  Medications Prior to Visit  Medication Sig Dispense Refill   atorvastatin  (LIPITOR) 10 MG tablet Take 1 tablet (10 mg total) by mouth daily. 90 tablet 0   blood glucose meter kit and supplies KIT Dispense based on patient and insurance preference. Use up to four times daily as directed. (FOR ICD-9 250.00, 250.01). 1 each 0   busPIRone  (BUSPAR ) 5 MG tablet Take 1 tablet (5 mg total) by mouth 3 (three) times daily. 90 tablet 3   Cholecalciferol  (VITAMIN D3) 125 MCG (5000 UT) CAPS Take 1 capsule (5,000 Units total) by mouth daily. 90 capsule 1   clindamycin  (CLEOCIN  T) 1 % external solution Apply to face in the morning 60 mL 2   cromolyn  (OPTICROM ) 4 % ophthalmic solution Place 1 drop into both eyes 4 (four) times daily. 10 mL 11   Estradiol  (YUVAFEM ) 10 MCG TABS vaginal tablet Place 1 tablet (10 mcg total) vaginally 2 (two) times a week. 24 tablet 3   Eszopiclone  3 MG TABS Take 1 tablet (3 mg total) by mouth at bedtime. Take immediately before bedtime 30 tablet 2   ivabradine  (CORLANOR) 5 MG TABS tablet Take 1 tablet (5 mg total) by mouth 2 (two) times daily with a meal. 60 tablet 11   lidocaine  (XYLOCAINE ) 2 % solution Use as directed 15 mLs in the mouth or throat every 6 (six) hours as needed for mouth pain. 100 mL 0   linaclotide  (LINZESS ) 145 MCG CAPS capsule Take 145 mcg by mouth daily before breakfast.      tirzepatide  (MOUNJARO ) 5 MG/0.5ML Pen Inject 5 mg into the skin once a week. Needs appt 2 mL 0   traZODone  (DESYREL ) 100 MG tablet Take 1 tablet (100 mg total) by mouth at bedtime. 90 tablet 1   sulfamethoxazole -trimethoprim  (BACTRIM  DS) 800-160 MG tablet Take 1 tablet by mouth 2 (two) times daily. 10 tablet 0   No facility-administered medications prior to visit.    Allergies  Allergen Reactions   Dilaudid [Hydromorphone Hcl] Other (See Comments)    Respiratory , cardiac     Review of Systems  Constitutional:  Positive for malaise/fatigue. Negative for fever.  HENT:  Positive for congestion and sinus pain.   Eyes:  Negative for blurred vision.  Respiratory:  Negative for shortness of breath.   Cardiovascular:  Negative for chest pain, palpitations and leg swelling.  Gastrointestinal:  Negative for abdominal pain, blood in stool and nausea.  Genitourinary:  Positive for dysuria and frequency.  Musculoskeletal:  Positive for myalgias. Negative for falls.  Skin:  Negative for rash.  Neurological:  Negative for dizziness, loss of consciousness and headaches.  Endo/Heme/Allergies:  Negative for environmental allergies.  Psychiatric/Behavioral:  Negative for depression. The patient is not nervous/anxious.        Objective:    Physical Exam Vitals and nursing note reviewed.  Constitutional:      General: She is not in acute distress.    Appearance: Normal appearance. She is well-developed.  HENT:     Head: Normocephalic and atraumatic.     Nose:     Right Sinus: Maxillary sinus tenderness and frontal sinus tenderness present.     Left Sinus: Maxillary sinus tenderness and frontal sinus tenderness present.  Eyes:     General: No scleral icterus.       Right eye: No discharge.        Left eye: No discharge.  Cardiovascular:     Rate and Rhythm: Normal rate and regular rhythm.  Heart sounds: No murmur heard. Pulmonary:     Effort: Pulmonary effort is normal. No respiratory  distress.     Breath sounds: Normal breath sounds.  Musculoskeletal:        General: Normal range of motion.     Cervical back: Normal range of motion and neck supple.     Right lower leg: No edema.     Left lower leg: No edema.  Skin:    General: Skin is warm and dry.  Neurological:     Mental Status: She is alert and oriented to person, place, and time.  Psychiatric:        Mood and Affect: Mood normal.        Behavior: Behavior normal.        Thought Content: Thought content normal.        Judgment: Judgment normal.     BP (!) 142/70 (BP Location: Right Arm, Patient Position: Sitting, Cuff Size: Normal)   Pulse 76   Temp 98.4 F (36.9 C) (Oral)   Resp 18   Ht 5' 4 (1.626 m)   Wt 135 lb 6.4 oz (61.4 kg)   LMP  (LMP Unknown)   SpO2 96%   BMI 23.24 kg/m  Wt Readings from Last 3 Encounters:  08/28/24 135 lb 6.4 oz (61.4 kg)  05/01/24 130 lb (59 kg)  01/31/24 138 lb (62.6 kg)    Diabetic Foot Exam - Simple   No data filed    Lab Results  Component Value Date   WBC 7.2 01/31/2024   HGB 13.3 01/31/2024   HCT 39.1 01/31/2024   PLT 325.0 01/31/2024   GLUCOSE 88 01/31/2024   CHOL 160 01/31/2024   TRIG 97.0 01/31/2024   HDL 65.90 01/31/2024   LDLDIRECT 72.0 04/26/2020   LDLCALC 75 01/31/2024   ALT 20 01/31/2024   AST 17 01/31/2024   NA 139 01/31/2024   K 4.2 01/31/2024   CL 103 01/31/2024   CREATININE 0.79 01/31/2024   BUN 15 01/31/2024   CO2 29 01/31/2024   TSH 1.46 01/31/2024   INR 1.0 11/09/2008   HGBA1C 5.5 01/31/2024   MICROALBUR 1.6 01/31/2024    Lab Results  Component Value Date   TSH 1.46 01/31/2024   Lab Results  Component Value Date   WBC 7.2 01/31/2024   HGB 13.3 01/31/2024   HCT 39.1 01/31/2024   MCV 84.2 01/31/2024   PLT 325.0 01/31/2024   Lab Results  Component Value Date   NA 139 01/31/2024   K 4.2 01/31/2024   CO2 29 01/31/2024   GLUCOSE 88 01/31/2024   BUN 15 01/31/2024   CREATININE 0.79 01/31/2024   BILITOT 0.8  01/31/2024   ALKPHOS 71 01/31/2024   AST 17 01/31/2024   ALT 20 01/31/2024   PROT 7.0 01/31/2024   ALBUMIN 4.3 01/31/2024   CALCIUM  9.3 01/31/2024   ANIONGAP 7 08/17/2021   GFR 78.45 01/31/2024   Lab Results  Component Value Date   CHOL 160 01/31/2024   Lab Results  Component Value Date   HDL 65.90 01/31/2024   Lab Results  Component Value Date   LDLCALC 75 01/31/2024   Lab Results  Component Value Date   TRIG 97.0 01/31/2024   Lab Results  Component Value Date   CHOLHDL 2 01/31/2024   Lab Results  Component Value Date   HGBA1C 5.5 01/31/2024       Assessment & Plan:  Myalgia -     Sjogrens syndrome-A extractable nuclear  antibody -     Sjogrens syndrome-B extractable nuclear antibody -     CBC with Differential/Platelet -     Comprehensive metabolic panel with GFR -     Sedimentation rate -     Rheumatoid factor -     TSH -     ANA -     VITAMIN D  25 Hydroxy (Vit-D Deficiency, Fractures) -     Vitamin B12  Dysuria -     POCT Urinalysis Dipstick (Automated) -     Sulfamethoxazole -Trimethoprim ; Take 1 tablet by mouth 2 (two) times daily.  Dispense: 20 tablet; Refill: 0  Insulin  resistance -     Insulin , random -     Hemoglobin A1c  Hyperlipidemia, mixed -     Lipid panel  Vitamin D  deficiency Assessment & Plan: Recheck today   Acute non-recurrent pansinusitis -     Sulfamethoxazole -Trimethoprim ; Take 1 tablet by mouth 2 (two) times daily.  Dispense: 20 tablet; Refill: 0   Assessment and Plan Assessment & Plan Generalized joint and muscle pain   Chronic joint and muscle pain affects her knees, elbows, shoulders, and hips, with increased severity over the past year. Previous labs, including ANA, ESR, and CRP, were negative. Autoimmune conditions like Sjogren's syndrome are considered due to dry eyes and mouth, but no definitive diagnosis is made. Stress and physical exertion may contribute. She has a history of Hashimoto's and previous ehrlichiosis  with similar symptoms. Order blood work to check for inflammatory markers and other potential causes. Consider referral to a rheumatologist if blood work is inconclusive.  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 A previous UTI occurred in April or May. Estrogen tablets were prescribed but are difficult to use due to pain. Discussed the potential benefit of estrogen cream for local application to alleviate symptoms. Pain occurs with insertion of estrogen tablets. Check urine for infection. Prescribe an antibiotic to cover both sinus and urinary symptoms. Advise contacting a gynecologist to switch from estrogen tablets to cream for easier application.  Acute sinusitis   Recent upper respiratory symptoms involve the sinuses, with a metallic taste in the mouth likely due to sinus drainage. She is currently taking Sudafed and Mucinex with improvement. Prescribe an antibiotic to cover sinus symptoms. Continue Sudafed and Mucinex as needed.  Type 2 diabetes mellitus   She has type 2 diabetes with previously elevated fasting insulin  levels. Currently on Mounjaro , her insulin  levels are well-controlled. Recent stress and dietary changes may affect weight and glucose control. A1c has not been checked recently. Continue Mounjaro . Monitor blood glucose levels and consider checking A1c.  Hyperlipidemia   She has hyperlipidemia and was previously on atorvastatin . She stopped atorvastatin  two months ago due to concerns about muscle pain, but no improvement in symptoms was noted. Consider restarting atorvastatin  if muscle pain is not related to the medication.   Cloteal Isaacson R Lowne Chase, DO

## 2024-08-29 ENCOUNTER — Other Ambulatory Visit: Payer: Self-pay | Admitting: Family Medicine

## 2024-08-29 ENCOUNTER — Encounter: Payer: Self-pay | Admitting: Family Medicine

## 2024-08-29 ENCOUNTER — Other Ambulatory Visit: Payer: Self-pay

## 2024-08-29 ENCOUNTER — Other Ambulatory Visit (HOSPITAL_COMMUNITY): Payer: Self-pay

## 2024-08-29 DIAGNOSIS — R3989 Other symptoms and signs involving the genitourinary system: Secondary | ICD-10-CM

## 2024-08-29 LAB — COMPREHENSIVE METABOLIC PANEL WITH GFR
ALT: 32 U/L (ref 0–35)
AST: 16 U/L (ref 0–37)
Albumin: 4.6 g/dL (ref 3.5–5.2)
Alkaline Phosphatase: 89 U/L (ref 39–117)
BUN: 12 mg/dL (ref 6–23)
CO2: 28 meq/L (ref 19–32)
Calcium: 9.5 mg/dL (ref 8.4–10.5)
Chloride: 101 meq/L (ref 96–112)
Creatinine, Ser: 0.75 mg/dL (ref 0.40–1.20)
GFR: 83.16 mL/min (ref >60.00)
Glucose, Bld: 91 mg/dL (ref 70–99)
Potassium: 4 meq/L (ref 3.5–5.1)
Sodium: 138 meq/L (ref 135–145)
Total Bilirubin: 0.7 mg/dL (ref 0.2–1.2)
Total Protein: 7.3 g/dL (ref 6.0–8.3)

## 2024-08-29 LAB — CBC WITH DIFFERENTIAL/PLATELET
Basophils Absolute: 0 K/uL (ref 0.0–0.1)
Basophils Relative: 0.7 % (ref 0.0–3.0)
Eosinophils Absolute: 0.1 K/uL (ref 0.0–0.7)
Eosinophils Relative: 1.1 % (ref 0.0–5.0)
HCT: 39.7 % (ref 36.0–46.0)
Hemoglobin: 13.3 g/dL (ref 12.0–15.0)
Lymphocytes Relative: 39.9 % (ref 12.0–46.0)
Lymphs Abs: 2.7 K/uL (ref 0.7–4.0)
MCHC: 33.5 g/dL (ref 30.0–36.0)
MCV: 83.1 fl (ref 78.0–100.0)
Monocytes Absolute: 0.3 K/uL (ref 0.1–1.0)
Monocytes Relative: 5 % (ref 3.0–12.0)
Neutro Abs: 3.6 K/uL (ref 1.4–7.7)
Neutrophils Relative %: 53.3 % (ref 43.0–77.0)
Platelets: 327 K/uL (ref 150.0–400.0)
RBC: 4.78 Mil/uL (ref 3.87–5.11)
RDW: 13.5 % (ref 11.5–15.5)
WBC: 6.7 K/uL (ref 4.0–10.5)

## 2024-08-29 LAB — LIPID PANEL
Cholesterol: 218 mg/dL — ABNORMAL HIGH (ref 0–200)
HDL: 66.6 mg/dL (ref >39.00)
LDL Cholesterol: 128 mg/dL — ABNORMAL HIGH (ref 0–99)
NonHDL: 151.04
Total CHOL/HDL Ratio: 3
Triglycerides: 114 mg/dL (ref 0.0–149.0)
VLDL: 22.8 mg/dL (ref 0.0–40.0)

## 2024-08-29 LAB — VITAMIN B12: Vitamin B-12: 371 pg/mL (ref 211–911)

## 2024-08-29 LAB — TSH: TSH: 0.91 u[IU]/mL (ref 0.35–5.50)

## 2024-08-29 LAB — SEDIMENTATION RATE: Sed Rate: 4 mm/h (ref 0–30)

## 2024-08-29 LAB — HEMOGLOBIN A1C: Hgb A1c MFr Bld: 5.9 % (ref 4.6–6.5)

## 2024-08-29 LAB — VITAMIN D 25 HYDROXY (VIT D DEFICIENCY, FRACTURES): VITD: 74.96 ng/mL (ref 30.00–100.00)

## 2024-08-29 MED ORDER — CEFDINIR 300 MG PO CAPS
300.0000 mg | ORAL_CAPSULE | Freq: Two times a day (BID) | ORAL | 0 refills | Status: DC
  Filled 2024-08-29: qty 14, 7d supply, fill #0

## 2024-08-31 ENCOUNTER — Ambulatory Visit: Payer: Self-pay | Admitting: Family Medicine

## 2024-08-31 LAB — SJOGRENS SYNDROME-A EXTRACTABLE NUCLEAR ANTIBODY: SSA (Ro) (ENA) Antibody, IgG: <1 AI

## 2024-08-31 LAB — INSULIN, RANDOM: Insulin: 23.8 u[IU]/mL — ABNORMAL HIGH

## 2024-08-31 LAB — RHEUMATOID FACTOR: Rheumatoid fact SerPl-aCnc: <10 [IU]/mL (ref <14)

## 2024-08-31 LAB — ANA: Anti Nuclear Antibody (ANA): NEGATIVE

## 2024-08-31 LAB — SJOGRENS SYNDROME-B EXTRACTABLE NUCLEAR ANTIBODY: SSB (La) (ENA) Antibody, IgG: <1 AI

## 2024-09-01 ENCOUNTER — Other Ambulatory Visit (HOSPITAL_BASED_OUTPATIENT_CLINIC_OR_DEPARTMENT_OTHER): Payer: Self-pay

## 2024-09-05 ENCOUNTER — Other Ambulatory Visit (HOSPITAL_BASED_OUTPATIENT_CLINIC_OR_DEPARTMENT_OTHER): Payer: Self-pay

## 2024-09-09 ENCOUNTER — Other Ambulatory Visit (HOSPITAL_COMMUNITY): Payer: Self-pay

## 2024-09-09 ENCOUNTER — Other Ambulatory Visit: Payer: Self-pay

## 2024-09-10 ENCOUNTER — Other Ambulatory Visit: Payer: Self-pay

## 2024-09-11 ENCOUNTER — Other Ambulatory Visit (HOSPITAL_COMMUNITY): Payer: Self-pay

## 2024-09-16 ENCOUNTER — Other Ambulatory Visit (HOSPITAL_COMMUNITY): Payer: Self-pay

## 2024-09-17 ENCOUNTER — Encounter (INDEPENDENT_AMBULATORY_CARE_PROVIDER_SITE_OTHER): Payer: Self-pay | Admitting: Gastroenterology

## 2024-09-20 ENCOUNTER — Other Ambulatory Visit (HOSPITAL_COMMUNITY): Payer: Self-pay

## 2024-09-20 ENCOUNTER — Other Ambulatory Visit: Payer: Self-pay | Admitting: Family Medicine

## 2024-09-22 ENCOUNTER — Other Ambulatory Visit: Payer: Self-pay

## 2024-09-22 ENCOUNTER — Other Ambulatory Visit (HOSPITAL_COMMUNITY): Payer: Self-pay

## 2024-09-22 MED ORDER — MOUNJARO 5 MG/0.5ML ~~LOC~~ SOAJ
5.0000 mg | SUBCUTANEOUS | 0 refills | Status: DC
  Filled 2024-09-22: qty 2, 28d supply, fill #0

## 2024-10-09 ENCOUNTER — Other Ambulatory Visit (HOSPITAL_COMMUNITY): Payer: Self-pay

## 2024-10-10 ENCOUNTER — Other Ambulatory Visit (HOSPITAL_COMMUNITY): Payer: Self-pay

## 2024-10-10 ENCOUNTER — Other Ambulatory Visit: Payer: Self-pay

## 2024-10-10 ENCOUNTER — Other Ambulatory Visit: Payer: Self-pay | Admitting: Family Medicine

## 2024-10-10 DIAGNOSIS — E782 Mixed hyperlipidemia: Secondary | ICD-10-CM

## 2024-10-10 MED ORDER — ATORVASTATIN CALCIUM 10 MG PO TABS
10.0000 mg | ORAL_TABLET | Freq: Every day | ORAL | 0 refills | Status: DC
  Filled 2024-10-10: qty 90, 90d supply, fill #0

## 2024-10-16 ENCOUNTER — Other Ambulatory Visit (HOSPITAL_COMMUNITY): Payer: Self-pay

## 2024-10-18 ENCOUNTER — Other Ambulatory Visit (HOSPITAL_COMMUNITY): Payer: Self-pay

## 2024-10-28 ENCOUNTER — Other Ambulatory Visit (HOSPITAL_COMMUNITY): Payer: Self-pay | Admitting: Family Medicine

## 2024-10-28 DIAGNOSIS — Z1231 Encounter for screening mammogram for malignant neoplasm of breast: Secondary | ICD-10-CM

## 2024-11-01 ENCOUNTER — Other Ambulatory Visit: Payer: Self-pay | Admitting: Family Medicine

## 2024-11-03 ENCOUNTER — Encounter: Payer: Self-pay | Admitting: *Deleted

## 2024-11-03 ENCOUNTER — Other Ambulatory Visit (HOSPITAL_COMMUNITY): Payer: Self-pay

## 2024-11-03 MED ORDER — MOUNJARO 5 MG/0.5ML ~~LOC~~ SOAJ
5.0000 mg | SUBCUTANEOUS | 0 refills | Status: DC
  Filled 2024-11-03 – 2024-11-08 (×4): qty 2, 28d supply, fill #0

## 2024-11-04 ENCOUNTER — Other Ambulatory Visit (HOSPITAL_COMMUNITY): Payer: Self-pay

## 2024-11-04 ENCOUNTER — Other Ambulatory Visit: Payer: Self-pay

## 2024-11-07 ENCOUNTER — Other Ambulatory Visit (HOSPITAL_COMMUNITY): Payer: Self-pay

## 2024-11-08 ENCOUNTER — Other Ambulatory Visit (HOSPITAL_COMMUNITY): Payer: Self-pay

## 2024-11-10 ENCOUNTER — Other Ambulatory Visit (HOSPITAL_COMMUNITY): Payer: Self-pay

## 2024-11-14 ENCOUNTER — Other Ambulatory Visit: Payer: Self-pay | Admitting: Family

## 2024-11-17 ENCOUNTER — Other Ambulatory Visit (HOSPITAL_COMMUNITY): Payer: Self-pay

## 2024-11-17 MED ORDER — ESZOPICLONE 3 MG PO TABS
3.0000 mg | ORAL_TABLET | Freq: Every day | ORAL | 2 refills | Status: AC
  Filled 2024-11-17: qty 30, 30d supply, fill #0
  Filled 2024-12-15 – 2024-12-20 (×3): qty 30, 30d supply, fill #1

## 2024-11-17 NOTE — Telephone Encounter (Signed)
 Requesting: eszopiclone  3mg   Contract: 01/31/24 UDS: 01/31/24 Last Visit: 08/28/24 w/ Antonio Next Visit: 01/08/25 Last Refill: 08/07/24 #30 and 0RF   Please Advise

## 2024-12-03 ENCOUNTER — Other Ambulatory Visit: Payer: Self-pay | Admitting: Family Medicine

## 2024-12-03 ENCOUNTER — Other Ambulatory Visit (HOSPITAL_COMMUNITY): Payer: Self-pay

## 2024-12-03 ENCOUNTER — Other Ambulatory Visit: Payer: Self-pay

## 2024-12-03 MED ORDER — MOUNJARO 5 MG/0.5ML ~~LOC~~ SOAJ
5.0000 mg | SUBCUTANEOUS | 1 refills | Status: AC
  Filled 2024-12-03: qty 2, 28d supply, fill #0
  Filled 2024-12-31: qty 2, 28d supply, fill #1

## 2024-12-15 ENCOUNTER — Ambulatory Visit (HOSPITAL_COMMUNITY)

## 2024-12-16 ENCOUNTER — Other Ambulatory Visit (HOSPITAL_COMMUNITY): Payer: Self-pay

## 2024-12-17 ENCOUNTER — Ambulatory Visit (HOSPITAL_COMMUNITY)
Admission: RE | Admit: 2024-12-17 | Discharge: 2024-12-17 | Disposition: A | Discharge status: Home / Self Care | Source: Ambulatory Visit | Attending: Family Medicine | Admitting: Family Medicine

## 2024-12-17 ENCOUNTER — Encounter (HOSPITAL_COMMUNITY): Payer: Self-pay

## 2024-12-17 DIAGNOSIS — Z1231 Encounter for screening mammogram for malignant neoplasm of breast: Secondary | ICD-10-CM | POA: Insufficient documentation

## 2024-12-20 ENCOUNTER — Other Ambulatory Visit (HOSPITAL_COMMUNITY): Payer: Self-pay

## 2024-12-24 ENCOUNTER — Telehealth: Payer: Self-pay | Admitting: Family Medicine

## 2024-12-24 NOTE — Telephone Encounter (Unsigned)
 Copied from CRM #[id number]. Topic: Clinical - Refused Triage >> Dec 24, 2024  1:26 PM Viola F wrote: Patient/caller voiced complaints of numbness/burning in feet and joint pain. Declined transfer to triage. Her appt with Dr. Domenica was cancelled for 01/08/25. Since she already has the day off she just wanted to reschedule with another provider. She's now scheduled with Dr. Watt for 01/08/25  If patient is unestablished, route message to [facility] Nurse Triage If patient is established, route message to the appropriate department clinical pool

## 2024-12-31 ENCOUNTER — Ambulatory Visit (HOSPITAL_COMMUNITY): Payer: Commercial Managed Care - PPO

## 2024-12-31 ENCOUNTER — Ambulatory Visit: Admitting: Family Medicine

## 2024-12-31 ENCOUNTER — Other Ambulatory Visit: Payer: Self-pay

## 2024-12-31 ENCOUNTER — Ambulatory Visit: Admitting: Medical

## 2025-01-08 ENCOUNTER — Ambulatory Visit: Admitting: Family Medicine

## 2025-01-08 ENCOUNTER — Encounter: Payer: Self-pay | Admitting: Family Medicine

## 2025-01-08 ENCOUNTER — Ambulatory Visit
Admission: RE | Admit: 2025-01-08 | Discharge: 2025-01-08 | Disposition: A | Source: Ambulatory Visit | Attending: Family Medicine | Admitting: Family Medicine

## 2025-01-08 VITALS — BP 130/80 | HR 79 | Temp 98.0°F | Resp 16 | Ht 64.0 in | Wt 136.0 lb

## 2025-01-08 DIAGNOSIS — G629 Polyneuropathy, unspecified: Secondary | ICD-10-CM

## 2025-01-08 LAB — MAGNESIUM: Magnesium: 2.2 mg/dL (ref 1.5–2.5)

## 2025-01-08 LAB — SEDIMENTATION RATE: Sed Rate: 14 mm/h (ref 0–30)

## 2025-01-08 LAB — CBC
HCT: 38.1 % (ref 36.0–46.0)
Hemoglobin: 13 g/dL (ref 12.0–15.0)
MCHC: 34.2 g/dL (ref 30.0–36.0)
MCV: 82 fl (ref 78.0–100.0)
Platelets: 378 10*3/uL (ref 150.0–400.0)
RBC: 4.64 Mil/uL (ref 3.87–5.11)
RDW: 13.3 % (ref 11.5–15.5)
WBC: 6.5 10*3/uL (ref 4.0–10.5)

## 2025-01-08 LAB — BASIC METABOLIC PANEL WITH GFR
BUN: 11 mg/dL (ref 6–23)
CO2: 29 meq/L (ref 19–32)
Calcium: 9.2 mg/dL (ref 8.4–10.5)
Chloride: 102 meq/L (ref 96–112)
Creatinine, Ser: 0.74 mg/dL (ref 0.40–1.20)
GFR: 84.29 mL/min
Glucose, Bld: 98 mg/dL (ref 70–99)
Potassium: 4 meq/L (ref 3.5–5.1)
Sodium: 139 meq/L (ref 135–145)

## 2025-01-08 LAB — VITAMIN B12: Vitamin B-12: 563 pg/mL (ref 211–911)

## 2025-01-08 LAB — C-REACTIVE PROTEIN: CRP: <0.5 mg/dL — ABNORMAL LOW (ref 1.0–20.0)

## 2025-01-08 LAB — FOLATE: Folate: 14.3 ng/mL

## 2025-01-08 LAB — TSH: TSH: 0.9 u[IU]/mL (ref 0.35–5.50)

## 2025-01-08 LAB — HEMOGLOBIN A1C: Hgb A1c MFr Bld: 5.6 % (ref 4.6–6.5)

## 2025-01-08 NOTE — Patient Instructions (Signed)
 It was good to see you today- we will hope for spring weather soon! I will be in touch with your labs asap Ok to go by [facility] at your convenience for C spine films

## 2025-01-09 ENCOUNTER — Encounter: Payer: Self-pay | Admitting: Student

## 2025-01-09 ENCOUNTER — Ambulatory Visit: Payer: Self-pay | Admitting: Family Medicine

## 2025-01-09 ENCOUNTER — Other Ambulatory Visit (HOSPITAL_COMMUNITY): Payer: Self-pay

## 2025-01-09 ENCOUNTER — Ambulatory Visit: Admitting: Student

## 2025-01-09 VITALS — BP 101/68 | HR 69 | Ht 64.0 in | Wt 133.5 lb

## 2025-01-09 DIAGNOSIS — I471 Supraventricular tachycardia, unspecified: Secondary | ICD-10-CM

## 2025-01-09 DIAGNOSIS — E782 Mixed hyperlipidemia: Secondary | ICD-10-CM

## 2025-01-09 DIAGNOSIS — I4711 Inappropriate sinus tachycardia, so stated: Secondary | ICD-10-CM

## 2025-01-09 DIAGNOSIS — I351 Nonrheumatic aortic (valve) insufficiency: Secondary | ICD-10-CM

## 2025-01-09 LAB — IRON,TIBC AND FERRITIN PANEL
%SAT: 27 % (ref 16–45)
Ferritin: 247 ng/mL (ref 16–288)
Iron: 78 ug/dL (ref 45–160)
TIBC: 291 ug/dL (ref 250–450)

## 2025-01-09 MED ORDER — ATORVASTATIN CALCIUM 10 MG PO TABS
10.0000 mg | ORAL_TABLET | Freq: Every day | ORAL | 3 refills | Status: AC
  Filled 2025-01-09: qty 90, 90d supply, fill #0

## 2025-01-09 MED ORDER — IVABRADINE HCL 5 MG PO TABS
5.0000 mg | ORAL_TABLET | Freq: Two times a day (BID) | ORAL | 3 refills | Status: AC
  Filled 2025-01-09: qty 180, 90d supply, fill #0

## 2025-01-09 NOTE — Progress Notes (Signed)
 "  Cardiology Office Note    Date:  01/09/2025  ID:  [name] Jensen, DOB Nov 05, 1956, MRN [id number] Cardiologist: Alvan Carrier, MD { : History of Present Illness:    [name] Jensen is a 69 y.o. female with past medical history of SVT/inappropriate sinus tachycardia (underwent prior ablation but did have inappropriate sinus tachycardia in the interim), no evidence of CAD by Coronary CTA in 01/2018, gastroparesis, HLD and aortic regurgitation who presents to the office today for annual follow-up.  She was last examined by Dr. Alvan in 01/2024 and most recent echocardiogram had shown a low-normal EF of 50 to 55% with grade 1 diastolic dysfunction, normal RV function and mild to moderate aortic valve regurgitation. She had been on Bisoprolol  2.5 mg daily for palpitations but titration of this was limited given orthostatic symptoms and hypotension at times. Was recommended to try adding Corlanor 5 mg twice daily for inappropriate sinus tachycardia.  In talking with the patient today, she reports things have overall been stable from a cardiac perspective since her last office visit. She feels that Corlanor has been working great for her tachycardia as she previously had hypotension with beta-blockers in the past and was intolerant to calcium  channel blockers. Denies any specific palpitations. No recent chest pain, dyspnea on exertion with routine activities, orthopnea, PND or pitting edema. She does wear compression stockings most days. She recently had COVID and did experience more dizziness and hypotension at that time but symptoms have now improved. She has also noticed tightness and tingling along her feet and is undergoing work for this by her PCP.   Studies Reviewed:   EKG: EKG is ordered today and demonstrates:   EKG Interpretation Date/Time:  Friday January 09 2025 12:26:41 EST Ventricular Rate:  68 PR Interval:  168 QRS Duration:  66 QT Interval:  432 QTC Calculation: 459 R  Axis:   74  Text Interpretation: Normal sinus rhythm When compared with ECG of 09-Jan-2024 14:01, T wave inversion no longer evident in Inferior leads Confirmed by Johnson Grate ([id number]) on 01/09/2025 12:31:01 PM       Coronary CTA: 01/2018 IMPRESSION: 1. Coronary calcium  score of 0. This was 0 percentile for age and sex matched control.   2. Normal coronary origin with right dominance.   3. No evidence of CAD.   4. Mildly dilated pulmonary artery measuring 33 mm suggestive of pulmonary hypertension.  Echocardiogram: 12/2023 IMPRESSIONS     1. Left ventricular ejection fraction, by estimation, is 50 to 55%. The  left ventricle has low normal function. The left ventricle demonstrates  global hypokinesis. Left ventricular diastolic parameters are consistent  with Grade I diastolic dysfunction  (impaired relaxation).   2. Right ventricular systolic function is normal. The right ventricular  size is normal. Tricuspid regurgitation signal is inadequate for assessing  PA pressure.   3. The mitral valve is grossly normal. Trivial mitral valve  regurgitation.   4. The aortic valve is tricuspid. Aortic valve regurgitation is mild to  moderate. No aortic stenosis is present.   5. The inferior vena cava is normal in size with greater than 50%  respiratory variability, suggesting right atrial pressure of 3 mmHg.   Comparison(s): Prior images reviewed side by side. LVEF 50-55% range at  this time with normal LV chamber size. Aortic regurgitation is less well  defined on the current study, mild to moderate.    Physical Exam:   VS:  BP 101/68   Pulse 69   Ht  5' 4 (1.626 m)   Wt 133 lb 8 oz (60.6 kg)   LMP  (LMP Unknown)   SpO2 97%   BMI 22.92 kg/m    Wt Readings from Last 3 Encounters:  01/09/25 133 lb 8 oz (60.6 kg)  01/08/25 136 lb (61.7 kg)  08/28/24 135 lb 6.4 oz (61.4 kg)     GEN: Pleasant female appearing in no acute distress NECK: No JVD; No carotid  bruits CARDIAC: RRR, soft diastolic murmur along sternal border.  RESPIRATORY:  Clear to auscultation without rales, wheezing or rhonchi  ABDOMEN: Appears non-distended. No obvious abdominal masses. EXTREMITIES: No pitting edema. Compression stockings in place. Distal pedal pulses are 2+ bilaterally.   Assessment and Plan:   1. History of SVT (supraventricular tachycardia)/ Inappropriate sinus tachycardia - She previously underwent SVT ablation but did have inappropriate sinus tachycardia afterwards. Given intolerances to beta-blocker therapy and calcium  channel blocker therapy, she was started on Corlanor 5 mg twice daily last year and has overall tolerated well. Continue current medical therapy with Corlanor 5 mg twice daily.  2. Aortic valve insufficiency, etiology of cardiac valve disease unspecified - This was mild to moderate by most recent echocardiogram in 12/2023. She is scheduled for a follow-up echocardiogram next week and will follow-up on the results once available.  3. Hyperlipidemia, mixed - LDL was at 128 when checked in 08/2024 but she had temporarily stopped statin therapy due to having myalgias and unsure of the culprit. Her myalgias did not change with holding this and she has since restarted Atorvastatin  10 mg daily. Will recheck an FLP and LFT's.  Signed, Laymon CHRISTELLA Qua, PA-C   "

## 2025-01-09 NOTE — Patient Instructions (Signed)
 Medication Instructions:   Continue current medication regimen.   *If you need a refill on your cardiac medications before your next appointment, please call your pharmacy*  Lab Work:  FLP and LFT's.   If you have labs (blood work) drawn today and your tests are completely normal, you will receive your results only by: MyChart Message (if you have MyChart) OR A paper copy in the mail If you have any lab test that is abnormal or we need to change your treatment, we will call you to review the results.  Follow-Up: At [facility], you and your health needs are our priority.  As part of our continuing mission to provide you with exceptional heart care, our providers are all part of one team.  This team includes your primary Cardiologist (physician) and Advanced Practice Providers or APPs (Physician Assistants and Nurse Practitioners) who all work together to provide you with the care you need, when you need it.  Your next appointment:   1 year(s)  Provider:   You may see Alvan Carrier, MD or one of the following Advanced Practice Providers on your designated Care Team:   Laymon Qua, PA-C  Scotesia Anmoore, [place] Olivia Pavy, [place]     We recommend signing up for the patient portal called MyChart.  Sign up information is provided on this After Visit Summary.  MyChart is used to connect with patients for Virtual Visits (Telemedicine).  Patients are able to view lab/test results, encounter notes, upcoming appointments, etc.  Non-urgent messages can be sent to your provider as well.   To learn more about what you can do with MyChart, go to [email or web address].

## 2025-01-15 ENCOUNTER — Ambulatory Visit (HOSPITAL_COMMUNITY)
